# Patient Record
Sex: Female | Born: 1935 | Race: White | Hispanic: No | Marital: Single | Smoking: Never smoker
Health system: Southern US, Community
[De-identification: ages and names within clinical notes are randomized; demographics above are authoritative.]

## PROBLEM LIST (undated history)

## (undated) DIAGNOSIS — E079 Disorder of thyroid, unspecified: Secondary | ICD-10-CM

## (undated) DIAGNOSIS — C801 Malignant (primary) neoplasm, unspecified: Secondary | ICD-10-CM

## (undated) DIAGNOSIS — M5136 Other intervertebral disc degeneration, lumbar region: Secondary | ICD-10-CM

## (undated) DIAGNOSIS — I2510 Atherosclerotic heart disease of native coronary artery without angina pectoris: Secondary | ICD-10-CM

## (undated) DIAGNOSIS — N183 Chronic kidney disease, stage 3 unspecified: Secondary | ICD-10-CM

## (undated) DIAGNOSIS — I509 Heart failure, unspecified: Secondary | ICD-10-CM

## (undated) DIAGNOSIS — K219 Gastro-esophageal reflux disease without esophagitis: Secondary | ICD-10-CM

## (undated) DIAGNOSIS — H9190 Unspecified hearing loss, unspecified ear: Secondary | ICD-10-CM

## (undated) DIAGNOSIS — E785 Hyperlipidemia, unspecified: Secondary | ICD-10-CM

## (undated) DIAGNOSIS — N189 Chronic kidney disease, unspecified: Secondary | ICD-10-CM

## (undated) DIAGNOSIS — I10 Essential (primary) hypertension: Secondary | ICD-10-CM

## (undated) DIAGNOSIS — M48061 Spinal stenosis, lumbar region without neurogenic claudication: Secondary | ICD-10-CM

## (undated) DIAGNOSIS — J40 Bronchitis, not specified as acute or chronic: Secondary | ICD-10-CM

## (undated) DIAGNOSIS — I499 Cardiac arrhythmia, unspecified: Secondary | ICD-10-CM

## (undated) DIAGNOSIS — I700 Atherosclerosis of aorta: Secondary | ICD-10-CM

## (undated) DIAGNOSIS — D34 Benign neoplasm of thyroid gland: Secondary | ICD-10-CM

## (undated) DIAGNOSIS — C8590 Non-Hodgkin lymphoma, unspecified, unspecified site: Secondary | ICD-10-CM

## (undated) DIAGNOSIS — R600 Localized edema: Secondary | ICD-10-CM

## (undated) DIAGNOSIS — J449 Chronic obstructive pulmonary disease, unspecified: Secondary | ICD-10-CM

## (undated) DIAGNOSIS — F419 Anxiety disorder, unspecified: Secondary | ICD-10-CM

## (undated) DIAGNOSIS — I4891 Unspecified atrial fibrillation: Secondary | ICD-10-CM

## (undated) DIAGNOSIS — F32A Depression, unspecified: Secondary | ICD-10-CM

## (undated) DIAGNOSIS — R32 Unspecified urinary incontinence: Secondary | ICD-10-CM

## (undated) DIAGNOSIS — R413 Other amnesia: Secondary | ICD-10-CM

## (undated) DIAGNOSIS — E039 Hypothyroidism, unspecified: Secondary | ICD-10-CM

## (undated) DIAGNOSIS — M51369 Other intervertebral disc degeneration, lumbar region without mention of lumbar back pain or lower extremity pain: Secondary | ICD-10-CM

## (undated) DIAGNOSIS — M1990 Unspecified osteoarthritis, unspecified site: Secondary | ICD-10-CM

## (undated) HISTORY — PX: ABDOMINAL HYSTERECTOMY: SHX81

## (undated) HISTORY — PX: TOTAL ABDOMINAL HYSTERECTOMY: SHX209

## (undated) HISTORY — PX: CORONARY STENT PLACEMENT: SHX1402

## (undated) HISTORY — PX: VEIN SURGERY: SHX48

## (undated) HISTORY — PX: CORONARY ANGIOPLASTY: SHX604

---

## 2000-02-26 DIAGNOSIS — C8590 Non-Hodgkin lymphoma, unspecified, unspecified site: Secondary | ICD-10-CM

## 2000-02-26 HISTORY — DX: Non-Hodgkin lymphoma, unspecified, unspecified site: C85.90

## 2003-12-12 ENCOUNTER — Ambulatory Visit: Payer: Self-pay | Admitting: Oncology

## 2003-12-27 ENCOUNTER — Ambulatory Visit: Payer: Self-pay | Admitting: Oncology

## 2004-03-02 ENCOUNTER — Ambulatory Visit: Payer: Self-pay | Admitting: Oncology

## 2004-03-14 ENCOUNTER — Ambulatory Visit: Payer: Self-pay | Admitting: Oncology

## 2004-06-13 ENCOUNTER — Ambulatory Visit: Payer: Self-pay | Admitting: Oncology

## 2004-06-25 ENCOUNTER — Ambulatory Visit: Payer: Self-pay | Admitting: Oncology

## 2004-09-12 ENCOUNTER — Ambulatory Visit: Payer: Self-pay | Admitting: Oncology

## 2004-09-25 ENCOUNTER — Ambulatory Visit: Payer: Self-pay | Admitting: Oncology

## 2005-02-22 ENCOUNTER — Ambulatory Visit: Payer: Self-pay | Admitting: Oncology

## 2005-03-05 ENCOUNTER — Ambulatory Visit: Payer: Self-pay | Admitting: Oncology

## 2005-03-28 ENCOUNTER — Ambulatory Visit: Payer: Self-pay | Admitting: Oncology

## 2005-09-02 ENCOUNTER — Ambulatory Visit: Payer: Self-pay | Admitting: Oncology

## 2005-09-25 ENCOUNTER — Ambulatory Visit: Payer: Self-pay | Admitting: Oncology

## 2006-02-21 ENCOUNTER — Ambulatory Visit: Payer: Self-pay | Admitting: Oncology

## 2006-03-04 ENCOUNTER — Ambulatory Visit: Payer: Self-pay | Admitting: Oncology

## 2006-03-28 ENCOUNTER — Ambulatory Visit: Payer: Self-pay | Admitting: Oncology

## 2006-08-26 ENCOUNTER — Ambulatory Visit: Payer: Self-pay | Admitting: Oncology

## 2006-09-09 ENCOUNTER — Ambulatory Visit: Payer: Self-pay | Admitting: Oncology

## 2006-09-26 ENCOUNTER — Ambulatory Visit: Payer: Self-pay | Admitting: Oncology

## 2006-10-21 ENCOUNTER — Ambulatory Visit: Payer: Self-pay | Admitting: Gastroenterology

## 2007-02-26 ENCOUNTER — Ambulatory Visit: Payer: Self-pay | Admitting: Oncology

## 2007-03-13 ENCOUNTER — Ambulatory Visit: Payer: Self-pay | Admitting: Internal Medicine

## 2007-03-18 ENCOUNTER — Ambulatory Visit: Payer: Self-pay | Admitting: Oncology

## 2007-03-29 ENCOUNTER — Ambulatory Visit: Payer: Self-pay | Admitting: Oncology

## 2007-05-12 ENCOUNTER — Ambulatory Visit: Payer: Self-pay | Admitting: Oncology

## 2007-05-13 ENCOUNTER — Ambulatory Visit: Payer: Self-pay | Admitting: Internal Medicine

## 2007-05-27 ENCOUNTER — Ambulatory Visit: Payer: Self-pay | Admitting: Internal Medicine

## 2007-06-26 ENCOUNTER — Ambulatory Visit: Payer: Self-pay | Admitting: Oncology

## 2007-08-17 ENCOUNTER — Ambulatory Visit: Payer: Self-pay | Admitting: Family Medicine

## 2007-09-26 ENCOUNTER — Ambulatory Visit: Payer: Self-pay | Admitting: Oncology

## 2007-10-02 ENCOUNTER — Ambulatory Visit: Payer: Self-pay | Admitting: Oncology

## 2007-10-27 ENCOUNTER — Ambulatory Visit: Payer: Self-pay | Admitting: Oncology

## 2008-03-28 ENCOUNTER — Ambulatory Visit: Payer: Self-pay | Admitting: Oncology

## 2008-04-04 ENCOUNTER — Ambulatory Visit: Payer: Self-pay | Admitting: Oncology

## 2008-04-13 ENCOUNTER — Ambulatory Visit: Payer: Self-pay | Admitting: Family Medicine

## 2008-04-25 ENCOUNTER — Ambulatory Visit: Payer: Self-pay | Admitting: Oncology

## 2008-05-17 ENCOUNTER — Inpatient Hospital Stay: Payer: Self-pay | Admitting: Internal Medicine

## 2008-06-03 ENCOUNTER — Inpatient Hospital Stay: Payer: Self-pay | Admitting: Internal Medicine

## 2008-06-03 DIAGNOSIS — I2510 Atherosclerotic heart disease of native coronary artery without angina pectoris: Secondary | ICD-10-CM

## 2008-06-03 HISTORY — DX: Atherosclerotic heart disease of native coronary artery without angina pectoris: I25.10

## 2008-06-03 HISTORY — PX: CORONARY ANGIOPLASTY WITH STENT PLACEMENT: SHX49

## 2008-09-25 ENCOUNTER — Ambulatory Visit: Payer: Self-pay | Admitting: Oncology

## 2008-10-03 ENCOUNTER — Ambulatory Visit: Payer: Self-pay | Admitting: Oncology

## 2008-10-26 ENCOUNTER — Ambulatory Visit: Payer: Self-pay | Admitting: Oncology

## 2009-10-02 ENCOUNTER — Ambulatory Visit: Payer: Self-pay | Admitting: Family Medicine

## 2010-02-08 ENCOUNTER — Inpatient Hospital Stay: Payer: Self-pay | Admitting: Internal Medicine

## 2010-02-12 HISTORY — PX: LEFT HEART CATH AND CORONARY ANGIOGRAPHY: CATH118249

## 2010-02-16 ENCOUNTER — Ambulatory Visit: Payer: Self-pay | Admitting: Oncology

## 2010-02-25 ENCOUNTER — Ambulatory Visit: Payer: Self-pay | Admitting: Oncology

## 2010-03-28 ENCOUNTER — Ambulatory Visit: Payer: Self-pay | Admitting: Oncology

## 2010-06-19 ENCOUNTER — Ambulatory Visit: Payer: Self-pay | Admitting: Internal Medicine

## 2012-08-02 LAB — URINALYSIS, COMPLETE
Bilirubin,UR: NEGATIVE
Glucose,UR: NEGATIVE mg/dL (ref 0–75)
Ketone: NEGATIVE
Nitrite: POSITIVE
Ph: 6 (ref 4.5–8.0)
RBC,UR: 99 /HPF (ref 0–5)
Squamous Epithelial: NONE SEEN

## 2012-08-02 LAB — CBC WITH DIFFERENTIAL/PLATELET
Basophil %: 0.5 %
Eosinophil #: 0.1 10*3/uL (ref 0.0–0.7)
Eosinophil %: 0.8 %
Lymphocyte %: 15.5 %
MCHC: 33.9 g/dL (ref 32.0–36.0)
MCV: 92 fL (ref 80–100)
Monocyte #: 1.3 x10 3/mm — ABNORMAL HIGH (ref 0.2–0.9)
Monocyte %: 11.7 %
Neutrophil #: 7.9 10*3/uL — ABNORMAL HIGH (ref 1.4–6.5)
Neutrophil %: 71.5 %
RBC: 4 10*6/uL (ref 3.80–5.20)
RDW: 14.3 % (ref 11.5–14.5)
WBC: 11.1 10*3/uL — ABNORMAL HIGH (ref 3.6–11.0)

## 2012-08-02 LAB — COMPREHENSIVE METABOLIC PANEL
Alkaline Phosphatase: 78 U/L (ref 50–136)
Bilirubin,Total: 0.5 mg/dL (ref 0.2–1.0)
Calcium, Total: 8.7 mg/dL (ref 8.5–10.1)
Chloride: 105 mmol/L (ref 98–107)
Glucose: 83 mg/dL (ref 65–99)
Osmolality: 277 (ref 275–301)
Potassium: 4.3 mmol/L (ref 3.5–5.1)
Sodium: 138 mmol/L (ref 136–145)

## 2012-08-02 LAB — TROPONIN I: Troponin-I: <0.02 ng/mL

## 2012-08-03 ENCOUNTER — Inpatient Hospital Stay: Payer: Self-pay | Admitting: Internal Medicine

## 2012-08-03 LAB — TROPONIN I: Troponin-I: <0.02 ng/mL

## 2012-08-04 LAB — BASIC METABOLIC PANEL
BUN: 17 mg/dL (ref 7–18)
Calcium, Total: 8.5 mg/dL (ref 8.5–10.1)
Chloride: 108 mmol/L — ABNORMAL HIGH (ref 98–107)
Co2: 24 mmol/L (ref 21–32)
Creatinine: 1.35 mg/dL — ABNORMAL HIGH (ref 0.60–1.30)
EGFR (African American): 44 — ABNORMAL LOW
EGFR (Non-African Amer.): 38 — ABNORMAL LOW
Osmolality: 280 (ref 275–301)
Potassium: 3.9 mmol/L (ref 3.5–5.1)

## 2012-08-04 LAB — CBC WITH DIFFERENTIAL/PLATELET
Eosinophil #: 0.3 10*3/uL (ref 0.0–0.7)
Eosinophil %: 3.5 %
HCT: 35 % (ref 35.0–47.0)
Lymphocyte #: 1.3 10*3/uL (ref 1.0–3.6)
MCH: 30.8 pg (ref 26.0–34.0)
MCV: 92 fL (ref 80–100)
Monocyte %: 12.4 %
Neutrophil #: 5.1 10*3/uL (ref 1.4–6.5)
Neutrophil %: 66.4 %
Platelet: 248 10*3/uL (ref 150–440)
RBC: 3.79 10*6/uL — ABNORMAL LOW (ref 3.80–5.20)
RDW: 13.9 % (ref 11.5–14.5)
WBC: 7.7 10*3/uL (ref 3.6–11.0)

## 2012-08-04 LAB — URINE CULTURE

## 2012-08-05 LAB — BASIC METABOLIC PANEL
Anion Gap: 6 — ABNORMAL LOW (ref 7–16)
BUN: 19 mg/dL — ABNORMAL HIGH (ref 7–18)
Calcium, Total: 8.9 mg/dL (ref 8.5–10.1)
Creatinine: 1.43 mg/dL — ABNORMAL HIGH (ref 0.60–1.30)
EGFR (Non-African Amer.): 35 — ABNORMAL LOW
Glucose: 81 mg/dL (ref 65–99)
Potassium: 3.6 mmol/L (ref 3.5–5.1)
Sodium: 139 mmol/L (ref 136–145)

## 2012-08-08 LAB — CULTURE, BLOOD (SINGLE)

## 2012-10-07 ENCOUNTER — Ambulatory Visit: Payer: Self-pay | Admitting: Internal Medicine

## 2012-10-12 ENCOUNTER — Emergency Department: Payer: Self-pay | Admitting: Emergency Medicine

## 2012-10-12 LAB — COMPREHENSIVE METABOLIC PANEL
Albumin: 3.8 g/dL (ref 3.4–5.0)
Anion Gap: 3 — ABNORMAL LOW (ref 7–16)
Chloride: 103 mmol/L (ref 98–107)
Co2: 30 mmol/L (ref 21–32)
EGFR (African American): 43 — ABNORMAL LOW
EGFR (Non-African Amer.): 37 — ABNORMAL LOW
Glucose: 105 mg/dL — ABNORMAL HIGH (ref 65–99)
Osmolality: 275 (ref 275–301)
Potassium: 3.7 mmol/L (ref 3.5–5.1)
Total Protein: 7.5 g/dL (ref 6.4–8.2)

## 2012-10-12 LAB — CBC
HCT: 41 % (ref 35.0–47.0)
HGB: 14.4 g/dL (ref 12.0–16.0)
MCH: 31.8 pg (ref 26.0–34.0)
MCHC: 35.2 g/dL (ref 32.0–36.0)
MCV: 90 fL (ref 80–100)
Platelet: 238 10*3/uL (ref 150–440)
RDW: 14.6 % — ABNORMAL HIGH (ref 11.5–14.5)
WBC: 8 10*3/uL (ref 3.6–11.0)

## 2013-10-06 ENCOUNTER — Ambulatory Visit: Payer: Self-pay | Admitting: Internal Medicine

## 2013-10-08 ENCOUNTER — Ambulatory Visit: Payer: Self-pay | Admitting: Internal Medicine

## 2013-11-03 ENCOUNTER — Ambulatory Visit: Payer: Self-pay | Admitting: Internal Medicine

## 2013-12-06 ENCOUNTER — Emergency Department: Payer: Self-pay | Admitting: Emergency Medicine

## 2013-12-06 LAB — CBC
HCT: 41.2 % (ref 35.0–47.0)
HGB: 13.3 g/dL (ref 12.0–16.0)
MCH: 31.5 pg (ref 26.0–34.0)
MCHC: 32.4 g/dL (ref 32.0–36.0)
MCV: 97 fL (ref 80–100)
Platelet: 250 10*3/uL (ref 150–440)
RBC: 4.23 10*6/uL (ref 3.80–5.20)
RDW: 14.2 % (ref 11.5–14.5)
WBC: 9.6 10*3/uL (ref 3.6–11.0)

## 2013-12-06 LAB — COMPREHENSIVE METABOLIC PANEL
ALBUMIN: 3.9 g/dL (ref 3.4–5.0)
Alkaline Phosphatase: 71 U/L
Anion Gap: 8 (ref 7–16)
BUN: 15 mg/dL (ref 7–18)
Bilirubin,Total: 0.6 mg/dL (ref 0.2–1.0)
Calcium, Total: 8.9 mg/dL (ref 8.5–10.1)
Chloride: 102 mmol/L (ref 98–107)
Co2: 27 mmol/L (ref 21–32)
Creatinine: 1.48 mg/dL — ABNORMAL HIGH (ref 0.60–1.30)
EGFR (African American): 44 — ABNORMAL LOW
GFR CALC NON AF AMER: 36 — AB
GLUCOSE: 107 mg/dL — AB (ref 65–99)
Osmolality: 275 (ref 275–301)
Potassium: 4 mmol/L (ref 3.5–5.1)
SGOT(AST): 19 U/L (ref 15–37)
SGPT (ALT): 30 U/L
Sodium: 137 mmol/L (ref 136–145)
Total Protein: 7.4 g/dL (ref 6.4–8.2)

## 2013-12-06 LAB — URINALYSIS, COMPLETE
BACTERIA: NONE SEEN
BILIRUBIN, UR: NEGATIVE
Blood: NEGATIVE
Glucose,UR: NEGATIVE mg/dL (ref 0–75)
Ketone: NEGATIVE
Leukocyte Esterase: NEGATIVE
Nitrite: NEGATIVE
Ph: 5 (ref 4.5–8.0)
Protein: NEGATIVE
RBC,UR: 1 /HPF (ref 0–5)
Specific Gravity: 1.002 (ref 1.003–1.030)
Squamous Epithelial: NONE SEEN
WBC UR: 1 /HPF (ref 0–5)

## 2013-12-13 DIAGNOSIS — M5116 Intervertebral disc disorders with radiculopathy, lumbar region: Secondary | ICD-10-CM | POA: Insufficient documentation

## 2013-12-13 DIAGNOSIS — M5416 Radiculopathy, lumbar region: Secondary | ICD-10-CM | POA: Insufficient documentation

## 2013-12-13 DIAGNOSIS — M48061 Spinal stenosis, lumbar region without neurogenic claudication: Secondary | ICD-10-CM | POA: Insufficient documentation

## 2013-12-24 ENCOUNTER — Ambulatory Visit: Payer: Self-pay | Admitting: Gastroenterology

## 2014-01-18 ENCOUNTER — Ambulatory Visit: Payer: Self-pay | Admitting: Oncology

## 2014-01-24 DIAGNOSIS — J209 Acute bronchitis, unspecified: Secondary | ICD-10-CM | POA: Insufficient documentation

## 2014-01-24 DIAGNOSIS — J208 Acute bronchitis due to other specified organisms: Secondary | ICD-10-CM | POA: Insufficient documentation

## 2014-05-31 DIAGNOSIS — I509 Heart failure, unspecified: Secondary | ICD-10-CM

## 2014-05-31 HISTORY — DX: Heart failure, unspecified: I50.9

## 2014-06-17 NOTE — Discharge Summary (Signed)
PATIENT NAME:  [name], [name] MR#:  [id number] DATE OF BIRTH:  23-Oct-1932  DATE OF ADMISSION:  08/03/2012 DATE OF DISCHARGE:  08/05/2012  DIAGNOSES AT TIME OF DISCHARGE:  1. Altered mental status secondary to a urinary tract infection.  2. Chest pain.  3. Depression.  4. Lymphoma.  5. Acute renal insufficiency secondary to dehydration.   CHIEF COMPLAINT: Chest pain, frequency of urination, back pain, confusion, difficulty sleeping.   HISTORY OF PRESENT ILLNESS: [name] is a 81 year old female who reported to the ER after she complained of inability to sleep associated with back pain, frequency of urination and chest pain. She was also noted to be febrile and confused and did not appear to be herself and reportedly was dropping things in the lobby and was disoriented. The patient states that she had been urinating a lot and had recently stopped her diuretics. The urinalysis was consistent with a urinary tract infection, and she was admitted for IV hydration and IV antibiotics.   PAST MEDICAL HISTORY: Significant for non-Hodgkin's lymphoma, hypothyroidism.   SOCIAL HISTORY: She is a nonsmoker. No history of alcohol use.   PHYSICAL EXAMINATION:  VITAL SIGNS: Blood pressure 116/54, respirations 18, pulse was 82, temperature was 101.2. GENERAL: She appeared restless, confused, otherwise not in distress.  HEENT: [place]/AT. Mucous membranes were dry. Pupils equally reactive to light.  NECK: Supple. Trachea was midline.  HEART: S1, S2.  LUNGS: Clear to auscultation.  ABDOMEN: Soft, nontender.  EXTREMITIES: No edema.  NEUROLOGIC: Nonfocal. The patient appeared to be restless, confused and somewhat disoriented.   LABORATORY STUDIES: EKG showed normal sinus rhythm. Glucose was 83, BUN 19, creatinine 1.5, sodium 138, potassium 4.5. LFTs were normal. CPK was 55, troponin less than 0.02. Hemoglobin 12, hematocrit 36, platelets 264. UA was cloudy with 659 WBCs, 3+ bacteria, 3+ leukocyte esterase and  nitrites were positive. Urine culture grew more than 100,000 CFU/mL E. coli that was sensitive to Rocephin, Cipro, gentamicin, imipenem, cefoxitin and Levaquin. The patient was started on IV Rocephin. During her stay in the hospital, her mental status gradually improved. She did have some headache that has also resolved, fever has gradually trended down, and renal function also improved to some extent. She was seen by physical therapy and did well with ambulation, and she was discharged in stable condition on the following medications.   DISCHARGE MEDICATIONS:  1. Cipro 250 mg p.o. b.i.d. for 7 more days.  2. Escitalopram 10 mg once a day.  3. Levothyroxine 25 mcg a day.  4. Furosemide 40 mg once a day.   DISCHARGE INSTRUCTIONS: The patient has been advised to follow with me, Dr. Ginette Pitman, in 1 to 2 weeks, and advised to drink plenty of fluids and to have a CBC and metabolic panel done at that time.   TOTAL TIME SPENT: 35 minutes.   ____________________________ Tracie Harrier, MD vh:OSi D: 08/06/2012 12:57:37 ET T: 08/06/2012 13:23:11 ET JOB#: [id number]  cc: Tracie Harrier, MD, <Dictator> Tracie Harrier MD ELECTRONICALLY SIGNED 08/19/2012 13:13

## 2014-06-17 NOTE — H&P (Signed)
PATIENT NAME:  [name], [name] MR#:  [id number] DATE OF BIRTH:  07-22-1932  DATE OF ADMISSION:  08/03/2012  PRIMARY CARE PHYSICIAN: Dr. Geralyn Corwin.   REFERRING PHYSICIAN: Pollie Friar.   CHIEF COMPLAINT: She has multiple complaints; unable to sleep for 2 days, back pain, chest pain and frequency of urination.   HISTORY OF PRESENT ILLNESS: [name] is a 81 year old Caucasian female who was in her usual state of health but presented today to the Emergency Department complaining that she is unable to sleep for the last 2 nights, she has some back pain, frequency of urination and chest pain. The patient was found to be febrile as well. The patient noticed to be also confused. She is not herself. She did not register when she was waiting at the Emergency Department and she was noticed sitting there dropping things in the lobby and could not say why she was here. She also stated that she stopped her fluid pill because she is urinating too much. Evaluation here in the Emergency Department is consistent with the presence of urinary tract infection. The patient was admitted to the hospital for further evaluation and treatment.   REVIEW OF SYSTEMS:   CONSTITUTIONAL: She is unsure if she had fever. No chills. She has mild fatigue.  EYES: No blurring of vision. No double vision.  ENT: No hearing impairment. No sore throat. No dysphagia.  CARDIOVASCULAR: She does report chest pain today. No cough or hemoptysis. No syncope.  RESPIRATORY: Reports chest pain vaguely, described as just aching. She is not focused in her answers and does not keep in eye contact.  GASTROINTESTINAL: Denies any abdominal pain. No vomiting. No diarrhea.  GENITOURINARY: Reports frequency of urination in the last few days. No dysuria.  MUSCULOSKELETAL: Reports back pain, mostly towards the right. No joint pain otherwise. No muscular pain or swelling.  INTEGUMENTARY: No skin rash. No ulcers.  NEUROLOGY: No focal weakness. No seizure  activity. No headache, but she appears somewhat altered in her mental status and somewhat confused.  PSYCHIATRY: No anxiety, but she has history of depression.  ENDOCRINE: No polyuria or polydipsia. No heat or cold intolerance.   PAST MEDICAL HISTORY: Last admission to this hospital was in December 2011. At that time, she had chest pain. Had cardiac cath that was negative. History of pneumonia at that admission. History of non-Hodgkin lymphoma. This is followed up by Dr. Oliva Bustard. Depression. Hypothyroidism.   FAMILY HISTORY: Both parents are deceased. She thinks they died from heart attack. She has 3 brothers who also had heart attacks.   SOCIAL HABITS: Nonsmoker, never smoked. No history of alcohol abuse.   SOCIAL HISTORY: She is single. She tells me she never married.   PAST SURGICAL HISTORY: History of cardiac stent implant and history of hysterectomy.   ADMISSION MEDICATIONS: Lexapro 10 mg a day, Levothroid 25 mcg once a day, furosemide 40 mg a day, etodolac 500 mg twice a day.   ALLERGIES: SULFA.   PHYSICAL EXAMINATION:  VITAL SIGNS: Blood pressure 116/54, respiratory rate 18, pulse 82, temperature was 101.2, subsequent temperature was 99.5. Oxygen saturation 95%.  GENERAL APPEARANCE: Elderly female who appears restless and somewhat confused in the bed. Otherwise in no acute distress.  HEAD AND NECK: No pallor. No icterus. No cyanosis. Ear examination revealed normal hearing, no discharge, no ulcers. Examination of the nose showed no discharge, no bleeding. Oropharyngeal examination revealed normal lips and tongue. No oral thrush. She is edentulous. Has upper dentures only. Eye examination revealed  normal eyelids and conjunctivae. Pupils about 4 to 5 mm, round, equal and reactive to light. Neck is supple. Trachea at midline. No thyromegaly. No cervical lymphadenopathy. No masses.  HEART: Normal S1, S2. No S3, S4. No murmur. No gallop. No carotid bruits.  RESPIRATORY: Normal breathing  pattern without use of accessory muscles. No rales. No wheezing.  ABDOMEN: Soft without tenderness. No hepatosplenomegaly. No masses. No hernias.  SKIN: No ulcers. No subcutaneous nodules.  MUSCULOSKELETAL: No joint swelling. No clubbing.  NEUROLOGIC: Cranial nerves II through XII were intact. No focal motor deficit.  PSYCHIATRIC: The patient appears to be slightly restless and confused. Oriented that this is a hospital.   LABORATORY FINDINGS: Chest x-ray showed no acute cardiopulmonary abnormalities. Her EKG showed normal sinus rhythm at rate of 88 per minute. Otherwise unremarkable EKG. Her serum glucose 83, BUN 19, creatinine 1.5, sodium 138, potassium 4.3. Normal liver function tests and liver transaminases. CPK 55. Troponin less than 0.02. CBC showed white count of 11,000, hemoglobin 12, hematocrit 36, platelet count 264. Urine appeared to be cloudy, 659 white blood cells, +3 bacteria, +3 leukocyte esterase and nitrite positive.   ASSESSMENT:  1. Acute pyelonephritis.  2. Altered mental status, likely secondary to the urinary tract infection.  3. Chest pain, unclear etiology. The patient is not very descriptive about the pain.  4. Depression.  5. Past history of lymphoma.   PLAN: Will admit the patient to the medical floor. Blood cultures x2 and urine culture. IV Rocephin started. Gentle IV hydration. Continue home medications as listed above. I will place her on telemetry. Will consider one-to-one sitter to keep an eye on her. P.r.n. sedation if needed. I cannot obtain from her information about Living Will or the code status. Therefore, she will be FULL CODE. For deep vein thrombosis prophylaxis, I will place her on TED stockings and compression. Will avoid Lovenox or anticoagulation since she is at risk for fall given her altered mental status and she appears to be restless.   Time spent in evaluating this patient took more than 55 minutes.   ____________________________ Clovis Pu. Lenore Manner,  MD amd:gb D: 08/03/2012 02:01:49 ET T: 08/03/2012 03:09:22 ET JOB#: [id number]  cc: Clovis Pu. Lenore Manner, MD, <Dictator> Mike Craze Irven Coe MD ELECTRONICALLY SIGNED 08/14/2012 3:14

## 2014-06-18 ENCOUNTER — Observation Stay
Admit: 2014-06-18 | Disposition: A | Discharge status: Home / Self Care | Payer: Self-pay | Attending: Internal Medicine | Admitting: Internal Medicine

## 2014-06-18 LAB — CBC
HCT: 39.8 % (ref 35.0–47.0)
HGB: 13.1 g/dL (ref 12.0–16.0)
MCH: 31.1 pg (ref 26.0–34.0)
MCHC: 32.9 g/dL (ref 32.0–36.0)
MCV: 95 fL (ref 80–100)
PLATELETS: 209 10*3/uL (ref 150–440)
RBC: 4.22 10*6/uL (ref 3.80–5.20)
RDW: 13.6 % (ref 11.5–14.5)
WBC: 11.3 10*3/uL — ABNORMAL HIGH (ref 3.6–11.0)

## 2014-06-18 LAB — HEPATIC FUNCTION PANEL A (ARMC)
Albumin: 3.3 g/dL — ABNORMAL LOW
Alkaline Phosphatase: 59 U/L
Bilirubin, Direct: 0.2 mg/dL
Bilirubin,Total: 0.9 mg/dL
Indirect Bilirubin: 0.7
SGOT(AST): 16 U/L
SGPT (ALT): 14 U/L
Total Protein: 6.5 g/dL

## 2014-06-18 LAB — PRO B NATRIURETIC PEPTIDE: B-Type Natriuretic Peptide: 142 pg/mL — ABNORMAL HIGH

## 2014-06-18 LAB — CK-MB
CK-MB: 3.2 ng/mL
CK-MB: 2.3 ng/mL

## 2014-06-18 LAB — BASIC METABOLIC PANEL
ANION GAP: 13 (ref 7–16)
BUN: 14 mg/dL
Calcium, Total: 9 mg/dL
Chloride: 106 mmol/L
Co2: 21 mmol/L — ABNORMAL LOW
Creatinine: 1.11 mg/dL — ABNORMAL HIGH
EGFR (African American): 55 — ABNORMAL LOW
EGFR (Non-African Amer.): 47 — ABNORMAL LOW
Glucose: 115 mg/dL — ABNORMAL HIGH
Potassium: 4.7 mmol/L
SODIUM: 140 mmol/L

## 2014-06-18 LAB — D-DIMER(ARMC): D-DIMER: 1451 ng/mL

## 2014-06-18 LAB — TROPONIN I
TROPONIN-I: 0.04 ng/mL — AB
Troponin-I: <0.03 ng/mL
Troponin-I: <0.03 ng/mL

## 2014-06-18 LAB — CK TOTAL AND CKMB (NOT AT ARMC)
CK, Total: 39 U/L
CK-MB: 2.1 ng/mL

## 2014-06-19 LAB — COMPREHENSIVE METABOLIC PANEL
ALK PHOS: 62 U/L
ANION GAP: 7 (ref 7–16)
AST: 17 U/L
Albumin: 3.6 g/dL
BILIRUBIN TOTAL: 0.9 mg/dL
BUN: 17 mg/dL
CALCIUM: 9 mg/dL
CO2: 29 mmol/L
Chloride: 104 mmol/L
Creatinine: 1.31 mg/dL — ABNORMAL HIGH
EGFR (African American): 45 — ABNORMAL LOW
EGFR (Non-African Amer.): 39 — ABNORMAL LOW
Glucose: 106 mg/dL — ABNORMAL HIGH
POTASSIUM: 3.5 mmol/L
SGPT (ALT): 13 U/L — ABNORMAL LOW
Sodium: 140 mmol/L
Total Protein: 7.1 g/dL

## 2014-06-19 LAB — FOLATE: FOLIC ACID: 20.5 ng/mL

## 2014-06-19 LAB — TSH: Thyroid Stimulating Horm: 1.428 u[IU]/mL

## 2014-06-20 LAB — SURGICAL PATHOLOGY

## 2014-06-20 LAB — BASIC METABOLIC PANEL
Anion Gap: 7 (ref 7–16)
BUN: 19 mg/dL
Calcium, Total: 8.9 mg/dL
Chloride: 105 mmol/L
Co2: 27 mmol/L
Creatinine: 1.14 mg/dL — ABNORMAL HIGH
GFR CALC AF AMER: 53 — AB
GFR CALC NON AF AMER: 46 — AB
Glucose: 96 mg/dL
Potassium: 3.7 mmol/L
Sodium: 139 mmol/L

## 2014-06-21 LAB — BASIC METABOLIC PANEL
Anion Gap: 4 — ABNORMAL LOW (ref 7–16)
BUN: 21 mg/dL — AB
CHLORIDE: 107 mmol/L
CREATININE: 1.22 mg/dL — AB
Calcium, Total: 8.6 mg/dL — ABNORMAL LOW
Co2: 27 mmol/L
EGFR (African American): 49 — ABNORMAL LOW
EGFR (Non-African Amer.): 42 — ABNORMAL LOW
Glucose: 100 mg/dL — ABNORMAL HIGH
Potassium: 3.9 mmol/L
Sodium: 138 mmol/L

## 2014-06-21 LAB — CBC WITH DIFFERENTIAL/PLATELET
BASOS ABS: 0 10*3/uL (ref 0.0–0.1)
Basophil %: 0.5 %
Eosinophil #: 0.3 10*3/uL (ref 0.0–0.7)
Eosinophil %: 4.5 %
HCT: 37.2 % (ref 35.0–47.0)
HGB: 12.4 g/dL (ref 12.0–16.0)
LYMPHS PCT: 40.2 %
Lymphocyte #: 2.7 10*3/uL (ref 1.0–3.6)
MCH: 31.1 pg (ref 26.0–34.0)
MCHC: 33.4 g/dL (ref 32.0–36.0)
MCV: 93 fL (ref 80–100)
Monocyte #: 0.8 x10 3/mm (ref 0.2–0.9)
Monocyte %: 12.6 %
Neutrophil #: 2.8 10*3/uL (ref 1.4–6.5)
Neutrophil %: 42.2 %
PLATELETS: 244 10*3/uL (ref 150–440)
RBC: 3.99 10*6/uL (ref 3.80–5.20)
RDW: 13.9 % (ref 11.5–14.5)
WBC: 6.6 10*3/uL (ref 3.6–11.0)

## 2014-06-26 NOTE — H&P (Addendum)
PATIENT NAME:  [name], [name] MR#:  [id number] DATE OF BIRTH:  1932-09-04  DATE OF ADMISSION:  06/18/2014  PRIMARY CARE PHYSICIAN: Tracie Harrier, MD  HISTORY OF PRESENT ILLNESS: The patient is a 81 year old, Caucasian female with history of non-Hodgkin's lymphoma, history of depression, hypothyroidism, coronary artery disease status post stent 5 to 10 years ago, who presented to the hospital complaining of shortness of breath as well as chest pressure. According to the patient, she has been doing well. She was doing well up until approximately 1 month ago when she started having multiple medical problems including shortness of breath for the past 1 month. She tells me that this shortness of breath comes and goes, but mostly whenever she tries to lie down. She cannot lie down flat. She now sleeps in the chair. She also has been noticing some substernal, going to the left under her breast, chest pressure which is also lasting approximately a month. It is usually separate from the shortness of breath. It would come for a short period of time and it would go away. She cannot walk around the building, not stopping at least 5 or 6 times, although in the past she would walk and had no significant discomfort. She did not have any recent cardiac evaluation, according to her knowledge and she presented to the emergency room for further evaluation. In the emergency room, she was noted to have mild elevation of troponin, although her EKG is completely within normal limits. Hospitalist services were contacted for admission.   PAST MEDICAL HISTORY: Significant for history of non-Hodgkin's lymphoma, depression, hypothyroidism, coronary artery disease status post stent in unknown artery, history of gastroesophageal reflux disease without esophagitis, history of CHF and hyperlipidemia.   MEDICATIONS: Per medical records, the patient is on furosemide 40 mg p.o. daily, lovastatin 40 mg p.o. daily, pantoprazole 40 mg p.o.  daily, potassium chloride 10 mEq twice daily, promethazine 25 mg every 6 hours as needed   FAMILY HISTORY: Both of the patient's parents are deceased. She thinks they died of heart attack. The patient's 3 brothers had heart attacks. The patient's 2 brothers had diabetes mellitus.   ALLERGIES: SULFA DRUGS.   SOCIAL HISTORY: Nonsmoker. Smoked in the past for probably 1 year only and she thinks that she never inhaled. Quit 50 years ago. No history of alcohol abuse. Otherwise, she is widowed, lives at home alone. Her daughter lives close approximately 15 minutes away from her.   PAST SURGICAL HISTORY: Cardiac stent implant as well as history of hysterectomy.   REVIEW OF SYSTEMS:  CONSTITUTIONAL: Positive for fatigue and weakness for the past 1 month. Pain in the chest. Weight loss, but she is not sure how much. No appetite. Sinus congestion, dyspnea, especially orthopnea, chest pressure and intermittent cough as well as some intermittent wheezes, intermittent nausea and vomiting as well as diarrhea for the past 1 month on and off. Sore muscles all over her body. Otherwise, denies any high fevers, weight gain. ENT: Denies any tinnitus, allergies, epistaxis, sinus pain, dentures, difficulty swallowing.  RESPIRATORY: Denies any hemoptysis. Has shortness of breath, orthopnea.  CARDIOVASCULAR: Denies orthopnea, edema, arrhythmias, palpitations or syncope. GASTROINTESTINAL: Denies hematemesis, hematochezia or change in bowel habits.  GENITOURINARY: Denies dysuria, hematuria, frequency or incontinence. ENDOCRINOLOGY: Denies any polydipsia, nocturia, thyroid problems, heat or cold intolerance or thirst.  HEMATOLOGIC: Denies anemia, easy bruising or bleeding, swollen glands.  SKIN: Denies acne, rash, lesions or change in moles.  MUSCULOSKELETAL: Denies arthritis, cramps, swelling. NEUROLOGIC: Denies numbness, epilepsy  or tremor.  PSYCHIATRIC: Denies anxiety, insomnia or depression.  PHYSICAL  EXAMINATION: VITAL SIGNS: On arrival to the hospital: The patient's temperature was 98.7, pulse was 73, respiratory rate was 18, blood pressure 119/59, saturation was 98% on room air.  GENERAL: This is a well-developed, well-nourished, Caucasian female in no significant distress sitting on the stretcher.  HEENT: Her pupils are equal, reactive to light. Extraocular muscles intact, no icterus or conjunctivitis. Has normal hearing. No pharyngeal erythema. Mucosa is moist.  NECK: No masses. Supple, nontender. Thyroid is not enlarged. No adenopathy. No JVD or carotid bruits bilaterally. Full range of motion.  LUNGS: Clear to auscultation in all fields. No significant diminished breath sounds or wheezing. No labored inspirations, increased effort, dullness to percussion and not in overt respiratory distress.  CARDIOVASCULAR: S1, S2 appreciated. Rhythm is regular. PMI not lateralized. Chest is nontender to palpation. Pedal pulses 1+. Trace to 1+ lower extremity edema. No calf tenderness or cyanosis was noted.   ABDOMEN: Soft. Minimally tender in suprapubic area as well as right lower quadrant as well as mildly left lower quadrant but no rebound or guarding was noted. No hepatosplenomegaly or masses were noted.  RECTAL: Deferred.  MUSCLE STRENGTH: Able to move all extremities. No cyanosis, degenerative joint disease. Mild kyphosis. Gait was not tested.  SKIN: Did not reveal any rashes, lesions, erythema, nodularity or induration. It was warm and dry to palpation. LYMPHATIC: No adenopathy in the cervical region.  NEUROLOGIC: Cranial grossly intact. Sensory is intact. No dysarthria or aphasia. The patient is alert, oriented to time, person and place, cooperative. Memory is impaired but no significant confusion, agitation or depression noted.   LABORATORY DATA: Glucose elevated at 115, creatinine 1.11. The patient's B-type natriuretic peptide was 142. Otherwise, BMP was unremarkable. CO2 level is low at 21.  Troponin is elevated at 0.04. White blood cell count is elevated at 11.3, hemoglobin 13.1, platelet count was 209,000. EKG normal sinus rhythm at 74 beats per minute. Normal axis. No acute ST-T changes.   RADIOLOGIC STUDIES: Chest x-ray PA and lateral showed bibasilar atelectasis.   ASSESSMENT AND PLAN: 1. Chest pressure. Admit patient to the medical floor and start her on metoprolol, aspirin, nitroglycerin as well as heparin subcutaneously. Check cardiac enzymes x 3, get cardiologist involved as well as echocardiogram will be done.  2. Dyspnea, orthopnea, for congestive heart failure. We will get echocardiogram done to rule out cardiomyopathy.  3. Renal insufficiency, seems to be chronic, seems to be stable.  4. Elevated troponin. Check CK-MB fraction as well as CK total and follow cardiac enzymes x 3. Get cardiologist involved to rule out impending heart attack versus underlying severe coronary artery disease.  5. Elevated white blood cell count of unclear etiology at this time. Get sputum cultures and start antibiotic therapy if needed. For now, no antibiotics since the patient has no other symptoms of pneumonia.  6. Lower extremity swelling, could be a sign of congestive heart failure; however, rule out deep vein thrombosis with Doppler ultrasound.   TIME SPENT: 50 minutes on the patient.     ____________________________ Theodoro Grist, MD rv:TT D: 06/18/2014 13:38:58 ET T: 06/18/2014 14:08:56 ET JOB#: [id number]  cc: Theodoro Grist, MD, <Dictator> Tracie Harrier, MD Elara Cocke MD ELECTRONICALLY SIGNED 06/25/2014 20:19

## 2014-06-26 NOTE — Consult Note (Signed)
   Present Illness 82 year old, Caucasian female with history of non-Hodgkin???s lymphoma, history of depression, hypothyroidism, coronary artery disease status post stent 5 to 10 years ago, who presented to the hospital complaining of shortness of breath as well as chest pressure. Pt is a somewhat diffuse historian but states she has had difficulty sleeping over the past several weeks due to increased sob and chest tightness. She states she has been compliatn with her meds. Her ekg revealed nsr with no significant ischemia. She has ruled out for an mi with only trivial troponin abnormalities. She states she is better since admission. Echo revealed normal lv function with no wasll motion abnormalities. CXR revealed bibasilr atelectasis with no edema or airspace disease.   Physical Exam:  GEN well developed, well nourished   HEENT PERRL   NECK No masses   RESP normal resp effort  rhonchi   CARD Regular rate and rhythm  No murmur   ABD denies tenderness   LYMPH negative neck, negative axillae   EXTR negative cyanosis/clubbing, negative edema   SKIN normal to palpation   NEURO cranial nerves intact, motor/sensory function intact   PSYCH A+O to time, place, person   Review of Systems:  Subjective/Chief Complaint sob and insomnia   General: Fatigue   Skin: No Complaints   ENT: No Complaints   Eyes: No Complaints   Neck: No Complaints   Respiratory: Short of breath   Cardiovascular: Dyspnea   Gastrointestinal: No Complaints   Genitourinary: No Complaints   Vascular: No Complaints   Musculoskeletal: No Complaints   Neurologic: No Complaints   Hematologic: No Complaints   Endocrine: No Complaints   Psychiatric: No Complaints   Review of Systems: All other systems were reviewed and found to be negative   Medications/Allergies Reviewed Medications/Allergies reviewed   Family & Social History:  Family and Social History:  Social History positive tobacco (Greater  than 1 year)   + Tobacco Prior (greater than 1 year)   Place of Living Home   EKG:  EKG NSR   Abnormal NSSTTW changes    Sulfa drugs: Unknown   Impression 81 yo female with history of cad s/p pci in the distant past who was admitted after presenting with several days to weeks of sob and insomnia. Had mild chest tightness under her left breast. Has ruled out for an mi with trivial troponin elevation. EKG unremarkable. CXR unremarkable. Improved since admission but renal funciton worsened somewhat so will hold lasix today and follow. Will need evaluation for possible ischemia. WIll order funcitonal study for in am to evaluate for evidence of ischemia. Echo showed normal lv funciotnl. Will contineu with metoprolol and add asa   Plan 1. Hold lasix and follow renal funciton 2. Myoview in am 3. Add asa and continue metoprolol 4. Further recs after funcitonal study   Electronic Signatures: Teodoro Spray (MD)  (Signed 24-Apr-16 10:26)  Authored: General Aspect/Present Illness, History and Physical Exam, Review of System, Family & Social History, EKG , Allergies, Impression/Plan   Last Updated: 24-Apr-16 10:26 by Teodoro Spray (MD)

## 2014-06-27 NOTE — Discharge Summary (Signed)
PATIENT NAME:  [name], [name] MR#:  [id number] DATE OF BIRTH:  Aug 14, 1932  DATE OF ADMISSION:  06/18/2014 DATE OF DISCHARGE:  06/21/2014  DIAGNOSES AT TIME OF DISCHARGE:   1.  Chest pressure - most likely non cardiac, probably musculoskeletal.  2.  History of depression.  3.  History of congestive heart failure.  4.  History of hyperlipidemia.  5.  History of coronary artery disease,   CHIEF COMPLAINT: Shortness of breath, chest pressure.   HISTORY OF PRESENT ILLNESS: The patient is a 81 year old female with a history of non-Hodgkin's lymphoma, history of depression, hypothyroidism, coronary disease status post stent, presented to the hospital complaining of shortness of breath, as well as chest pressure. The patient states the shortness of breath comes and goes and is worse when she tries to lie down. The patient was noted to have a mild elevation of troponin on admission. Please see H and P for full details.   PHYSICAL EXAMINATION: VITAL SIGNS: Temperature was 98.7, pulse 73, respirations 18, blood pressure 119/59, oxygen saturation 98% on room air.  GENERAL: He was not in significant distress.  HEENT: [place].  HEART: S1, S2.  LUNGS: Clear.  ABDOMEN: Soft, nontender.  EXTREMITIES: No edema.   LABORATORY STUDIES: Glucose 115, creatinine 1.11, BNP was 142. Troponin was slightly elevated at 0.04. WBC count 11.3, hemoglobin 13.1, platelet count 209,000. EKG was normal. No evidence of any acute ST-T changes.   HOSPITAL COURSE: The patient was admitted to the hospital and underwent a lung VQ scan which showed normal perfusion without any evidence of pulmonary embolism. Her creatinine also improved to some extent with IV fluids. She was seen in consultation with cardiology, Dr. Ubaldo Glassing, and initially was due to have a nuclear medicine scan but this was subsequently canceled because of her recent VQ scan. She was ambulated and appeared to be stable. A venous Doppler did not show evidence of deep vein  thrombosis. Chest x-ray showed atelectasis without any acute cardiopulmonary disease. The patient remained chest pain free while in the hospital and was stable at the time of discharge.   DISCHARGE MEDICATIONS: She was discharged home on the following medications: Lovastatin 40 mg once a day, pantoprazole 40 mg once a day, promethazine 25 mg q. 6 p.r.n. for nausea and vomiting, gabapentin 300 mg p.o. b.i.d., aspirin 81 mg a day and acetaminophen 325 mg q. 4 to 6 hours p.r.n.   She has been advised to hold her furosemide and potassium for now and advised to call back with any questions or concerns and follow up in the clinic with me, Dr. Ginette Pitman, and also follow up with Dr. Ubaldo Glassing, cardiologist, as an outpatient. The patient was advised to call back if she has any recurrence of symptoms.   TOTAL TIME SPENT DISCHARGING THE PATIENT: 35 minutes.    ____________________________ Tracie Harrier, MD vh:at D: 06/24/2014 12:53:34 ET T: 06/24/2014 17:30:41 ET JOB#: [id number]  cc: Tracie Harrier, MD, <Dictator> Tracie Harrier MD ELECTRONICALLY SIGNED 06/24/2014 23:22

## 2014-07-08 DIAGNOSIS — R600 Localized edema: Secondary | ICD-10-CM | POA: Insufficient documentation

## 2014-07-23 ENCOUNTER — Emergency Department: Payer: Medicare Other

## 2014-07-23 ENCOUNTER — Encounter: Payer: Self-pay | Admitting: Emergency Medicine

## 2014-07-23 ENCOUNTER — Inpatient Hospital Stay
Admission: EM | Admit: 2014-07-23 | Discharge: 2014-07-28 | DRG: 392 | Disposition: A | Discharge status: Home / Self Care | Payer: Medicare Other | Attending: Surgery | Admitting: Surgery

## 2014-07-23 DIAGNOSIS — F419 Anxiety disorder, unspecified: Secondary | ICD-10-CM | POA: Diagnosis present

## 2014-07-23 DIAGNOSIS — E079 Disorder of thyroid, unspecified: Secondary | ICD-10-CM | POA: Diagnosis present

## 2014-07-23 DIAGNOSIS — Z8572 Personal history of non-Hodgkin lymphomas: Secondary | ICD-10-CM | POA: Diagnosis not present

## 2014-07-23 DIAGNOSIS — N321 Vesicointestinal fistula: Secondary | ICD-10-CM | POA: Diagnosis present

## 2014-07-23 DIAGNOSIS — R110 Nausea: Secondary | ICD-10-CM

## 2014-07-23 DIAGNOSIS — Z882 Allergy status to sulfonamides status: Secondary | ICD-10-CM

## 2014-07-23 DIAGNOSIS — F329 Major depressive disorder, single episode, unspecified: Secondary | ICD-10-CM | POA: Diagnosis present

## 2014-07-23 DIAGNOSIS — I480 Paroxysmal atrial fibrillation: Secondary | ICD-10-CM | POA: Diagnosis present

## 2014-07-23 DIAGNOSIS — N390 Urinary tract infection, site not specified: Secondary | ICD-10-CM | POA: Diagnosis present

## 2014-07-23 DIAGNOSIS — K5792 Diverticulitis of intestine, part unspecified, without perforation or abscess without bleeding: Secondary | ICD-10-CM | POA: Diagnosis not present

## 2014-07-23 DIAGNOSIS — Z90710 Acquired absence of both cervix and uterus: Secondary | ICD-10-CM

## 2014-07-23 DIAGNOSIS — E785 Hyperlipidemia, unspecified: Secondary | ICD-10-CM | POA: Diagnosis present

## 2014-07-23 DIAGNOSIS — R0600 Dyspnea, unspecified: Secondary | ICD-10-CM

## 2014-07-23 DIAGNOSIS — I2510 Atherosclerotic heart disease of native coronary artery without angina pectoris: Secondary | ICD-10-CM | POA: Diagnosis present

## 2014-07-23 DIAGNOSIS — K5732 Diverticulitis of large intestine without perforation or abscess without bleeding: Principal | ICD-10-CM | POA: Diagnosis present

## 2014-07-23 DIAGNOSIS — I10 Essential (primary) hypertension: Secondary | ICD-10-CM | POA: Diagnosis present

## 2014-07-23 HISTORY — DX: Unspecified atrial fibrillation: I48.91

## 2014-07-23 HISTORY — DX: Malignant (primary) neoplasm, unspecified: C80.1

## 2014-07-23 HISTORY — DX: Anxiety disorder, unspecified: F41.9

## 2014-07-23 HISTORY — DX: Hyperlipidemia, unspecified: E78.5

## 2014-07-23 HISTORY — DX: Atherosclerotic heart disease of native coronary artery without angina pectoris: I25.10

## 2014-07-23 HISTORY — DX: Disorder of thyroid, unspecified: E07.9

## 2014-07-23 LAB — CBC WITH DIFFERENTIAL/PLATELET
Basophils Absolute: 0.1 10*3/uL (ref 0–0.1)
Basophils Relative: 0 %
EOS ABS: 0.1 10*3/uL (ref 0–0.7)
EOS PCT: 0 %
HEMATOCRIT: 39.1 % (ref 35.0–47.0)
Hemoglobin: 13 g/dL (ref 12.0–16.0)
LYMPHS ABS: 1.7 10*3/uL (ref 1.0–3.6)
Lymphocytes Relative: 13 %
MCH: 30.7 pg (ref 26.0–34.0)
MCHC: 33.3 g/dL (ref 32.0–36.0)
MCV: 92.1 fL (ref 80.0–100.0)
Monocytes Absolute: 1.6 10*3/uL — ABNORMAL HIGH (ref 0.2–0.9)
Monocytes Relative: 12 %
Neutro Abs: 9.9 10*3/uL — ABNORMAL HIGH (ref 1.4–6.5)
Neutrophils Relative %: 75 %
PLATELETS: 259 10*3/uL (ref 150–440)
RBC: 4.24 MIL/uL (ref 3.80–5.20)
RDW: 13.5 % (ref 11.5–14.5)
WBC: 13.2 10*3/uL — ABNORMAL HIGH (ref 3.6–11.0)

## 2014-07-23 LAB — COMPREHENSIVE METABOLIC PANEL
ALT: 19 U/L (ref 14–54)
AST: 26 U/L (ref 15–41)
Albumin: 4 g/dL (ref 3.5–5.0)
Alkaline Phosphatase: 63 U/L (ref 38–126)
Anion gap: 13 (ref 5–15)
BUN: 13 mg/dL (ref 6–20)
CO2: 26 mmol/L (ref 22–32)
CREATININE: 1.3 mg/dL — AB (ref 0.44–1.00)
Calcium: 8.9 mg/dL (ref 8.9–10.3)
Chloride: 96 mmol/L — ABNORMAL LOW (ref 101–111)
GFR calc Af Amer: 44 mL/min — ABNORMAL LOW (ref >60)
GFR calc non Af Amer: 38 mL/min — ABNORMAL LOW (ref >60)
GLUCOSE: 130 mg/dL — AB (ref 65–99)
Potassium: 3.1 mmol/L — ABNORMAL LOW (ref 3.5–5.1)
SODIUM: 135 mmol/L (ref 135–145)
Total Bilirubin: 1.5 mg/dL — ABNORMAL HIGH (ref 0.3–1.2)
Total Protein: 7.4 g/dL (ref 6.5–8.1)

## 2014-07-23 LAB — URINALYSIS COMPLETE WITH MICROSCOPIC (ARMC ONLY)
Bilirubin Urine: NEGATIVE
Glucose, UA: NEGATIVE mg/dL
KETONES UR: NEGATIVE mg/dL
NITRITE: NEGATIVE
Protein, ur: NEGATIVE mg/dL
Specific Gravity, Urine: 1.009 (ref 1.005–1.030)
Trans Epithel, UA: 1
pH: 6 (ref 5.0–8.0)

## 2014-07-23 LAB — LACTIC ACID, PLASMA: Lactic Acid, Venous: 1 mmol/L (ref 0.5–2.0)

## 2014-07-23 LAB — TROPONIN I: Troponin I: <0.03 ng/mL (ref <0.031)

## 2014-07-23 LAB — LIPASE, BLOOD: Lipase: 29 U/L (ref 22–51)

## 2014-07-23 MED ORDER — ONDANSETRON HCL 4 MG PO TABS: 4.0000 mg | ORAL_TABLET | Freq: Four times a day (QID) | ORAL | Status: DC | PRN

## 2014-07-23 MED ORDER — ACETAMINOPHEN 650 MG RE SUPP: 650.0000 mg | Freq: Four times a day (QID) | RECTAL | Status: DC | PRN

## 2014-07-23 MED ORDER — ENOXAPARIN SODIUM 40 MG/0.4ML ~~LOC~~ SOLN
40.0000 mg | SUBCUTANEOUS | Status: DC
  Administered 2014-07-23 – 2014-07-27 (×5): 40 mg via SUBCUTANEOUS
  Filled 2014-07-23 (×5): qty 0.4

## 2014-07-23 MED ORDER — ALUM & MAG HYDROXIDE-SIMETH 200-200-20 MG/5ML PO SUSP: 30.0000 mL | Freq: Four times a day (QID) | ORAL | Status: DC | PRN

## 2014-07-23 MED ORDER — CEFTRIAXONE SODIUM IN DEXTROSE 40 MG/ML IV SOLN
2.0000 g | Freq: Two times a day (BID) | INTRAVENOUS | Status: DC
  Filled 2014-07-23 (×2): qty 50

## 2014-07-23 MED ORDER — SODIUM CHLORIDE 0.9 % IV SOLN
INTRAVENOUS | Status: DC
  Administered 2014-07-23: 16:00:00 via INTRAVENOUS

## 2014-07-23 MED ORDER — IOHEXOL 300 MG/ML  SOLN
75.0000 mL | Freq: Once | INTRAMUSCULAR | Status: AC | PRN
  Administered 2014-07-23: 75 mL via INTRAVENOUS

## 2014-07-23 MED ORDER — ACETAMINOPHEN 325 MG PO TABS
650.0000 mg | ORAL_TABLET | Freq: Four times a day (QID) | ORAL | Status: DC | PRN
Start: 2014-07-23 — End: 2014-07-28
  Filled 2014-07-23: qty 2

## 2014-07-23 MED ORDER — HYDROCODONE-ACETAMINOPHEN 5-325 MG PO TABS
1.0000 | ORAL_TABLET | ORAL | Status: DC | PRN
  Administered 2014-07-23: 2 via ORAL
  Administered 2014-07-26: 1 via ORAL
  Filled 2014-07-23: qty 1
  Filled 2014-07-23: qty 2

## 2014-07-23 MED ORDER — DEXTROSE IN LACTATED RINGERS 5 % IV SOLN
INTRAVENOUS | Status: DC
  Administered 2014-07-23 – 2014-07-26 (×8): via INTRAVENOUS

## 2014-07-23 MED ORDER — DEXTROSE 5 % IV SOLN
2.0000 g | Freq: Two times a day (BID) | INTRAVENOUS | Status: DC
  Administered 2014-07-23 – 2014-07-25 (×5): 2 g via INTRAVENOUS
  Filled 2014-07-23 (×10): qty 2

## 2014-07-23 MED ORDER — MORPHINE SULFATE 2 MG/ML IJ SOLN
2.0000 mg | INTRAMUSCULAR | Status: DC | PRN
  Administered 2014-07-23 – 2014-07-27 (×2): 2 mg via INTRAVENOUS
  Filled 2014-07-23 (×2): qty 1

## 2014-07-23 MED ORDER — METRONIDAZOLE IN NACL 5-0.79 MG/ML-% IV SOLN
500.0000 mg | Freq: Once | INTRAVENOUS | Status: DC
  Administered 2014-07-23: 500 mg via INTRAVENOUS

## 2014-07-23 MED ORDER — ONDANSETRON HCL 4 MG/2ML IJ SOLN: 4.0000 mg | Freq: Four times a day (QID) | INTRAMUSCULAR | Status: DC | PRN

## 2014-07-23 MED ORDER — CEFTRIAXONE SODIUM IN DEXTROSE 20 MG/ML IV SOLN
INTRAVENOUS | Status: AC
  Filled 2014-07-23: qty 50

## 2014-07-23 MED ORDER — METRONIDAZOLE IN NACL 5-0.79 MG/ML-% IV SOLN
500.0000 mg | Freq: Three times a day (TID) | INTRAVENOUS | Status: DC
  Administered 2014-07-24 – 2014-07-26 (×8): 500 mg via INTRAVENOUS
  Filled 2014-07-23 (×11): qty 100

## 2014-07-23 MED ORDER — ONDANSETRON HCL 4 MG/2ML IJ SOLN
INTRAMUSCULAR | Status: AC
  Filled 2014-07-23: qty 2

## 2014-07-23 MED ORDER — SODIUM CHLORIDE 0.9 % IV BOLUS (SEPSIS)
500.0000 mL | INTRAVENOUS | Status: AC
  Administered 2014-07-23: 500 mL via INTRAVENOUS

## 2014-07-23 MED ORDER — IOHEXOL 240 MG/ML SOLN
25.0000 mL | Freq: Once | INTRAMUSCULAR | Status: AC | PRN
  Administered 2014-07-23: 25 mL via ORAL

## 2014-07-23 MED ORDER — CEFTRIAXONE SODIUM IN DEXTROSE 20 MG/ML IV SOLN
1.0000 g | INTRAVENOUS | Status: AC
  Administered 2014-07-23: 1 g via INTRAVENOUS

## 2014-07-23 MED ORDER — METRONIDAZOLE IN NACL 5-0.79 MG/ML-% IV SOLN
INTRAVENOUS | Status: AC
  Administered 2014-07-23: 500 mg via INTRAVENOUS
  Filled 2014-07-23: qty 100

## 2014-07-23 MED ORDER — MORPHINE SULFATE 4 MG/ML IJ SOLN
4.0000 mg | Freq: Once | INTRAMUSCULAR | Status: AC
  Administered 2014-07-23: 4 mg via INTRAVENOUS

## 2014-07-23 MED ORDER — MORPHINE SULFATE 4 MG/ML IJ SOLN
INTRAMUSCULAR | Status: AC
  Filled 2014-07-23: qty 1

## 2014-07-23 MED ORDER — ONDANSETRON HCL 4 MG/2ML IJ SOLN
4.0000 mg | INTRAMUSCULAR | Status: AC
  Administered 2014-07-23: 4 mg via INTRAVENOUS

## 2014-07-23 NOTE — ED Notes (Signed)
Patient ambulated to room commode with a steady gait. 
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
## 2014-07-23 NOTE — ED Provider Notes (Signed)
[facility] Emergency Department Provider Note  ____________________________________________  Time seen: Approximately 12:29 PM  I have reviewed the triage vital signs and the nursing notes.   HISTORY  Chief Complaint Abdominal Pain    HPI [name] is a 81 y.o. female with a history of A. Fib (possibly paroxysmal), non-Hodgkin's lymphoma (not currently treated), and recent admissions for similar issues presents with about 2 days of constant but waxing and waning lower abdominal pain accompanied by nausea.  Nothing makes it better and nothing makes it worse.  She does not know why it started 2 days ago but it has been persistent and preventing her from sleeping well.  She describes the pain as dull and cramping ranging in severity from mild to severe.  It is currently moderate.  She has been in the hospital recently and worked up for possible cardiac causes but that workup was reassuring.  She also had a recent colonoscopy which was unremarkable.  Her primary care doctor is Dr. Ginette Pitman.She continues to have normal bowel movements, and in spite of her nausea has not had any episodes of vomiting recently.   Past Medical History  Diagnosis Date  . Cancer   . Coronary artery disease   . Afib   . Hyperlipemia   . Thyroid disease   . Anxiety     Patient Active Problem List   Diagnosis Date Noted  . Diverticulitis 07/23/2014    Past Surgical History  Procedure Laterality Date  . Abdominal hysterectomy      No current outpatient prescriptions on file.  Allergies Sulfa antibiotics  No family history on file.  Social History History  Substance Use Topics  . Smoking status: Never Smoker   . Smokeless tobacco: Not on file  . Alcohol Use: Not on file    Review of Systems Constitutional: No fever/chills Eyes: No visual changes. ENT: No sore throat. Cardiovascular: Denies chest pain. Respiratory: Denies shortness of breath. Gastrointestinal:  Abdominal pain as described above  nausea, no vomiting.  No diarrhea.  No constipation. Genitourinary: Negative for dysuria. Musculoskeletal: Negative for back pain. Skin: Negative for rash. Neurological: Negative for headaches, focal weakness or numbness.  10-point ROS otherwise negative.  ____________________________________________   PHYSICAL EXAM:  VITAL SIGNS: ED Triage Vitals  Enc Vitals Group     BP 07/23/14 1045 116/50 mmHg     Pulse Rate 07/23/14 1100 79     Resp --      Temp 07/23/14 1045 98.3 F (36.8 C)     Temp Source 07/23/14 1045 Oral     SpO2 07/23/14 1045 96 %     Weight 07/23/14 1046 157 lb (71.215 kg)     Height 07/23/14 1046 5\' 1"  (1.549 m)     Head Cir --      Peak Flow --      Pain Score 07/23/14 1046 7     Pain Loc --      Pain Edu? --      Excl. in [place]? --     Constitutional: Alert and oriented. Well appearing and in no acute distress. Eyes: Conjunctivae are normal. PERRL. EOMI. Head: Atraumatic. Nose: No congestion/rhinnorhea. Mouth/Throat: Mucous membranes are moist.  Oropharynx non-erythematous. Neck: No stridor.   Cardiovascular: Normal rate, regular rhythm. Grossly normal heart sounds.  Good peripheral circulation. Respiratory: Normal respiratory effort.  No retractions. Lungs CTAB. Gastrointestinal: Soft with lower abdominal tenderness and guarding but no focal points. No distention. No abdominal bruits. No CVA  tenderness. Musculoskeletal: No lower extremity tenderness nor edema.  No joint effusions. Neurologic:  Normal speech and language. No gross focal neurologic deficits are appreciated. Speech is normal. No gait instability. Skin:  Skin is warm, dry and intact. No rash noted. Psychiatric: Mood and affect are normal. Speech and behavior are normal.  ____________________________________________   LABS (all labs ordered are listed, but only abnormal results are displayed)  Labs Reviewed  CBC WITH DIFFERENTIAL/PLATELET - Abnormal;  Notable for the following:    WBC 13.2 (*)    Neutro Abs 9.9 (*)    Monocytes Absolute 1.6 (*)    All other components within normal limits  COMPREHENSIVE METABOLIC PANEL - Abnormal; Notable for the following:    Potassium 3.1 (*)    Chloride 96 (*)    Glucose, Bld 130 (*)    Creatinine, Ser 1.30 (*)    Total Bilirubin 1.5 (*)    GFR calc non Af Amer 38 (*)    GFR calc Af Amer 44 (*)    All other components within normal limits  URINALYSIS COMPLETEWITH MICROSCOPIC ([facility] ONLY) - Abnormal; Notable for the following:    Color, Urine YELLOW (*)    APPearance CLEAR (*)    Hgb urine dipstick 2+ (*)    Leukocytes, UA 2+ (*)    Bacteria, UA RARE (*)    Squamous Epithelial / LPF 0-5 (*)    All other components within normal limits  URINE CULTURE  LIPASE, BLOOD  TROPONIN I  LACTIC ACID, PLASMA   ____________________________________________  EKG  ED ECG REPORT I, Claudeen Leason, the attending physician, personally viewed and interpreted this ECG.  Date: 07/23/2014 EKG Time: 12:49 Rate: 80 Rhythm: normal sinus rhythm QRS Axis: normal Intervals: normal ST/T Wave abnormalities: normal Conduction Disutrbances: none Narrative Interpretation: unremarkable  ____________________________________________  RADIOLOGY  Ct Abdomen Pelvis W Contrast  07/23/2014   CLINICAL DATA:  Intermittent lower abdominal pain with nausea vomiting and diarrhea 2 months. Elevated white blood cell count. History of non-Hodgkin's lymphoma 2009 with chemotherapy.  EXAM: CT ABDOMEN AND PELVIS WITH CONTRAST  TECHNIQUE: Multidetector CT imaging of the abdomen and pelvis was performed using the standard protocol following bolus administration of intravenous contrast.  CONTRAST:  73mL OMNIPAQUE IOHEXOL 300 MG/ML  SOLN  COMPARISON:  04/13/2008  FINDINGS: Lung bases demonstrate subtle patchy ground-glass opacification bilaterally. There is minimal pleural calcification along the right after medic surface slightly more  pronounced. No evidence of pleural effusion. Heart size is normal. There is calcification over the mitral valve annulus. There is calcified plaque over the left anterior descending coronary artery. Small amount of contrast present over the distal esophagus.  Abdominal images demonstrate the liver, spleen, gallbladder, pancreas and adrenal glands to be within normal. Kidneys are normal in size without hydronephrosis or nephrolithiasis. There is a 2.6 cm cyst over the mid pole of the right kidney unchanged. Ureters are normal. There are multiple surgical clips over the right abdomen just below the liver. There is mild calcified plaque over the abdominal aorta. There findings suggesting a couple small ventral hernias containing only mesenteric fat.  There is diverticulosis throughout the colon which is worse over the sigmoid colon. There is associated wall thickening and adjacent inflammatory change involving a segment of sigmoid colon in the midline lower abdomen/ pelvis compatible with acute diverticulitis. There is mild focal fluid immediately inferior to the inflamed sigmoid colon which extends to abut the dome of the bladder as cannot exclude early diverticular abscess formation in  this region. No evidence of perforation. Small amount of adjacent free fluid.  The bladder and rectum are within normal. 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 Mild degenerate change of the spine and hips. Subtle grade 1 anterolisthesis of L2 on L3.  IMPRESSION: Acute diverticulitis of the sigmoid colon. No evidence of perforation. Focal fluid immediately inferior to the inflamed sigmoid colon may be phlegmon versus early diverticular abscess formation which abuts the bladder dome.  2.6 cm simple left renal cyst unchanged.  Subtle patchy bibasilar ground-glass attenuation which may be due to edema and less likely atypical infection/ inflammation.  Mild atherosclerotic coronary artery disease. Calcification of the mitral valve annulus.   Couple tiny ventral hernias containing only mesenteric fat.   Electronically Signed   By: Marin Olp M.D.   On: 07/23/2014 14:20    ____________________________________________  PROCEDURES  Procedure(s) performed: None  Critical Care performed: No ____________________________________________   INITIAL IMPRESSION / ASSESSMENT AND PLAN / ED COURSE  Pertinent labs & imaging results that were available during my care of the patient were reviewed by me and considered in my medical decision making (see chart for details).  Though she has been admitted recently for similar issues the focus was on cardiac causes and she has not had a recent CT abdomen.  Her symptoms could possibly be a result of a partial SBO although SBO/ileus is unlikely given the lack of vomiting and the fact she is having bowel movements.  I will evaluate with a CT abdomen and pelvis.  Her labs are only remarkable for a slight leukocytosis but she does not have any respiratory symptoms.  I will obtain an EKG to check and see if she still is in A. fib; mesenteric ischemia is very unlikely given her generally well appearance but must be considered given her history.  ----------------------------------------- 2:56 PM on 07/23/2014 -----------------------------------------  The patient's urine is grossly infected and I provided 1 g of ceftriaxone while awaiting the CT results.  The CT is concerning for acute diverticulitis with a fluid collection abutting the bladder dome.  This raises concerns for a diverticular abscess and possible colovesicular fistula given the grossly infected urine.  I am giving Flagyl 500 mg IV, making the patient nothing by mouth.  I spoke with Dr. Marina Gravel by phone and consult at him for an ED evaluation and possible admission.  He is in the operating room but will come to the emergency department when he is available.  I have given the patient 500 mils of normal saline and we will start a rate of 100  mL/hr  ----------------------------------------- 3:10 PM on 07/23/2014 -----------------------------------------  Dr. Marina Gravel has put in admission orders.  I updated the patient and her daughter about the plan. ____________________________________________  FINAL CLINICAL IMPRESSION(S) / ED DIAGNOSES  Final diagnoses:  Nausea  Acute diverticulitis  Colovesical fistula      NEW MEDICATIONS STARTED DURING THIS VISIT:  New Prescriptions   No medications on file     Hinda Kehr, MD 07/23/14 1511

## 2014-07-23 NOTE — ED Notes (Signed)
Frequent lower abd pain / nausea , on and off x 1 month admitted for same x1 month ago, pt came from [facility] this am , they sent her here for further eval

## 2014-07-23 NOTE — ED Notes (Signed)
Patient is back from CT scan prefers not to have B/P cuff on at this time.

## 2014-07-24 LAB — CBC
HCT: 35.5 % (ref 35.0–47.0)
Hemoglobin: 11.6 g/dL — ABNORMAL LOW (ref 12.0–16.0)
MCH: 30.7 pg (ref 26.0–34.0)
MCHC: 32.8 g/dL (ref 32.0–36.0)
MCV: 93.7 fL (ref 80.0–100.0)
Platelets: 204 10*3/uL (ref 150–440)
RBC: 3.79 MIL/uL — ABNORMAL LOW (ref 3.80–5.20)
RDW: 13.8 % (ref 11.5–14.5)
WBC: 12.7 10*3/uL — ABNORMAL HIGH (ref 3.6–11.0)

## 2014-07-24 NOTE — Progress Notes (Signed)
[facility]   PATIENT NAME: [name]    MR#:  [id number]  DATE OF BIRTH:  Nov 14, 1932  SUBJECTIVE:  She is feeling overall much better. She is passing gas. No nausea no vomiting she is thirsty.  REVIEW OF SYSTEMS:   Review of Systems  Constitutional: Negative for fever and chills.  HENT: Negative.   Respiratory: Negative.   Cardiovascular: Negative.   Gastrointestinal: Positive for abdominal pain. Negative for nausea, vomiting, diarrhea, constipation and blood in stool.  All other systems reviewed and are negative.   DRUG ALLERGIES:   Allergies  Allergen Reactions  . Sulfa Antibiotics Hives and Rash    VITALS:  Blood pressure 101/52, pulse 66, temperature 99.2 F (37.3 C), temperature source Oral, resp. rate 17, height 5\' 1"  (1.549 m), weight 72.757 kg (160 lb 6.4 oz), SpO2 96 %.  PHYSICAL EXAMINATION:  GENERAL:  81 y.o.-year-old patient lying in the bed with no acute distress.  EYES: Pupils equal, round, reactive to light and accommodation. No scleral icterus. Extraocular muscles intact.  HEENT: Head atraumatic, normocephalic. Oropharynx and nasopharynx clear.  NECK:  Supple, no jugular venous distention. No thyroid enlargement, no tenderness.  LUNGS: Normal breath sounds bilaterally, no wheezing, rales,rhonchi or crepitation. No use of accessory muscles of respiration.  CARDIOVASCULAR: S1, S2 normal. No murmurs, rubs, or gallops.  ABDOMEN: Soft, nontender, nondistended. Bowel sounds present. No organomegaly or mass. There is minimal tenderness in the suprapubic midline and left upper quadrant. Much less so than yesterday. EXTREMITIES: No pedal edema, cyanosis, or clubbing.  NEUROLOGIC: Cranial nerves II through XII are intact. Muscle strength 5/5 in all extremities. Sensation intact. Gait not checked.  PSYCHIATRIC: The patient is alert and oriented x 3.  SKIN: No obvious rash, lesion, or ulcer.    CBC Latest Ref Rng 07/24/2014 07/23/2014 06/21/2014  WBC  3.6 - 11.0 K/uL 12.7(H) 13.2(H) 6.6  Hemoglobin 12.0 - 16.0 g/dL 11.6(L) 13.0 12.4  Hematocrit 35.0 - 47.0 % 35.5 39.1 37.2  Platelets 150 - 440 K/uL 204 259 244    BMP Latest Ref Rng 07/23/2014 06/21/2014 06/20/2014  Glucose 65 - 99 mg/dL 130(H) 100(H) 96  BUN 6 - 20 mg/dL 13 21(H) 19  Creatinine 0.44 - 1.00 mg/dL 1.30(H) 1.22(H) 1.14(H)  Sodium 135 - 145 mmol/L 135 138 139  Potassium 3.5 - 5.1 mmol/L 3.1(L) 3.9 3.7  Chloride 101 - 111 mmol/L 96(L) 107 105  CO2 22 - 32 mmol/L 26 27 27   Calcium 8.9 - 10.3 mg/dL 8.9 8.6(L) 8.9     ASSESSMENT AND PLAN:    81 year old white female with sigmoid diverticulitis which appears to be improving on bowel rest and intravenous anti-biotics. I will give her clear liquid diet. We will mobilize the patient. Overall she is making some progress.

## 2014-07-25 LAB — CBC
HCT: 35.1 % (ref 35.0–47.0)
Hemoglobin: 11.9 g/dL — ABNORMAL LOW (ref 12.0–16.0)
MCH: 31.5 pg (ref 26.0–34.0)
MCHC: 33.8 g/dL (ref 32.0–36.0)
MCV: 93.1 fL (ref 80.0–100.0)
Platelets: 191 10*3/uL (ref 150–440)
RBC: 3.77 MIL/uL — AB (ref 3.80–5.20)
RDW: 13.5 % (ref 11.5–14.5)
WBC: 9.3 10*3/uL (ref 3.6–11.0)

## 2014-07-25 LAB — BASIC METABOLIC PANEL
ANION GAP: 6 (ref 5–15)
BUN: 9 mg/dL (ref 6–20)
CHLORIDE: 106 mmol/L (ref 101–111)
CO2: 29 mmol/L (ref 22–32)
CREATININE: 1.15 mg/dL — AB (ref 0.44–1.00)
Calcium: 8.2 mg/dL — ABNORMAL LOW (ref 8.9–10.3)
GFR calc Af Amer: 51 mL/min — ABNORMAL LOW (ref >60)
GFR calc non Af Amer: 44 mL/min — ABNORMAL LOW (ref >60)
Glucose, Bld: 119 mg/dL — ABNORMAL HIGH (ref 65–99)
Potassium: 3 mmol/L — ABNORMAL LOW (ref 3.5–5.1)
Sodium: 141 mmol/L (ref 135–145)

## 2014-07-25 LAB — URINE CULTURE: SPECIAL REQUESTS: NORMAL

## 2014-07-25 NOTE — Progress Notes (Signed)
Subjective:   She has moderate lower abdominal pain but overall feeling better. She has no nausea or vomiting. She is voiding easily and feels like her urine is clearing.  Vital signs in last 24 hours: Temp:  [98.2 F (36.8 C)-101.8 F (38.8 C)] 99.3 F (37.4 C) (05/30 0743) Pulse Rate:  [62-73] 62 (05/30 0743) Resp:  [16-17] 17 (05/30 0743) BP: (92-111)/(44-54) 109/52 mmHg (05/30 0942) SpO2:  [93 %-94 %] 93 % (05/30 0743) Weight:  [76.431 kg (168 lb 8 oz)] 76.431 kg (168 lb 8 oz) (05/30 0219) Last BM Date: 07/23/14  Intake/Output from previous day: 05/29 0701 - 05/30 0700 In: 1610.4 [I.V.:1610.4] Out: 1225 [Urine:1225]  Exam:  Her abdomen is mildly tender. She does not have any rebound but some mild suprapubic guarding. She does have active bowel sounds.  Lab Results:  CBC  Recent Labs  07/24/14 0632 07/25/14 0558  WBC 12.7* 9.3  HGB 11.6* 11.9*  HCT 35.5 35.1  PLT 204 191   CMP     Component Value Date/Time   NA 141 07/25/2014 0558   NA 138 06/21/2014 0503   K 3.0* 07/25/2014 0558   K 3.9 06/21/2014 0503   CL 106 07/25/2014 0558   CL 107 06/21/2014 0503   CO2 29 07/25/2014 0558   CO2 27 06/21/2014 0503   GLUCOSE 119* 07/25/2014 0558   GLUCOSE 100* 06/21/2014 0503   BUN 9 07/25/2014 0558   BUN 21* 06/21/2014 0503   CREATININE 1.15* 07/25/2014 0558   CREATININE 1.22* 06/21/2014 0503   CALCIUM 8.2* 07/25/2014 0558   CALCIUM 8.6* 06/21/2014 0503   PROT 7.4 07/23/2014 1106   PROT 7.1 06/19/2014 0456   ALBUMIN 4.0 07/23/2014 1106   ALBUMIN 3.6 06/19/2014 0456   AST 26 07/23/2014 1106   AST 17 06/19/2014 0456   ALT 19 07/23/2014 1106   ALT 13* 06/19/2014 0456   ALKPHOS 63 07/23/2014 1106   ALKPHOS 62 06/19/2014 0456   BILITOT 1.5* 07/23/2014 1106   GFRNONAA 44* 07/25/2014 0558   GFRNONAA 42* 06/21/2014 0503   GFRAA 51* 07/25/2014 0558   GFRAA 49* 06/21/2014 0503   PT/INR No results for input(s): LABPROT, INR in the last 72  hours.  Studies/Results: Ct Abdomen Pelvis W Contrast  07/23/2014   CLINICAL DATA:  Intermittent lower abdominal pain with nausea vomiting and diarrhea 2 months. Elevated white blood cell count. History of non-Hodgkin's lymphoma 2009 with chemotherapy.  EXAM: CT ABDOMEN AND PELVIS WITH CONTRAST  TECHNIQUE: Multidetector CT imaging of the abdomen and pelvis was performed using the standard protocol following bolus administration of intravenous contrast.  CONTRAST:  35mL OMNIPAQUE IOHEXOL 300 MG/ML  SOLN  COMPARISON:  04/13/2008  FINDINGS: Lung bases demonstrate subtle patchy ground-glass opacification bilaterally. There is minimal pleural calcification along the right after medic surface slightly more pronounced. No evidence of pleural effusion. Heart size is normal. There is calcification over the mitral valve annulus. There is calcified plaque over the left anterior descending coronary artery. Small amount of contrast present over the distal esophagus.  Abdominal images demonstrate the liver, spleen, gallbladder, pancreas and adrenal glands to be within normal. Kidneys are normal in size without hydronephrosis or nephrolithiasis. There is a 2.6 cm cyst over the mid pole of the right kidney unchanged. Ureters are normal. There are multiple surgical clips over the right abdomen just below the liver. There is mild calcified plaque over the abdominal aorta. There findings suggesting a couple small ventral hernias containing only mesenteric fat.  There is diverticulosis throughout the colon which is worse over the sigmoid colon. There is associated wall thickening and adjacent inflammatory change involving a segment of sigmoid colon in the midline lower abdomen/ pelvis compatible with acute diverticulitis. There is mild focal fluid immediately inferior to the inflamed sigmoid colon which extends to abut the dome of the bladder as cannot exclude early diverticular abscess formation in this region. No evidence of  perforation. Small amount of adjacent free fluid.  The bladder and rectum are within normal. 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 Mild degenerate change of the spine and hips. Subtle grade 1 anterolisthesis of L2 on L3.  IMPRESSION: Acute diverticulitis of the sigmoid colon. No evidence of perforation. Focal fluid immediately inferior to the inflamed sigmoid colon may be phlegmon versus early diverticular abscess formation which abuts the bladder dome.  2.6 cm simple left renal cyst unchanged.  Subtle patchy bibasilar ground-glass attenuation which may be due to edema and less likely atypical infection/ inflammation.  Mild atherosclerotic coronary artery disease. Calcification of the mitral valve annulus.  Couple tiny ventral hernias containing only mesenteric fat.   Electronically Signed   By: Marin Olp M.D.   On: 07/23/2014 14:20    Assessment/Plan: Overall she seems to be improving. We'll continue her antibiotic therapy present time. We discussed possibility of surgery with colostomy. We will hopefully clear this problem up with antibiotic therapy and consider possible colonoscopy as an outpatient. I agree with Dr. 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

## 2014-07-25 NOTE — Progress Notes (Signed)
Initial Nutrition Assessment  DOCUMENTATION CODES:     INTERVENTION: Medical Food Supplement Therapy: will recommend Boost Breeze on follow-up if diet remains CL    NUTRITION DIAGNOSIS:  Inadequate oral intake related to acute illness as evidenced by  (NPO/CL status).  GOAL:   (Goal for diet advancement as medically able within 5-7days)  MONITOR:   (Energy Intake, Electrolyte and renal Profile, Digestive System, Anthropometrics)  REASON FOR ASSESSMENT:  Malnutrition Screening Tool    ASSESSMENT:  Pt admitted with abdominal pain, per MD note CT consistent with diverticulitis; possible surgical intervention versus antibiotic therapy. PMHx: Past Medical History  Diagnosis Date  . Cancer   . Coronary artery disease   . Afib   . Hyperlipemia   . Thyroid disease   . Anxiety     PO intake: pt reports reduced appetite PTA however pt reports sporadic eating patterns secondary to living alone as well. Pt reports tolerating popsicle, broth from floor.  Medications: D5 L-R at 173mL/hr (providing 510kcals in 24 hours), Flagyl Labs: Electrolyte and Renal Profile:    Recent Labs Lab 07/23/14 1106 07/25/14 0558  BUN 13 9  CREATININE 1.30* 1.15*  NA 135 141  K 3.1* 3.0*   Glucose Profile: No results for input(s): GLUCAP in the last 72 hours. Protein Profile:  Recent Labs Lab 07/23/14 1106  ALBUMIN 4.0   Pt reports weight loss from 174lbs to 157lbs over the past month (10% weight loss) however current weight 168lbs (3% weight loss in one month)  Height:  Ht Readings from Last 1 Encounters:  07/23/14 5\' 1"  (1.549 m)    Weight:  Wt Readings from Last 1 Encounters:  07/25/14 168 lb 8 oz (76.431 kg)    Ideal Body Weight:     Wt Readings from Last 10 Encounters:  07/25/14 168 lb 8 oz (76.431 kg)    BMI:  Body mass index is 31.85 kg/(m^2).   Nutrition-Focused physical exam completed. Findings are WDL for fat depletion, muscle depletion, and edema.    Estimated Nutritional Needs:  Kcal:  1174-1388kcals, BEE: 889kcals, TEE: (IF 1.1-1.3)(AF 1.2) using IBW of 47.7kg  Protein:  48-57g protein (1.0-1.2g/kg) using IBw of 47.7kg  Fluid:  1193-1413mL of fluid (25-39mL/kg) using IBW of 47.7kg  Skin:  Reviewed, no issues  Diet Order:  Diet clear liquid Room service appropriate?: Yes; Fluid consistency:: Thin  EDUCATION NEEDS:  Education needs no appropriate at this time   Intake/Output Summary (Last 24 hours) at 07/25/14 1445 Last data filed at 07/25/14 1003  Gross per 24 hour  Intake 1477.08 ml  Output   1600 ml  Net -122.92 ml    Last BM:  5/28  [place], RD, LDN Pager [phone]

## 2014-07-26 MED ORDER — CLINDAMYCIN HCL 150 MG PO CAPS
300.0000 mg | ORAL_CAPSULE | Freq: Three times a day (TID) | ORAL | Status: DC
  Administered 2014-07-26 – 2014-07-27 (×3): 300 mg via ORAL
  Filled 2014-07-26 (×3): qty 2

## 2014-07-26 MED ORDER — CEPHALEXIN 500 MG PO CAPS
500.0000 mg | ORAL_CAPSULE | Freq: Three times a day (TID) | ORAL | Status: DC
  Administered 2014-07-26 – 2014-07-27 (×3): 500 mg via ORAL
  Filled 2014-07-26 (×3): qty 1

## 2014-07-26 NOTE — Progress Notes (Signed)
Subjective: She is subjectively improved this morning. She is having less pain minimal nausea and no fever. She was ambulating last evening. She is voiding. Overall she feels much improved.  Vital signs in last 24 hours: Temp:  [97.6 F (36.4 C)-98.9 F (37.2 C)] 97.6 F (36.4 C) (05/31 0739) Pulse Rate:  [67-91] 67 (05/31 0739) Resp:  [16-20] 20 (05/31 0739) BP: (109-128)/(61-81) 128/61 mmHg (05/31 0739) SpO2:  [96 %-99 %] 99 % (05/31 0739) Weight:  [79.924 kg (176 lb 3.2 oz)] 79.924 kg (176 lb 3.2 oz) (05/31 0445) Last BM Date: 07/25/14  Intake/Output from previous day: 05/30 0701 - 05/31 0700 In: 2811.3 [I.V.:2811.3] Out: 2175 [Urine:2175]  Exam:  Her abdomen is soft with minimal suprapubic tenderness. She has active bowel sounds.  Lab Results:  CBC  Recent Labs  07/24/14 0632 07/25/14 0558  WBC 12.7* 9.3  HGB 11.6* 11.9*  HCT 35.5 35.1  PLT 204 191   CMP     Component Value Date/Time   NA 141 07/25/2014 0558   NA 138 06/21/2014 0503   K 3.0* 07/25/2014 0558   K 3.9 06/21/2014 0503   CL 106 07/25/2014 0558   CL 107 06/21/2014 0503   CO2 29 07/25/2014 0558   CO2 27 06/21/2014 0503   GLUCOSE 119* 07/25/2014 0558   GLUCOSE 100* 06/21/2014 0503   BUN 9 07/25/2014 0558   BUN 21* 06/21/2014 0503   CREATININE 1.15* 07/25/2014 0558   CREATININE 1.22* 06/21/2014 0503   CALCIUM 8.2* 07/25/2014 0558   CALCIUM 8.6* 06/21/2014 0503   PROT 7.4 07/23/2014 1106   PROT 7.1 06/19/2014 0456   ALBUMIN 4.0 07/23/2014 1106   ALBUMIN 3.6 06/19/2014 0456   AST 26 07/23/2014 1106   AST 17 06/19/2014 0456   ALT 19 07/23/2014 1106   ALT 13* 06/19/2014 0456   ALKPHOS 63 07/23/2014 1106   ALKPHOS 62 06/19/2014 0456   BILITOT 1.5* 07/23/2014 1106   GFRNONAA 44* 07/25/2014 0558   GFRNONAA 42* 06/21/2014 0503   GFRAA 51* 07/25/2014 0558   GFRAA 49* 06/21/2014 0503   PT/INR No results for input(s): LABPROT, INR in the last 72 hours.  Studies/Results: No results  found.  Assessment/Plan: Overall she seems to have improved on antibiotic therapy. We will switch her to by mouth antibiotics and by mouth pain medication advance her diet and see how she does with regard to radiculitis. Her urine analysis identified some evidence of infection and a culture is still pending but initial reports grew multiple organisms. She may have a fistulous tract. We will attempt to get her over this episode and then discuss further intervention with her in detail. She is in agreement.

## 2014-07-27 ENCOUNTER — Inpatient Hospital Stay: Payer: Medicare Other

## 2014-07-27 ENCOUNTER — Other Ambulatory Visit: Payer: Self-pay

## 2014-07-27 DIAGNOSIS — K5792 Diverticulitis of intestine, part unspecified, without perforation or abscess without bleeding: Secondary | ICD-10-CM

## 2014-07-27 LAB — COMPREHENSIVE METABOLIC PANEL
ALT: 11 U/L — AB (ref 14–54)
AST: 18 U/L (ref 15–41)
Albumin: 3.1 g/dL — ABNORMAL LOW (ref 3.5–5.0)
Alkaline Phosphatase: 45 U/L (ref 38–126)
Anion gap: 6 (ref 5–15)
BILIRUBIN TOTAL: 0.9 mg/dL (ref 0.3–1.2)
BUN: 5 mg/dL — ABNORMAL LOW (ref 6–20)
CALCIUM: 8.5 mg/dL — AB (ref 8.9–10.3)
CO2: 27 mmol/L (ref 22–32)
Chloride: 108 mmol/L (ref 101–111)
Creatinine, Ser: 0.99 mg/dL (ref 0.44–1.00)
GFR calc Af Amer: >60 mL/min (ref >60)
GFR, EST NON AFRICAN AMERICAN: 53 mL/min — AB (ref >60)
Glucose, Bld: 98 mg/dL (ref 65–99)
POTASSIUM: 3 mmol/L — AB (ref 3.5–5.1)
Sodium: 141 mmol/L (ref 135–145)
Total Protein: 6.4 g/dL — ABNORMAL LOW (ref 6.5–8.1)

## 2014-07-27 LAB — CBC
HCT: 35.7 % (ref 35.0–47.0)
Hemoglobin: 11.8 g/dL — ABNORMAL LOW (ref 12.0–16.0)
MCH: 31 pg (ref 26.0–34.0)
MCHC: 33.1 g/dL (ref 32.0–36.0)
MCV: 93.5 fL (ref 80.0–100.0)
Platelets: 233 10*3/uL (ref 150–440)
RBC: 3.82 MIL/uL (ref 3.80–5.20)
RDW: 13.7 % (ref 11.5–14.5)
WBC: 6.7 10*3/uL (ref 3.6–11.0)

## 2014-07-27 LAB — TROPONIN I: Troponin I: <0.03 ng/mL (ref <0.031)

## 2014-07-27 MED ORDER — CEFTRIAXONE SODIUM IN DEXTROSE 20 MG/ML IV SOLN
1.0000 g | Freq: Two times a day (BID) | INTRAVENOUS | Status: DC
  Administered 2014-07-27 (×2): 1 g via INTRAVENOUS
  Filled 2014-07-27 (×5): qty 50

## 2014-07-27 MED ORDER — IPRATROPIUM-ALBUTEROL 0.5-2.5 (3) MG/3ML IN SOLN
RESPIRATORY_TRACT | Status: AC
  Administered 2014-07-27: 04:00:00
  Filled 2014-07-27: qty 3

## 2014-07-27 MED ORDER — METRONIDAZOLE IN NACL 5-0.79 MG/ML-% IV SOLN
500.0000 mg | Freq: Three times a day (TID) | INTRAVENOUS | Status: DC
  Administered 2014-07-27 – 2014-07-28 (×3): 500 mg via INTRAVENOUS
  Filled 2014-07-27 (×7): qty 100

## 2014-07-27 MED ORDER — IPRATROPIUM-ALBUTEROL 0.5-2.5 (3) MG/3ML IN SOLN
3.0000 mL | RESPIRATORY_TRACT | Status: DC | PRN
  Administered 2014-07-27: 3 mL via RESPIRATORY_TRACT
  Filled 2014-07-27: qty 3

## 2014-07-27 MED ORDER — ALBUTEROL SULFATE (2.5 MG/3ML) 0.083% IN NEBU: 3.0000 mL | INHALATION_SOLUTION | Freq: Two times a day (BID) | RESPIRATORY_TRACT | Status: DC

## 2014-07-27 MED ORDER — ALBUTEROL SULFATE HFA 108 (90 BASE) MCG/ACT IN AERS: 1.0000 | INHALATION_SPRAY | RESPIRATORY_TRACT | Status: DC

## 2014-07-27 MED ORDER — IPRATROPIUM-ALBUTEROL 0.5-2.5 (3) MG/3ML IN SOLN: 3.0000 mL | RESPIRATORY_TRACT | Status: DC

## 2014-07-27 MED ORDER — FUROSEMIDE 10 MG/ML IJ SOLN
20.0000 mg | Freq: Once | INTRAMUSCULAR | Status: AC
  Administered 2014-07-27: 20 mg via INTRAVENOUS
  Filled 2014-07-27: qty 2

## 2014-07-27 NOTE — Progress Notes (Signed)
[facility]   PATIENT NAME: [name]    MR#:  [id number]  DATE OF BIRTH:  [birth year]  SUBJECTIVE:   She is still having some lower abdominal discomfort. She denies any pneumaturia. No burning with urination.   REVIEW OF SYSTEMS:   Review of Systems  Constitutional: Negative for fever and chills.  HENT: Negative.   Gastrointestinal: Positive for abdominal pain. Negative for heartburn and nausea.  Psychiatric/Behavioral: Negative.   All other systems reviewed and are negative.   DRUG ALLERGIES:   Allergies  Allergen Reactions  . Sulfa Antibiotics Hives and Rash    VITALS:  Blood pressure 114/54, pulse 80, temperature 99.2 F (37.3 C), temperature source Oral, resp. rate 17, height 5\' 1"  (1.549 m), weight 77.111 kg (170 lb), SpO2 100 %.  PHYSICAL EXAMINATION:  GENERAL:  81 y.o.-year-old patient lying in the bed with no acute distress.  EYES: Pupils equal, round, reactive to light and accommodation. No scleral icterus. Extraocular muscles intact.  HEENT: Head atraumatic, normocephalic. Oropharynx and nasopharynx clear.  NECK:  Supple, no jugular venous distention. No thyroid enlargement, no tenderness.  LUNGS: Normal breath sounds bilaterally, no wheezing, rales,rhonchi or crepitation. No use of accessory muscles of respiration.  CARDIOVASCULAR: S1, S2 normal. No murmurs, rubs, or gallops.  ABDOMEN: Her abdomen is somewhat tender in the lower abdomen. There are no peritoneal signs present.  EXTREMITIES: No pedal edema, cyanosis, or clubbing.  NEUROLOGIC: Cranial nerves II through XII are intact. Muscle strength 5/5 in all extremities. Sensation intact. Gait not checked.  PSYCHIATRIC: The patient is alert and oriented x 3.  SKIN: No obvious rash, lesion, or ulcer.   CBC Latest Ref Rng 07/27/2014 07/25/2014 07/24/2014  WBC 3.6 - 11.0 K/uL 6.7 9.3 12.7(H)  Hemoglobin 12.0 - 16.0 g/dL 11.8(L) 11.9(L) 11.6(L)  Hematocrit 35.0 - 47.0 % 35.7 35.1 35.5  Platelets 150  - 440 K/uL 233 191 204    BMP Latest Ref Rng 07/27/2014 07/25/2014 07/23/2014  Glucose 65 - 99 mg/dL 98 119(H) 130(H)  BUN 6 - 20 mg/dL 5(L) 9 13  Creatinine 0.44 - 1.00 mg/dL 0.99 1.15(H) 1.30(H)  Sodium 135 - 145 mmol/L 141 141 135  Potassium 3.5 - 5.1 mmol/L 3.0(L) 3.0(L) 3.1(L)  Chloride 101 - 111 mmol/L 108 106 96(L)  CO2 22 - 32 mmol/L 27 29 26   Calcium 8.9 - 10.3 mg/dL 8.5(L) 8.2(L) 8.9      ASSESSMENT AND PLAN:   She is making slow improvement. 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 I believe that she will require surgical intervention. I discussed this with her and she seems to be understanding and in agreement with plan.  We will continue antibiotics for now.

## 2014-07-28 DIAGNOSIS — K5792 Diverticulitis of intestine, part unspecified, without perforation or abscess without bleeding: Secondary | ICD-10-CM

## 2014-07-28 LAB — CREATININE, SERUM
Creatinine, Ser: 1.05 mg/dL — ABNORMAL HIGH (ref 0.44–1.00)
GFR calc Af Amer: 57 mL/min — ABNORMAL LOW (ref >60)
GFR calc non Af Amer: 49 mL/min — ABNORMAL LOW (ref >60)

## 2014-07-28 MED ORDER — HYDROCODONE-ACETAMINOPHEN 5-325 MG PO TABS: 1.0000 | ORAL_TABLET | ORAL | Status: DC | PRN

## 2014-07-28 MED ORDER — METRONIDAZOLE 500 MG PO TABS: 500.0000 mg | ORAL_TABLET | Freq: Three times a day (TID) | ORAL | Status: DC

## 2014-07-28 MED ORDER — LEVOTHYROXINE SODIUM 25 MCG PO TABS: 25.0000 ug | ORAL_TABLET | Freq: Every day | ORAL | Status: DC

## 2014-07-28 MED ORDER — CEPHALEXIN 500 MG PO CAPS: 500.0000 mg | ORAL_CAPSULE | Freq: Three times a day (TID) | ORAL | Status: DC

## 2014-07-28 MED ORDER — FUROSEMIDE 40 MG PO TABS: 40.0000 mg | ORAL_TABLET | Freq: Every day | ORAL | Status: DC

## 2014-07-28 NOTE — Discharge Summary (Signed)
Patient ID: [name] MRN: [id number] DOB/AGE: 1932-09-19 81 y.o.  Admit date: 07/23/2014 Discharge date: 07/28/2014  Discharge Diagnoses:  Diverticulitis with possible microperforation  Procedures Performed: None  Discharged Condition: fair  Hospital Course: She was admitted following a clinical evaluation and imaging studies which suggested diverticulitis. She has history of diverticulitis in the past. She's had some urinary tract symptoms in addition. Urine culture demonstrated multiple organisms. She responded pain wise to her intravenous antibiotic therapy and her exam has improved. I'm very concerned that she has a developing colovesical fistula but she is set on being discharged present time. We will send her home on by mouth antibiotics and pain medication and resume her normal medications. We'll follow her up in our office in 1 week's time. If she continues to have symptoms or progresses we may want to consider surgical intervention on a more urgent basis. She is in agreement with this plan.  Discharge Orders: Discharge Instructions    Call MD for:  persistant nausea and vomiting    Complete by:  As directed      Call MD for:  severe uncontrolled pain    Complete by:  As directed      Call MD for:  temperature >100.4    Complete by:  As directed      Diet - low sodium heart healthy    Complete by:  As directed      Driving Restrictions    Complete by:  As directed   Do not drive while taking pain medicines     Increase activity slowly    Complete by:  As directed      Lifting restrictions    Complete by:  As directed   Resume normal activity with regard to lifting as soon as you have completed her antibiotics           Disposition:   Discharge Medications:  Current facility-administered medications:  .  acetaminophen (TYLENOL) tablet 650 mg, 650 mg, Oral, Q6H PRN **OR** acetaminophen (TYLENOL) suppository 650 mg, 650 mg, Rectal, Q6H PRN, Sherri Rad, MD .  alum &  mag hydroxide-simeth (MAALOX/MYLANTA) 200-200-20 MG/5ML suspension 30 mL, 30 mL, Oral, Q6H PRN, Sherri Rad, MD .  cefTRIAXone (ROCEPHIN) 1 g in dextrose 5 % 50 mL IVPB - Premix, 1 g, Intravenous, Q12H, Sherri Rad, MD, 1 g at 07/27/14 2225 .  enoxaparin (LOVENOX) injection 40 mg, 40 mg, Subcutaneous, Q24H, Sherri Rad, MD, 40 mg at 07/27/14 1659 .  HYDROcodone-acetaminophen (NORCO/VICODIN) 5-325 MG per tablet 1-2 tablet, 1-2 tablet, Oral, Q4H PRN, Sherri Rad, MD, 1 tablet at 07/26/14 0344 .  ipratropium-albuterol (DUONEB) 0.5-2.5 (3) MG/3ML nebulizer solution 3 mL, 3 mL, Nebulization, Q4H PRN, Marlyce Huge, MD, 3 mL at 07/27/14 2240 .  metroNIDAZOLE (FLAGYL) IVPB 500 mg, 500 mg, Intravenous, Q8H, Sherri Rad, MD, 500 mg at 07/28/14 0327 .  morphine 2 MG/ML injection 2 mg, 2 mg, Intravenous, Q2H PRN, Sherri Rad, MD, 2 mg at 07/27/14 0601 .  ondansetron (ZOFRAN) tablet 4 mg, 4 mg, Oral, Q6H PRN **OR** ondansetron (ZOFRAN) injection 4 mg, 4 mg, Intravenous, Q6H PRN, Sherri Rad, MD  Follwup: Follow-up Information    Follow up with [facility]. Schedule an appointment as soon as possible for a visit in 1 week.   Contact information:   [place] [phone]      Signed: Dia Crawford III 07/28/2014, 4:57 PM

## 2014-07-29 ENCOUNTER — Encounter: Payer: Self-pay | Admitting: *Deleted

## 2014-07-29 DIAGNOSIS — F419 Anxiety disorder, unspecified: Secondary | ICD-10-CM | POA: Insufficient documentation

## 2014-07-29 DIAGNOSIS — I2510 Atherosclerotic heart disease of native coronary artery without angina pectoris: Secondary | ICD-10-CM | POA: Insufficient documentation

## 2014-07-29 DIAGNOSIS — E079 Disorder of thyroid, unspecified: Secondary | ICD-10-CM | POA: Insufficient documentation

## 2014-07-29 DIAGNOSIS — E785 Hyperlipidemia, unspecified: Secondary | ICD-10-CM | POA: Insufficient documentation

## 2014-07-29 DIAGNOSIS — F329 Major depressive disorder, single episode, unspecified: Secondary | ICD-10-CM | POA: Insufficient documentation

## 2014-07-29 DIAGNOSIS — I4891 Unspecified atrial fibrillation: Secondary | ICD-10-CM | POA: Insufficient documentation

## 2014-07-29 DIAGNOSIS — F32A Depression, unspecified: Secondary | ICD-10-CM | POA: Insufficient documentation

## 2014-08-02 ENCOUNTER — Encounter: Payer: Self-pay | Admitting: Surgery

## 2014-08-02 ENCOUNTER — Ambulatory Visit (INDEPENDENT_AMBULATORY_CARE_PROVIDER_SITE_OTHER): Payer: Medicare Other | Admitting: Surgery

## 2014-08-02 ENCOUNTER — Ambulatory Visit
Admission: RE | Admit: 2014-08-02 | Discharge: 2014-08-02 | Disposition: A | Discharge status: Home / Self Care | Payer: Medicare Other | Source: Ambulatory Visit | Attending: Surgery | Admitting: Surgery

## 2014-08-02 ENCOUNTER — Other Ambulatory Visit: Payer: Self-pay | Admitting: Surgery

## 2014-08-02 VITALS — BP 118/65 | HR 68 | Temp 98.4°F | Ht 61.0 in | Wt 163.0 lb

## 2014-08-02 DIAGNOSIS — M7989 Other specified soft tissue disorders: Secondary | ICD-10-CM

## 2014-08-02 DIAGNOSIS — K5720 Diverticulitis of large intestine with perforation and abscess without bleeding: Secondary | ICD-10-CM | POA: Diagnosis not present

## 2014-08-02 DIAGNOSIS — M79671 Pain in right foot: Secondary | ICD-10-CM

## 2014-08-02 NOTE — Addendum Note (Signed)
Addended by: Marlyce Huge A on: 08/02/2014 10:06 AM   Modules accepted: Orders

## 2014-08-02 NOTE — Progress Notes (Signed)
Surgery Progress Note  S: Ms. Vannorman doing well from abdominal pain standpoint since discharge 5 days ago.  Tolerating diet.  Some loose stools.  Min pain.  2 episodes of nausea.  NO fevers/chills, night sweats, shortness of breath, chest pain, dysuria/hematuria.    Active Ambulatory Problems    Diagnosis Date Noted  . Diverticulitis 07/23/2014  . Disease of thyroid gland 07/29/2014  . Lumbar canal stenosis 12/13/2013  . Neuritis or radiculitis due to rupture of lumbar intervertebral disc 12/13/2013  . HLD (hyperlipidemia) 07/29/2014  . Clinical depression 07/29/2014  . Arteriosclerosis of coronary artery 07/29/2014  . Edema leg 07/08/2014  . A-fib 07/29/2014  . Anxiety 07/29/2014  . Acute bronchitis due to infection 01/24/2014   Resolved Ambulatory Problems    Diagnosis Date Noted  . No Resolved Ambulatory Problems   Past Medical History  Diagnosis Date  . Cancer   . Coronary artery disease   . Afib   . Hyperlipemia   . Thyroid disease    Allergies  Allergen Reactions  . Sulfa Antibiotics Hives and Rash   Family History  Problem Relation Age of Onset  . Heart Problems Mother   . Heart Problems Father   . Diabetes Brother    History   Social History  . Marital Status: Single    Spouse Name: N/A  . Number of Children: N/A  . Years of Education: N/A   Occupational History  . Not on file.   Social History Main Topics  . Smoking status: Never Smoker   . Smokeless tobacco: Never Used  . Alcohol Use: No  . Drug Use: No  . Sexual Activity: Not on file   Other Topics Concern  . Not on file   Social History Narrative   Review of systems.  Full review of systems obtained.  Pertinent positives and negatives per HPI.  Blood pressure 118/65, pulse 68, temperature 98.4 F (36.9 C), temperature source Oral, height 5\' 1"  (1.549 m), weight 73.936 kg (163 lb). GEN: NAD/A&Ox3 FACE: no obvious facial trauma, normal external nose, normal external ears EYES: no scleral  icterus, no conjunctivitis HEAD: normocephalic atraumatic CV: RRR, no MRG RESP: moving air well, lungs clear ABD: soft, nontender, nondistended EXT: moving all ext well, strength 5/5, right leg mildly swollen, right foot without erythema, swollen, tender to palpation, + pitting edema NEURO: cnII-XII grossly intact, sensation intact all 4 ext  Labs: no new labs Imaging: no new imaging  A/P 81 yo recent admit and discharge for diverticulitis.  Abd pain resolved, tolerating diet.  Having good BM.  Significant RLE swelling and pain.  On lasix began as inpatient.  Will obtain xray of foot and RLE ultrasound.  Will have follow up in 2 weeks with Dr. Pat Patrick and have follow up with Dr. Ginette Pitman.

## 2014-08-02 NOTE — Patient Instructions (Signed)
Do not drive on pain medications Call or return to ER if you develop fever greater than 101.5, nausea/vomiting, increased pain, redness/drainage

## 2014-08-03 ENCOUNTER — Telehealth: Payer: Self-pay

## 2014-08-03 ENCOUNTER — Encounter: Payer: Self-pay | Admitting: Emergency Medicine

## 2014-08-03 ENCOUNTER — Telehealth: Payer: Self-pay | Admitting: *Deleted

## 2014-08-03 ENCOUNTER — Emergency Department
Admission: EM | Admit: 2014-08-03 | Discharge: 2014-08-03 | Disposition: A | Discharge status: Home / Self Care | Payer: Medicare Other | Attending: Emergency Medicine | Admitting: Emergency Medicine

## 2014-08-03 DIAGNOSIS — Z7982 Long term (current) use of aspirin: Secondary | ICD-10-CM | POA: Insufficient documentation

## 2014-08-03 DIAGNOSIS — Z792 Long term (current) use of antibiotics: Secondary | ICD-10-CM | POA: Diagnosis not present

## 2014-08-03 DIAGNOSIS — Z79899 Other long term (current) drug therapy: Secondary | ICD-10-CM | POA: Diagnosis not present

## 2014-08-03 DIAGNOSIS — M109 Gout, unspecified: Secondary | ICD-10-CM | POA: Diagnosis not present

## 2014-08-03 DIAGNOSIS — R2242 Localized swelling, mass and lump, left lower limb: Secondary | ICD-10-CM | POA: Diagnosis present

## 2014-08-03 MED ORDER — IBUPROFEN 600 MG PO TABS: 600.0000 mg | ORAL_TABLET | Freq: Four times a day (QID) | ORAL | Status: DC | PRN

## 2014-08-03 NOTE — ED Provider Notes (Signed)
[facility] Emergency Department Provider Note    ____________________________________________  Time seen: 1010  I have reviewed the triage vital signs and the nursing notes.   HISTORY  Chief Complaint Leg Swelling   History limited by: Not Limited   HPI [name] is a 81 y.o. female who presents to the emergency department today with continued bilateral flank pain. Patient states the pain is been going on at least for the past 6 or 7 days. She states it is now gotten worse. It is a sharp pain. She has had some lower extremity swelling. States she has had similar symptoms in the past. Was recently restarted on a diuretic. States it does not help the pain. Is being followed by surgery for possible colectomy secondary to diverticular disease. Currently on antibiotics for recent stay for diverticulitis. Denies any recent fevers.     Past Medical History  Diagnosis Date  . Cancer   . Coronary artery disease   . Afib   . Hyperlipemia   . Thyroid disease   . Anxiety     Patient Active Problem List   Diagnosis Date Noted  . Disease of thyroid gland 07/29/2014  . HLD (hyperlipidemia) 07/29/2014  . Clinical depression 07/29/2014  . Arteriosclerosis of coronary artery 07/29/2014  . A-fib 07/29/2014  . Anxiety 07/29/2014  . Diverticulitis 07/23/2014  . Edema leg 07/08/2014  . Acute bronchitis due to infection 01/24/2014  . Lumbar canal stenosis 12/13/2013  . Neuritis or radiculitis due to rupture of lumbar intervertebral disc 12/13/2013    Past Surgical History  Procedure Laterality Date  . Abdominal hysterectomy    . Coronary stent placement      Current Outpatient Rx  Name  Route  Sig  Dispense  Refill  . aspirin EC 81 MG tablet   Oral   Take 81 mg by mouth daily.         . cephALEXin (KEFLEX) 500 MG capsule   Oral   Take 1 capsule (500 mg total) by mouth 3 (three) times daily.   21 capsule   0   . fluticasone (FLONASE) 50  MCG/ACT nasal spray            3   . furosemide (LASIX) 40 MG tablet   Oral   Take 1 tablet (40 mg total) by mouth daily.   30 tablet   1   . HYDROcodone-acetaminophen (NORCO/VICODIN) 5-325 MG per tablet   Oral   Take 1-2 tablets by mouth every 4 (four) hours as needed for moderate pain.   30 tablet   0   . ibuprofen (ADVIL,MOTRIN) 600 MG tablet   Oral   Take 1 tablet (600 mg total) by mouth every 6 (six) hours as needed for moderate pain.   20 tablet   0   . levothyroxine (SYNTHROID, LEVOTHROID) 25 MCG tablet   Oral   Take 1 tablet (25 mcg total) by mouth daily before breakfast. Take 30-60 minutes before breakfast,   30 tablet   5   . lovastatin (MEVACOR) 40 MG tablet   Oral   Take 40 mg by mouth at bedtime.         . metroNIDAZOLE (FLAGYL) 500 MG tablet   Oral   Take 1 tablet (500 mg total) by mouth 3 (three) times daily.   21 tablet   0   . nitroGLYCERIN (NITROSTAT) 0.4 MG SL tablet   Sublingual   Place 0.4 mg under the tongue every 5 (  five) minutes x 3 doses as needed. For chest pain         . Omega-3 Fatty Acids (FISH OIL) 1000 MG CAPS   Oral   Take 1,000 mg by mouth daily.         . potassium chloride (K-DUR,KLOR-CON) 10 MEQ tablet   Oral   Take 10 mEq by mouth 2 (two) times daily.         . promethazine (PHENERGAN) 25 MG tablet   Oral   Take 25 mg by mouth every 6 (six) hours as needed for nausea or vomiting.         Marland Kitchen rOPINIRole (REQUIP) 0.5 MG tablet   Oral   Take 0.5 mg by mouth at bedtime.         . traMADol (ULTRAM) 50 MG tablet   Oral   Take 50 mg by mouth every 8 (eight) hours as needed. For back pain.           Allergies Sulfa antibiotics  Family History  Problem Relation Age of Onset  . Heart Problems Mother   . Heart Problems Father   . Diabetes Brother     Social History History  Substance Use Topics  . Smoking status: Never Smoker   . Smokeless tobacco: Never Used  . Alcohol Use: No    Review of  Systems  Constitutional: Negative for fever. Cardiovascular: Negative for chest pain. Respiratory: Negative for shortness of breath. Gastrointestinal: Negative for abdominal pain, vomiting and diarrhea. Genitourinary: Negative for dysuria. Musculoskeletal: Negative for back pain. Bilateral foot pain Skin: Negative for rash. Neurological: Negative for headaches, focal weakness or numbness.   10-point ROS otherwise negative.  ____________________________________________   PHYSICAL EXAM:  VITAL SIGNS: ED Triage Vitals  Enc Vitals Group     BP 08/03/14 0914 125/63 mmHg     Pulse Rate 08/03/14 0915 87     Resp 08/03/14 0925 18     Temp 08/03/14 0925 98.2 F (36.8 C)     Temp Source 08/03/14 0925 Oral     SpO2 08/03/14 0915 95 %     Weight 08/03/14 0925 161 lb (73.029 kg)     Height 08/03/14 0925 5\' 1"  (1.549 m)     Head Cir --      Peak Flow --      Pain Score 08/03/14 0926 7   Constitutional: Alert and oriented. Well appearing and in no distress. Eyes: Conjunctivae are normal. PERRL. Normal extraocular movements. ENT   Head: Normocephalic and atraumatic.   Nose: No congestion/rhinnorhea.   Mouth/Throat: Mucous membranes are moist.   Neck: No stridor. Hematological/Lymphatic/Immunilogical: No cervical lymphadenopathy. Cardiovascular: Normal rate, regular rhythm.   Respiratory: Normal respiratory effort without tachypnea nor retractions.  Gastrointestinal: Soft and nontender. No distention.  Genitourinary: Deferred Musculoskeletal: Normal range of motion in all extremities. Patient with inflamed, red, extremely tender left lower first digit. Initially some redness and tenderness to right lower first digit. Neurologic:  Normal speech and language. No gross focal neurologic deficits are appreciated. Speech is normal.  Skin:  Skin is warm, dry and intact.  Psychiatric: Mood and affect are normal. Speech and behavior are normal. Patient exhibits appropriate  insight and judgment.  ____________________________________________    LABS (pertinent positives/negatives)  None  ____________________________________________   EKG  None  ____________________________________________    RADIOLOGY  None  ____________________________________________   PROCEDURES  Procedure(s) performed: None  Critical Care performed: No  ____________________________________________   INITIAL IMPRESSION / ASSESSMENT AND PLAN /  ED COURSE  Pertinent labs & imaging results that were available during my care of the patient were reviewed by me and considered in my medical decision making (see chart for details).  Patient here with bilateral foot pain. On exam patient does appear to have findings consistent with gout. Left toe worse than right foot. She did have lower extremity Dopplers done yesterday which out any sign of a clot. Discussed return part cautions with patient. Will give NSAIDs and discharge home.  ____________________________________________   FINAL CLINICAL IMPRESSION(S) / ED DIAGNOSES  Final diagnoses:  Acute gout of left foot, unspecified cause     Nance Pear, MD 08/03/14 1032

## 2014-08-03 NOTE — Telephone Encounter (Signed)
Patient's sister [name] called with concerns about her sister. [name] reported that her sister has been getting worse since yesterday and wanted to know the next course of action. I informed [name] that she should take her sister immediately to the ED for treatment. [name] confirmed understanding of direction to go to the ED.

## 2014-08-03 NOTE — Discharge Instructions (Signed)
Please seek medical attention for any high fevers, chest pain, shortness of breath, change in behavior, persistent vomiting, bloody stool or any other new or concerning symptoms.  Gout Gout is an inflammatory arthritis caused by a buildup of uric acid crystals in the joints. Uric acid is a chemical that is normally present in the blood. When the level of uric acid in the blood is too high it can form crystals that deposit in your joints and tissues. This causes joint redness, soreness, and swelling (inflammation). Repeat attacks are common. Over time, uric acid crystals can form into masses (tophi) near a joint, destroying bone and causing disfigurement. Gout is treatable and often preventable. CAUSES  The disease begins with elevated levels of uric acid in the blood. Uric acid is produced by your body when it breaks down a naturally found substance called purines. Certain foods you eat, such as meats and fish, contain high amounts of purines. Causes of an elevated uric acid level include:  Being passed down from parent to child (heredity).  Diseases that cause increased uric acid production (such as obesity, psoriasis, and certain cancers).  Excessive alcohol use.  Diet, especially diets rich in meat and seafood.  Medicines, including certain cancer-fighting medicines (chemotherapy), water pills (diuretics), and aspirin.  Chronic kidney disease. The kidneys are no longer able to remove uric acid well.  Problems with metabolism. Conditions strongly associated with gout include:  Obesity.  High blood pressure.  High cholesterol.  Diabetes. Not everyone with elevated uric acid levels gets gout. It is not understood why some people get gout and others do not. Surgery, joint injury, and eating too much of certain foods are some of the factors that can lead to gout attacks. SYMPTOMS   An attack of gout comes on quickly. It causes intense pain with redness, swelling, and warmth in a  joint.  Fever can occur.  Often, only one joint is involved. Certain joints are more commonly involved:  Base of the big toe.  Knee.  Ankle.  Wrist.  Finger. Without treatment, an attack usually goes away in a few days to weeks. Between attacks, you usually will not have symptoms, which is different from many other forms of arthritis. DIAGNOSIS  Your caregiver will suspect gout based on your symptoms and exam. In some cases, tests may be recommended. The tests may include:  Blood tests.  Urine tests.  X-rays.  Joint fluid exam. This exam requires a needle to remove fluid from the joint (arthrocentesis). Using a microscope, gout is confirmed when uric acid crystals are seen in the joint fluid. TREATMENT  There are two phases to gout treatment: treating the sudden onset (acute) attack and preventing attacks (prophylaxis).  Treatment of an Acute Attack.  Medicines are used. These include anti-inflammatory medicines or steroid medicines.  An injection of steroid medicine into the affected joint is sometimes necessary.  The painful joint is rested. Movement can worsen the arthritis.  You may use warm or cold treatments on painful joints, depending which works best for you.  Treatment to Prevent Attacks.  If you suffer from frequent gout attacks, your caregiver may advise preventive medicine. These medicines are started after the acute attack subsides. These medicines either help your kidneys eliminate uric acid from your body or decrease your uric acid production. You may need to stay on these medicines for a very long time.  The early phase of treatment with preventive medicine can be associated with an increase in acute gout attacks.  For this reason, during the first few months of treatment, your caregiver may also advise you to take medicines usually used for acute gout treatment. Be sure you understand your caregiver's directions. Your caregiver may make several adjustments  to your medicine dose before these medicines are effective.  Discuss dietary treatment with your caregiver or dietitian. Alcohol and drinks high in sugar and fructose and foods such as meat, poultry, and seafood can increase uric acid levels. Your caregiver or dietitian can advise you on drinks and foods that should be limited. HOME CARE INSTRUCTIONS   Do not take aspirin to relieve pain. This raises uric acid levels.  Only take over-the-counter or prescription medicines for pain, discomfort, or fever as directed by your caregiver.  Rest the joint as much as possible. When in bed, keep sheets and blankets off painful areas.  Keep the affected joint raised (elevated).  Apply warm or cold treatments to painful joints. Use of warm or cold treatments depends on which works best for you.  Use crutches if the painful joint is in your leg.  Drink enough fluids to keep your urine clear or pale yellow. This helps your body get rid of uric acid. Limit alcohol, sugary drinks, and fructose drinks.  Follow your dietary instructions. Pay careful attention to the amount of protein you eat. Your daily diet should emphasize fruits, vegetables, whole grains, and fat-free or low-fat milk products. Discuss the use of coffee, vitamin C, and cherries with your caregiver or dietitian. These may be helpful in lowering uric acid levels.  Maintain a healthy body weight. SEEK MEDICAL CARE IF:   You develop diarrhea, vomiting, or any side effects from medicines.  You do not feel better in 24 hours, or you are getting worse. SEEK IMMEDIATE MEDICAL CARE IF:   Your joint becomes suddenly more tender, and you have chills or a fever. MAKE SURE YOU:   Understand these instructions.  Will watch your condition.  Will get help right away if you are not doing well or get worse. Document Released: 02/09/2000 Document Revised: 06/28/2013 Document Reviewed: 09/25/2011 [facility] Patient Information 2015 [place], [place].  This information is not intended to replace advice given to you by your health care provider. Make sure you discuss any questions you have with your health care provider.

## 2014-08-03 NOTE — Telephone Encounter (Signed)
Spoke with Radiology Tech and was given preliminary negative results on BLE dopplers. I explained that she could let patient go at that time.

## 2014-08-03 NOTE — ED Notes (Signed)
Patient to ER for c/o BLE redness and swelling. Patient has had same issue x3 months and has been followed by Dr. Pat Patrick. Patient states she was to have some kind of surgery, but was unable to because of abdomen infection. Patient is unsure of what abdomen infection was or what surgery she was to have. Patient describes pain as throbbing. Per patient, patient saw Dr. Pat Patrick yesterday and was to be refferred to another MD d/t pain. Unsure of what kind of MD.

## 2014-08-24 ENCOUNTER — Ambulatory Visit: Payer: 59 | Admitting: Surgery

## 2014-08-25 ENCOUNTER — Other Ambulatory Visit
Admission: RE | Admit: 2014-08-25 | Discharge: 2014-08-25 | Disposition: A | Discharge status: Home / Self Care | Payer: Medicare Other | Source: Ambulatory Visit | Attending: Surgery | Admitting: Surgery

## 2014-08-25 ENCOUNTER — Ambulatory Visit (INDEPENDENT_AMBULATORY_CARE_PROVIDER_SITE_OTHER): Payer: Medicare Other | Admitting: Surgery

## 2014-08-25 ENCOUNTER — Encounter: Payer: Self-pay | Admitting: Surgery

## 2014-08-25 VITALS — BP 118/69 | HR 76 | Temp 98.3°F | Ht 61.0 in | Wt 148.0 lb

## 2014-08-25 DIAGNOSIS — R109 Unspecified abdominal pain: Secondary | ICD-10-CM | POA: Diagnosis present

## 2014-08-25 DIAGNOSIS — Z8719 Personal history of other diseases of the digestive system: Secondary | ICD-10-CM | POA: Insufficient documentation

## 2014-08-25 DIAGNOSIS — K5732 Diverticulitis of large intestine without perforation or abscess without bleeding: Secondary | ICD-10-CM | POA: Diagnosis not present

## 2014-08-25 LAB — COMPREHENSIVE METABOLIC PANEL
ALK PHOS: 86 U/L (ref 38–126)
ALT: 21 U/L (ref 14–54)
ANION GAP: 10 (ref 5–15)
AST: 33 U/L (ref 15–41)
Albumin: 3.9 g/dL (ref 3.5–5.0)
BUN: 15 mg/dL (ref 6–20)
CO2: 28 mmol/L (ref 22–32)
CREATININE: 1.35 mg/dL — AB (ref 0.44–1.00)
Calcium: 9.2 mg/dL (ref 8.9–10.3)
Chloride: 98 mmol/L — ABNORMAL LOW (ref 101–111)
GFR calc Af Amer: 42 mL/min — ABNORMAL LOW (ref >60)
GFR calc non Af Amer: 36 mL/min — ABNORMAL LOW (ref >60)
Glucose, Bld: 96 mg/dL (ref 65–99)
Potassium: 3.3 mmol/L — ABNORMAL LOW (ref 3.5–5.1)
Sodium: 136 mmol/L (ref 135–145)
TOTAL PROTEIN: 8.2 g/dL — AB (ref 6.5–8.1)
Total Bilirubin: 0.5 mg/dL (ref 0.3–1.2)

## 2014-08-25 LAB — CBC WITH DIFFERENTIAL/PLATELET
BASOS ABS: 0 10*3/uL (ref 0–0.1)
Basophils Relative: 1 %
Eosinophils Absolute: 0.2 10*3/uL (ref 0–0.7)
Eosinophils Relative: 2 %
HEMATOCRIT: 40.2 % (ref 35.0–47.0)
Hemoglobin: 13.1 g/dL (ref 12.0–16.0)
LYMPHS ABS: 2.1 10*3/uL (ref 1.0–3.6)
Lymphocytes Relative: 24 %
MCH: 29.6 pg (ref 26.0–34.0)
MCHC: 32.6 g/dL (ref 32.0–36.0)
MCV: 90.8 fL (ref 80.0–100.0)
MONO ABS: 1 10*3/uL — AB (ref 0.2–0.9)
MONOS PCT: 12 %
Neutro Abs: 5.4 10*3/uL (ref 1.4–6.5)
Neutrophils Relative %: 61 %
Platelets: 270 10*3/uL (ref 150–440)
RBC: 4.43 MIL/uL (ref 3.80–5.20)
RDW: 14.4 % (ref 11.5–14.5)
WBC: 8.6 10*3/uL (ref 3.6–11.0)

## 2014-08-25 NOTE — Patient Instructions (Signed)
Please go and have your blood work and Abdominal and Pelvis CT Scan done. After we have results, we will call you to follow up with Dr. Pat Patrick to discuss further.

## 2014-08-25 NOTE — Progress Notes (Signed)
Surgery Clinic Note  S: Doing well.  Feels weak and tired, however.  Tolerating diet.  Having good BM.  No fevers/chills, night sweats, shortness of breath, cough, abdominal pain, nausea/vomiting, diarrhea/constipation, dysuria/hematuria.  Blood pressure 118/69, pulse 76, temperature 98.3 F (36.8 C), temperature source Oral, height 5\' 1"  (1.549 m), weight 148 lb (67.132 kg). 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  Colonoscopy in Oct 2015 shows diverticulitis.  Will obtain CT a/p to eval for any retained abscess and for gas in bladder which would indicate that there is indeed a colovesicular fistula.  F/u with Dr. Pat Patrick to discuss possible colectomy

## 2014-08-26 ENCOUNTER — Ambulatory Visit
Admission: RE | Admit: 2014-08-26 | Discharge: 2014-08-26 | Disposition: A | Discharge status: Home / Self Care | Payer: Medicare Other | Source: Ambulatory Visit | Attending: Surgery | Admitting: Surgery

## 2014-08-26 DIAGNOSIS — K5780 Diverticulitis of intestine, part unspecified, with perforation and abscess without bleeding: Secondary | ICD-10-CM | POA: Diagnosis not present

## 2014-08-26 DIAGNOSIS — K5732 Diverticulitis of large intestine without perforation or abscess without bleeding: Secondary | ICD-10-CM | POA: Diagnosis present

## 2014-08-26 MED ORDER — IOHEXOL 300 MG/ML  SOLN
75.0000 mL | Freq: Once | INTRAMUSCULAR | Status: AC | PRN
  Administered 2014-08-26: 100 mL via INTRAVENOUS

## 2014-09-01 ENCOUNTER — Telehealth: Payer: Self-pay

## 2014-09-01 MED ORDER — METRONIDAZOLE 500 MG PO TABS: 500.0000 mg | ORAL_TABLET | Freq: Three times a day (TID) | ORAL | Status: AC

## 2014-09-01 MED ORDER — CIPROFLOXACIN HCL 500 MG PO TABS: 500.0000 mg | ORAL_TABLET | Freq: Two times a day (BID) | ORAL | Status: AC

## 2014-09-01 NOTE — Telephone Encounter (Signed)
Spoke with Dr. Rexene Edison regarding results to CT scan completed on 08/26/14. He states that he would like to give her another course of:  Cipro 500mg  PO BID and Flagyl 500mg  PO TID x 10 days. Both were sent to [facility] on [facility] [address] [facility].  Then have patient follow-up with Dr. Pat Patrick, next available date.  Called patient to review results and explain treatment and follow-up needed. Explained to patient if she develops severe pain like before, she needs to call our office immediately or go to the ED if it is after hours.  She verbalizes understanding.

## 2014-09-02 ENCOUNTER — Telehealth: Payer: Self-pay

## 2014-09-02 NOTE — Telephone Encounter (Signed)
Call from Daughter asking what conversation entailed yesterday. Her mother could not hear everything that was explained to her over the phone. Explained everything once again to the patient's daughter.  She states that she will pick up antibiotics today and make sure that her mother takes them.  Also she changed appointment as she is patient's transportation and is unable to bring her during appointment scheduled by patient yesterday.

## 2014-09-19 ENCOUNTER — Ambulatory Visit (INDEPENDENT_AMBULATORY_CARE_PROVIDER_SITE_OTHER): Payer: Medicare Other | Admitting: Surgery

## 2014-09-19 ENCOUNTER — Encounter: Payer: Self-pay | Admitting: Surgery

## 2014-09-19 VITALS — BP 128/71 | HR 76 | Temp 98.2°F | Ht 61.0 in | Wt 149.0 lb

## 2014-09-19 DIAGNOSIS — K5720 Diverticulitis of large intestine with perforation and abscess without bleeding: Secondary | ICD-10-CM | POA: Diagnosis not present

## 2014-09-19 NOTE — Patient Instructions (Signed)
Give [place] a call if you have any questions or concerns.

## 2014-09-19 NOTE — Progress Notes (Signed)
Surgical Consultation  09/19/2014  [name] is an 81 y.o. female. Admitted to the hospital with perforated diverticulitis. She returns for follow-up evaluation.  Chief Complaint  Patient presents with  . Follow-up    Diverticulitis     HPI: She was hospitalized several weeks ago for perforated diverticulitis which resolved with antibiotic therapy. She had a CT scan early in July which demonstrated a 2 x 2 centimeter abscess in the pelvis just above her bladder adjacent to her sigmoid colon consistent with a small abscess. Currently, she is asymptomatic with regard to her abdominal system. She does not have any nausea vomiting diarrhea abdominal pain or voiding problems.  Her biggest complaints at the present time or back the feet pain. She has been evaluated by the orthopedic department and has undergone several injections in her back. Carries a diagnosis of gout that the current symptoms do not suggest gas.  She denies any fever or chills. She has not had any further abdominal symptoms. Her daughter is with her and confirms that history.  Past Medical History  Diagnosis Date  . Coronary artery disease   . Afib   . Hyperlipemia   . Thyroid disease   . Anxiety   . Cancer     Lymphoma     Past Surgical History  Procedure Laterality Date  . Abdominal hysterectomy    . Coronary stent placement      Family History  Problem Relation Age of Onset  . Heart Problems Mother   . Heart Problems Father   . Diabetes Brother     Social History:  reports that she has never smoked. She has never used smokeless tobacco. She reports that she does not drink alcohol or use illicit drugs.  Allergies:  Allergies  Allergen Reactions  . Sulfa Antibiotics Hives and Rash    Medications reviewed.     ROS other than the symptoms of back pain joint pain noted above her review of symptoms is unremarkable.     BP 128/71 mmHg  Pulse 76  Temp(Src) 98.2 F (36.8 C) (Oral)  Ht 5'  1" (1.549 m)  Wt 149 lb (67.586 kg)  BMI 28.17 kg/m2  Physical Exam  Constitutional: She is oriented to person, place, and time and well-developed, well-nourished, and in no distress. No distress.  HENT:  Head: Normocephalic and atraumatic.  Eyes: Conjunctivae are normal. Pupils are equal, round, and reactive to light.  Neck: Normal range of motion. Neck supple.  Cardiovascular: Regular rhythm and normal heart sounds.   Pulmonary/Chest: Effort normal and breath sounds normal.  Abdominal: Soft. Bowel sounds are normal. There is no tenderness. There is no guarding.  Musculoskeletal: She exhibits no edema or tenderness.  She does not appear to have any knee or foot joint swelling.  Neurological: She is alert and oriented to person, place, and time.  Skin: Skin is warm and dry.  Psychiatric: Mood and affect normal.      No results found for this or any previous visit (from the past 48 hour(s)). No results found.  Assessment/Plan: 1. Diverticulitis of large intestine with perforation without bleeding I talk with her at length. She is nearly 81 years old and does not want to consider any elective surgery at this point. I cautioned her about the possibility of an advancing infection or systemic sepsis from her abscess but she and her daughter feel that the patient is improving. Her biggest complaints. B joint and back problems. I encouraged her to follow  up with her primary care doctor or her orthopedic surgeon. If she has any further GI symptoms I would recommend surgical excision possible Hartman's procedure. We will follow her up in 2 months time to make certain that she's not develop any further problems. Patient and daughter were in agreement.   Dia Crawford III dermatitis

## 2014-09-22 ENCOUNTER — Ambulatory Visit: Payer: Self-pay | Admitting: Surgery

## 2014-12-22 ENCOUNTER — Other Ambulatory Visit: Payer: Self-pay | Admitting: Surgery

## 2015-01-30 ENCOUNTER — Ambulatory Visit (INDEPENDENT_AMBULATORY_CARE_PROVIDER_SITE_OTHER): Payer: Medicare Other | Admitting: Obstetrics and Gynecology

## 2015-01-30 ENCOUNTER — Encounter: Payer: Self-pay | Admitting: Obstetrics and Gynecology

## 2015-01-30 VITALS — BP 155/73 | HR 69 | Resp 16 | Ht 61.0 in | Wt 156.3 lb

## 2015-01-30 DIAGNOSIS — R319 Hematuria, unspecified: Secondary | ICD-10-CM

## 2015-01-30 DIAGNOSIS — R1030 Lower abdominal pain, unspecified: Secondary | ICD-10-CM | POA: Diagnosis not present

## 2015-01-30 DIAGNOSIS — R310 Gross hematuria: Secondary | ICD-10-CM

## 2015-01-30 LAB — URINALYSIS, COMPLETE
Bilirubin, UA: NEGATIVE
GLUCOSE, UA: NEGATIVE
Ketones, UA: NEGATIVE
NITRITE UA: NEGATIVE
Protein, UA: NEGATIVE
SPEC GRAV UA: 1.02 (ref 1.005–1.030)
Urobilinogen, Ur: 0.2 mg/dL (ref 0.2–1.0)
pH, UA: 6.5 (ref 5.0–7.5)

## 2015-01-30 LAB — MICROSCOPIC EXAMINATION: RBC, UA: NONE SEEN /hpf (ref 0–?)

## 2015-01-30 LAB — BLADDER SCAN AMB NON-IMAGING: Scan Result: 19

## 2015-01-30 NOTE — Progress Notes (Signed)
01/30/2015 9:57 AM   [name] 11/24/1932 [id number]  Referring provider: Tracie Harrier, MD [address] [facility] [place], [place] [address]  Chief Complaint  Patient presents with  . Urinary Tract Infection  . Hematuria  . Establish Care    HPI: Patient is a 81 year old female with a history of gout, non-Hodgkin's lymphoma, depression, hypothyroidism, CAD, and HLD presenting today with complaints of suprapubic and lower back discomfort as well as one episode of gross hematuria.  1. Suprapubic pain- Patient describes suprapubic pain and has sensation of pressure and swelling over the last several months. History of diverticular abscess measuring 2.2 x 2.2 x 2.4 cm superior to the urinary bladder seen on CT scan in July of this year. She states that she has been taking pain medications for "kidney pain". She cannot recall why the pain medications were originally prescribed. She reports her bilateral back pain has been Occurring for several months as well. Pain is described as intermittent and sharp radiating down bilateral legs. Ambulation worsens pain.  Urinary symptoms include urgency and urge incontinence.  2. Gross Hematuria- Episode of gross hematuria occurred approximately 2 months ago. Patient states that she noticed a large amount of blood in the toilet after urination. She has not experienced any further episodes. No fevers, chills or unexplained weight loss. Never smoker.  No history of history of stones. S/p abdominal hysterectomy.  History of gout. Severe attack recently per patient.  Urine Cultures 12/21/14  >100,00 Kleb pneumoniae > UA 0-3 RBCs 01/18/15 no growth  > UA 0-3 RBCs 08/16/14 UA 4-10 RBCs > no culture   08/26/14 CT A/P with contrast IMPRESSION: Improved inflammatory changes of sigmoid diverticulitis. Small extraluminal gas and fluid collection contiguous with the sigmoid colon and the superior aspect of urinary bladder  compatible with a diverticular abscess 2.2 x 2.2 x 2.4 cm in size.  PMH: Past Medical History  Diagnosis Date  . Coronary artery disease   . Afib ([place])   . Hyperlipemia   . Thyroid disease   . Anxiety   . Cancer [facility])     Lymphoma     Surgical History: Past Surgical History  Procedure Laterality Date  . Abdominal hysterectomy    . Coronary stent placement      Home Medications:    Medication List       This list is accurate as of: 01/30/15  9:57 AM.  Always use your most recent med list.               allopurinol 100 MG tablet  Commonly known as:  ZYLOPRIM  Take 1 tablet by mouth.     aspirin EC 81 MG tablet  Take 81 mg by mouth daily.     cephALEXin 500 MG capsule  Commonly known as:  KEFLEX  Take 1 capsule (500 mg total) by mouth 3 (three) times daily.     colchicine 0.6 MG tablet  TAKE 1 TABLET (0.6 MG TOTAL) BY MOUTH 2 (TWO) TIMES DAILY.     Fish Oil 1000 MG Caps  Take 1,000 mg by mouth daily.     fluticasone 50 MCG/ACT nasal spray  Commonly known as:  FLONASE     furosemide 40 MG tablet  Commonly known as:  LASIX  Take 1 tablet (40 mg total) by mouth daily.     gabapentin 300 MG capsule  Commonly known as:  NEURONTIN  Take 300 mg by mouth 2 (two) times daily.  HYDROcodone-acetaminophen 5-325 MG tablet  Commonly known as:  NORCO/VICODIN  Take 1-2 tablets by mouth every 4 (four) hours as needed for moderate pain.     ibuprofen 600 MG tablet  Commonly known as:  ADVIL,MOTRIN  Take 1 tablet (600 mg total) by mouth every 6 (six) hours as needed for moderate pain.     levothyroxine 25 MCG tablet  Commonly known as:  SYNTHROID, LEVOTHROID  Take 1 tablet (25 mcg total) by mouth daily before breakfast. Take 30-60 minutes before breakfast,     lovastatin 40 MG tablet  Commonly known as:  MEVACOR  Take 40 mg by mouth at bedtime.     nitroGLYCERIN 0.4 MG SL tablet  Commonly known as:  NITROSTAT  Place 0.4 mg under the tongue every 5 (five)  minutes x 3 doses as needed. For chest pain     pantoprazole 40 MG tablet  Commonly known as:  PROTONIX  Take 40 mg by mouth daily.     potassium chloride 10 MEQ tablet  Commonly known as:  K-DUR,KLOR-CON  Take 10 mEq by mouth 2 (two) times daily.     promethazine 25 MG tablet  Commonly known as:  PHENERGAN  Take 25 mg by mouth every 6 (six) hours as needed for nausea or vomiting.     rOPINIRole 0.5 MG tablet  Commonly known as:  REQUIP  Take 0.5 mg by mouth at bedtime.     traMADol 50 MG tablet  Commonly known as:  ULTRAM  Take 50 mg by mouth every 8 (eight) hours as needed. For back pain.        Allergies:  Allergies  Allergen Reactions  . Sulfa Antibiotics Hives and Rash    Family History: Family History  Problem Relation Age of Onset  . Heart Problems Mother   . Heart Problems Father   . Diabetes Brother     Social History:  reports that she has never smoked. She has never used smokeless tobacco. She reports that she does not drink alcohol or use illicit drugs.  ROS: UROLOGY Frequent Urination?: Yes Hard to postpone urination?: No Burning/pain with urination?: Yes Get up at night to urinate?: Yes Leakage of urine?: Yes Urine stream starts and stops?: No Trouble starting stream?: No Do you have to strain to urinate?: No Blood in urine?: Yes Urinary tract infection?: No Sexually transmitted disease?: No Injury to kidneys or bladder?: No Painful intercourse?: No Weak stream?: No Currently pregnant?: No Vaginal bleeding?: No Last menstrual period?: n  Gastrointestinal Nausea?: No Vomiting?: No Indigestion/heartburn?: No Diarrhea?: No Constipation?: No  Constitutional Fever: No Night sweats?: No Weight loss?: Yes Fatigue?: Yes  Skin Skin rash/lesions?: No Itching?: No  Eyes Blurred vision?: No Double vision?: No  Ears/Nose/Throat Sore throat?: No Sinus problems?: Yes  Hematologic/Lymphatic Swollen glands?: No Easy bruising?:  No  Cardiovascular Leg swelling?: No Chest pain?: No  Respiratory Cough?: No Shortness of breath?: No  Endocrine Excessive thirst?: No  Musculoskeletal Back pain?: Yes Joint pain?: Yes  Neurological Headaches?: No Dizziness?: No  Psychologic Depression?: No Anxiety?: No  Physical Exam: BP 155/73 mmHg  Pulse 69  Resp 16  Ht 5\' 1"  (1.549 m)  Wt 156 lb 4.8 oz (70.897 kg)  BMI 29.55 kg/m2  Constitutional:  Alert and oriented, No acute distress. HEENT: [place] AT, moist mucus membranes.  Trachea midline, no masses. Cardiovascular: No clubbing, cyanosis, or edema. Respiratory: Normal respiratory effort, no increased work of breathing. GI: Abdomen is soft, nontender, nondistended, no  abdominal masses GU: No CVA tenderness.  Pelvic exam: mucosa pale, severe atrophy with narrow introitus, urethra pale and slightly TTP but otherwise normal No obvious prolapse but I was unable to perform speculum exam due severe atrophy, no masses palpated on bimanual exam Skin: No rashes, bruises or suspicious lesions. Lymph: No cervical or inguinal adenopathy. Neurologic: Grossly intact, no focal deficits, moving all 4 extremities. Psychiatric: Normal mood and affect.  Laboratory Data:   Urinalysis Results for orders placed or performed in visit on 01/30/15  Microscopic Examination  Result Value Ref Range   WBC, UA >30 (H) 0 -  5 /hpf   RBC, UA None seen 0 -  2 /hpf   Epithelial Cells (non renal) 0-10 0 - 10 /hpf   Renal Epithel, UA 0-10 (A) None seen /hpf   Bacteria, UA Many (A) None seen/Few  Urinalysis, Complete  Result Value Ref Range   Specific Gravity, UA 1.020 1.005 - 1.030   pH, UA 6.5 5.0 - 7.5   Color, UA Yellow Yellow   Appearance Ur Cloudy (A) Clear   Leukocytes, UA 3+ (A) Negative   Protein, UA Negative Negative/Trace   Glucose, UA Negative Negative   Ketones, UA Negative Negative   RBC, UA 1+ (A) Negative   Bilirubin, UA Negative Negative   Urobilinogen, Ur 0.2 0.2  - 1.0 mg/dL   Nitrite, UA Negative Negative   Microscopic Examination See below:   BLADDER SCAN AMB NON-IMAGING  Result Value Ref Range   Scan Result 19 mL      Pertinent Imaging: CLINICAL DATA: LEFT lower quadrant pain for 3 months, nausea, vomiting, diarrhea, history lymphoma, coronary artery disease post PTCA/ stenting, prior diverticulitis  EXAM: CT ABDOMEN AND PELVIS WITH CONTRAST  TECHNIQUE: Multidetector CT imaging of the abdomen and pelvis was performed using the standard protocol following bolus administration of intravenous contrast. Sagittal and coronal MPR images reconstructed from axial data set.  CONTRAST: 160mL OMNIPAQUE IOHEXOL 300 MG/ML SOLN IV. Dilute oral contrast.  COMPARISON: 07/23/2014  FINDINGS: Lung bases clear.  Small BILATERAL renal cysts. Serosal calcifications at liver. Liver, spleen, pancreas, kidneys, and adrenal glands normal. Questionable wall thickening of the cecum versus artifact from underdistention, favor artifact as this site was normal in appearance on the most recent CT. Appendix not visualized. Diverticulosis of sigmoid and descending colon with persistent mid sigmoid wall thickening. Mild pericolic infiltrative changes, improved since 07/23/2014.  Small gas and fluid collection adjacent to the sigmoid colon and the cranial aspect of the urinary bladder 2.2 x 2.2 x 2.4 cm in size with adjacent superior bladder wall thickening.  Findings are consistent with a small diverticular abscess secondary to previously identified sigmoid diverticulitis.  No free intraperitoneal air or fluid.  Bladder and ureters otherwise unremarkable.  Uterus surgically absent with nonvisualization of ovaries. No additional abnormal gas or fluid collections. Stomach and small bowel loops unremarkable. Bones demineralized.  IMPRESSION: Improved inflammatory changes of sigmoid diverticulitis.  Small extraluminal gas and fluid  collection contiguous with the sigmoid colon and the superior aspect of urinary bladder compatible with a diverticular abscess 2.2 x 2.2 x 2.4 cm in size.  Electronically Signed  By: Lavonia Dana M.D.  On: 08/26/2014 11:41  Assessment & Plan:    1.  Gross Hematuria- We discussed the differential diagnosis for hematuria including nephrolithiasis, renal or upper tract tumors, bladder stones, UTIs, or bladder tumors as well as undetermined etiologies. Per AUA guidelines, I did recommend complete hematuria evaluation including CTU, possible urine  cytology, and office cystoscopy. -CT Urogram -cystoscopy - Urinalysis, Complete -Urine Culture -BMP - BLADDER SCAN AMB NON-IMAGING  2. Suprapubic pain-  PVR 12mL.  History of diverticular abscess measuring 2.2 x 2.2 x 2.4 cm superior to the urinary bladder seen on CT scan in July of this year. There has been concern for colovesicular fistula in the past per Dr. Rexene Edison note.   Return for CT Urogram results and cystoscopy.  These notes generated with voice recognition software. I apologize for typographical errors.  Herbert Moors, [facility] [address], [place], [place] [address] [phone]

## 2015-01-31 LAB — BASIC METABOLIC PANEL
BUN/Creatinine Ratio: 14 (ref 11–26)
BUN: 17 mg/dL (ref 8–27)
CALCIUM: 9.4 mg/dL (ref 8.7–10.3)
CO2: 25 mmol/L (ref 18–29)
Chloride: 99 mmol/L (ref 97–106)
Creatinine, Ser: 1.24 mg/dL — ABNORMAL HIGH (ref 0.57–1.00)
GFR calc Af Amer: 47 mL/min/{1.73_m2} — ABNORMAL LOW (ref >59)
GFR calc non Af Amer: 41 mL/min/{1.73_m2} — ABNORMAL LOW (ref >59)
Glucose: 86 mg/dL (ref 65–99)
Potassium: 4.6 mmol/L (ref 3.5–5.2)
Sodium: 139 mmol/L (ref 136–144)

## 2015-02-01 LAB — CULTURE, URINE COMPREHENSIVE

## 2015-02-02 ENCOUNTER — Telehealth: Payer: Self-pay

## 2015-02-02 DIAGNOSIS — N390 Urinary tract infection, site not specified: Secondary | ICD-10-CM

## 2015-02-02 MED ORDER — NITROFURANTOIN MONOHYD MACRO 100 MG PO CAPS: 100.0000 mg | ORAL_CAPSULE | Freq: Two times a day (BID) | ORAL | Status: AC

## 2015-02-02 NOTE — Telephone Encounter (Signed)
Spoke with pt in reference to UTI s/s. Pt stated that she is still having dysuria, frequency, urgency, and some lower back pain. Made aware of abx. Pt voiced understanding.

## 2015-02-02 NOTE — Telephone Encounter (Signed)
-----   Message from Roda Shutters, [place] sent at 02/01/2015  4:24 PM EST ----- Please notify patient that her urine culture did grow out a small amount of bacteria but is not generally considered clinically significant for infection. She had is having significant urinary symptoms we can send in a prescription for Macrobid 100 mg twice a day 7 days. Thanks

## 2015-02-10 ENCOUNTER — Inpatient Hospital Stay
Admission: EM | Admit: 2015-02-10 | Discharge: 2015-02-12 | DRG: 194 | Disposition: A | Discharge status: Home / Self Care | Payer: Medicare Other | Attending: Internal Medicine | Admitting: Internal Medicine

## 2015-02-10 ENCOUNTER — Inpatient Hospital Stay
Admit: 2015-02-10 | Discharge: 2015-02-10 | Disposition: A | Discharge status: Home / Self Care | Payer: Medicare Other | Attending: Internal Medicine | Admitting: Internal Medicine

## 2015-02-10 ENCOUNTER — Ambulatory Visit: Admission: RE | Admit: 2015-02-10 | Payer: Medicare Other | Source: Ambulatory Visit

## 2015-02-10 ENCOUNTER — Encounter: Payer: Self-pay | Admitting: Medical Oncology

## 2015-02-10 ENCOUNTER — Emergency Department: Payer: Medicare Other

## 2015-02-10 DIAGNOSIS — I5030 Unspecified diastolic (congestive) heart failure: Secondary | ICD-10-CM | POA: Diagnosis present

## 2015-02-10 DIAGNOSIS — Z79899 Other long term (current) drug therapy: Secondary | ICD-10-CM

## 2015-02-10 DIAGNOSIS — Z8572 Personal history of non-Hodgkin lymphomas: Secondary | ICD-10-CM | POA: Diagnosis not present

## 2015-02-10 DIAGNOSIS — R0902 Hypoxemia: Secondary | ICD-10-CM | POA: Diagnosis present

## 2015-02-10 DIAGNOSIS — F329 Major depressive disorder, single episode, unspecified: Secondary | ICD-10-CM | POA: Diagnosis present

## 2015-02-10 DIAGNOSIS — I480 Paroxysmal atrial fibrillation: Secondary | ICD-10-CM | POA: Diagnosis present

## 2015-02-10 DIAGNOSIS — R0602 Shortness of breath: Secondary | ICD-10-CM | POA: Diagnosis not present

## 2015-02-10 DIAGNOSIS — I2510 Atherosclerotic heart disease of native coronary artery without angina pectoris: Secondary | ICD-10-CM | POA: Diagnosis present

## 2015-02-10 DIAGNOSIS — Z955 Presence of coronary angioplasty implant and graft: Secondary | ICD-10-CM

## 2015-02-10 DIAGNOSIS — E039 Hypothyroidism, unspecified: Secondary | ICD-10-CM | POA: Diagnosis present

## 2015-02-10 DIAGNOSIS — J189 Pneumonia, unspecified organism: Principal | ICD-10-CM | POA: Diagnosis present

## 2015-02-10 DIAGNOSIS — I110 Hypertensive heart disease with heart failure: Secondary | ICD-10-CM | POA: Diagnosis present

## 2015-02-10 DIAGNOSIS — F419 Anxiety disorder, unspecified: Secondary | ICD-10-CM | POA: Diagnosis present

## 2015-02-10 DIAGNOSIS — Z7982 Long term (current) use of aspirin: Secondary | ICD-10-CM | POA: Diagnosis not present

## 2015-02-10 DIAGNOSIS — Z882 Allergy status to sulfonamides status: Secondary | ICD-10-CM

## 2015-02-10 DIAGNOSIS — E785 Hyperlipidemia, unspecified: Secondary | ICD-10-CM | POA: Diagnosis present

## 2015-02-10 HISTORY — DX: Gastro-esophageal reflux disease without esophagitis: K21.9

## 2015-02-10 HISTORY — DX: Heart failure, unspecified: I50.9

## 2015-02-10 LAB — URINALYSIS COMPLETE WITH MICROSCOPIC (ARMC ONLY)
BACTERIA UA: NONE SEEN
Bilirubin Urine: NEGATIVE
Glucose, UA: NEGATIVE mg/dL
KETONES UR: NEGATIVE mg/dL
Nitrite: NEGATIVE
PH: 5 (ref 5.0–8.0)
PROTEIN: NEGATIVE mg/dL
SPECIFIC GRAVITY, URINE: 1.015 (ref 1.005–1.030)

## 2015-02-10 LAB — CBC
HCT: 38 % (ref 35.0–47.0)
HEMOGLOBIN: 12.3 g/dL (ref 12.0–16.0)
MCH: 30.3 pg (ref 26.0–34.0)
MCHC: 32.4 g/dL (ref 32.0–36.0)
MCV: 93.5 fL (ref 80.0–100.0)
Platelets: 335 10*3/uL (ref 150–440)
RBC: 4.06 MIL/uL (ref 3.80–5.20)
RDW: 14.2 % (ref 11.5–14.5)
WBC: 13.7 10*3/uL — AB (ref 3.6–11.0)

## 2015-02-10 LAB — LACTIC ACID, PLASMA
LACTIC ACID, VENOUS: 1 mmol/L (ref 0.5–2.0)
Lactic Acid, Venous: 1.8 mmol/L (ref 0.5–2.0)

## 2015-02-10 LAB — BASIC METABOLIC PANEL
ANION GAP: 7 (ref 5–15)
BUN: 19 mg/dL (ref 6–20)
CHLORIDE: 107 mmol/L (ref 101–111)
CO2: 24 mmol/L (ref 22–32)
Calcium: 8.8 mg/dL — ABNORMAL LOW (ref 8.9–10.3)
Creatinine, Ser: 1.1 mg/dL — ABNORMAL HIGH (ref 0.44–1.00)
GFR calc Af Amer: 53 mL/min — ABNORMAL LOW (ref >60)
GFR calc non Af Amer: 46 mL/min — ABNORMAL LOW (ref >60)
Glucose, Bld: 92 mg/dL (ref 65–99)
POTASSIUM: 3.7 mmol/L (ref 3.5–5.1)
SODIUM: 138 mmol/L (ref 135–145)

## 2015-02-10 LAB — TROPONIN I: Troponin I: <0.03 ng/mL (ref <0.031)

## 2015-02-10 MED ORDER — LEVOTHYROXINE SODIUM 25 MCG PO TABS
25.0000 ug | ORAL_TABLET | Freq: Every day | ORAL | Status: DC
  Administered 2015-02-11 – 2015-02-12 (×2): 25 ug via ORAL
  Filled 2015-02-10 (×3): qty 1

## 2015-02-10 MED ORDER — ENOXAPARIN SODIUM 40 MG/0.4ML ~~LOC~~ SOLN
40.0000 mg | SUBCUTANEOUS | Status: DC
  Administered 2015-02-10 – 2015-02-11 (×2): 40 mg via SUBCUTANEOUS
  Filled 2015-02-10 (×2): qty 0.4

## 2015-02-10 MED ORDER — HYDROCODONE-ACETAMINOPHEN 5-325 MG PO TABS: 1.0000 | ORAL_TABLET | ORAL | Status: DC | PRN

## 2015-02-10 MED ORDER — ACETAMINOPHEN 325 MG PO TABS: 650.0000 mg | ORAL_TABLET | Freq: Four times a day (QID) | ORAL | Status: DC | PRN

## 2015-02-10 MED ORDER — ONDANSETRON HCL 4 MG/2ML IJ SOLN: 4.0000 mg | Freq: Four times a day (QID) | INTRAMUSCULAR | Status: DC | PRN

## 2015-02-10 MED ORDER — ROPINIROLE HCL 0.25 MG PO TABS
0.5000 mg | ORAL_TABLET | Freq: Every day | ORAL | Status: DC
  Administered 2015-02-10 – 2015-02-11 (×2): 0.5 mg via ORAL
  Filled 2015-02-10 (×2): qty 2

## 2015-02-10 MED ORDER — SODIUM CHLORIDE 0.9 % IV BOLUS (SEPSIS)
1000.0000 mL | Freq: Once | INTRAVENOUS | Status: AC
  Administered 2015-02-10: 1000 mL via INTRAVENOUS

## 2015-02-10 MED ORDER — LEVOFLOXACIN IN D5W 250 MG/50ML IV SOLN
250.0000 mg | INTRAVENOUS | Status: DC
  Filled 2015-02-10: qty 50

## 2015-02-10 MED ORDER — ASPIRIN EC 81 MG PO TBEC
81.0000 mg | DELAYED_RELEASE_TABLET | Freq: Every day | ORAL | Status: DC
  Administered 2015-02-10 – 2015-02-12 (×3): 81 mg via ORAL
  Filled 2015-02-10 (×3): qty 1

## 2015-02-10 MED ORDER — ONDANSETRON HCL 4 MG PO TABS
4.0000 mg | ORAL_TABLET | Freq: Four times a day (QID) | ORAL | Status: DC | PRN
Start: 2015-02-10 — End: 2015-02-12

## 2015-02-10 MED ORDER — GABAPENTIN 300 MG PO CAPS
300.0000 mg | ORAL_CAPSULE | Freq: Two times a day (BID) | ORAL | Status: DC
  Administered 2015-02-10 – 2015-02-12 (×5): 300 mg via ORAL
  Filled 2015-02-10 (×6): qty 1

## 2015-02-10 MED ORDER — SODIUM CHLORIDE 0.9 % IJ SOLN
3.0000 mL | Freq: Two times a day (BID) | INTRAMUSCULAR | Status: DC
  Administered 2015-02-10 – 2015-02-11 (×4): 3 mL via INTRAVENOUS

## 2015-02-10 MED ORDER — FLUTICASONE PROPIONATE 50 MCG/ACT NA SUSP
2.0000 | Freq: Every day | NASAL | Status: DC
  Administered 2015-02-10 – 2015-02-12 (×3): 2 via NASAL
  Filled 2015-02-10: qty 16

## 2015-02-10 MED ORDER — ALLOPURINOL 100 MG PO TABS
100.0000 mg | ORAL_TABLET | Freq: Every day | ORAL | Status: DC
  Administered 2015-02-10 – 2015-02-12 (×3): 100 mg via ORAL
  Filled 2015-02-10 (×3): qty 1

## 2015-02-10 MED ORDER — OMEGA-3-ACID ETHYL ESTERS 1 G PO CAPS
1.0000 g | ORAL_CAPSULE | Freq: Every day | ORAL | Status: DC
  Administered 2015-02-10 – 2015-02-12 (×3): 1 g via ORAL
  Filled 2015-02-10 (×3): qty 1

## 2015-02-10 MED ORDER — ACETAMINOPHEN 650 MG RE SUPP: 650.0000 mg | Freq: Four times a day (QID) | RECTAL | Status: DC | PRN

## 2015-02-10 MED ORDER — LEVOFLOXACIN IN D5W 500 MG/100ML IV SOLN
500.0000 mg | Freq: Once | INTRAVENOUS | Status: AC
  Administered 2015-02-11: 08:00:00 500 mg via INTRAVENOUS
  Filled 2015-02-10: qty 100

## 2015-02-10 MED ORDER — PANTOPRAZOLE SODIUM 40 MG PO TBEC
40.0000 mg | DELAYED_RELEASE_TABLET | Freq: Every day | ORAL | Status: DC
  Administered 2015-02-10 – 2015-02-12 (×3): 40 mg via ORAL
  Filled 2015-02-10 (×3): qty 1

## 2015-02-10 MED ORDER — DEXTROSE 5 % IV SOLN
500.0000 mg | Freq: Once | INTRAVENOUS | Status: AC
  Administered 2015-02-10: 500 mg via INTRAVENOUS
  Filled 2015-02-10: qty 500

## 2015-02-10 MED ORDER — DEXTROSE 5 % IV SOLN
1.0000 g | Freq: Once | INTRAVENOUS | Status: AC
  Administered 2015-02-10: 1 g via INTRAVENOUS
  Filled 2015-02-10: qty 10

## 2015-02-10 NOTE — Progress Notes (Signed)
*  PRELIMINARY RESULTS* Echocardiogram 2D Echocardiogram has been performed.  [name] 02/10/2015, 3:30 PM

## 2015-02-10 NOTE — ED Provider Notes (Signed)
[facility] Emergency Department Provider Note  ____________________________________________  Time seen: Approximately 9:40 AM  I have reviewed the triage vital signs and the nursing notes.   HISTORY  Chief Complaint Shortness of Breath    HPI [name] is a 81 y.o. female with a history of CAD status post stent, A. Fib presenting with several days of increasingly worsening shortness of breath associated with a nonproductive cough, rhinorrhea and fever/chills. Patient states that she has pain "all over" in her chest especially with deep breaths. She has had no nausea or vomiting but has had decreased by mouth intake due to loss of appetite. She denies any sore throat, lightheadedness or syncope, palpitations, or lower extremity swelling.   Past Medical History  Diagnosis Date  . Coronary artery disease   . Afib ([place])   . Hyperlipemia   . Thyroid disease   . Anxiety   . Cancer [facility])     Lymphoma     Patient Active Problem List   Diagnosis Date Noted  . Disease of thyroid gland 07/29/2014  . HLD (hyperlipidemia) 07/29/2014  . Clinical depression 07/29/2014  . Arteriosclerosis of coronary artery 07/29/2014  . A-fib ([place]) 07/29/2014  . Anxiety 07/29/2014  . Diverticulitis 07/23/2014  . Edema leg 07/08/2014  . Acute bronchitis due to infection 01/24/2014  . Lumbar canal stenosis 12/13/2013  . Neuritis or radiculitis due to rupture of lumbar intervertebral disc 12/13/2013    Past Surgical History  Procedure Laterality Date  . Abdominal hysterectomy    . Coronary stent placement      Current Outpatient Rx  Name  Route  Sig  Dispense  Refill  . allopurinol (ZYLOPRIM) 100 MG tablet   Oral   Take 1 tablet by mouth.         Marland Kitchen aspirin EC 81 MG tablet   Oral   Take 81 mg by mouth daily.         . fluticasone (FLONASE) 50 MCG/ACT nasal spray   Each Nare   Place 2 sprays into both nostrils daily.       3   . furosemide (LASIX) 40 MG  tablet   Oral   Take 1 tablet (40 mg total) by mouth daily.   30 tablet   1   . gabapentin (NEURONTIN) 300 MG capsule   Oral   Take 300 mg by mouth 2 (two) times daily.      2   . HYDROcodone-acetaminophen (NORCO/VICODIN) 5-325 MG per tablet   Oral   Take 1-2 tablets by mouth every 4 (four) hours as needed for moderate pain.   30 tablet   0   . levothyroxine (SYNTHROID, LEVOTHROID) 25 MCG tablet   Oral   Take 1 tablet (25 mcg total) by mouth daily before breakfast. Take 30-60 minutes before breakfast,   30 tablet   5   . nitroGLYCERIN (NITROSTAT) 0.4 MG SL tablet   Sublingual   Place 0.4 mg under the tongue every 5 (five) minutes x 3 doses as needed. For chest pain         . Omega-3 Fatty Acids (FISH OIL) 1000 MG CAPS   Oral   Take 1,000 mg by mouth daily.         . pantoprazole (PROTONIX) 40 MG tablet   Oral   Take 40 mg by mouth daily.         . potassium chloride (K-DUR,KLOR-CON) 10 MEQ tablet   Oral   Take 10  mEq by mouth 2 (two) times daily.         Marland Kitchen rOPINIRole (REQUIP) 0.5 MG tablet   Oral   Take 0.5 mg by mouth at bedtime.         . cephALEXin (KEFLEX) 500 MG capsule   Oral   Take 1 capsule (500 mg total) by mouth 3 (three) times daily. Patient not taking: Reported on 02/10/2015   21 capsule   0   . ibuprofen (ADVIL,MOTRIN) 600 MG tablet   Oral   Take 1 tablet (600 mg total) by mouth every 6 (six) hours as needed for moderate pain. Patient not taking: Reported on 02/10/2015   20 tablet   0     Allergies Sulfa antibiotics  Family History  Problem Relation Age of Onset  . Heart Problems Mother   . Heart Problems Father   . Diabetes Brother     Social History Social History  Substance Use Topics  . Smoking status: Never Smoker   . Smokeless tobacco: Never Used  . Alcohol Use: No    Review of Systems Constitutional: Positive fever/chills negative lightheadedness or syncope. Eyes: No visual changes. ENT: No sore throat.  Positive rhinorrhea. Negative ear pain. Cardiovascular: Positive diffuse chest pain, no palpitations. Respiratory: Positive shortness of breath.  Positive nonproductive cough. Gastrointestinal: No abdominal pain.  No nausea, no vomiting.  No diarrhea.  No constipation. Genitourinary: Negative for dysuria. Musculoskeletal: Negative for back pain. Skin: Negative for rash. Neurological: Negative for headaches, focal weakness or numbness.  10-point ROS otherwise negative.  ____________________________________________   PHYSICAL EXAM:  VITAL SIGNS: ED Triage Vitals  Enc Vitals Group     BP 02/10/15 0935 116/63 mmHg     Pulse Rate 02/10/15 0935 86     Resp 02/10/15 0935 20     Temp 02/10/15 0935 99.2 F (37.3 C)     Temp Source 02/10/15 0935 Oral     SpO2 02/10/15 0935 93 %     Weight 02/10/15 0935 145 lb (65.772 kg)     Height 02/10/15 0935 5\' 1"  (1.549 m)     Head Cir --      Peak Flow --      Pain Score --      Pain Loc --      Pain Edu? --      Excl. in [place]? --     Constitutional: Patient is alert and oriented and able to answer questions appropriately. She appears uncomfortable but is nontoxic in the stretcher.  Eyes: Conjunctivae are normal.  EOMI. scleral icterus. No discharge. Head: Atraumatic. Nose: No congestion/rhinnorhea. Mouth/Throat: Mucous membranes are dry. No posterior pharyngeal erythema, palatal asymmetry, tonsillar swelling or exudate. Neck: No stridor.  Supple.  No JVD. No meningismus. Cardiovascular: Fast rate in the 110's, regular rhythm. No murmurs, rubs or gallops.  Respiratory: Patient is tachypnea with accessory muscle use but no retractions. She is able to speak in full sentences. She does not have any wheezing or rhonchi but does have rales in the right lower lung base. Good air exchange with O2 sat at 92/93% on room air. Gastrointestinal: Soft and nontender. No distention. No peritoneal signs. Musculoskeletal: No LE edema. No palpable cords or  tenderness to palpation in the calves. Neurologic:  Normal speech and language. No gross focal neurologic deficits are appreciated.  Skin:  Skin is warm, dry and intact. No rash noted. Psychiatric: Mood and affect are normal. Speech and behavior are normal.  Normal judgement.  ____________________________________________  LABS (all labs ordered are listed, but only abnormal results are displayed)  Labs Reviewed  CBC - Abnormal; Notable for the following:    WBC 13.7 (*)    All other components within normal limits  BASIC METABOLIC PANEL - Abnormal; Notable for the following:    Creatinine, Ser 1.10 (*)    Calcium 8.8 (*)    GFR calc non Af Amer 46 (*)    GFR calc Af Amer 53 (*)    All other components within normal limits  BLOOD GAS, VENOUS - Abnormal; Notable for the following:    pCO2, Ven 40 (*)    All other components within normal limits  CULTURE, BLOOD (ROUTINE X 2)  CULTURE, BLOOD (ROUTINE X 2)  URINE CULTURE  TROPONIN I  LACTIC ACID, PLASMA  LACTIC ACID, PLASMA  URINALYSIS COMPLETEWITH MICROSCOPIC ([facility] ONLY)   ____________________________________________  EKG  ED ECG REPORT I, Eula Listen, the attending physician, personally viewed and interpreted this ECG.   Date: 02/10/2015  EKG Time: 933  Rate: 90  Rhythm: normal sinus rhythm  Axis: Normal  Intervals:none  ST&T Change: Nonspecific T-wave inversion in V1. No ST elevation. No ischemic changes.  ____________________________________________  RADIOLOGY  Dg Chest 2 View  02/10/2015  CLINICAL DATA:  Progressive shortness of breath and cough for 3 days EXAM: CHEST  2 VIEW COMPARISON:  July 27, 2014 ; June 18, 2014 FINDINGS: Generalized interstitial prominence remains without change. There is no airspace consolidation. Heart size is upper normal with pulmonary vascularity within normal limits. No adenopathy. There is degenerative change in thoracic spine. IMPRESSION: Diffuse interstitial prominence  remains. This appearance is consistent with a degree of chronic congestive heart failure. There may also be a degree of underlying inflammatory/ fibrotic type change. Both entities may exist concurrently. There is no new opacity compared to prior studies. No airspace consolidation. Heart upper normal in size. Electronically Signed   By: Lowella Grip III M.D.   On: 02/10/2015 10:16    ____________________________________________   PROCEDURES  Procedure(s) performed: None  Critical Care performed: No ____________________________________________   INITIAL IMPRESSION / ASSESSMENT AND PLAN / ED COURSE  Pertinent labs & imaging results that were available during my care of the patient were reviewed by me and considered in my medical decision making (see chart for details).  81 y.o. female presenting with several days of progressively worsening shortness of breath in the setting of a cough and fever. Clinically she does have rales in the lower lung base on the right and I'm concerned she may have a pneumonia. She is tachypnea, tachycardia, and mildly hypoxic. I will get a chest x-ray, basic labs, and anticipated admission to the hospital for pneumonia with hypoxia. I will also evaluate for other possible sources of her symptoms including a primary cardiac etiology, although her EKG is reassuring and does not have ischemic changes.  ----------------------------------------- 10:44 AM on 02/10/2015 -----------------------------------------  The patient does have an elevated white blood cell count, and although her x-ray does not show any new opacity she does have some tonic lung changes that may result in a radiographic lag. Clinically she has a picture that is concerning for pneumonia with associated hypoxia. I have given her impaired antibiotic's for community-acquired pneumonia, and will admit her to the hospital for further treatment.  ____________________________________________  FINAL  CLINICAL IMPRESSION(S) / ED DIAGNOSES  Final diagnoses:  Community acquired pneumonia  Hypoxia  Shortness of breath      NEW MEDICATIONS STARTED DURING THIS  VISIT:  New Prescriptions   No medications on file     Eula Listen, MD 02/10/15 1044

## 2015-02-10 NOTE — Progress Notes (Signed)
ANTIBIOTIC CONSULT NOTE - INITIAL  Pharmacy Consult for levofloxacin Indication: pneumonia  Allergies  Allergen Reactions  . Sulfa Antibiotics Hives and Rash    Patient Measurements: Height: 5\' 1"  (154.9 cm) Weight: 145 lb (65.772 kg) IBW/kg (Calculated) : 47.8   Vital Signs: Temp: 98.5 F (36.9 C) (12/16 1342) Temp Source: Oral (12/16 1342) BP: 96/55 mmHg (12/16 1342) Pulse Rate: 81 (12/16 1342) Intake/Output from previous day:   Intake/Output from this shift:    Labs:  Recent Labs  02/10/15 0944  WBC 13.7*  HGB 12.3  PLT 335  CREATININE 1.10*   Estimated Creatinine Clearance: 35.4 mL/min (by C-G formula based on Cr of 1.1). No results for input(s): VANCOTROUGH, VANCOPEAK, VANCORANDOM, GENTTROUGH, GENTPEAK, GENTRANDOM, TOBRATROUGH, TOBRAPEAK, TOBRARND, AMIKACINPEAK, AMIKACINTROU, AMIKACIN in the last 72 hours.   Microbiology: Recent Results (from the past 720 hour(s))  Microscopic Examination     Status: Abnormal   Collection Time: 01/30/15  9:11 AM  Result Value Ref Range Status   WBC, UA >30 (H) 0 -  5 /hpf Final   RBC, UA None seen 0 -  2 /hpf Final   Epithelial Cells (non renal) 0-10 0 - 10 /hpf Final   Renal Epithel, UA 0-10 (A) None seen /hpf Final   Bacteria, UA Many (A) None seen/Few Final  CULTURE, URINE COMPREHENSIVE     Status: Abnormal   Collection Time: 01/30/15  9:33 AM  Result Value Ref Range Status   Urine Culture, Comprehensive Final report (A)  Final   Result 1 Comment (A)  Final    Comment: Enterobacter cloacae complex 25,000-50,000 colony forming units per mL    ANTIMICROBIAL SUSCEPTIBILITY Comment  Final    Comment:       ** S = Susceptible; I = Intermediate; R = Resistant **                    P = Positive; N = Negative             MICS are expressed in micrograms per mL    Antibiotic                 RSLT#1    RSLT#2    RSLT#3    RSLT#4 Amoxicillin/Clavulanic Acid    R Cefazolin                      R Cefepime                        S Ceftriaxone                    S Cefuroxime                     R Cephalothin                    R Ciprofloxacin                  S Ertapenem                      S Gentamicin                     S Imipenem                       S Levofloxacin  S Nitrofurantoin                 S Piperacillin                   I Tetracycline                   S Tobramycin                     S Trimethoprim/Sulfa             S   Blood culture (routine x 2)     Status: None (Preliminary result)   Collection Time: 02/10/15  9:43 AM  Result Value Ref Range Status   Specimen Description BLOOD RIGHT ARM  Final   Special Requests BOTTLES DRAWN AEROBIC AND ANAEROBIC [place]  Final   Culture NO GROWTH < 12 HOURS  Final   Report Status PENDING  Incomplete  Blood culture (routine x 2)     Status: None (Preliminary result)   Collection Time: 02/10/15  9:45 AM  Result Value Ref Range Status   Specimen Description BLOOD LEFT ARM  Final   Special Requests BOTTLES DRAWN AEROBIC AND ANAEROBIC [place]  Final   Culture NO GROWTH < 12 HOURS  Final   Report Status PENDING  Incomplete    Medical History: Past Medical History  Diagnosis Date  . Coronary artery disease     Status post stent placement in LAD  . Afib ([place])   . Hyperlipemia   . Thyroid disease   . Anxiety   . Cancer ([place])     Lymphoma   . Anxiety   . Congestive heart failure (CHF) ([place])   . GERD (gastroesophageal reflux disease)     Medications:  Scheduled:  . allopurinol  100 mg Oral Daily  . aspirin EC  81 mg Oral Daily  . enoxaparin (LOVENOX) injection  40 mg Subcutaneous Q24H  . fluticasone  2 spray Each Nare Daily  . gabapentin  300 mg Oral BID  . [START ON 02/12/2015] levofloxacin (LEVAQUIN) IV  250 mg Intravenous Q24H  . [START ON 02/11/2015] levofloxacin (LEVAQUIN) IV  500 mg Intravenous Once  . [START ON 02/11/2015] levothyroxine  25 mcg Oral QAC breakfast  . omega-3 acid ethyl esters  1 g Oral Daily  .  pantoprazole  40 mg Oral Daily  . rOPINIRole  0.5 mg Oral QHS  . sodium chloride  3 mL Intravenous Q12H   Assessment: Patient is an 81 yo female admitted for treatmetn of CAP.  Patient has received Ceftriaxone 1 gm IV once and Azithromycin 500 mg IV once in ED.   MD would like to continue empiric treatment with IV levofloxacin.  Pharmacy consulted to dose.   Est CrCl~35.4 mL/min  Goal of Therapy:  Resolution of infection  Plan:  As patient has received both IV Azithromycin and Ceftriaxone and due to increased risk of Qtc prolongation if Azithromycin and Levofloxacin administered within 18 hours of each other, will transition patient to Levofloxacin 500 mg IV once to start on 12/17 at 0800.  Will then continue Levofloxacin 250 mg IV q24h based on current renal function.  Follow up culture results   Pharmacy will continue to follow and adjust as warranted.   Nathyn Luiz G 02/10/2015,2:12 PM
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
## 2015-02-10 NOTE — ED Notes (Signed)
Pt from home via ems with reports of difficulty breathing and cough for several days, pt reports that it worsened this am and she has been having chills. Denies pain at this time but reports that she has been having chest pain off and on with deep breathing.

## 2015-02-11 ENCOUNTER — Inpatient Hospital Stay: Payer: Medicare Other

## 2015-02-11 ENCOUNTER — Other Ambulatory Visit: Payer: Self-pay | Admitting: Surgery

## 2015-02-11 LAB — CBC
HCT: 34 % — ABNORMAL LOW (ref 35.0–47.0)
Hemoglobin: 11.2 g/dL — ABNORMAL LOW (ref 12.0–16.0)
MCH: 30.6 pg (ref 26.0–34.0)
MCHC: 32.8 g/dL (ref 32.0–36.0)
MCV: 93.4 fL (ref 80.0–100.0)
PLATELETS: 294 10*3/uL (ref 150–440)
RBC: 3.64 MIL/uL — AB (ref 3.80–5.20)
RDW: 14.2 % (ref 11.5–14.5)
WBC: 5.8 10*3/uL (ref 3.6–11.0)

## 2015-02-11 LAB — BASIC METABOLIC PANEL
Anion gap: 5 (ref 5–15)
BUN: 18 mg/dL (ref 6–20)
CALCIUM: 8.7 mg/dL — AB (ref 8.9–10.3)
CO2: 25 mmol/L (ref 22–32)
CREATININE: 1.23 mg/dL — AB (ref 0.44–1.00)
Chloride: 111 mmol/L (ref 101–111)
GFR calc non Af Amer: 40 mL/min — ABNORMAL LOW (ref >60)
GFR, EST AFRICAN AMERICAN: 47 mL/min — AB (ref >60)
Glucose, Bld: 91 mg/dL (ref 65–99)
Potassium: 3.5 mmol/L (ref 3.5–5.1)
SODIUM: 141 mmol/L (ref 135–145)

## 2015-02-11 MED ORDER — DIPHENHYDRAMINE HCL 25 MG PO CAPS: 25.0000 mg | ORAL_CAPSULE | Freq: Every evening | ORAL | Status: DC | PRN

## 2015-02-11 MED ORDER — ZOLPIDEM TARTRATE 5 MG PO TABS
5.0000 mg | ORAL_TABLET | Freq: Every evening | ORAL | Status: DC | PRN
  Administered 2015-02-11: 5 mg via ORAL
  Filled 2015-02-11: qty 1

## 2015-02-11 NOTE — Plan of Care (Signed)
Problem: Education: Goal: Knowledge of [facility] Education information/materials will improve Outcome: Completed/Met Date Met:  02/11/15 Education completed with pt and dgt  Problem: Safety: Goal: Ability to remain free from injury will improve Outcome: Progressing Pt free from injury/fall this shift. Education done on "call,don't fall". Pt educated to have standby assistance when up due to lower BP parameters; pt denies symptoms.Up in room independently and BR and tolerated well.  Problem: Respiratory: Goal: Respiratory status will improve Outcome: Progressing 02 discontinued with pulse ox 98% on RA. Afebrile. Reports slight right chest discomfort;decline prn pain meds. Rare nonproductive cough.Levaquin IV given. Encouraged effective cough/deep breathing.   Problem: Activity: Goal: Ability to tolerate increased activity will improve Outcome: Completed/Met Date Met:  02/11/15 Up ad lib on room air and tolerated well.

## 2015-02-11 NOTE — Progress Notes (Signed)
Initial Nutrition Assessment       INTERVENTION:  Meals and snacks: Cater to pt preferences Medical Nutrition Supplement Therapy: Will add mighyshake BID for added nutrition   NUTRITION DIAGNOSIS:   Inadequate oral intake related to acute illness as evidenced by per patient/family report.    GOAL:   Patient will meet greater than or equal to 90% of their needs    MONITOR:    (Energy intake, )  REASON FOR ASSESSMENT:   Malnutrition Screening Tool    ASSESSMENT:      Pt admitted with pneumonia, shortness of breath  Past Medical History  Diagnosis Date  . Coronary artery disease     Status post stent placement in LAD  . Afib ([place])   . Hyperlipemia   . Thyroid disease   . Anxiety   . Cancer ([place])     Lymphoma   . Anxiety   . Congestive heart failure (CHF) ([place])   . GERD (gastroesophageal reflux disease)     Current Nutrition: ate 100% of supper last night, just started eating breakfast this am during visit  Food/Nutrition-Related History: pt reports decrease intake for the past week prior to admission   Scheduled Medications:  . allopurinol  100 mg Oral Daily  . aspirin EC  81 mg Oral Daily  . enoxaparin (LOVENOX) injection  40 mg Subcutaneous Q24H  . fluticasone  2 spray Each Nare Daily  . gabapentin  300 mg Oral BID  . [START ON 02/12/2015] levofloxacin (LEVAQUIN) IV  250 mg Intravenous Q24H  . levofloxacin (LEVAQUIN) IV  500 mg Intravenous Once  . levothyroxine  25 mcg Oral QAC breakfast  . omega-3 acid ethyl esters  1 g Oral Daily  . pantoprazole  40 mg Oral Daily  . rOPINIRole  0.5 mg Oral QHS  . sodium chloride  3 mL Intravenous Q12H     Electrolyte/Renal Profile and Glucose Profile:   Recent Labs Lab 02/10/15 0944 02/11/15 0459  NA 138 141  K 3.7 3.5  CL 107 111  CO2 24 25  BUN 19 18  CREATININE 1.10* 1.23*  CALCIUM 8.8* 8.7*  GLUCOSE 92 91   Gastrointestinal Profile: Last BM:12/16   Nutrition-Focused Physical Exam  Findings:  Unable to complete Nutrition-Focused physical exam at this time.  Pt wanting to eat breakfast this am   Weight Change: per wt encounters, wt loss of 14% in the last 6 months    Diet Order:  Diet 2 gram sodium Room service appropriate?: Yes; Fluid consistency:: Thin  Skin:   reviewed      Height:   Ht Readings from Last 1 Encounters:  02/10/15 5\' 1"  (1.549 m)    Weight:   Wt Readings from Last 1 Encounters:  02/10/15 145 lb (65.772 kg)    Ideal Body Weight:     BMI:  Body mass index is 27.41 kg/(m^2).  Estimated Nutritional Needs:   Kcal:  BEE 887 kcals (IF 1.0-1.3, AF 1.3) OF:[id number] kcals/d Using IBW of 48kg  Protein:  (1.0-1.2 g/kg) 48-58 g/d  Fluid:  (25-75ml/kg) 1200-1462ml/d  EDUCATION NEEDS:   No education needs identified at this time  MODERATE Care Level  Beuna Bolding B. Zenia Resides, [place], [place] (pager)

## 2015-02-11 NOTE — Progress Notes (Signed)
[facility] [place] NAME: [name]    MR#:  [id number]  DATE OF BIRTH:  1933-01-18  SUBJECTIVE:  CHIEF COMPLAINT:   Chief Complaint  Patient presents with  . Shortness of Breath   -Patient admitted yesterday with difficulty breathing and also chest pain on deep breathing. She was requiring 2 L of O2 last night, not on any home oxygen. -Sats seem very well. Continues to have some dyspnea on minimal exertion. Cough is improved.  REVIEW OF SYSTEMS:  Review of Systems  Constitutional: Negative for fever and chills.  HENT: Negative for ear discharge, ear pain and nosebleeds.   Eyes: Negative for blurred vision.  Respiratory: Positive for shortness of breath. Negative for cough and wheezing.   Cardiovascular: Positive for chest pain. Negative for palpitations.       Chest pain on deep breathing  Gastrointestinal: Negative for nausea, vomiting, abdominal pain, diarrhea and constipation.  Genitourinary: Negative for dysuria, urgency and frequency.  Neurological: Negative for dizziness, sensory change, speech change, focal weakness, seizures, weakness and headaches.  Psychiatric/Behavioral: Negative for depression.    DRUG ALLERGIES:   Allergies  Allergen Reactions  . Sulfa Antibiotics Hives and Rash    VITALS:  Blood pressure 113/56, pulse 79, temperature 98.3 F (36.8 C), temperature source Oral, resp. rate 19, height 5\' 1"  (1.549 m), weight 65.772 kg (145 lb), SpO2 98 %.  PHYSICAL EXAMINATION:  Physical Exam  GENERAL: 81 y.o.-year-old patient lying in the bed with no acute distress.  EYES: Pupils equal, round, reactive to light and accommodation. No scleral icterus. Extraocular muscles intact.  HEENT: Head atraumatic, normocephalic. Oropharynx and nasopharynx clear.  NECK: Supple, no jugular venous distention. No thyroid enlargement, no tenderness.  LUNGS: Normal breath sounds bilaterally, no wheezing,rhonchi.  Bibasilar crackles heard. No use of accessory muscles of respiration.  CARDIOVASCULAR: S1, S2 normal. No murmurs, rubs, or gallops.  ABDOMEN: Soft, nontender, nondistended. Bowel sounds present. No organomegaly or mass.  EXTREMITIES: No pedal edema, cyanosis, or clubbing.  NEUROLOGIC: Cranial nerves II through XII are intact. Muscle strength 5/5 in all extremities. Sensation intact. Gait not checked.  PSYCHIATRIC: The patient is alert and oriented x 3.  SKIN: No obvious rash, lesion, or ulcer.    LABORATORY PANEL:   CBC  Recent Labs Lab 02/11/15 0459  WBC 5.8  HGB 11.2*  HCT 34.0*  PLT 294   ------------------------------------------------------------------------------------------------------------------  Chemistries   Recent Labs Lab 02/11/15 0459  NA 141  K 3.5  CL 111  CO2 25  GLUCOSE 91  BUN 18  CREATININE 1.23*  CALCIUM 8.7*   ------------------------------------------------------------------------------------------------------------------  Cardiac Enzymes  Recent Labs Lab 02/10/15 0944  TROPONINI <0.03   ------------------------------------------------------------------------------------------------------------------  RADIOLOGY:  Dg Chest 2 View  02/10/2015  CLINICAL DATA:  Progressive shortness of breath and cough for 3 days EXAM: CHEST  2 VIEW COMPARISON:  July 27, 2014 ; June 18, 2014 FINDINGS: Generalized interstitial prominence remains without change. There is no airspace consolidation. Heart size is upper normal with pulmonary vascularity within normal limits. No adenopathy. There is degenerative change in thoracic spine. IMPRESSION: Diffuse interstitial prominence remains. This appearance is consistent with a degree of chronic congestive heart failure. There may also be a degree of underlying inflammatory/ fibrotic type change. Both entities may exist concurrently. There is no new opacity compared to prior studies. No airspace consolidation. Heart  upper normal in size. Electronically Signed   By: Lowella Grip III M.D.  On: 02/10/2015 10:16    EKG:   Orders placed or performed during the hospital encounter of 02/10/15  . ED EKG  . ED EKG    ASSESSMENT AND PLAN:   [name] is a 81 y.o. female with a known history of reactive disease status post stent placement, paroxysmal atrial fibrillation, hypertension and hypothyroidism presents to the hospital secondary to worsening cough and also dyspnea.  #1 Pneumonia-community-acquired pneumonia. Blood cultures pending. -Continue on IV Levaquin. Pneumonitis pattern on chest x-ray. -Follow up echocardiogram to rule out underlying congestive heart failure. Hold off on giving IV fluids unless blood pressure is low. -Chest x-ray tomorrow a.m. -On 2 L oxygen, continue to wean as tolerated. Also check ambulatory sats  #2 coronary artery disease status post stent placement-stable at this time.  -Continue aspirin, lovaza -Follow-up echocardiogram.  #3 congestive heart failure-known diastolic dysfunction. Hold Lasix at this time as she clinically appears dry. -Echocardiogram pending. Last echo from April 2016 showing EF greater than 55%.  #4 paroxysmal atrial fibrillation-rate is controlled. Not on any rate limiting medications. -Follows up with [facility] cardiology. -Last note from Dr. Ubaldo Glassing reveals that patient is not a candidate for anticoagulation.  #5 hypothyroidism-continue Synthroid  #6 DVT prophylaxis-on Lovenox  Encourage ambulation today.   All the records are reviewed and case discussed with Care Management/Social Workerr. Management plans discussed with the patient, family and they are in agreement.  CODE STATUS: Full code  TOTAL TIME TAKING CARE OF THIS PATIENT: 35 minutes.   POSSIBLE D/C IN 1-2 DAYS, DEPENDING ON CLINICAL CONDITION.   Gladstone Lighter M.D on 02/11/2015 at 9:14 AM  Between 7am to 6pm - Pager - [phone]  After 6pm go to [email or web address] -  password [email or web address] [place] [facility]  Office  [phone]  CC: Primary care physician; Tracie Harrier, MD

## 2015-02-12 ENCOUNTER — Inpatient Hospital Stay: Payer: Medicare Other

## 2015-02-12 MED ORDER — LEVOFLOXACIN 250 MG PO TABS
250.0000 mg | ORAL_TABLET | Freq: Every day | ORAL | Status: DC
  Administered 2015-02-12: 250 mg via ORAL
  Filled 2015-02-12: qty 1

## 2015-02-12 MED ORDER — ZOLPIDEM TARTRATE 5 MG PO TABS: 5.0000 mg | ORAL_TABLET | Freq: Every evening | ORAL | Status: DC | PRN

## 2015-02-12 MED ORDER — FUROSEMIDE 20 MG PO TABS
20.0000 mg | ORAL_TABLET | ORAL | Status: AC
  Administered 2015-02-12: 20 mg via ORAL
  Filled 2015-02-12 (×2): qty 1

## 2015-02-12 MED ORDER — LEVOFLOXACIN 250 MG PO TABS: 250.0000 mg | ORAL_TABLET | Freq: Every day | ORAL | Status: DC

## 2015-02-12 NOTE — Progress Notes (Signed)
Oral and written AVS instructions with prescriptions for levaquin and ambien given to pt with med education done with stated understanding. Up ad lib. Mild SOB on ambulation with lasix po given. Xray results reviewed with MD aware. Pt eager to go home and states she is ready.

## 2015-02-12 NOTE — Discharge Summary (Signed)
[name] at [facility] NAME: [name]    MR#:  [id number]  DATE OF BIRTH:  Feb 08, 1933  DATE OF ADMISSION:  02/10/2015 ADMITTING PHYSICIAN: Gladstone Lighter, MD  DATE OF DISCHARGE: 02/12/15  PRIMARY CARE PHYSICIAN: Tracie Harrier, MD    ADMISSION DIAGNOSIS:  Shortness of breath [R06.02] Community acquired pneumonia [J18.9] Hypoxia [R09.02]  DISCHARGE DIAGNOSIS:  Active Problems:   Pneumonia   SECONDARY DIAGNOSIS:   Past Medical History  Diagnosis Date  . Coronary artery disease     Status post stent placement in LAD  . Afib ([place])   . Hyperlipemia   . Thyroid disease   . Anxiety   . Cancer ([place])     Lymphoma   . Anxiety   . Congestive heart failure (CHF) ([place])   . GERD (gastroesophageal reflux disease)     HOSPITAL COURSE:   [name] is a 81 y.o. female with a known history of reactive disease status post stent placement, paroxysmal atrial fibrillation, hypertension and hypothyroidism presents to the hospital secondary to worsening cough and also dyspnea.  #1 Pneumonia-community-acquired pneumonia. Blood cultures negative so far. -Improving with levaquin. Pneumonitis pattern on chest x-ray. - Lasix restarted -ECHO with normal EF -weaned off o2, saturating well, ambulating well  #2 coronary artery disease status post stent placement-stable at this time.  -Continue aspirin, lovaza -ECHO normal  #3 congestive heart failure-known diastolic dysfunction.  - lasix restarted at discharge  #4 paroxysmal atrial fibrillation-rate is controlled. Not on any rate limiting medications. -Follows up with [facility] cardiology. -Last note from Dr. Ubaldo Glassing reveals that patient is not a candidate for anticoagulation.  #5 hypothyroidism-continue Synthroid  Clinically improved, discharge today  DISCHARGE CONDITIONS:   Stable  CONSULTS OBTAINED:   None  DRUG ALLERGIES:   Allergies  Allergen Reactions  . Sulfa  Antibiotics Hives and Rash    DISCHARGE MEDICATIONS:   Current Discharge Medication List    START taking these medications   Details  levofloxacin (LEVAQUIN) 250 MG tablet Take 1 tablet (250 mg total) by mouth daily. Qty: 5 tablet, Refills: 0    zolpidem (AMBIEN) 5 MG tablet Take 1 tablet (5 mg total) by mouth at bedtime as needed for sleep. Qty: 10 tablet, Refills: 0      CONTINUE these medications which have NOT CHANGED   Details  allopurinol (ZYLOPRIM) 100 MG tablet Take 1 tablet by mouth.    aspirin EC 81 MG tablet Take 81 mg by mouth daily.    fluticasone (FLONASE) 50 MCG/ACT nasal spray Place 2 sprays into both nostrils daily.  Refills: 3    furosemide (LASIX) 40 MG tablet Take 1 tablet (40 mg total) by mouth daily. Qty: 30 tablet, Refills: 1    gabapentin (NEURONTIN) 300 MG capsule Take 300 mg by mouth 2 (two) times daily. Refills: 2    HYDROcodone-acetaminophen (NORCO/VICODIN) 5-325 MG per tablet Take 1-2 tablets by mouth every 4 (four) hours as needed for moderate pain. Qty: 30 tablet, Refills: 0    levothyroxine (SYNTHROID, LEVOTHROID) 25 MCG tablet Take 1 tablet (25 mcg total) by mouth daily before breakfast. Take 30-60 minutes before breakfast, Qty: 30 tablet, Refills: 5    nitroGLYCERIN (NITROSTAT) 0.4 MG SL tablet Place 0.4 mg under the tongue every 5 (five) minutes x 3 doses as needed. For chest pain    Omega-3 Fatty Acids (FISH OIL) 1000 MG CAPS Take 1,000 mg by mouth daily.    pantoprazole (PROTONIX) 40 MG tablet  Take 40 mg by mouth daily.    potassium chloride (K-DUR,KLOR-CON) 10 MEQ tablet Take 10 mEq by mouth 2 (two) times daily.    rOPINIRole (REQUIP) 0.5 MG tablet Take 0.5 mg by mouth at bedtime.      STOP taking these medications     cephALEXin (KEFLEX) 500 MG capsule      ibuprofen (ADVIL,MOTRIN) 600 MG tablet          DISCHARGE INSTRUCTIONS:   1. PCP f/u in 1 week  If you experience worsening of your admission symptoms, develop  shortness of breath, life threatening emergency, suicidal or homicidal thoughts you must seek medical attention immediately by calling 911 or calling your MD immediately  if symptoms less severe.  You Must read complete instructions/literature along with all the possible adverse reactions/side effects for all the Medicines you take and that have been prescribed to you. Take any new Medicines after you have completely understood and accept all the possible adverse reactions/side effects.   Please note  You were cared for by a hospitalist during your hospital stay. If you have any questions about your discharge medications or the care you received while you were in the hospital after you are discharged, you can call the unit and asked to speak with the hospitalist on call if the hospitalist that took care of you is not available. Once you are discharged, your primary care physician will handle any further medical issues. Please note that NO REFILLS for any discharge medications will be authorized once you are discharged, as it is imperative that you return to your primary care physician (or establish a relationship with a primary care physician if you do not have one) for your aftercare needs so that they can reassess your need for medications and monitor your lab values.    Today   CHIEF COMPLAINT:   Chief Complaint  Patient presents with  . Shortness of Breath    VITAL SIGNS:  Blood pressure 120/57, pulse 68, temperature 98 F (36.7 C), temperature source Oral, resp. rate 17, height 5\' 1"  (1.549 m), weight 65.772 kg (145 lb), SpO2 100 %.  I/O:   Intake/Output Summary (Last 24 hours) at 02/12/15 1019 Last data filed at 02/12/15 0900  Gross per 24 hour  Intake    720 ml  Output      0 ml  Net    720 ml    PHYSICAL EXAMINATION:   Physical Exam  GENERAL: 81 y.o.-year-old patient lying in the bed with no acute distress.  EYES: Pupils equal, round, reactive to light and  accommodation. No scleral icterus. Extraocular muscles intact.  HEENT: Head atraumatic, normocephalic. Oropharynx and nasopharynx clear.  NECK: Supple, no jugular venous distention. No thyroid enlargement, no tenderness.  LUNGS: Normal breath sounds bilaterally, no wheezing,rhonchi.  No use of accessory muscles of respiration.  CARDIOVASCULAR: S1, S2 normal. No murmurs, rubs, or gallops.  ABDOMEN: Soft, nontender, nondistended. Bowel sounds present. No organomegaly or mass.  EXTREMITIES: No pedal edema, cyanosis, or clubbing.  NEUROLOGIC: Cranial nerves II through XII are intact. Muscle strength 5/5 in all extremities. Sensation intact. Gait not checked.  PSYCHIATRIC: The patient is alert and oriented x 3.  SKIN: No obvious rash, lesion, or ulcer.   DATA REVIEW:   CBC  Recent Labs Lab 02/11/15 0459  WBC 5.8  HGB 11.2*  HCT 34.0*  PLT 294    Chemistries   Recent Labs Lab 02/11/15 0459  NA 141  K 3.5  CL  111  CO2 25  GLUCOSE 91  BUN 18  CREATININE 1.23*  CALCIUM 8.7*    Cardiac Enzymes  Recent Labs Lab 02/10/15 0944  TROPONINI <0.03    Microbiology Results  Results for orders placed or performed during the hospital encounter of 02/10/15  Blood culture (routine x 2)     Status: None (Preliminary result)   Collection Time: 02/10/15  9:43 AM  Result Value Ref Range Status   Specimen Description BLOOD RIGHT ARM  Final   Special Requests BOTTLES DRAWN AEROBIC AND ANAEROBIC [place]  Final   Culture NO GROWTH 2 DAYS  Final   Report Status PENDING  Incomplete  Blood culture (routine x 2)     Status: None (Preliminary result)   Collection Time: 02/10/15  9:45 AM  Result Value Ref Range Status   Specimen Description BLOOD LEFT ARM  Final   Special Requests BOTTLES DRAWN AEROBIC AND ANAEROBIC [place]  Final   Culture NO GROWTH 2 DAYS  Final   Report Status PENDING  Incomplete  Urine culture     Status: None (Preliminary result)   Collection Time: 02/10/15  1:04 PM   Result Value Ref Range Status   Specimen Description URINE, RANDOM  Final   Special Requests NONE  Final   Culture 40,000 COLONIES/ml YEAST  Final   Report Status PENDING  Incomplete    RADIOLOGY:  Dg Chest 2 View  02/11/2015  CLINICAL DATA:  Fever, chest pain, presented on Friday, history of coronary artery disease post coronary stenting, CHF, atrial fibrillation EXAM: CHEST  2 VIEW COMPARISON:  02/10/2015 FINDINGS: Normal heart size and pulmonary vascularity. Atherosclerotic calcification elongation of thoracic aorta. Persistent interstitial infiltrates, slightly increased at LEFT base, question pulmonary edema, less likely atypical infection. Difficult to exclude component of underlying chronic interstitial lung disease. No segmental consolidation, pleural effusion or pneumothorax. Bones demineralized. IMPRESSION: Persistent interstitial infiltrates, slightly increased at LEFT base, question pulmonary edema. Electronically Signed   By: Lavonia Dana M.D.   On: 02/11/2015 11:21    EKG:   Orders placed or performed during the hospital encounter of 02/10/15  . ED EKG  . ED EKG      Management plans discussed with the patient, family and they are in agreement.  CODE STATUS:     Code Status Orders        Start     Ordered   02/10/15 1354  Full code   Continuous     02/10/15 1353      TOTAL TIME TAKING CARE OF THIS PATIENT: 37 minutes.    Gladstone Lighter M.D on 02/12/2015 at 10:19 AM  Between 7am to 6pm - Pager - [phone]  After 6pm go to [email or web address] - password [email or web address] [facility]  Office  [phone]  CC: Primary care physician; Tracie Harrier, MD

## 2015-02-12 NOTE — Discharge Instructions (Signed)

## 2015-02-12 NOTE — Progress Notes (Signed)
Discharged home in care of dgt with pt transported to private vehicle via [place].

## 2015-02-12 NOTE — Plan of Care (Signed)
Problem: Safety: Goal: Ability to remain free from injury will improve Outcome: Progressing Pt is alert and oriented, no c/o pain at this time. Pt is independent in room, walks in hall way. Requested sleep medication, Ambien given with effect.     Problem: Respiratory: Goal: Respiratory status will improve Outcome: Progressing Pt has normal oxygen saturations on RA. Encouraged to call for assistance when needed. Remains afebrile at this time.

## 2015-02-13 ENCOUNTER — Telehealth: Payer: Self-pay | Admitting: Obstetrics and Gynecology

## 2015-02-13 LAB — URINE CULTURE: Culture: 40000

## 2015-02-13 NOTE — Telephone Encounter (Signed)
Patient called and said she didn't have her CT scan done because she wasn't feeling well and right now she doesn't feel like having any of this stuff done right now. She cancelled her cysto and said she will call later to reschd her CT scan and her cysto when she feels like having it done. She is just not feeling up tp having anything done right now.  [name]

## 2015-02-14 ENCOUNTER — Other Ambulatory Visit: Payer: Medicare Other | Admitting: Urology

## 2015-02-15 LAB — CULTURE, BLOOD (ROUTINE X 2)
CULTURE: NO GROWTH
Culture: NO GROWTH

## 2015-02-16 ENCOUNTER — Other Ambulatory Visit: Payer: Medicare Other

## 2015-02-17 ENCOUNTER — Other Ambulatory Visit: Payer: Self-pay | Admitting: Internal Medicine

## 2015-02-17 DIAGNOSIS — J189 Pneumonia, unspecified organism: Secondary | ICD-10-CM

## 2015-02-18 LAB — BLOOD GAS, VENOUS
ACID-BASE EXCESS: 1.3 mmol/L (ref 0.0–3.0)
BICARBONATE: 25.9 meq/L (ref 21.0–28.0)
PATIENT TEMPERATURE: 37
PCO2 VEN: 40 mmHg — AB (ref 44.0–60.0)
pH, Ven: 7.42 (ref 7.320–7.430)

## 2015-02-23 ENCOUNTER — Ambulatory Visit
Admission: RE | Admit: 2015-02-23 | Discharge: 2015-02-23 | Disposition: A | Discharge status: Home / Self Care | Payer: Medicare Other | Source: Ambulatory Visit | Attending: Internal Medicine | Admitting: Internal Medicine

## 2015-02-23 DIAGNOSIS — J189 Pneumonia, unspecified organism: Secondary | ICD-10-CM

## 2015-02-23 DIAGNOSIS — Z09 Encounter for follow-up examination after completed treatment for conditions other than malignant neoplasm: Secondary | ICD-10-CM | POA: Diagnosis present

## 2015-02-23 DIAGNOSIS — R918 Other nonspecific abnormal finding of lung field: Secondary | ICD-10-CM | POA: Insufficient documentation

## 2015-02-23 DIAGNOSIS — R634 Abnormal weight loss: Secondary | ICD-10-CM | POA: Insufficient documentation

## 2015-02-23 DIAGNOSIS — R0602 Shortness of breath: Secondary | ICD-10-CM | POA: Diagnosis present

## 2015-02-23 MED ORDER — IOHEXOL 300 MG/ML  SOLN
60.0000 mL | Freq: Once | INTRAMUSCULAR | Status: AC | PRN
  Administered 2015-02-23: 60 mL via INTRAVENOUS

## 2015-03-13 ENCOUNTER — Telehealth: Payer: Self-pay | Admitting: Obstetrics and Gynecology

## 2015-03-13 DIAGNOSIS — R319 Hematuria, unspecified: Secondary | ICD-10-CM

## 2015-03-13 NOTE — Telephone Encounter (Signed)
Michelle sent me a message last month that this patient canceled her CT Urogram and her cystoscopy.  It does not seem that she has rescheduled .  Will you please call her when you get a chance and see if she would like to reschedule now?  Please make sure that she understands that a potential urinary tract cancer could go undiagnosed, possible leading to death, if these studies are not performed.  Thanks

## 2015-03-13 NOTE — Telephone Encounter (Signed)
Lm for pt to cb to let her know that i have got this approved and she needs a follow up appt for results  michelle

## 2015-03-13 NOTE — Telephone Encounter (Signed)
Spoke with pt in reference to CT and cysto. Pt stated that she does want to have these done at this time. I put in the orders for CT urogram.  Sharyn Lull can you schedule the cysto?

## 2015-03-20 NOTE — Telephone Encounter (Signed)
Patient called back today and i made her cysto and ct results appt and gave her the number to scheduling since she said she was in pain and wanted to go ahead and make her appt for her CT. She is calling to schedule that now.  michelle

## 2015-03-23 DIAGNOSIS — N183 Chronic kidney disease, stage 3 unspecified: Secondary | ICD-10-CM | POA: Insufficient documentation

## 2015-03-24 ENCOUNTER — Ambulatory Visit
Admission: RE | Admit: 2015-03-24 | Discharge: 2015-03-24 | Disposition: A | Discharge status: Home / Self Care | Payer: Medicare Other | Source: Ambulatory Visit | Attending: Obstetrics and Gynecology | Admitting: Obstetrics and Gynecology

## 2015-03-24 DIAGNOSIS — M47816 Spondylosis without myelopathy or radiculopathy, lumbar region: Secondary | ICD-10-CM | POA: Diagnosis not present

## 2015-03-24 DIAGNOSIS — N200 Calculus of kidney: Secondary | ICD-10-CM | POA: Diagnosis not present

## 2015-03-24 DIAGNOSIS — M5136 Other intervertebral disc degeneration, lumbar region: Secondary | ICD-10-CM | POA: Insufficient documentation

## 2015-03-24 DIAGNOSIS — K5730 Diverticulosis of large intestine without perforation or abscess without bleeding: Secondary | ICD-10-CM | POA: Diagnosis not present

## 2015-03-24 DIAGNOSIS — J986 Disorders of diaphragm: Secondary | ICD-10-CM | POA: Insufficient documentation

## 2015-03-24 DIAGNOSIS — R319 Hematuria, unspecified: Secondary | ICD-10-CM | POA: Diagnosis present

## 2015-03-24 DIAGNOSIS — N281 Cyst of kidney, acquired: Secondary | ICD-10-CM | POA: Diagnosis not present

## 2015-03-24 LAB — POCT I-STAT CREATININE: CREATININE: 1.1 mg/dL — AB (ref 0.44–1.00)

## 2015-03-24 MED ORDER — IOHEXOL 300 MG/ML  SOLN
100.0000 mL | Freq: Once | INTRAMUSCULAR | Status: AC | PRN
  Administered 2015-03-24: 100 mL via INTRAVENOUS

## 2015-03-28 ENCOUNTER — Encounter: Payer: Self-pay | Admitting: Urology

## 2015-03-28 ENCOUNTER — Ambulatory Visit (INDEPENDENT_AMBULATORY_CARE_PROVIDER_SITE_OTHER): Payer: Medicare Other | Admitting: Urology

## 2015-03-28 VITALS — BP 137/79 | HR 74 | Ht 60.0 in | Wt 146.3 lb

## 2015-03-28 DIAGNOSIS — R310 Gross hematuria: Secondary | ICD-10-CM | POA: Diagnosis not present

## 2015-03-28 DIAGNOSIS — N200 Calculus of kidney: Secondary | ICD-10-CM

## 2015-03-28 DIAGNOSIS — R319 Hematuria, unspecified: Secondary | ICD-10-CM | POA: Diagnosis not present

## 2015-03-28 DIAGNOSIS — I4891 Unspecified atrial fibrillation: Secondary | ICD-10-CM | POA: Insufficient documentation

## 2015-03-28 LAB — URINALYSIS, COMPLETE
BILIRUBIN UA: NEGATIVE
Glucose, UA: NEGATIVE
Ketones, UA: NEGATIVE
Nitrite, UA: POSITIVE — AB
PH UA: 5.5 (ref 5.0–7.5)
RBC UA: NEGATIVE
Specific Gravity, UA: 1.02 (ref 1.005–1.030)
Urobilinogen, Ur: 0.2 mg/dL (ref 0.2–1.0)

## 2015-03-28 LAB — MICROSCOPIC EXAMINATION

## 2015-03-28 MED ORDER — LIDOCAINE HCL 2 % EX GEL
1.0000 "application " | Freq: Once | CUTANEOUS | Status: AC
  Administered 2015-03-28: 1 via URETHRAL

## 2015-03-28 MED ORDER — CIPROFLOXACIN HCL 500 MG PO TABS
500.0000 mg | ORAL_TABLET | Freq: Once | ORAL | Status: AC
  Administered 2015-03-28: 500 mg via ORAL
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
## 2015-12-19 ENCOUNTER — Ambulatory Visit (INDEPENDENT_AMBULATORY_CARE_PROVIDER_SITE_OTHER): Payer: Medicare Other | Admitting: Vascular Surgery

## 2016-01-09 ENCOUNTER — Ambulatory Visit (INDEPENDENT_AMBULATORY_CARE_PROVIDER_SITE_OTHER): Payer: Medicare Other | Admitting: Vascular Surgery

## 2016-01-23 ENCOUNTER — Encounter (INDEPENDENT_AMBULATORY_CARE_PROVIDER_SITE_OTHER): Payer: Self-pay | Admitting: Vascular Surgery

## 2016-01-23 ENCOUNTER — Ambulatory Visit (INDEPENDENT_AMBULATORY_CARE_PROVIDER_SITE_OTHER): Payer: Medicare Other | Admitting: Vascular Surgery

## 2016-01-23 VITALS — BP 138/80 | HR 78 | Resp 16 | Ht 61.0 in | Wt 150.0 lb

## 2016-01-23 DIAGNOSIS — I83812 Varicose veins of left lower extremities with pain: Secondary | ICD-10-CM | POA: Diagnosis not present
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
## 2016-02-20 ENCOUNTER — Ambulatory Visit (INDEPENDENT_AMBULATORY_CARE_PROVIDER_SITE_OTHER): Payer: Medicare Other | Admitting: Vascular Surgery

## 2016-02-27 ENCOUNTER — Ambulatory Visit (INDEPENDENT_AMBULATORY_CARE_PROVIDER_SITE_OTHER): Payer: Medicare Other | Admitting: Vascular Surgery

## 2016-03-28 ENCOUNTER — Ambulatory Visit: Payer: Medicare Other

## 2016-03-29 DIAGNOSIS — G5602 Carpal tunnel syndrome, left upper limb: Secondary | ICD-10-CM | POA: Insufficient documentation

## 2016-04-02 ENCOUNTER — Encounter (INDEPENDENT_AMBULATORY_CARE_PROVIDER_SITE_OTHER): Payer: Self-pay | Admitting: Vascular Surgery

## 2016-04-02 ENCOUNTER — Ambulatory Visit (INDEPENDENT_AMBULATORY_CARE_PROVIDER_SITE_OTHER): Payer: Medicare Other | Admitting: Vascular Surgery

## 2016-04-02 VITALS — BP 131/78 | HR 80 | Resp 16 | Ht 61.0 in | Wt 153.0 lb

## 2016-04-02 DIAGNOSIS — E785 Hyperlipidemia, unspecified: Secondary | ICD-10-CM

## 2016-04-02 DIAGNOSIS — I83812 Varicose veins of left lower extremities with pain: Secondary | ICD-10-CM | POA: Diagnosis not present

## 2016-04-02 NOTE — Assessment & Plan Note (Signed)
lipid control important in reducing the progression of atherosclerotic disease. Continue statin therapy  
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
## 2016-04-02 NOTE — Assessment & Plan Note (Signed)
The area of induration and inflammation directly overlies the previously treated branches of the saphenous vein that had foam sclerotherapy performed. She is taking a little longer to resolve the inflammation the most, but this is not out of an expected range. She does still have residual painful varicosities that she would like treated which sounds reasonable to me. This will be done at her convenience in the near future.

## 2016-04-02 NOTE — Patient Instructions (Signed)
Varicose Vein Surgery Varicose vein surgery is a procedure to remove or repair varicose veins. These are swollen, twisted veins that are visible under the skin, especially in your legs. These veins may appear blue and bulging. All veins have a valve that keeps blood flowing in only one direction. If these valves get weak or damaged, blood can pool and cause varicose veins. You may have varicose vein surgery if your varicose veins are causing symptoms or complications, or if lifestyle changes have not helped. The surgery can reduce pain, aching, and the risk of bleeding and blood clots. Surgery can also improve the way the affected area looks (cosmetic appearance). The different types of varicose vein surgery include:  Injecting a chemical to close off a vein (sclerotherapy).  Treating a vein with light energy (laser treatment).  Using lasers or radio waves to close off a vein with heat (radiofrequency vein ablation).  Surgically removing the vein through a small incision (phlebectomy).  Surgically removing the vein through incisions after the vein has been tied off (vein ligation and stripping). Your health care provider will discuss the method that is best for you based on your condition. Tell a health care provider about:  Any allergies you have.  All medicines you are taking, including vitamins, herbs, eye drops, creams, and over-the-counter medicines.  Any problems you or family members have had with anesthetic medicines.  Any blood disorders you have.  Any surgeries you have had.  Any medical conditions you have. What are the risks? Generally, this is a safe procedure. However, problems can occur and include:  Damage to nearby nerves, tissues, or veins.  Sores.  Dark spots.  Skin irritation.  Numbness.  Clotting.  Infection.  Scarring.  Leg swelling.  Need for additional treatments. What happens before the procedure?  Ask your health care provider  about:  Changing or stopping your regular medicines. This is especially important if you are taking diabetes medicines or blood thinners.  Taking medicines such as aspirin and ibuprofen. These medicines can thin your blood. Do not take these medicines before your procedure if your health care provider instructs you not to.  You may have tests before varicose vein surgery. These can include a test to:  Check for clots and check blood flow using sound waves (Doppler ultrasound).  Observe how blood flows through your veins by injecting a dye that outlines your veins on X-rays (angiogram). This test is used in rare cases. What happens during the procedure? The procedure will vary depending on which type of varicose vein surgery you have: Sclerotherapy  This procedure is often used for small to medium veins.  A chemical (sclerosant) that irritates the lining of the vein will be injected into the vein. This will cause the varicose vein to be closed off.  Sclerosants in different amounts and strengths can be used, depending on the size and location of the vein.  You may need more than one treatment. Laser Treatment  This procedure does not involve incisions or chemicals.  Light energy from a laser will be directed onto the vein.  Laser treatment may be combined with sclerotherapy.  You may need more than one treatment. Radiofrequency Vein Ablation  You will be given a medicine that numbs the area (local anesthetic).  A small surgical cut (incision) will be made near the varicose vein.  A narrow tube (catheter) will be threaded into your vein.  The tip of the catheter will use either a laser or radio waves  to close the vein with heat. Phlebectomy  This surgical procedure is used to remove the veins closest to the skin.  You will be given a medicine that numbs the area (local anesthetic).  The surgeon will make a small puncture close to the varicose vein and then use a tiny hook  to pull out the vein. Vein Ligation and Stripping  This surgical procedure is used to treat severe cases.  For this procedure, you will be given a medicine that makes you go to sleep (general anesthetic).  The surgeon will make a small incision near the vein in your groin (saphenous vein).  First the surgeon will tie off (ligate) the vein.  Then the surgeon will make several more incisions along the vein.  The vein will be removed (stripped).  It may take 1-4 weeks to recover completely. What happens after the procedure?  Depending on the type of procedure that is performed, you may be able to return to your usual activities the day after the procedure.  You may have to wear compression stockings. These stockings help to prevent blood clots and reduce swelling in your legs. This information is not intended to replace advice given to you by your health care provider. Make sure you discuss any questions you have with your health care provider. Document Released: 03/03/2007 Document Revised: 07/20/2015 Document Reviewed: 07/21/2013 Elsevier Interactive Patient Education  2017 [employer].

## 2016-05-03 ENCOUNTER — Ambulatory Visit (INDEPENDENT_AMBULATORY_CARE_PROVIDER_SITE_OTHER): Payer: Medicare Other | Admitting: Vascular Surgery

## 2016-05-03 ENCOUNTER — Encounter (INDEPENDENT_AMBULATORY_CARE_PROVIDER_SITE_OTHER): Payer: Self-pay

## 2016-05-03 ENCOUNTER — Encounter (INDEPENDENT_AMBULATORY_CARE_PROVIDER_SITE_OTHER): Payer: Self-pay | Admitting: Vascular Surgery

## 2016-05-03 VITALS — BP 117/67 | HR 80 | Resp 16 | Wt 154.0 lb

## 2016-05-03 DIAGNOSIS — I83812 Varicose veins of left lower extremities with pain: Secondary | ICD-10-CM | POA: Diagnosis not present

## 2016-05-03 NOTE — Patient Instructions (Signed)
Varicose Vein Surgery, Care After Refer to this sheet in the next few weeks. These instructions provide you with information about caring for yourself after your procedure. Your health care provider may also give you more specific instructions. Your treatment has been planned according to current medical practices, but problems sometimes occur. Call your health care provider if you have any problems or questions after your procedure. What can I expect after the procedure? After your procedure, it is typical to have the following:  Swelling.  Bruising.  Soreness.  Mild skin discoloration.  Slight bleeding at incision sites. Follow these instructions at home:  Take medicines only as directed by your health care provider.  Wear compression stockings as directed by your health care provider. These stockings help to prevent blood clots and reduce swelling in your legs.  There are many different ways to close and cover an incision, including stitches (sutures), skin glue, and adhesive strips. Follow your health care provider's instructions on:  Incision care.  Bandage (dressing) changes and removal.  Incision closure removal.  Wear loose-fitting clothing.  Get regular daily exercise. Walk or ride a stationary bike daily or as directed by your health care provider.  Ask your health care provider when you can return to work. This may depend on the type of work you do.  Be patient with your recovery. It can take up to 4 weeks to get back to your usual activities. Contact a health care provider if:  You have a fever.  You have drainage, redness, swelling, or pain at an incision site.  You develop a cough. Get help right away if:  You pass out.  You have very bad pain in your leg.  You have leg pain that gets worse when you walk.  You have redness or swelling in your leg that is getting worse.  You have trouble breathing.  You cough up blood. This information is not  intended to replace advice given to you by your health care provider. Make sure you discuss any questions you have with your health care provider. Document Released: 10/15/2013 Document Revised: 07/20/2015 Document Reviewed: 07/21/2013 Elsevier Interactive Patient Education  2017 [employer].
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
## 2016-05-31 ENCOUNTER — Other Ambulatory Visit: Payer: Self-pay | Admitting: Internal Medicine

## 2016-05-31 DIAGNOSIS — R221 Localized swelling, mass and lump, neck: Secondary | ICD-10-CM

## 2016-06-07 ENCOUNTER — Ambulatory Visit (INDEPENDENT_AMBULATORY_CARE_PROVIDER_SITE_OTHER): Payer: Medicare Other | Admitting: Vascular Surgery

## 2016-06-20 ENCOUNTER — Other Ambulatory Visit: Payer: Self-pay | Admitting: Internal Medicine

## 2016-06-20 ENCOUNTER — Ambulatory Visit
Admission: RE | Admit: 2016-06-20 | Discharge: 2016-06-20 | Disposition: A | Discharge status: Home / Self Care | Payer: Medicare Other | Source: Ambulatory Visit | Attending: Internal Medicine | Admitting: Internal Medicine

## 2016-06-20 DIAGNOSIS — R221 Localized swelling, mass and lump, neck: Secondary | ICD-10-CM

## 2016-06-20 LAB — POCT I-STAT CREATININE: Creatinine, Ser: 1.1 mg/dL — ABNORMAL HIGH (ref 0.44–1.00)

## 2016-06-20 MED ORDER — IOPAMIDOL (ISOVUE-300) INJECTION 61%
60.0000 mL | Freq: Once | INTRAVENOUS | Status: AC | PRN
  Administered 2016-06-20: 60 mL via INTRAVENOUS

## 2016-06-21 ENCOUNTER — Ambulatory Visit (INDEPENDENT_AMBULATORY_CARE_PROVIDER_SITE_OTHER): Payer: Medicare Other | Admitting: Vascular Surgery

## 2016-06-21 ENCOUNTER — Encounter (INDEPENDENT_AMBULATORY_CARE_PROVIDER_SITE_OTHER): Payer: Self-pay | Admitting: Vascular Surgery

## 2016-06-21 VITALS — BP 141/68 | HR 71 | Resp 16 | Ht 61.0 in | Wt 149.0 lb

## 2016-06-21 DIAGNOSIS — I83812 Varicose veins of left lower extremities with pain: Secondary | ICD-10-CM
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
## 2016-08-17 ENCOUNTER — Emergency Department: Payer: No Typology Code available for payment source

## 2016-08-17 ENCOUNTER — Encounter: Payer: Self-pay | Admitting: Emergency Medicine

## 2016-08-17 ENCOUNTER — Emergency Department
Admission: EM | Admit: 2016-08-17 | Discharge: 2016-08-17 | Disposition: A | Discharge status: Home / Self Care | Payer: No Typology Code available for payment source | Attending: Emergency Medicine | Admitting: Emergency Medicine

## 2016-08-17 DIAGNOSIS — S022XXA Fracture of nasal bones, initial encounter for closed fracture: Secondary | ICD-10-CM | POA: Diagnosis not present

## 2016-08-17 DIAGNOSIS — Y92410 Unspecified street and highway as the place of occurrence of the external cause: Secondary | ICD-10-CM | POA: Insufficient documentation

## 2016-08-17 DIAGNOSIS — R0789 Other chest pain: Secondary | ICD-10-CM | POA: Diagnosis not present

## 2016-08-17 DIAGNOSIS — Y9389 Activity, other specified: Secondary | ICD-10-CM | POA: Diagnosis not present

## 2016-08-17 DIAGNOSIS — S0083XA Contusion of other part of head, initial encounter: Secondary | ICD-10-CM | POA: Diagnosis not present

## 2016-08-17 DIAGNOSIS — S0993XA Unspecified injury of face, initial encounter: Secondary | ICD-10-CM | POA: Diagnosis present

## 2016-08-17 DIAGNOSIS — Y998 Other external cause status: Secondary | ICD-10-CM | POA: Diagnosis not present

## 2016-08-17 LAB — CBC
HCT: 39.7 % (ref 35.0–47.0)
Hemoglobin: 13.5 g/dL (ref 12.0–16.0)
MCH: 32.2 pg (ref 26.0–34.0)
MCHC: 33.9 g/dL (ref 32.0–36.0)
MCV: 94.9 fL (ref 80.0–100.0)
Platelets: 235 10*3/uL (ref 150–440)
RBC: 4.19 MIL/uL (ref 3.80–5.20)
RDW: 14.7 % — ABNORMAL HIGH (ref 11.5–14.5)
WBC: 8.5 10*3/uL (ref 3.6–11.0)

## 2016-08-17 LAB — COMPREHENSIVE METABOLIC PANEL
ALBUMIN: 3.6 g/dL (ref 3.5–5.0)
ALT: 23 U/L (ref 14–54)
AST: 39 U/L (ref 15–41)
Alkaline Phosphatase: 77 U/L (ref 38–126)
Anion gap: 7 (ref 5–15)
BUN: 20 mg/dL (ref 6–20)
CHLORIDE: 107 mmol/L (ref 101–111)
CO2: 25 mmol/L (ref 22–32)
Calcium: 8.9 mg/dL (ref 8.9–10.3)
Creatinine, Ser: 1.06 mg/dL — ABNORMAL HIGH (ref 0.44–1.00)
GFR calc non Af Amer: 48 mL/min — ABNORMAL LOW (ref >60)
GFR, EST AFRICAN AMERICAN: 56 mL/min — AB (ref >60)
GLUCOSE: 100 mg/dL — AB (ref 65–99)
Potassium: 3.8 mmol/L (ref 3.5–5.1)
SODIUM: 139 mmol/L (ref 135–145)
Total Bilirubin: 0.6 mg/dL (ref 0.3–1.2)
Total Protein: 7 g/dL (ref 6.5–8.1)

## 2016-08-17 LAB — TROPONIN I

## 2016-08-17 MED ORDER — OXYCODONE-ACETAMINOPHEN 5-325 MG PO TABS: 2.0000 | ORAL_TABLET | Freq: Four times a day (QID) | ORAL | 0 refills | Status: DC | PRN

## 2016-08-17 MED ORDER — IOPAMIDOL (ISOVUE-300) INJECTION 61%
75.0000 mL | Freq: Once | INTRAVENOUS | Status: AC | PRN
  Administered 2016-08-17: 75 mL via INTRAVENOUS
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
## 2016-08-17 NOTE — ED Notes (Signed)
Pt. Going home with family. 
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
## 2017-07-24 ENCOUNTER — Other Ambulatory Visit: Payer: Self-pay | Admitting: Internal Medicine

## 2017-07-24 DIAGNOSIS — R112 Nausea with vomiting, unspecified: Secondary | ICD-10-CM

## 2017-07-24 DIAGNOSIS — K219 Gastro-esophageal reflux disease without esophagitis: Secondary | ICD-10-CM | POA: Insufficient documentation

## 2017-07-25 ENCOUNTER — Other Ambulatory Visit: Payer: Self-pay | Admitting: Internal Medicine

## 2017-07-25 DIAGNOSIS — K219 Gastro-esophageal reflux disease without esophagitis: Secondary | ICD-10-CM

## 2017-07-25 DIAGNOSIS — R112 Nausea with vomiting, unspecified: Secondary | ICD-10-CM

## 2017-07-31 ENCOUNTER — Ambulatory Visit
Admission: RE | Admit: 2017-07-31 | Discharge: 2017-07-31 | Disposition: A | Discharge status: Home / Self Care | Payer: Medicare Other | Source: Ambulatory Visit | Attending: Internal Medicine | Admitting: Internal Medicine

## 2017-07-31 DIAGNOSIS — K5710 Diverticulosis of small intestine without perforation or abscess without bleeding: Secondary | ICD-10-CM | POA: Insufficient documentation

## 2017-07-31 DIAGNOSIS — K219 Gastro-esophageal reflux disease without esophagitis: Secondary | ICD-10-CM | POA: Insufficient documentation

## 2017-07-31 DIAGNOSIS — R112 Nausea with vomiting, unspecified: Secondary | ICD-10-CM | POA: Diagnosis present

## 2017-09-05 ENCOUNTER — Ambulatory Visit
Admission: RE | Admit: 2017-09-05 | Discharge: 2017-09-05 | Disposition: A | Discharge status: Home / Self Care | Payer: Medicare Other | Source: Ambulatory Visit | Attending: Internal Medicine | Admitting: Internal Medicine

## 2017-09-05 ENCOUNTER — Other Ambulatory Visit: Payer: Self-pay | Admitting: Internal Medicine

## 2017-09-05 DIAGNOSIS — R600 Localized edema: Secondary | ICD-10-CM | POA: Diagnosis not present

## 2017-11-11 DIAGNOSIS — C4491 Basal cell carcinoma of skin, unspecified: Secondary | ICD-10-CM

## 2017-11-11 HISTORY — DX: Basal cell carcinoma of skin, unspecified: C44.91

## 2017-12-01 ENCOUNTER — Other Ambulatory Visit: Payer: Self-pay

## 2017-12-01 ENCOUNTER — Encounter: Payer: Self-pay | Admitting: Oncology

## 2017-12-01 ENCOUNTER — Inpatient Hospital Stay: Payer: Medicare Other

## 2017-12-01 ENCOUNTER — Encounter (INDEPENDENT_AMBULATORY_CARE_PROVIDER_SITE_OTHER): Payer: Self-pay

## 2017-12-01 ENCOUNTER — Inpatient Hospital Stay: Payer: Medicare Other | Attending: Oncology | Admitting: Oncology

## 2017-12-01 VITALS — BP 115/57 | HR 97 | Temp 97.3°F | Resp 18 | Ht 60.5 in | Wt 154.0 lb

## 2017-12-01 DIAGNOSIS — Z79899 Other long term (current) drug therapy: Secondary | ICD-10-CM

## 2017-12-01 DIAGNOSIS — E876 Hypokalemia: Secondary | ICD-10-CM | POA: Diagnosis not present

## 2017-12-01 DIAGNOSIS — K219 Gastro-esophageal reflux disease without esophagitis: Secondary | ICD-10-CM | POA: Insufficient documentation

## 2017-12-01 DIAGNOSIS — E785 Hyperlipidemia, unspecified: Secondary | ICD-10-CM | POA: Insufficient documentation

## 2017-12-01 DIAGNOSIS — Z7982 Long term (current) use of aspirin: Secondary | ICD-10-CM | POA: Diagnosis not present

## 2017-12-01 DIAGNOSIS — R221 Localized swelling, mass and lump, neck: Secondary | ICD-10-CM | POA: Diagnosis not present

## 2017-12-01 DIAGNOSIS — F419 Anxiety disorder, unspecified: Secondary | ICD-10-CM | POA: Diagnosis not present

## 2017-12-01 DIAGNOSIS — Z8579 Personal history of other malignant neoplasms of lymphoid, hematopoietic and related tissues: Secondary | ICD-10-CM

## 2017-12-01 DIAGNOSIS — Z8572 Personal history of non-Hodgkin lymphomas: Secondary | ICD-10-CM | POA: Diagnosis not present

## 2017-12-01 DIAGNOSIS — I509 Heart failure, unspecified: Secondary | ICD-10-CM | POA: Insufficient documentation

## 2017-12-01 DIAGNOSIS — Z955 Presence of coronary angioplasty implant and graft: Secondary | ICD-10-CM | POA: Diagnosis not present

## 2017-12-01 DIAGNOSIS — E041 Nontoxic single thyroid nodule: Secondary | ICD-10-CM | POA: Diagnosis not present

## 2017-12-01 DIAGNOSIS — I2510 Atherosclerotic heart disease of native coronary artery without angina pectoris: Secondary | ICD-10-CM | POA: Insufficient documentation

## 2017-12-01 DIAGNOSIS — I4891 Unspecified atrial fibrillation: Secondary | ICD-10-CM | POA: Diagnosis not present

## 2017-12-01 LAB — URIC ACID: Uric Acid, Serum: 6.4 mg/dL (ref 2.5–7.1)

## 2017-12-01 LAB — CBC WITH DIFFERENTIAL/PLATELET
Basophils Absolute: 0 10*3/uL (ref 0–0.1)
Basophils Relative: 1 %
EOS ABS: 0.3 10*3/uL (ref 0–0.7)
EOS PCT: 5 %
HCT: 38.9 % (ref 35.0–47.0)
Hemoglobin: 13.3 g/dL (ref 12.0–16.0)
LYMPHS PCT: 32 %
Lymphs Abs: 2.2 10*3/uL (ref 1.0–3.6)
MCH: 32.5 pg (ref 26.0–34.0)
MCHC: 34.2 g/dL (ref 32.0–36.0)
MCV: 95.1 fL (ref 80.0–100.0)
MONO ABS: 0.7 10*3/uL (ref 0.2–0.9)
Monocytes Relative: 10 %
Neutro Abs: 3.5 10*3/uL (ref 1.4–6.5)
Neutrophils Relative %: 52 %
Platelets: 258 10*3/uL (ref 150–440)
RBC: 4.09 MIL/uL (ref 3.80–5.20)
RDW: 14.9 % — AB (ref 11.5–14.5)
WBC: 6.8 10*3/uL (ref 3.6–11.0)

## 2017-12-01 LAB — COMPREHENSIVE METABOLIC PANEL
ALK PHOS: 77 U/L (ref 38–126)
ALT: 17 U/L (ref 0–44)
AST: 24 U/L (ref 15–41)
Albumin: 4 g/dL (ref 3.5–5.0)
Anion gap: 7 (ref 5–15)
BUN: 21 mg/dL (ref 8–23)
CO2: 30 mmol/L (ref 22–32)
CREATININE: 1.14 mg/dL — AB (ref 0.44–1.00)
Calcium: 9.1 mg/dL (ref 8.9–10.3)
Chloride: 100 mmol/L (ref 98–111)
GFR calc Af Amer: 50 mL/min — ABNORMAL LOW (ref >60)
GFR, EST NON AFRICAN AMERICAN: 44 mL/min — AB (ref >60)
GLUCOSE: 88 mg/dL (ref 70–99)
POTASSIUM: 3.1 mmol/L — AB (ref 3.5–5.1)
Sodium: 137 mmol/L (ref 135–145)
TOTAL PROTEIN: 7.4 g/dL (ref 6.5–8.1)
Total Bilirubin: 0.8 mg/dL (ref 0.3–1.2)

## 2017-12-01 LAB — LACTATE DEHYDROGENASE: LDH: 176 U/L (ref 98–192)

## 2017-12-01 LAB — TECHNOLOGIST SMEAR REVIEW: TECH REVIEW: NORMAL

## 2017-12-01 NOTE — Progress Notes (Signed)
Hematology/Oncology Consult note [facility] [phone] [phone]   Patient Care Team: Tracie Harrier, MD as PCP - General (Internal Medicine)  REFERRING PROVIDER: Dr. Ginette Pitman CHIEF COMPLAINTS/REASON FOR VISIT:  Evaluation of neck swelling, history of lymphoma.  HISTORY OF PRESENTING ILLNESS:  [name] is a  84 y.o.  female with PMH listed below who was referred to me for evaluation of neck swelling, history of lymphoma. Patient reports remote history of lymphoma many years ago.  She recalls that she received chemotherapy at that time.  Followed up with Dr. Jeb Levering who retired from our [place] center.  She was recently seen by PCP and has complained about her concerns of bilateral neck swelling for a while. She could not specify when she noticed this change. She reports feeling anxious as her initial symptoms which led to lymphoma diagnosis was neck LN swelling.   Otherwise she reports feeling well.  Denies weight loss, fever, chills, fatigue, night sweats. Appetite is good.   Review of Systems  Constitutional: Negative for chills, fever, malaise/fatigue and weight loss.  HENT: Negative for nosebleeds and sore throat.   Eyes: Negative for double vision, photophobia and redness.  Respiratory: Negative for cough, shortness of breath and wheezing.   Cardiovascular: Negative for chest pain, palpitations and orthopnea.  Gastrointestinal: Negative for abdominal pain, blood in stool, nausea and vomiting.  Genitourinary: Negative for dysuria.  Musculoskeletal: Negative for back pain, myalgias and neck pain.  Skin: Negative for itching and rash.  Neurological: Negative for dizziness, tingling and tremors.  Endo/Heme/Allergies: Negative for environmental allergies. Does not bruise/bleed easily.  Psychiatric/Behavioral: Negative for depression.    MEDICAL HISTORY:  Past Medical History:  Diagnosis Date  . Afib ([place])   . Anxiety   .  Anxiety   . Cancer ([place])    Lymphoma   . Congestive heart failure (CHF) ([place])   . Coronary artery disease    Status post stent placement in LAD  . GERD (gastroesophageal reflux disease)   . Hyperlipemia   . Thyroid disease     SURGICAL HISTORY: Past Surgical History:  Procedure Laterality Date  . ABDOMINAL HYSTERECTOMY     complete  . CORONARY STENT PLACEMENT    . VEIN SURGERY     Endovenous ablation of saphenous vein    SOCIAL HISTORY: Social History   Socioeconomic History  . Marital status: Single    Spouse name: Not on file  . Number of children: Not on file  . Years of education: Not on file  . Highest education level: Not on file  Occupational History  . Occupation: retired  Scientific laboratory technician  . Financial resource strain: Not on file  . Food insecurity:    Worry: Not on file    Inability: Not on file  . Transportation needs:    Medical: Not on file    Non-medical: Not on file  Tobacco Use  . Smoking status: Never Smoker  . Smokeless tobacco: Never Used  Substance and Sexual Activity  . Alcohol use: No  . Drug use: No  . Sexual activity: Not on file  Lifestyle  . Physical activity:    Days per week: Not on file    Minutes per session: Not on file  . Stress: Not on file  Relationships  . Social connections:    Talks on phone: Not on file    Gets together: Not on file    Attends religious service: Not on file    Active  member of club or organization: Not on file    Attends meetings of clubs or organizations: Not on file    Relationship status: Not on file  . Intimate partner violence:    Fear of current or ex partner: Not on file    Emotionally abused: Not on file    Physically abused: Not on file    Forced sexual activity: Not on file  Other Topics Concern  . Not on file  Social History Narrative   Lives at home independently. Has a cane to ambulate occasionally. Continues to drive.    FAMILY HISTORY: Family History  Problem Relation Age of  Onset  . Heart Problems Mother   . Heart Problems Father   . Heart attack Father   . Diabetes Brother   . Diabetes Brother     ALLERGIES:  is allergic to sulfa antibiotics.  MEDICATIONS:  Current Outpatient Medications  Medication Sig Dispense Refill  . allopurinol (ZYLOPRIM) 100 MG tablet TAKE 2 TABLETS (200 MG TOTAL) BY MOUTH ONCE DAILY.  1  . amoxicillin (AMOXIL) 875 MG tablet Take 875 mg by mouth 2 (two) times daily.  0  . aspirin EC 81 MG tablet Take 81 mg by mouth daily.    . cyclobenzaprine (FLEXERIL) 5 MG tablet TAKE 1 TABLET (5 MG TOTAL) BY MOUTH 2 (TWO) TIMES DAILY AS NEEDED FOR MUSCLE SPASMS    . famotidine (PEPCID) 20 MG tablet Take 20 mg by mouth 2 (two) times daily.  2  . fluticasone (FLONASE) 50 MCG/ACT nasal spray Place into the nose.    . furosemide (LASIX) 20 MG tablet Take 20 mg by mouth 2 (two) times daily.    Marland Kitchen gabapentin (NEURONTIN) 300 MG capsule Take 300 mg by mouth 2 (two) times daily.  2  . levothyroxine (SYNTHROID, LEVOTHROID) 25 MCG tablet TAKE 1 TABLET ONCE DAILY ON AN EMPTY STOMACH WITH A GLASS OF WATER 30-60 MIN BEFORE BREAKFAST  1  . lovastatin (MEVACOR) 40 MG tablet Take by mouth.    . nitroGLYCERIN (NITROSTAT) 0.4 MG SL tablet Place 0.4 mg under the tongue every 5 (five) minutes x 3 doses as needed. For chest pain    . oxyCODONE-acetaminophen (PERCOCET) 5-325 MG tablet Take 2 tablets by mouth every 6 (six) hours as needed. 12 tablet 0  . potassium chloride (K-DUR,KLOR-CON) 10 MEQ tablet Take 10 mEq by mouth 2 (two) times daily.    Marland Kitchen rOPINIRole (REQUIP) 0.5 MG tablet TAKE 1 TABLET (0.5 MG TOTAL) BY MOUTH NIGHTLY.  5  . rOPINIRole (REQUIP) 1 MG tablet Reported on 03/28/2015  5  . traMADol (ULTRAM) 50 MG tablet TAKE 1 TABLET BY MOUTH 2 TIMES A DAY AS NEEDED FOR PAIN  3  . allopurinol (ZYLOPRIM) 100 MG tablet Take 1 tablet by mouth.    . furosemide (LASIX) 40 MG tablet Take 1 tablet (40 mg total) by mouth daily. 30 tablet 1  . levothyroxine (SYNTHROID,  LEVOTHROID) 25 MCG tablet Take 1 tablet (25 mcg total) by mouth daily before breakfast. Take 30-60 minutes before breakfast, 30 tablet 5  . Omega-3 Fatty Acids (FISH OIL) 1000 MG CAPS Take 1,000 mg by mouth daily.    . pantoprazole (PROTONIX) 40 MG tablet Take 40 mg by mouth daily.    Marland Kitchen zolpidem (AMBIEN) 5 MG tablet Take 1 tablet (5 mg total) by mouth at bedtime as needed for sleep. (Patient not taking: Reported on 12/01/2017) 10 tablet 0   No current facility-administered medications for this visit.  PHYSICAL EXAMINATION: ECOG PERFORMANCE STATUS: 1 - Symptomatic but completely ambulatory Vitals:   12/01/17 1029  BP: (!) 115/57  Pulse: 97  Resp: 18  Temp: (!) 97.3 F (36.3 C)   Filed Weights   12/01/17 1029  Weight: 154 lb (69.9 kg)    Physical Exam  Constitutional: She is oriented to person, place, and time. No distress.  HENT:  Head: Normocephalic and atraumatic.  Mouth/Throat: Oropharynx is clear and moist.  Eyes: Pupils are equal, round, and reactive to light. EOM are normal. No scleral icterus.  Neck: Normal range of motion. Neck supple.  Bilateral neck soft tissue swelling. No discrete palpable lymph node.    Cardiovascular: Normal rate, regular rhythm and normal heart sounds.  Pulmonary/Chest: Effort normal. No respiratory distress. She has no wheezes.  Abdominal: Soft. Bowel sounds are normal. She exhibits no distension and no mass. There is no tenderness.  Musculoskeletal: Normal range of motion. She exhibits no edema or deformity.  Neurological: She is alert and oriented to person, place, and time. No cranial nerve deficit. Coordination normal.  Skin: Skin is warm and dry. No rash noted. No erythema.  Psychiatric: She has a normal mood and affect. Her behavior is normal. Thought content normal.     LABORATORY DATA:  I have reviewed the data as listed Lab Results  Component Value Date   WBC 6.8 12/01/2017   HGB 13.3 12/01/2017   HCT 38.9 12/01/2017   MCV  95.1 12/01/2017   PLT 258 12/01/2017   Recent Labs    12/01/17 1133  NA 137  K 3.1*  CL 100  CO2 30  GLUCOSE 88  BUN 21  CREATININE 1.14*  CALCIUM 9.1  GFRNONAA 44*  GFRAA 50*  PROT 7.4  ALBUMIN 4.0  AST 24  ALT 17  ALKPHOS 77  BILITOT 0.8     ASSESSMENT & PLAN:  1. History of lymphoma   2. Neck swelling   3. Hypokalemia    # will Obtain previous oncology history.  Recommend check US neck soft tissue.  Check cbc, LDH, uric acid, CMP, peripheral flowcytometry.  # Hypokalemia, K is 3.1 likely due to lasix use. Continue potassium supplements.  Patient is anxious and We spent sufficient time to discuss many aspect of care, questions were answered to patient's satisfaction.  Orders Placed This Encounter  Procedures  . US Soft Tissue Head/Neck    Standing Status:   Future    Standing Expiration Date:   12/01/2018    Order Specific Question:   Reason for Exam (SYMPTOM  OR DIAGNOSIS REQUIRED)    Answer:   neck swelling histroy of lymphoma    Order Specific Question:   Preferred imaging location?    Answer:   [facility]  . CBC with Differential/Platelet    Standing Status:   Future    Number of Occurrences:   1    Standing Expiration Date:   12/02/2018  . Comprehensive metabolic panel    Standing Status:   Future    Number of Occurrences:   1    Standing Expiration Date:   12/02/2018  . Lactate dehydrogenase    Standing Status:   Future    Number of Occurrences:   1    Standing Expiration Date:   12/02/2018  . Technologist smear review    Standing Status:   Future    Number of Occurrences:   1    Standing Expiration Date:   12/02/2018  . Uric acid  Standing Status:   Future    Number of Occurrences:   1    Standing Expiration Date:   12/02/2018  . Flow cytometry panel-leukemia/lymphoma work-up    Standing Status:   Future    Number of Occurrences:   1    Standing Expiration Date:   12/02/2018    All questions were answered. The patient knows to call the  clinic with any problems questions or concerns.  Return of visit: 10 days.  Thank you for this kind referral and the opportunity to participate in the care of this patient. A copy of today's note is routed to referring provider  Total face to face encounter time for this patient visit was 45 min. >50% of the time was  spent in counseling and coordination of care.    Earlie Server, MD, PhD Hematology Oncology [facility] Pager- [phone] 12/01/2017

## 2017-12-01 NOTE — Progress Notes (Signed)
Patient her for follow up. She is concerned because she has neck swelling and when she presses on it, it hurts.

## 2017-12-04 LAB — COMP PANEL: LEUKEMIA/LYMPHOMA: CLINICAL INFO: 7

## 2017-12-08 ENCOUNTER — Other Ambulatory Visit: Payer: Self-pay | Admitting: Oncology

## 2017-12-08 ENCOUNTER — Ambulatory Visit
Admission: RE | Admit: 2017-12-08 | Discharge: 2017-12-08 | Disposition: A | Discharge status: Home / Self Care | Payer: Medicare Other | Source: Ambulatory Visit | Attending: Oncology | Admitting: Oncology

## 2017-12-08 DIAGNOSIS — Z8579 Personal history of other malignant neoplasms of lymphoid, hematopoietic and related tissues: Secondary | ICD-10-CM

## 2017-12-08 DIAGNOSIS — R221 Localized swelling, mass and lump, neck: Secondary | ICD-10-CM | POA: Diagnosis present

## 2017-12-08 DIAGNOSIS — E041 Nontoxic single thyroid nodule: Secondary | ICD-10-CM | POA: Insufficient documentation

## 2017-12-11 ENCOUNTER — Other Ambulatory Visit: Payer: Self-pay

## 2017-12-11 ENCOUNTER — Inpatient Hospital Stay (HOSPITAL_BASED_OUTPATIENT_CLINIC_OR_DEPARTMENT_OTHER): Payer: Medicare Other | Admitting: Oncology

## 2017-12-11 ENCOUNTER — Encounter: Payer: Self-pay | Admitting: Oncology

## 2017-12-11 VITALS — BP 115/70 | HR 101 | Temp 98.1°F | Resp 18 | Wt 153.7 lb

## 2017-12-11 DIAGNOSIS — Z8572 Personal history of non-Hodgkin lymphomas: Secondary | ICD-10-CM

## 2017-12-11 DIAGNOSIS — E041 Nontoxic single thyroid nodule: Secondary | ICD-10-CM | POA: Diagnosis not present

## 2017-12-11 DIAGNOSIS — Z8579 Personal history of other malignant neoplasms of lymphoid, hematopoietic and related tissues: Secondary | ICD-10-CM

## 2017-12-11 DIAGNOSIS — Z79899 Other long term (current) drug therapy: Secondary | ICD-10-CM

## 2017-12-11 DIAGNOSIS — R221 Localized swelling, mass and lump, neck: Secondary | ICD-10-CM | POA: Diagnosis not present

## 2017-12-11 DIAGNOSIS — E876 Hypokalemia: Secondary | ICD-10-CM | POA: Diagnosis not present

## 2017-12-11 NOTE — Progress Notes (Signed)
Patient here for follow up

## 2017-12-11 NOTE — Progress Notes (Signed)
Hematology/Oncology follow up note [facility] [phone] Fax:([phone]   Patient Care Team: Tracie Harrier, MD as PCP - General (Internal Medicine)  REFERRING PROVIDER: Dr. Ginette Pitman CHIEF COMPLAINTS/REASON FOR VISIT:  Evaluation of neck swelling, history of lymphoma.  HISTORY OF PRESENTING ILLNESS:  [name] is a  84 y.o.  female with PMH listed below who was referred to me for evaluation of neck swelling, history of lymphoma. Patient reports remote history of lymphoma many years ago.  She recalls that she received chemotherapy at that time.  Followed up with Dr. Jeb Levering who retired from our [facility].  She was recently seen by PCP and has complained about her concerns of bilateral neck swelling for a while. She could not specify when she noticed this change. She reports feeling anxious as her initial symptoms which led to lymphoma diagnosis was neck LN swelling.   Otherwise she reports feeling well.  Denies weight loss, fever, chills, fatigue, night sweats. Appetite is good.   INTERVAL HISTORY [name] is a 84 y.o. female who has above history reviewed by me today presents for follow up visit for management of neck swelling, history of lymphoma.  I obtained patient's previous records in EMR by Dr.Choski. Patient has history of non hodgkin's lymphoma and s/p chemotherapy treatment. Her pathology was not available to me.  I obtained US neck soft tissue.  Patient got [place] thryoid done which showed left mid thyroid nodule, 2.2 cm x 2.1cm x 2.1cm.  Patient presents to discuss work up results and further management plan.    Review of Systems  Constitutional: Negative for chills, fever, malaise/fatigue and weight loss.  HENT: Negative for nosebleeds and sore throat.        Neck swelling.   Eyes: Negative for double vision, photophobia and redness.  Respiratory: Negative for cough, shortness of breath and wheezing.   Cardiovascular:  Negative for chest pain, palpitations and orthopnea.  Gastrointestinal: Negative for abdominal pain, blood in stool, nausea and vomiting.  Genitourinary: Negative for dysuria.  Musculoskeletal: Negative for back pain, myalgias and neck pain.  Skin: Negative for itching and rash.  Neurological: Negative for dizziness, tingling and tremors.  Endo/Heme/Allergies: Negative for environmental allergies. Does not bruise/bleed easily.  Psychiatric/Behavioral: Negative for depression.    MEDICAL HISTORY:  Past Medical History:  Diagnosis Date  . Afib ([place])   . Anxiety   . Anxiety   . Cancer ([place])    Lymphoma   . Congestive heart failure (CHF) ([place])   . Coronary artery disease    Status post stent placement in LAD  . GERD (gastroesophageal reflux disease)   . Hyperlipemia   . Thyroid disease     SURGICAL HISTORY: Past Surgical History:  Procedure Laterality Date  . ABDOMINAL HYSTERECTOMY     complete  . CORONARY STENT PLACEMENT    . VEIN SURGERY     Endovenous ablation of saphenous vein    SOCIAL HISTORY: Social History   Socioeconomic History  . Marital status: Single    Spouse name: Not on file  . Number of children: Not on file  . Years of education: Not on file  . Highest education level: Not on file  Occupational History  . Occupation: retired  Scientific laboratory technician  . Financial resource strain: Not on file  . Food insecurity:    Worry: Not on file    Inability: Not on file  . Transportation needs:    Medical: Not on file  Non-medical: Not on file  Tobacco Use  . Smoking status: Never Smoker  . Smokeless tobacco: Never Used  Substance and Sexual Activity  . Alcohol use: No  . Drug use: No  . Sexual activity: Not on file  Lifestyle  . Physical activity:    Days per week: Not on file    Minutes per session: Not on file  . Stress: Not on file  Relationships  . Social connections:    Talks on phone: Not on file    Gets together: Not on file    Attends religious  service: Not on file    Active member of club or organization: Not on file    Attends meetings of clubs or organizations: Not on file    Relationship status: Not on file  . Intimate partner violence:    Fear of current or ex partner: Not on file    Emotionally abused: Not on file    Physically abused: Not on file    Forced sexual activity: Not on file  Other Topics Concern  . Not on file  Social History Narrative   Lives at home independently. Has a cane to ambulate occasionally. Continues to drive.    FAMILY HISTORY: Family History  Problem Relation Age of Onset  . Heart Problems Mother   . Heart Problems Father   . Heart attack Father   . Diabetes Brother   . Diabetes Brother     ALLERGIES:  is allergic to sulfa antibiotics.  MEDICATIONS:  Current Outpatient Medications  Medication Sig Dispense Refill  . allopurinol (ZYLOPRIM) 100 MG tablet TAKE 2 TABLETS (200 MG TOTAL) BY MOUTH ONCE DAILY.  1  . aspirin EC 81 MG tablet Take 81 mg by mouth daily.    . cyclobenzaprine (FLEXERIL) 5 MG tablet TAKE 1 TABLET (5 MG TOTAL) BY MOUTH 2 (TWO) TIMES DAILY AS NEEDED FOR MUSCLE SPASMS    . famotidine (PEPCID) 20 MG tablet Take 20 mg by mouth 2 (two) times daily.  2  . fluticasone (FLONASE) 50 MCG/ACT nasal spray Place into the nose.    . furosemide (LASIX) 20 MG tablet Take 20 mg by mouth 2 (two) times daily.    Marland Kitchen gabapentin (NEURONTIN) 300 MG capsule Take 300 mg by mouth 2 (two) times daily.  2  . levothyroxine (SYNTHROID, LEVOTHROID) 25 MCG tablet TAKE 1 TABLET ONCE DAILY ON AN EMPTY STOMACH WITH A GLASS OF WATER 30-60 MIN BEFORE BREAKFAST  1  . nitroGLYCERIN (NITROSTAT) 0.4 MG SL tablet Place 0.4 mg under the tongue every 5 (five) minutes x 3 doses as needed. For chest pain    . oxyCODONE-acetaminophen (PERCOCET) 5-325 MG tablet Take 2 tablets by mouth every 6 (six) hours as needed. 12 tablet 0  . pantoprazole (PROTONIX) 40 MG tablet Take 40 mg by mouth daily.    . potassium chloride  (K-DUR,KLOR-CON) 10 MEQ tablet Take 10 mEq by mouth 2 (two) times daily.    Marland Kitchen rOPINIRole (REQUIP) 0.5 MG tablet TAKE 1 TABLET (0.5 MG TOTAL) BY MOUTH NIGHTLY.  5  . rOPINIRole (REQUIP) 1 MG tablet Reported on 03/28/2015  5  . traMADol (ULTRAM) 50 MG tablet TAKE 1 TABLET BY MOUTH 2 TIMES A DAY AS NEEDED FOR PAIN  3  . zolpidem (AMBIEN) 5 MG tablet Take 1 tablet (5 mg total) by mouth at bedtime as needed for sleep. 10 tablet 0  . allopurinol (ZYLOPRIM) 100 MG tablet Take 1 tablet by mouth.    Marland Kitchen  amoxicillin (AMOXIL) 875 MG tablet Take 875 mg by mouth 2 (two) times daily.  0  . furosemide (LASIX) 40 MG tablet Take 1 tablet (40 mg total) by mouth daily. 30 tablet 1  . levothyroxine (SYNTHROID, LEVOTHROID) 25 MCG tablet Take 1 tablet (25 mcg total) by mouth daily before breakfast. Take 30-60 minutes before breakfast, 30 tablet 5  . lovastatin (MEVACOR) 40 MG tablet Take by mouth.    . Omega-3 Fatty Acids (FISH OIL) 1000 MG CAPS Take 1,000 mg by mouth daily.     No current facility-administered medications for this visit.      PHYSICAL EXAMINATION: ECOG PERFORMANCE STATUS: 1 - Symptomatic but completely ambulatory Vitals:   12/11/17 1400  BP: 115/70  Pulse: (!) 101  Resp: 18  Temp: 98.1 F (36.7 C)   Filed Weights   12/11/17 1400  Weight: 153 lb 11.2 oz (69.7 kg)    Physical Exam  Constitutional: She is oriented to person, place, and time. No distress.  HENT:  Head: Normocephalic and atraumatic.  Mouth/Throat: Oropharynx is clear and moist.  Eyes: Pupils are equal, round, and reactive to light. EOM are normal. No scleral icterus.  Neck: Normal range of motion. Neck supple.  No discrete palpable lymph node.    Cardiovascular: Normal rate, regular rhythm and normal heart sounds.  Pulmonary/Chest: Effort normal. No respiratory distress. She has no wheezes.  Abdominal: Soft. Bowel sounds are normal. She exhibits no distension and no mass. There is no tenderness.  Musculoskeletal:  Normal range of motion. She exhibits no edema or deformity.  Neurological: She is alert and oriented to person, place, and time. No cranial nerve deficit. Coordination normal.  Skin: Skin is warm and dry. No rash noted. No erythema.  Psychiatric: She has a normal mood and affect. Her behavior is normal. Thought content normal.     LABORATORY DATA:  I have reviewed the data as listed Lab Results  Component Value Date   WBC 6.8 12/01/2017   HGB 13.3 12/01/2017   HCT 38.9 12/01/2017   MCV 95.1 12/01/2017   PLT 258 12/01/2017   Recent Labs    12/01/17 1133  NA 137  K 3.1*  CL 100  CO2 30  GLUCOSE 88  BUN 21  CREATININE 1.14*  CALCIUM 9.1  GFRNONAA 44*  GFRAA 50*  PROT 7.4  ALBUMIN 4.0  AST 24  ALT 17  ALKPHOS 77  BILITOT 0.8   RADIOGRAPHIC STUDIES: I have personally reviewed the radiological images as listed and agreed with the findings in the report. 12/08/2017 US thyroid Left mid thyroid nodule (labeled 1) meets criteria for biopsy, as designated by the newly established ACR TI-RADS criteria, and referral for biopsy is recommended.   ASSESSMENT & PLAN:  1. Thyroid nodule   2. Neck swelling   3. History of lymphoma    # Discussed with patient that I reviewed limited previous records. Dr.Choski's note mentioned that patient has history of non hodgkin's lymphoma  Labs reviewed, negative peripheral blood flowcytometry, normal LDH, she does not have constitutional symptoms.  Clinically she is remission.  I obtained US neck soft tissue and patient got US thyroid done. I was not notified. So I called Radiology and spoke with Maudie Mercury who explained to me why her  which covers thyroid and bilateral neck. Patient reports neck swelling/throat discomfort,and [place] technician found thyroid nodule which may causes her symptoms. US thyroid was done instead but bilateral neck soft tissue was also checked.  No adenopathy was  discovered. Finding was also discussed with radiologist Dr.Wagner.   Proceed with US guided thyroid nodule biopsy.    Orders Placed This Encounter  Procedures  . US Soft Tissue Head/Neck    Standing Status:   Future    Standing Expiration Date:   12/11/2018    Order Specific Question:   Reason for Exam (SYMPTOM  OR DIAGNOSIS REQUIRED)    Answer:   neck swelling    Order Specific Question:   Preferred imaging location?    Answer:    [facility]    Standing Status:   Future    Standing Expiration Date:   12/12/2018    All questions were answered. The patient knows to call the clinic with any problems questions or concerns.  Return of visit: 10 days after US guided thyroid biopsy.    Earlie Server, MD, PhD Hematology Oncology [facility] at [facility] Pager- [phone] 12/11/2017

## 2017-12-17 NOTE — Addendum Note (Signed)
Addended by: Earlie Server on: 12/17/2017 11:05 PM   Modules accepted: Orders

## 2017-12-19 ENCOUNTER — Ambulatory Visit: Payer: Medicare Other

## 2017-12-30 ENCOUNTER — Ambulatory Visit
Admission: RE | Admit: 2017-12-30 | Discharge: 2017-12-30 | Disposition: A | Discharge status: Home / Self Care | Payer: Medicare Other | Source: Ambulatory Visit | Attending: Oncology | Admitting: Oncology

## 2017-12-30 DIAGNOSIS — E041 Nontoxic single thyroid nodule: Secondary | ICD-10-CM | POA: Diagnosis not present

## 2018-01-06 ENCOUNTER — Telehealth: Payer: Self-pay | Admitting: *Deleted

## 2018-01-06 NOTE — Telephone Encounter (Signed)
Can you please have [name] move her appointment up

## 2018-01-06 NOTE — Telephone Encounter (Addendum)
Patient called wanting results from biopsy. She does not have a follow up appointment until 01/14/18  Component 7d ago  CYTOLOGY - NON GYN Cytology - Non PAP  CASE: [id number]  PATIENT: [name]  Non-Gyn Cytology Report      SPECIMEN SUBMITTED:  A. Thyroid nodule, left mid;   CLINICAL HISTORY:  84 year old female with history of lymphoma; Left ;mid thyroid nodule   PRE-OPERATIVE DIAGNOSIS:  Left mid thyroid nodule measuring 2.2 cm; TR4   POST-OPERATIVE DIAGNOSIS:  Same as above      DIAGNOSIS:  A. THYROID NODULE, LEFT MID; ULTRASOUND-GUIDED FNA:  - FOLLICULAR NEOPLASM, HURTHLE CELL TYPE (BETHESDA 4).   Comment:  Material will be sent for Afirma testing and the results will be  reported in an addendum.   GROSS DESCRIPTION:  A. Site: Left mid thyroid nodule  Procedure: FNA  Cytotechnologist: Ashlee Howze-Soremekun  Specimen collected:  4 diff Quik stained slides  4 Pap stained slides  Material for Afirma if needed, labeled: [id number]  Specimen labeled: FNA left mid thyroid            Description: Pink CytoLyt solution            Submitted for: ThinPrep    Final Diagnosis performed by Quay Burow, MD.  Electronically signed  12/31/2017 4:29:07PM  The electronic signature indicates that the named Attending Pathologist  has evaluated the specimen  Technical component performed at [facility], [address] [place],  [place] [address] Lab: [phone] Dir: Rush Farmer, MD, MMM Professional  component performed at [facility], [facility], [place], [place], [place] [address] Lab: [phone] Dir: Dellia Nims.  Reuel Derby, MD

## 2018-01-06 NOTE — Telephone Encounter (Signed)
Called pt, pt was wanting to know if she could get results over the phone, advised her that we discuss those results during an office visit, offered to give her an appt sooner that 01/14/18, she declined a soon appt and requested to keep appt as is.

## 2018-01-06 NOTE — Telephone Encounter (Signed)
She is scheduled to see Dr Tasia Catchings on 01/14/18, next week.

## 2018-01-06 NOTE — Telephone Encounter (Signed)
If she wants to be seen sooner we can

## 2018-01-14 ENCOUNTER — Encounter: Payer: Self-pay | Admitting: Oncology

## 2018-01-14 ENCOUNTER — Ambulatory Visit: Payer: Medicare Other | Admitting: Oncology

## 2018-01-14 LAB — CYTOLOGY - NON PAP

## 2018-01-16 ENCOUNTER — Other Ambulatory Visit: Payer: Self-pay

## 2018-01-16 ENCOUNTER — Inpatient Hospital Stay: Payer: Medicare Other | Attending: Oncology | Admitting: Oncology

## 2018-01-16 ENCOUNTER — Encounter: Payer: Self-pay | Admitting: Oncology

## 2018-01-16 VITALS — BP 120/77 | HR 94 | Temp 97.6°F | Resp 18 | Wt 153.3 lb

## 2018-01-16 DIAGNOSIS — E041 Nontoxic single thyroid nodule: Secondary | ICD-10-CM | POA: Insufficient documentation

## 2018-01-16 DIAGNOSIS — Z9221 Personal history of antineoplastic chemotherapy: Secondary | ICD-10-CM | POA: Diagnosis not present

## 2018-01-16 DIAGNOSIS — Z8572 Personal history of non-Hodgkin lymphomas: Secondary | ICD-10-CM | POA: Insufficient documentation

## 2018-01-16 DIAGNOSIS — Z8579 Personal history of other malignant neoplasms of lymphoid, hematopoietic and related tissues: Secondary | ICD-10-CM

## 2018-01-16 NOTE — Progress Notes (Signed)
Patient here for follow up. Had a fall 2 week ago and fractured right forearm.

## 2018-01-18 NOTE — Progress Notes (Signed)
Hematology/Oncology follow up note [facility] Telephone:(336) [phone] Fax:(336) [phone]   Patient Care Team: Tracie Harrier, MD as PCP - General (Internal Medicine)  REFERRING PROVIDER: Dr. Ginette Pitman CHIEF COMPLAINTS/REASON FOR VISIT:  Evaluation of neck swelling, history of lymphoma.  HISTORY OF PRESENTING ILLNESS:  [name] is a  84 y.o.  female with PMH listed below who was referred to me for evaluation of neck swelling, history of lymphoma. Patient reports remote history of lymphoma many years ago.  She recalls that she received chemotherapy at that time.  Followed up with Dr. Jeb Levering who retired from our [place] center.  She was recently seen by PCP and has complained about her concerns of bilateral neck swelling for a while. She could not specify when she noticed this change. She reports feeling anxious as her initial symptoms which led to lymphoma diagnosis was neck LN swelling.   obtained patient's previous records in EMR by Dr.Choski. Patient has history of non hodgkin's lymphoma and s/p chemotherapy treatment. Her pathology was not available to me.   [place] thryoid done which showed left mid thyroid nodule, 2.2 cm x 2.1cm x 2.1cm.   INTERVAL HISTORY [name] is a 84 y.o. female who has above history reviewed by me today presents for follow up visit for management of thyroid nodule During the interval, patient has had left thyoid nodule biopsy.  She presents to discuss results.  No new complaints.     Review of Systems  Constitutional: Negative for chills, fever, malaise/fatigue and weight loss.  HENT: Negative for nosebleeds and sore throat.        Neck swelling.   Eyes: Negative for double vision, photophobia and redness.  Respiratory: Negative for cough, shortness of breath and wheezing.   Cardiovascular: Negative for chest pain, palpitations, orthopnea and leg swelling.  Gastrointestinal: Negative for abdominal pain, blood in stool,  nausea and vomiting.  Genitourinary: Negative for dysuria.  Musculoskeletal: Negative for back pain, myalgias and neck pain.  Skin: Negative for itching and rash.  Neurological: Negative for dizziness, tingling and tremors.  Endo/Heme/Allergies: Negative for environmental allergies. Does not bruise/bleed easily.  Psychiatric/Behavioral: Negative for depression and hallucinations.    MEDICAL HISTORY:  Past Medical History:  Diagnosis Date  . Afib ([place])   . Anxiety   . Anxiety   . Cancer ([place])    Lymphoma   . Congestive heart failure (CHF) ([place])   . Coronary artery disease    Status post stent placement in LAD  . GERD (gastroesophageal reflux disease)   . Hyperlipemia   . Thyroid disease     SURGICAL HISTORY: Past Surgical History:  Procedure Laterality Date  . ABDOMINAL HYSTERECTOMY     complete  . CORONARY STENT PLACEMENT    . VEIN SURGERY     Endovenous ablation of saphenous vein    SOCIAL HISTORY: Social History   Socioeconomic History  . Marital status: Single    Spouse name: Not on file  . Number of children: Not on file  . Years of education: Not on file  . Highest education level: Not on file  Occupational History  . Occupation: retired  Scientific laboratory technician  . Financial resource strain: Not on file  . Food insecurity:    Worry: Not on file    Inability: Not on file  . Transportation needs:    Medical: Not on file    Non-medical: Not on file  Tobacco Use  . Smoking status: Never Smoker  .  Smokeless tobacco: Never Used  Substance and Sexual Activity  . Alcohol use: No  . Drug use: No  . Sexual activity: Not on file  Lifestyle  . Physical activity:    Days per week: Not on file    Minutes per session: Not on file  . Stress: Not on file  Relationships  . Social connections:    Talks on phone: Not on file    Gets together: Not on file    Attends religious service: Not on file    Active member of club or organization: Not on file    Attends meetings of  clubs or organizations: Not on file    Relationship status: Not on file  . Intimate partner violence:    Fear of current or ex partner: Not on file    Emotionally abused: Not on file    Physically abused: Not on file    Forced sexual activity: Not on file  Other Topics Concern  . Not on file  Social History Narrative   Lives at home independently. Has a cane to ambulate occasionally. Continues to drive.    FAMILY HISTORY: Family History  Problem Relation Age of Onset  . Heart Problems Mother   . Heart Problems Father   . Heart attack Father   . Diabetes Brother   . Diabetes Brother     ALLERGIES:  is allergic to sulfa antibiotics.  MEDICATIONS:  Current Outpatient Medications  Medication Sig Dispense Refill  . allopurinol (ZYLOPRIM) 100 MG tablet TAKE 2 TABLETS (200 MG TOTAL) BY MOUTH ONCE DAILY.  1  . cyclobenzaprine (FLEXERIL) 5 MG tablet TAKE 1 TABLET (5 MG TOTAL) BY MOUTH 2 (TWO) TIMES DAILY AS NEEDED FOR MUSCLE SPASMS    . famotidine (PEPCID) 20 MG tablet Take 20 mg by mouth 2 (two) times daily.  2  . fluticasone (FLONASE) 50 MCG/ACT nasal spray Place into the nose.    . furosemide (LASIX) 20 MG tablet Take 20 mg by mouth 2 (two) times daily.    Marland Kitchen levothyroxine (SYNTHROID, LEVOTHROID) 25 MCG tablet TAKE 1 TABLET ONCE DAILY ON AN EMPTY STOMACH WITH A GLASS OF WATER 30-60 MIN BEFORE BREAKFAST  1  . lovastatin (MEVACOR) 40 MG tablet Take by mouth.    . nitroGLYCERIN (NITROSTAT) 0.4 MG SL tablet Place 0.4 mg under the tongue every 5 (five) minutes x 3 doses as needed. For chest pain    . Omega-3 Fatty Acids (FISH OIL) 1000 MG CAPS Take 1,000 mg by mouth daily.    . ondansetron (ZOFRAN-ODT) 4 MG disintegrating tablet Take 4 mg by mouth every 8 (eight) hours as needed. for nausea  0  . oxyCODONE-acetaminophen (PERCOCET) 5-325 MG tablet Take 2 tablets by mouth every 6 (six) hours as needed. 12 tablet 0  . pantoprazole (PROTONIX) 40 MG tablet Take 40 mg by mouth daily.    .  potassium chloride (K-DUR,KLOR-CON) 10 MEQ tablet Take 10 mEq by mouth 2 (two) times daily.    Marland Kitchen rOPINIRole (REQUIP) 0.5 MG tablet TAKE 1 TABLET (0.5 MG TOTAL) BY MOUTH NIGHTLY.  5  . rOPINIRole (REQUIP) 1 MG tablet Reported on 03/28/2015  5  . traMADol (ULTRAM) 50 MG tablet TAKE 1 TABLET BY MOUTH 2 TIMES A DAY AS NEEDED FOR PAIN  3  . allopurinol (ZYLOPRIM) 100 MG tablet Take 1 tablet by mouth.    Marland Kitchen amoxicillin (AMOXIL) 875 MG tablet Take 875 mg by mouth 2 (two) times daily.  0  . aspirin  EC 81 MG tablet Take 81 mg by mouth daily.    . furosemide (LASIX) 40 MG tablet Take 1 tablet (40 mg total) by mouth daily. 30 tablet 1  . gabapentin (NEURONTIN) 300 MG capsule Take 300 mg by mouth 2 (two) times daily.  2  . levothyroxine (SYNTHROID, LEVOTHROID) 25 MCG tablet Take 1 tablet (25 mcg total) by mouth daily before breakfast. Take 30-60 minutes before breakfast, 30 tablet 5  . zolpidem (AMBIEN) 5 MG tablet Take 1 tablet (5 mg total) by mouth at bedtime as needed for sleep. (Patient not taking: Reported on 01/16/2018) 10 tablet 0   No current facility-administered medications for this visit.      PHYSICAL EXAMINATION: ECOG PERFORMANCE STATUS: 1 - Symptomatic but completely ambulatory Vitals:   01/16/18 1505  BP: 120/77  Pulse: 94  Resp: 18  Temp: 97.6 F (36.4 C)   Filed Weights   01/16/18 1505  Weight: 153 lb 4.8 oz (69.5 kg)    Physical Exam  Constitutional: She is oriented to person, place, and time. No distress.  HENT:  Head: Normocephalic and atraumatic.  Mouth/Throat: Oropharynx is clear and moist.  Eyes: Pupils are equal, round, and reactive to light. EOM are normal. No scleral icterus.  Neck: Normal range of motion. Neck supple.  No discrete palpable lymph node.    Cardiovascular: Normal rate, regular rhythm and normal heart sounds.  Pulmonary/Chest: Effort normal. No respiratory distress. She has no wheezes.  Abdominal: Soft. Bowel sounds are normal. She exhibits no  distension and no mass. There is no tenderness.  Musculoskeletal: Normal range of motion. She exhibits no edema or deformity.  Neurological: She is alert and oriented to person, place, and time. No cranial nerve deficit. Coordination normal.  Skin: Skin is warm and dry. No rash noted. No erythema.  Psychiatric: She has a normal mood and affect. Her behavior is normal. Thought content normal.     LABORATORY DATA:  I have reviewed the data as listed Lab Results  Component Value Date   WBC 6.8 12/01/2017   HGB 13.3 12/01/2017   HCT 38.9 12/01/2017   MCV 95.1 12/01/2017   PLT 258 12/01/2017   Recent Labs    12/01/17 1133  NA 137  K 3.1*  CL 100  CO2 30  GLUCOSE 88  BUN 21  CREATININE 1.14*  CALCIUM 9.1  GFRNONAA 44*  GFRAA 50*  PROT 7.4  ALBUMIN 4.0  AST 24  ALT 17  ALKPHOS 77  BILITOT 0.8   RADIOGRAPHIC STUDIES: I have personally reviewed the radiological images as listed and agreed with the findings in the report. 12/08/2017 US thyroid Left mid thyroid nodule (labeled 1) meets criteria for biopsy, as designated by the newly established ACR TI-RADS criteria, and referral for biopsy is recommended.   ASSESSMENT & PLAN:  1. Thyroid nodule   2. History of lymphoma    Biopsy pathology was discussed with patient.  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 Afirma GSC benign, ROM 4%.  Most likely benign.  I suggest patient to establish care with endocrinology for a second opinion and further monitoring. Refer to endocrinology.   History of lymphoma. Follow up in 6 months.  Orders Placed This Encounter  Procedures  . CBC with Differential/Platelet    Standing Status:   Future    Standing Expiration Date:   01/17/2019  . Comprehensive metabolic panel    Standing Status:   Future    Standing Expiration Date:   01/17/2019  .  Lactate dehydrogenase    Standing Status:   Future    Standing Expiration Date:   01/17/2019  . Ambulatory referral to  Endocrinology    Referral Priority:   Routine    Referral Type:   Consultation    Referral Reason:   Specialty Services Required    Number of Visits Requested:   1    All questions were answered. The patient knows to call the clinic with any problems questions or concerns. Total face to face encounter time for this patient visit was 15 min. >50% of the time was  spent in counseling and coordination of care.  Return of visit:  Earlie Server, MD, PhD Hematology Oncology [facility] at [facility] Pager- [phone] 01/18/2018

## 2018-01-19 ENCOUNTER — Telehealth: Payer: Self-pay | Admitting: *Deleted

## 2018-01-19 NOTE — Telephone Encounter (Signed)
Patient called reporting that she has chest congestion and is not feeling well. She is asking for medicine for it. Denies fever, but does not have a thermometer and reports  feeling cold then hot, and achy. Please advise

## 2018-01-19 NOTE — Telephone Encounter (Signed)
Per Dr Tasia Catchings, patient needs to contact PCP

## 2018-01-19 NOTE — Telephone Encounter (Signed)
Call returned to patient and advised to contact her PCP

## 2018-03-26 ENCOUNTER — Other Ambulatory Visit: Payer: Self-pay | Admitting: Internal Medicine

## 2018-03-26 DIAGNOSIS — R140 Abdominal distension (gaseous): Secondary | ICD-10-CM

## 2018-04-03 ENCOUNTER — Ambulatory Visit
Admission: RE | Admit: 2018-04-03 | Discharge: 2018-04-03 | Disposition: A | Discharge status: Home / Self Care | Payer: Medicare Other | Source: Ambulatory Visit | Attending: Internal Medicine | Admitting: Internal Medicine

## 2018-04-03 DIAGNOSIS — K5730 Diverticulosis of large intestine without perforation or abscess without bleeding: Secondary | ICD-10-CM | POA: Diagnosis not present

## 2018-04-03 DIAGNOSIS — K439 Ventral hernia without obstruction or gangrene: Secondary | ICD-10-CM | POA: Insufficient documentation

## 2018-04-03 DIAGNOSIS — I700 Atherosclerosis of aorta: Secondary | ICD-10-CM | POA: Insufficient documentation

## 2018-04-03 DIAGNOSIS — D171 Benign lipomatous neoplasm of skin and subcutaneous tissue of trunk: Secondary | ICD-10-CM | POA: Diagnosis not present

## 2018-04-03 DIAGNOSIS — K449 Diaphragmatic hernia without obstruction or gangrene: Secondary | ICD-10-CM | POA: Insufficient documentation

## 2018-04-03 DIAGNOSIS — R140 Abdominal distension (gaseous): Secondary | ICD-10-CM | POA: Diagnosis present

## 2018-04-17 ENCOUNTER — Ambulatory Visit: Payer: Self-pay | Admitting: General Surgery
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
## 2018-04-24 ENCOUNTER — Other Ambulatory Visit: Payer: Medicare Other

## 2018-04-28 ENCOUNTER — Other Ambulatory Visit: Payer: Self-pay

## 2018-04-28 ENCOUNTER — Encounter
Admission: RE | Admit: 2018-04-28 | Discharge: 2018-04-28 | Disposition: A | Discharge status: Home / Self Care | Payer: Medicare Other | Source: Ambulatory Visit | Attending: General Surgery | Admitting: General Surgery

## 2018-04-28 DIAGNOSIS — Z01812 Encounter for preprocedural laboratory examination: Secondary | ICD-10-CM | POA: Insufficient documentation

## 2018-04-28 HISTORY — DX: Hypothyroidism, unspecified: E03.9

## 2018-04-28 HISTORY — DX: Bronchitis, not specified as acute or chronic: J40

## 2018-04-28 HISTORY — DX: Cardiac arrhythmia, unspecified: I49.9

## 2018-04-28 HISTORY — DX: Essential (primary) hypertension: I10

## 2018-04-28 HISTORY — DX: Chronic kidney disease, unspecified: N18.9

## 2018-04-28 LAB — POTASSIUM: POTASSIUM: 3.9 mmol/L (ref 3.5–5.1)

## 2018-04-28 NOTE — Pre-Procedure Instructions (Signed)
CALLED AND FAXED CLEARANCE REQUEST RE RECENT BRONCHITIS TO DR Sarajane Jews. MESSAGED DR Peyton Najjar DIAZ.PATIENT AWARE

## 2018-04-28 NOTE — Patient Instructions (Signed)
Your procedure is scheduled on:05/08/18 Report to Day Surgery. [facility]OND FLOOR To find out your arrival time please call [phone] between 1PM - 3PM on  05/07/18.  Remember: Instructions that are not followed completely may result in serious medical risk,  up to and including death, or upon the discretion of your surgeon and anesthesiologist your  surgery may need to be rescheduled.     _X__ 1. Do not eat food after midnight the night before your procedure.                 No gum chewing or hard candies. You may drink clear liquids up to 2 hours                 before you are scheduled to arrive for your surgery- DO not drink clear                 liquids within 2 hours of the start of your surgery.                 Clear Liquids include:  water, apple juice without pulp, clear carbohydrate                 drink such as Clearfast of Gatorade, Black Coffee or Tea (Do not add                 anything to coffee or tea).  __X__2.  On the morning of surgery brush your teeth with toothpaste and water, you                may rinse your mouth with mouthwash if you wish.  Do not swallow any toothpaste of mouthwash.     _X__ 3.  No Alcohol for 24 hours before or after surgery.   _X__ 4.  Do Not Smoke or use e-cigarettes For 24 Hours Prior to Your Surgery.                 Do not use any chewable tobacco products for at least 6 hours prior to                 surgery.  ____  5.  Bring all medications with you on the day of surgery if instructed.   ____  6.  Notify your doctor if there is any change in your medical condition      (cold, fever, infections).     Do not wear jewelry, make-up, hairpins, clips or nail polish. Do not wear lotions, powders, or perfumes. You may wear deodorant. Do not shave 48 hours prior to surgery. Men may shave face and neck. Do not bring valuables to the hospital.    [facility] is not responsible for any belongings or  valuables.  Contacts, dentures or bridgework may not be worn into surgery. Leave your suitcase in the car. After surgery it may be brought to your room. For patients admitted to the hospital, discharge time is determined by your treatment team.   Patients discharged the day of surgery will not be allowed to drive home.   Please read over the following fact sheets that you were given:   Surgical Site Infection Prevention          __X__ Take these medicines the morning of surgery with A SIP OF WATER:    1. ALLOPURINOL  2. PEPCID  3. LEVOTHYROXINE  4. ROPINIROLE  5.  6.  ____ Fleet Enema (as directed)  __X__ Use CHG Soap as directed  _X___ Use inhalers on the day of surgery  AND BRING  ____ Stop metformin 2 days prior to surgery    ____ Take 1/2 of usual insulin dose the night before surgery. No insulin the morning          of surgery.   _X___ Stop Coumadin/Plavix/aspirin on    ALREADY STOPPED ASPIRIN  ____ Stop Anti-inflammatories on    ____ Stop supplements until after surgery.    ____ Bring C-Pap to the hospital.

## 2018-04-30 NOTE — Pre-Procedure Instructions (Signed)
Optimized from pcp on chart

## 2018-05-07 MED ORDER — CEFAZOLIN SODIUM-DEXTROSE 2-4 GM/100ML-% IV SOLN
2.0000 g | INTRAVENOUS | Status: AC
  Administered 2018-05-08: 2 g via INTRAVENOUS

## 2018-05-08 ENCOUNTER — Other Ambulatory Visit: Payer: Self-pay

## 2018-05-08 ENCOUNTER — Encounter
Admission: RE | Disposition: A | Discharge status: Home / Self Care | Payer: Self-pay | Source: Ambulatory Visit | Attending: General Surgery

## 2018-05-08 ENCOUNTER — Encounter: Payer: Self-pay | Admitting: *Deleted

## 2018-05-08 ENCOUNTER — Ambulatory Visit: Payer: Medicare Other

## 2018-05-08 ENCOUNTER — Ambulatory Visit: Payer: Medicare Other | Admitting: Anesthesiology

## 2018-05-08 ENCOUNTER — Observation Stay
Admission: RE | Admit: 2018-05-08 | Discharge: 2018-05-09 | Disposition: A | Discharge status: Home / Self Care | Payer: Medicare Other | Source: Ambulatory Visit | Attending: General Surgery | Admitting: General Surgery

## 2018-05-08 DIAGNOSIS — I2510 Atherosclerotic heart disease of native coronary artery without angina pectoris: Secondary | ICD-10-CM | POA: Diagnosis not present

## 2018-05-08 DIAGNOSIS — K219 Gastro-esophageal reflux disease without esophagitis: Secondary | ICD-10-CM | POA: Diagnosis not present

## 2018-05-08 DIAGNOSIS — Z90710 Acquired absence of both cervix and uterus: Secondary | ICD-10-CM | POA: Diagnosis not present

## 2018-05-08 DIAGNOSIS — C8590 Non-Hodgkin lymphoma, unspecified, unspecified site: Secondary | ICD-10-CM | POA: Diagnosis not present

## 2018-05-08 DIAGNOSIS — J849 Interstitial pulmonary disease, unspecified: Secondary | ICD-10-CM | POA: Insufficient documentation

## 2018-05-08 DIAGNOSIS — E785 Hyperlipidemia, unspecified: Secondary | ICD-10-CM | POA: Diagnosis not present

## 2018-05-08 DIAGNOSIS — G5602 Carpal tunnel syndrome, left upper limb: Secondary | ICD-10-CM | POA: Diagnosis not present

## 2018-05-08 DIAGNOSIS — Z955 Presence of coronary angioplasty implant and graft: Secondary | ICD-10-CM | POA: Diagnosis not present

## 2018-05-08 DIAGNOSIS — M48061 Spinal stenosis, lumbar region without neurogenic claudication: Secondary | ICD-10-CM | POA: Diagnosis not present

## 2018-05-08 DIAGNOSIS — I130 Hypertensive heart and chronic kidney disease with heart failure and stage 1 through stage 4 chronic kidney disease, or unspecified chronic kidney disease: Secondary | ICD-10-CM | POA: Insufficient documentation

## 2018-05-08 DIAGNOSIS — I482 Chronic atrial fibrillation, unspecified: Secondary | ICD-10-CM | POA: Diagnosis not present

## 2018-05-08 DIAGNOSIS — I080 Rheumatic disorders of both mitral and aortic valves: Secondary | ICD-10-CM | POA: Diagnosis not present

## 2018-05-08 DIAGNOSIS — D171 Benign lipomatous neoplasm of skin and subcutaneous tissue of trunk: Secondary | ICD-10-CM | POA: Diagnosis not present

## 2018-05-08 DIAGNOSIS — J9601 Acute respiratory failure with hypoxia: Secondary | ICD-10-CM | POA: Diagnosis not present

## 2018-05-08 DIAGNOSIS — Z8249 Family history of ischemic heart disease and other diseases of the circulatory system: Secondary | ICD-10-CM | POA: Insufficient documentation

## 2018-05-08 DIAGNOSIS — J209 Acute bronchitis, unspecified: Secondary | ICD-10-CM | POA: Diagnosis not present

## 2018-05-08 DIAGNOSIS — E039 Hypothyroidism, unspecified: Secondary | ICD-10-CM | POA: Insufficient documentation

## 2018-05-08 DIAGNOSIS — F419 Anxiety disorder, unspecified: Secondary | ICD-10-CM | POA: Insufficient documentation

## 2018-05-08 DIAGNOSIS — Z79899 Other long term (current) drug therapy: Secondary | ICD-10-CM | POA: Insufficient documentation

## 2018-05-08 DIAGNOSIS — K4090 Unilateral inguinal hernia, without obstruction or gangrene, not specified as recurrent: Secondary | ICD-10-CM | POA: Diagnosis not present

## 2018-05-08 DIAGNOSIS — K432 Incisional hernia without obstruction or gangrene: Secondary | ICD-10-CM | POA: Diagnosis not present

## 2018-05-08 DIAGNOSIS — I509 Heart failure, unspecified: Secondary | ICD-10-CM | POA: Diagnosis not present

## 2018-05-08 DIAGNOSIS — Z7982 Long term (current) use of aspirin: Secondary | ICD-10-CM | POA: Insufficient documentation

## 2018-05-08 DIAGNOSIS — Z833 Family history of diabetes mellitus: Secondary | ICD-10-CM | POA: Insufficient documentation

## 2018-05-08 DIAGNOSIS — F329 Major depressive disorder, single episode, unspecified: Secondary | ICD-10-CM | POA: Insufficient documentation

## 2018-05-08 DIAGNOSIS — R0600 Dyspnea, unspecified: Secondary | ICD-10-CM

## 2018-05-08 DIAGNOSIS — N183 Chronic kidney disease, stage 3 (moderate): Secondary | ICD-10-CM | POA: Insufficient documentation

## 2018-05-08 DIAGNOSIS — J690 Pneumonitis due to inhalation of food and vomit: Secondary | ICD-10-CM | POA: Insufficient documentation

## 2018-05-08 DIAGNOSIS — Z882 Allergy status to sulfonamides status: Secondary | ICD-10-CM | POA: Insufficient documentation

## 2018-05-08 HISTORY — PX: EXCISION OF ABDOMINAL WALL TUMOR: SHX6687

## 2018-05-08 HISTORY — PX: INCISIONAL HERNIA REPAIR: SHX193

## 2018-05-08 LAB — BASIC METABOLIC PANEL
Anion gap: 12 (ref 5–15)
BUN: 26 mg/dL — ABNORMAL HIGH (ref 8–23)
CO2: 25 mmol/L (ref 22–32)
Calcium: 8.5 mg/dL — ABNORMAL LOW (ref 8.9–10.3)
Chloride: 101 mmol/L (ref 98–111)
Creatinine, Ser: 1.19 mg/dL — ABNORMAL HIGH (ref 0.44–1.00)
GFR calc non Af Amer: 42 mL/min — ABNORMAL LOW (ref >60)
GFR, EST AFRICAN AMERICAN: 49 mL/min — AB (ref >60)
Glucose, Bld: 162 mg/dL — ABNORMAL HIGH (ref 70–99)
Potassium: 3.6 mmol/L (ref 3.5–5.1)
Sodium: 138 mmol/L (ref 135–145)

## 2018-05-08 LAB — CBC
HEMATOCRIT: 39.6 % (ref 36.0–46.0)
Hemoglobin: 13.3 g/dL (ref 12.0–15.0)
MCH: 31.7 pg (ref 26.0–34.0)
MCHC: 33.6 g/dL (ref 30.0–36.0)
MCV: 94.5 fL (ref 80.0–100.0)
Platelets: 204 10*3/uL (ref 150–400)
RBC: 4.19 MIL/uL (ref 3.87–5.11)
RDW: 14.6 % (ref 11.5–15.5)
WBC: 8.5 10*3/uL (ref 4.0–10.5)
nRBC: 0 % (ref 0.0–0.2)

## 2018-05-08 LAB — PROCALCITONIN: Procalcitonin: <0.1 ng/mL

## 2018-05-08 LAB — LACTIC ACID, PLASMA
Lactic Acid, Venous: 2.1 mmol/L (ref 0.5–1.9)
Lactic Acid, Venous: 2.4 mmol/L (ref 0.5–1.9)

## 2018-05-08 SURGERY — REPAIR, HERNIA, INCISIONAL, LAPAROSCOPIC
Anesthesia: General | Laterality: Right

## 2018-05-08 MED ORDER — ROCURONIUM BROMIDE 100 MG/10ML IV SOLN
INTRAVENOUS | Status: DC | PRN
  Administered 2018-05-08: 10 mg via INTRAVENOUS
  Administered 2018-05-08: 45 mg via INTRAVENOUS

## 2018-05-08 MED ORDER — SODIUM CHLORIDE 0.9 % IV SOLN
1.5000 g | Freq: Three times a day (TID) | INTRAVENOUS | Status: DC
  Administered 2018-05-08 – 2018-05-09 (×3): 1.5 g via INTRAVENOUS
  Filled 2018-05-08 (×5): qty 1.5

## 2018-05-08 MED ORDER — ENOXAPARIN SODIUM 40 MG/0.4ML ~~LOC~~ SOLN: 40.0000 mg | SUBCUTANEOUS | Status: DC

## 2018-05-08 MED ORDER — PROPOFOL 10 MG/ML IV BOLUS
INTRAVENOUS | Status: DC | PRN
  Administered 2018-05-08: 130 mg via INTRAVENOUS

## 2018-05-08 MED ORDER — FLUTICASONE PROPIONATE 50 MCG/ACT NA SUSP
1.0000 | Freq: Every day | NASAL | Status: DC | PRN
  Filled 2018-05-08: qty 16

## 2018-05-08 MED ORDER — FENTANYL CITRATE (PF) 100 MCG/2ML IJ SOLN
25.0000 ug | INTRAMUSCULAR | Status: DC | PRN
  Administered 2018-05-08 (×3): 25 ug via INTRAVENOUS

## 2018-05-08 MED ORDER — ACETAMINOPHEN 10 MG/ML IV SOLN
INTRAVENOUS | Status: AC
  Filled 2018-05-08: qty 100

## 2018-05-08 MED ORDER — BUPIVACAINE LIPOSOME 1.3 % IJ SUSP
INTRAMUSCULAR | Status: AC
  Filled 2018-05-08: qty 20

## 2018-05-08 MED ORDER — CYCLOBENZAPRINE HCL 10 MG PO TABS: 5.0000 mg | ORAL_TABLET | Freq: Three times a day (TID) | ORAL | Status: DC | PRN

## 2018-05-08 MED ORDER — PRAVASTATIN SODIUM 20 MG PO TABS
10.0000 mg | ORAL_TABLET | Freq: Every day | ORAL | Status: DC
  Administered 2018-05-08: 10 mg via ORAL
  Filled 2018-05-08: qty 1

## 2018-05-08 MED ORDER — ONDANSETRON HCL 4 MG/2ML IJ SOLN: 4.0000 mg | Freq: Four times a day (QID) | INTRAMUSCULAR | Status: DC | PRN

## 2018-05-08 MED ORDER — FUROSEMIDE 10 MG/ML IJ SOLN
INTRAMUSCULAR | Status: AC
  Filled 2018-05-08: qty 4

## 2018-05-08 MED ORDER — IPRATROPIUM-ALBUTEROL 0.5-2.5 (3) MG/3ML IN SOLN
3.0000 mL | Freq: Two times a day (BID) | RESPIRATORY_TRACT | Status: DC
  Administered 2018-05-08 – 2018-05-09 (×2): 3 mL via RESPIRATORY_TRACT
  Filled 2018-05-08 (×2): qty 3

## 2018-05-08 MED ORDER — NITROGLYCERIN 0.4 MG SL SUBL: 0.4000 mg | SUBLINGUAL_TABLET | SUBLINGUAL | Status: DC | PRN

## 2018-05-08 MED ORDER — SODIUM CHLORIDE 0.9 % IV SOLN: INTRAVENOUS | Status: DC

## 2018-05-08 MED ORDER — PHENYLEPHRINE HCL 10 MG/ML IJ SOLN
INTRAMUSCULAR | Status: DC | PRN
  Administered 2018-05-08: 150 ug via INTRAVENOUS
  Administered 2018-05-08 (×2): 100 ug via INTRAVENOUS

## 2018-05-08 MED ORDER — BUPIVACAINE-EPINEPHRINE 0.5% -1:200000 IJ SOLN
INTRAMUSCULAR | Status: DC | PRN
  Administered 2018-05-08: 30 mL

## 2018-05-08 MED ORDER — FENTANYL CITRATE (PF) 100 MCG/2ML IJ SOLN
INTRAMUSCULAR | Status: DC | PRN
  Administered 2018-05-08: 75 ug via INTRAVENOUS
  Administered 2018-05-08: 50 ug via INTRAVENOUS
  Administered 2018-05-08: 75 ug via INTRAVENOUS

## 2018-05-08 MED ORDER — SUGAMMADEX SODIUM 200 MG/2ML IV SOLN
INTRAVENOUS | Status: DC | PRN
  Administered 2018-05-08: 200 mg via INTRAVENOUS
  Administered 2018-05-08 (×2): 100 mg via INTRAVENOUS

## 2018-05-08 MED ORDER — LEVOTHYROXINE SODIUM 50 MCG PO TABS
50.0000 ug | ORAL_TABLET | Freq: Every day | ORAL | Status: DC
  Administered 2018-05-09: 50 ug via ORAL
  Filled 2018-05-08: qty 1

## 2018-05-08 MED ORDER — FUROSEMIDE 10 MG/ML IJ SOLN
INTRAMUSCULAR | Status: AC
  Filled 2018-05-08: qty 2

## 2018-05-08 MED ORDER — BUPIVACAINE-EPINEPHRINE (PF) 0.5% -1:200000 IJ SOLN
INTRAMUSCULAR | Status: AC
  Filled 2018-05-08: qty 30

## 2018-05-08 MED ORDER — FUROSEMIDE 10 MG/ML IJ SOLN
20.0000 mg | Freq: Once | INTRAMUSCULAR | Status: AC
  Administered 2018-05-08: 20 mg via INTRAVENOUS

## 2018-05-08 MED ORDER — FAMOTIDINE 20 MG PO TABS
20.0000 mg | ORAL_TABLET | Freq: Two times a day (BID) | ORAL | Status: DC
  Administered 2018-05-08 – 2018-05-09 (×2): 20 mg via ORAL
  Filled 2018-05-08 (×2): qty 1

## 2018-05-08 MED ORDER — ALBUTEROL SULFATE HFA 108 (90 BASE) MCG/ACT IN AERS: 2.0000 | INHALATION_SPRAY | Freq: Four times a day (QID) | RESPIRATORY_TRACT | Status: DC | PRN

## 2018-05-08 MED ORDER — LACTATED RINGERS IV SOLN
INTRAVENOUS | Status: DC | PRN
  Administered 2018-05-08: 08:00:00 via INTRAVENOUS

## 2018-05-08 MED ORDER — SODIUM CHLORIDE 0.9 % IV SOLN
INTRAVENOUS | Status: DC | PRN
  Administered 2018-05-08: 25 ug/min via INTRAVENOUS

## 2018-05-08 MED ORDER — IPRATROPIUM-ALBUTEROL 0.5-2.5 (3) MG/3ML IN SOLN
3.0000 mL | Freq: Once | RESPIRATORY_TRACT | Status: AC
  Administered 2018-05-08: 3 mL via RESPIRATORY_TRACT

## 2018-05-08 MED ORDER — IPRATROPIUM-ALBUTEROL 0.5-2.5 (3) MG/3ML IN SOLN
RESPIRATORY_TRACT | Status: AC
  Filled 2018-05-08: qty 3

## 2018-05-08 MED ORDER — SODIUM CHLORIDE 0.9 % IV SOLN
INTRAVENOUS | Status: DC | PRN
  Administered 2018-05-08: 40 mL

## 2018-05-08 MED ORDER — ROPINIROLE HCL 1 MG PO TABS
2.0000 mg | ORAL_TABLET | Freq: Two times a day (BID) | ORAL | Status: DC
  Administered 2018-05-08 – 2018-05-09 (×2): 2 mg via ORAL
  Filled 2018-05-08 (×2): qty 2

## 2018-05-08 MED ORDER — POTASSIUM CHLORIDE CRYS ER 10 MEQ PO TBCR
10.0000 meq | EXTENDED_RELEASE_TABLET | Freq: Every day | ORAL | Status: DC
  Administered 2018-05-09: 10 meq via ORAL
  Filled 2018-05-08: qty 1

## 2018-05-08 MED ORDER — ACETAMINOPHEN 10 MG/ML IV SOLN
INTRAVENOUS | Status: DC | PRN
  Administered 2018-05-08: 1000 mg via INTRAVENOUS

## 2018-05-08 MED ORDER — ALLOPURINOL 100 MG PO TABS
100.0000 mg | ORAL_TABLET | Freq: Two times a day (BID) | ORAL | Status: DC
  Administered 2018-05-08 – 2018-05-09 (×2): 100 mg via ORAL
  Filled 2018-05-08 (×2): qty 1

## 2018-05-08 MED ORDER — HYDROCODONE-ACETAMINOPHEN 5-325 MG PO TABS
1.0000 | ORAL_TABLET | ORAL | Status: DC | PRN
  Administered 2018-05-08: 1 via ORAL
  Administered 2018-05-09: 2 via ORAL
  Filled 2018-05-08: qty 2
  Filled 2018-05-08: qty 1

## 2018-05-08 MED ORDER — ONDANSETRON 4 MG PO TBDP: 4.0000 mg | ORAL_TABLET | Freq: Four times a day (QID) | ORAL | Status: DC | PRN

## 2018-05-08 MED ORDER — ALBUTEROL SULFATE (2.5 MG/3ML) 0.083% IN NEBU: 2.5000 mg | INHALATION_SOLUTION | Freq: Four times a day (QID) | RESPIRATORY_TRACT | Status: DC | PRN

## 2018-05-08 MED ORDER — SODIUM CHLORIDE 0.9 % IV SOLN
INTRAVENOUS | Status: AC
  Administered 2018-05-08: 15:00:00 via INTRAVENOUS

## 2018-05-08 MED ORDER — SODIUM CHLORIDE 0.9 % IV SOLN
INTRAVENOUS | Status: DC
  Administered 2018-05-08: 15:00:00 via INTRAVENOUS

## 2018-05-08 MED ORDER — FENTANYL CITRATE (PF) 250 MCG/5ML IJ SOLN
INTRAMUSCULAR | Status: AC
  Filled 2018-05-08: qty 5

## 2018-05-08 MED ORDER — CEFAZOLIN SODIUM-DEXTROSE 2-4 GM/100ML-% IV SOLN
INTRAVENOUS | Status: AC
  Filled 2018-05-08: qty 100

## 2018-05-08 MED ORDER — ONDANSETRON HCL 4 MG/2ML IJ SOLN
INTRAMUSCULAR | Status: AC
  Filled 2018-05-08: qty 2

## 2018-05-08 MED ORDER — MIDAZOLAM HCL 2 MG/2ML IJ SOLN
INTRAMUSCULAR | Status: DC | PRN
  Administered 2018-05-08: 1 mg via INTRAVENOUS

## 2018-05-08 MED ORDER — HYDROCODONE-ACETAMINOPHEN 5-325 MG PO TABS: 1.0000 | ORAL_TABLET | ORAL | 0 refills | Status: AC | PRN

## 2018-05-08 MED ORDER — DEXAMETHASONE SODIUM PHOSPHATE 10 MG/ML IJ SOLN
INTRAMUSCULAR | Status: DC | PRN
  Administered 2018-05-08: 5 mg via INTRAVENOUS

## 2018-05-08 MED ORDER — SODIUM CHLORIDE 0.9 % IV SOLN
INTRAVENOUS | Status: DC
  Administered 2018-05-08: 07:00:00 via INTRAVENOUS

## 2018-05-08 MED ORDER — ONDANSETRON HCL 4 MG/2ML IJ SOLN: 4.0000 mg | Freq: Once | INTRAMUSCULAR | Status: DC | PRN

## 2018-05-08 MED ORDER — ONDANSETRON HCL 4 MG/2ML IJ SOLN
INTRAMUSCULAR | Status: DC | PRN
  Administered 2018-05-08: 4 mg via INTRAVENOUS

## 2018-05-08 MED ORDER — SEVOFLURANE IN SOLN
RESPIRATORY_TRACT | Status: AC
  Filled 2018-05-08: qty 250

## 2018-05-08 MED ORDER — MIDAZOLAM HCL 2 MG/2ML IJ SOLN
INTRAMUSCULAR | Status: AC
  Filled 2018-05-08: qty 2

## 2018-05-08 MED ORDER — MORPHINE SULFATE (PF) 2 MG/ML IV SOLN: 2.0000 mg | INTRAVENOUS | Status: DC | PRN

## 2018-05-08 MED ORDER — POLYETHYLENE GLYCOL 3350 17 G PO PACK: 17.0000 g | PACK | Freq: Every day | ORAL | Status: DC | PRN

## 2018-05-08 MED ORDER — PROPOFOL 10 MG/ML IV BOLUS
INTRAVENOUS | Status: AC
  Filled 2018-05-08: qty 20

## 2018-05-08 SURGICAL SUPPLY — 61 items
BLADE SURG 15 STRL LF DISP TIS (BLADE) ×2 IMPLANT
BLADE SURG 15 STRL SS (BLADE) ×2
CANISTER SUCT 1200ML W/VALVE (MISCELLANEOUS) ×4 IMPLANT
CHLORAPREP W/TINT 26 (MISCELLANEOUS) ×4 IMPLANT
CNTNR SPEC 2.5X3XGRAD LEK (MISCELLANEOUS) ×2
CONT SPEC 4OZ STER OR WHT (MISCELLANEOUS) ×2
CONTAINER SPEC 2.5X3XGRAD LEK (MISCELLANEOUS) ×2 IMPLANT
COVER WAND RF STERILE (DRAPES) ×4 IMPLANT
DERMABOND ADVANCED (GAUZE/BANDAGES/DRESSINGS) ×2
DERMABOND ADVANCED .7 DNX12 (GAUZE/BANDAGES/DRESSINGS) ×2 IMPLANT
DEVICE SECURE STRAP 25 ABSORB (INSTRUMENTS) ×8 IMPLANT
DRAPE LAPAROTOMY 100X77 ABD (DRAPES) IMPLANT
DRAPE UNDER BUTTOCK W/FLU (DRAPES) IMPLANT
ELECT REM PT RETURN 9FT ADLT (ELECTROSURGICAL) ×4
ELECTRODE REM PT RTRN 9FT ADLT (ELECTROSURGICAL) ×2 IMPLANT
GAUZE SPONGE 4X4 12PLY STRL (GAUZE/BANDAGES/DRESSINGS) IMPLANT
GLOVE BIO SURGEON STRL SZ 6.5 (GLOVE) ×9 IMPLANT
GLOVE BIO SURGEON STRL SZ7.5 (GLOVE) ×4 IMPLANT
GLOVE BIO SURGEONS STRL SZ 6.5 (GLOVE) ×3
GLOVE BIOGEL PI IND STRL 6.5 (GLOVE) IMPLANT
GLOVE BIOGEL PI INDICATOR 6.5 (GLOVE)
GOWN STRL REUS W/ TWL LRG LVL3 (GOWN DISPOSABLE) ×6 IMPLANT
GOWN STRL REUS W/TWL LRG LVL3 (GOWN DISPOSABLE) ×6
IRRIGATION STRYKERFLOW (MISCELLANEOUS) IMPLANT
IRRIGATOR STRYKERFLOW (MISCELLANEOUS)
IV NS 1000ML (IV SOLUTION) ×2
IV NS 1000ML BAXH (IV SOLUTION) ×2 IMPLANT
KIT TURNOVER KIT A (KITS) ×4 IMPLANT
LABEL OR SOLS (LABEL) ×4 IMPLANT
LIGASURE VESSEL 5MM BLUNT TIP (ELECTROSURGICAL) ×4 IMPLANT
MARGIN MAP 10MM (MISCELLANEOUS) IMPLANT
MESH VENTRALIGHT ST 6X8 (Mesh General) ×2 IMPLANT
MESH VENTRLGHT ELIP 8X6XMFL (Mesh General) ×2 IMPLANT
NEEDLE FILTER BLUNT 18X 1/2SAF (NEEDLE)
NEEDLE FILTER BLUNT 18X1 1/2 (NEEDLE) IMPLANT
NEEDLE HYPO 25X1 1.5 SAFETY (NEEDLE) ×4 IMPLANT
NEEDLE VERESS 14GA 120MM (NEEDLE) ×4 IMPLANT
NS IRRIG 500ML POUR BTL (IV SOLUTION) ×4 IMPLANT
PACK BASIN MINOR ARMC (MISCELLANEOUS) IMPLANT
PACK LAP CHOLECYSTECTOMY (MISCELLANEOUS) ×4 IMPLANT
SCISSORS METZENBAUM CVD 33 (INSTRUMENTS) ×4 IMPLANT
SET TUBE SMOKE EVAC HIGH FLOW (TUBING) ×4 IMPLANT
SLEEVE ENDOPATH XCEL 5M (ENDOMECHANICALS) ×4 IMPLANT
SUT ETHILON 3-0 (SUTURE) IMPLANT
SUT ETHILON 5-0 FS-2 18 BLK (SUTURE) IMPLANT
SUT MNCRL 4-0 (SUTURE) ×2
SUT MNCRL 4-0 27XMFL (SUTURE) ×2
SUT PDS AB 0 CT1 27 (SUTURE) ×8 IMPLANT
SUT VIC AB 2-0 CT2 27 (SUTURE) IMPLANT
SUT VIC AB 2-0 SH 27 (SUTURE) ×2
SUT VIC AB 2-0 SH 27XBRD (SUTURE) ×2 IMPLANT
SUT VICRYL 0 AB UR-6 (SUTURE) ×4 IMPLANT
SUTURE MNCRL 4-0 27XMF (SUTURE) ×2 IMPLANT
SYR 10ML LL (SYRINGE) ×4 IMPLANT
SYR 5ML LL (SYRINGE) IMPLANT
TACKER 5MM HERNIA 3.5CML NAB (ENDOMECHANICALS) IMPLANT
TRAY FOLEY MTR SLVR 16FR STAT (SET/KITS/TRAYS/PACK) ×4 IMPLANT
TROCAR XCEL 12X100 BLDLESS (ENDOMECHANICALS) ×4 IMPLANT
TROCAR XCEL NON-BLD 11X100MML (ENDOMECHANICALS) IMPLANT
TROCAR XCEL NON-BLD 5MMX100MML (ENDOMECHANICALS) ×4 IMPLANT
WATER STERILE IRR 1000ML POUR (IV SOLUTION) IMPLANT

## 2018-05-08 NOTE — Interval H&P Note (Signed)
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  The various methods of treatment have been discussed with the patient and family. After consideration of risks, benefits and other options for treatment, the patient has consented to  Procedure(s): LAPAROSCOPIC VS. OPEN INCISIONAL HERNIA REPAIR WITH MESH (N/A) EXCISION OF ABDOMINAL WALL-LEFT (Left) as a surgical intervention.  The patient's history has been reviewed, patient examined, no change in status, stable for surgery.  I have reviewed the patient's chart and labs.  Hernias and lipoma area marked in the pre procedure room. Questions were answered to the patient's satisfaction.     Herbert Pun

## 2018-05-08 NOTE — Anesthesia Post-op Follow-up Note (Signed)
Anesthesia QCDR form completed.        

## 2018-05-08 NOTE — Progress Notes (Signed)
Family Meeting Note  Advance Directive:yes  Today a meeting took place with the Patient, daughter and sister at bedside    The following clinical team members were present during this meeting:MD  The following were discussed:Patient's diagnosis: Acute hypoxic respiratory failure, aspiration pneumonia, interstitial lung disease, chronic diastolic CHF, chronic atrial fibrillation not on any anticoagulation hyperlipidemia, hypothyroidism, coronary artery disease, chronic kidney disease stage III, GERD, he is admitted to surgical service for incisional hernia repair.  Hospitalist team is consulted and patient is started on IV antibiotics for aspiration pneumonia.  Plan of care discussed in detail with the patient and family members at bedside they all verbalized understanding of the plan   patient's progosis: Unable to determine and Goals for treatment: Full Code  Daughter Scarlet is the healthcare POA  Additional follow-up to be provided: Surgery and hospitalist  Time spent during discussion:17 MIN  Nicholes Mango, MD

## 2018-05-08 NOTE — Discharge Instructions (Signed)
  Diet: Resume home heart healthy regular diet.   Activity: No heavy lifting >20 pounds (children, pets, laundry, garbage) or strenuous activity until follow-up, but light activity and walking are encouraged. Do not drive or drink alcohol if taking narcotic pain medications.  Wound care: May shower with soapy water and pat dry (do not rub incisions), but no baths or submerging incision underwater until follow-up. (no swimming)   Medications: Resume all home medications. For mild to moderate pain: acetaminophen (Tylenol) or ibuprofen (if no kidney disease). Combining Tylenol with alcohol can substantially increase your risk of causing liver disease. Narcotic pain medications, if prescribed, can be used for severe pain, though may cause nausea, constipation, and drowsiness. Do not combine Tylenol and Norco within a 6 hour period as Norco contains Tylenol. If you do not need the narcotic pain medication, you do not need to fill the prescription.  Call office ([phone]) at any time if any questions, worsening pain, fevers/chills, bleeding, drainage from incision site, or other concerns.   AMBULATORY SURGERY  DISCHARGE INSTRUCTIONS   1) The drugs that you were given will stay in your system until tomorrow so for the next 24 hours you should not:  A) Drive an automobile B) Make any legal decisions C) Drink any alcoholic beverage   2) You may resume regular meals tomorrow.  Today it is better to start with liquids and gradually work up to solid foods.  You may eat anything you prefer, but it is better to start with liquids, then soup and crackers, and gradually work up to solid foods.   3) Please notify your doctor immediately if you have any unusual bleeding, trouble breathing, redness and pain at the surgery site, drainage, fever, or pain not relieved by medication.    4) Additional Instructions:        Please contact your physician with any problems or Same Day Surgery at  [phone], Monday through Friday 6 am to 4 pm, or [place] [facility] [place] [facility] number at [phone]. 

## 2018-05-08 NOTE — Consult Note (Signed)
Reason for Consult: Hypoxia  Referring Physician: Dr. Darliss Cheney is an 85 y.o. female.  With history of chronic atrial fibrillation, coronary artery disease, chronic atrial fibrillation, hypertension, hyperlipidemia, hypothyroidism, history of previous CHF with ejection fraction 60 to 65% 2016 he is admitted to Dr. Peyton Najjar service and had incisional hernia repair postoperatively patient became hypoxemic requiring 2 L of oxygen.  Without oxygen her sats were down to 86% on room air.  Never had smoking history in the past.  Chest x-ray has revealed chronic interstitial lung disease and new opacities bilaterally.  Daughter and sister at bedside.  Patient denies any chest pain   Past Medical History:  Diagnosis Date  . Afib ([place])   . Anxiety   . Anxiety   . Bronchitis    recent  . Cancer ([place])    Lymphoma   . Chronic kidney disease    ckd stage 3  . Congestive heart failure (CHF) ([place])   . Coronary artery disease    Status post stent placement in LAD  . Dysrhythmia   . GERD (gastroesophageal reflux disease)   . Hyperlipemia   . Hypertension   . Hypothyroidism   . Thyroid disease     Past Surgical History:  Procedure Laterality Date  . ABDOMINAL HYSTERECTOMY     complete  . CORONARY ANGIOPLASTY     stent  . CORONARY STENT PLACEMENT    . VEIN SURGERY     Endovenous ablation of saphenous vein    Family History  Problem Relation Age of Onset  . Heart Problems Mother   . Heart Problems Father   . Heart attack Father   . Diabetes Brother   . Diabetes Brother     Social History:  reports that she has never smoked. She has never used smokeless tobacco. She reports that she does not drink alcohol or use drugs.  Allergies:  Allergies  Allergen Reactions  . Sulfa Antibiotics Hives and Rash    Medications: I have reviewed the patient's current medications.  No results found for this or any previous visit (from the past 48 hour(s)).  Dg Chest 1 View  Result  Date: 05/08/2018 CLINICAL DATA:  Difficulty breathing.  Shortness of breath. EXAM: CHEST  1 VIEW COMPARISON:  02/12/2015 FINDINGS: Background pattern of chronic interstitial lung disease. Heart size is normal. Chronic aortic atherosclerosis. Poor inspiration. Patchy density is worsened bilaterally which could be due to progressive volume loss or patchy bronchopneumonia. No dense consolidation or lobar collapse. No effusion. IMPRESSION: Background pattern of chronic lung disease. Diminished lung volumes with increased patchy density bilaterally. This could be due to progressive interstitial lung disease, but the possibility of patchy bilateral bronchopneumonia does exist. Electronically Signed   By: Nelson Chimes M.D.   On: 05/08/2018 11:30    ROS:  CONSTITUTIONAL: Denies fevers, chills. Denies any fatigue, weakness.  EYES: Denies blurry vision, double vision, eye pain. EARS, NOSE, THROAT: Denies tinnitus, ear pain, hearing loss. RESPIRATORY: Denies cough, wheeze, shortness of breath.  CARDIOVASCULAR: Denies chest pain, palpitations, edema.  GASTROINTESTINAL: Denies nausea, vomiting, diarrhea, abdominal pain. Denies bright red blood per rectum. GENITOURINARY: Denies dysuria, hematuria. ENDOCRINE: Denies nocturia or thyroid problems. HEMATOLOGIC AND LYMPHATIC: Denies easy bruising or bleeding. SKIN: Denies rash or lesion. MUSCULOSKELETAL: Denies pain in neck, back, shoulder, knees, hips or arthritic symptoms.  NEUROLOGIC: Denies paralysis, paresthesias.  PSYCHIATRIC: Denies anxiety or depressive symptoms. Blood pressure (!) 99/57, pulse 71, temperature 98.2 F (36.8 C), temperature source Oral,  resp. rate 17, height 5\' 1"  (1.549 m), weight 68.8 kg, SpO2 94 %.   PHYSICAL EXAMINATION:  GENERAL: Well-nourished, well-developed  currently in no acute distress.  HEAD: Normocephalic, atraumatic.  EYES: Pupils equal, round, and reactive to light. Extraocular muscles intact. No scleral icterus.  MOUTH:  Moist mucosal membranes. Dentition intact. No abscess noted. EARS, NOSE, THROAT: Clear without exudates. No external lesions.  NECK: Supple. No thyromegaly. No nodules. No JVD.  PULMONARY: Clear to auscultation bilaterally without wheezes, rales, or rhonchi. No use of accessory muscles. Good respiratory effort. CHEST: Nontender to palpation.  CARDIOVASCULAR: S1, S2, regular rate and rhythm. No murmurs, rubs, or gallops.  GASTROINTESTINAL: Soft, minimal tenderness is present laparoscopic scars with Steri-Strips MUSCULOSKELETAL: No swelling, clubbing, edema. Range of motion full in all extremities. NEUROLOGIC:  No gross focal neurological deficits. Sensation intact. Reflexes intact. SKIN: No ulcerations, lesions, rash, cyanosis. Skin warm, dry. Turgor intact. PSYCHIATRIC: Mood, affect within normal limits. Patient awake, alert, oriented x 3. Insight and judgment intact.   Assessment/Plan:   # Acute hypoxic respiratory failure from aspiration pneumonia Oxygen via nasal cannula to keep sats greater than 92% Unasyn Sputum consented sensitivity if patient can provide sample Incentive spirometry Check lactic acid and procalcitonin Hydrate with IV fluids-closely monitor for symptoms and signs of fluid overload Echocardiogram  #Incisional hernia repair postop day #0 Pain management as needed Postop care per surgery  #GERD Pepcid  #Chronic interstitial lung disease Patient might be benefited with outpatient pulmonology follow-up  #Hyperlipidemia continue home medication Pravachol  #Chronic kidney disease stage III-monitor renal function closely and BMP ordered Avoid nephrotoxin agents  #History of hypothyroidism continue Synthyroid   #History of coronary artery disease Patient is asymptomatic continue close monitoring Sees Dr. Ubaldo Glassing as an outpatient continue follow-up as recommended   DVT prophylaxis-Lovenox subcu   TOTAL TIME TAKING CARE OF THIS PATIENT: 43 minutes.   Note:  This dictation was prepared with Dragon dictation along with smaller phrase technology. Any transcriptional errors that result from this process are unintentional.   @[facility] @ Pager - [phone] 05/08/2018, 3:10 PM

## 2018-05-08 NOTE — Brief Op Note (Signed)
05/08/2018  1:45 PM  PATIENT:  [name]  85 y.o. female  PRE-OPERATIVE DIAGNOSIS:  INCISIONAL HERNIA WITHOUT OBSTRUCTION OR GANGRENE, LIPOMA OF TORSO  POST-OPERATIVE DIAGNOSIS:  INCISIONAL HERNIA WITHOUT OBSTRUCTION OR GANGRENE, LIPOMA OF TORSO  PROCEDURE:  Procedure(s): LAPAROSCOPIC INCISIONAL HERNIA REPAIR WITH MESH (N/A) EXCISION OF ABDOMINAL WALL MASS-RIGHT (Right)  SURGEON:  Surgeon(s) and Role:    * Herbert Pun, MD - Primary  ANESTHESIA:   General and local  EBL:  20 mL   PLAN OF CARE: Admit for overnight observation. Patient at PACU with difficulty weaning off her oxygen.  She has received respiratory therapy and currently on 3 L of oxygen nasal cannula.  She is also sleepy.  Will admit her for observation and respiratory therapy to wean her off oxygen with the goal of discharging her tomorrow.  PATIENT DISPOSITION:  PACU - hemodynamically stable.

## 2018-05-08 NOTE — Progress Notes (Signed)
   05/08/18 1500  Clinical Encounter Type  Visited With Patient and family together  Visit Type Initial  Ch provided the AD documentation. Pt wanted the education when the daughter was with her. Pt will reach out to the ch when needed.

## 2018-05-08 NOTE — Anesthesia Procedure Notes (Signed)
Procedure Name: Intubation Date/Time: 05/08/2018 7:45 AM Performed by: Justus Memory, CRNA Pre-anesthesia Checklist: Patient identified, Patient being monitored, Timeout performed, Emergency Drugs available and Suction available Patient Re-evaluated:Patient Re-evaluated prior to induction Oxygen Delivery Method: Circle system utilized Preoxygenation: Pre-oxygenation with 100% oxygen Induction Type: IV induction Ventilation: Mask ventilation without difficulty Laryngoscope Size: Mac and 3 Grade View: Grade I Tube type: Oral Tube size: 7.0 mm Number of attempts: 1 Airway Equipment and Method: Stylet Placement Confirmation: ETT inserted through vocal cords under direct vision,  positive ETCO2 and breath sounds checked- equal and bilateral Secured at: 23 cm Tube secured with: Tape Dental Injury: Teeth and Oropharynx as per pre-operative assessment

## 2018-05-08 NOTE — Transfer of Care (Signed)
Immediate Anesthesia Transfer of Care Note  Patient: [name]  Procedure(s) Performed: LAPAROSCOPIC VS. OPEN INCISIONAL HERNIA REPAIR WITH MESH (N/A ) EXCISION OF ABDOMINAL WALL-RIGHT (Right )  Patient Location: PACU  Anesthesia Type:General  Level of Consciousness: sedated  Airway & Oxygen Therapy: Patient Spontanous Breathing and Patient connected to face mask oxygen  Post-op Assessment: Report given to RN and Post -op Vital signs reviewed and stable  Post vital signs: Reviewed and stable  Last Vitals:  Vitals Value Taken Time  BP 120/63 05/08/2018 11:02 AM  Temp    Pulse 85 05/08/2018 11:11 AM  Resp 23 05/08/2018 11:11 AM  SpO2 94 % 05/08/2018 11:11 AM  Vitals shown include unvalidated device data.  Last Pain:  Vitals:   05/08/18 0623  TempSrc: Oral  PainSc: 0-No pain         Complications: No apparent anesthesia complications

## 2018-05-08 NOTE — Op Note (Addendum)
Preoperative diagnosis: Incisional hernia. Lipoma of torso  Postoperative diagnosis: Incisional hernia. Lipoma of torso.   Procedure: Laparoscopic Ventral Hernia Repair with mesh                      Excision of abdominal wall mass  Anesthesia: GETA  Surgeon: Dr. Windell Moment  Wound Classification: Clean  Indications: Patient is a 85 y.o. female developed a ventral hernia in the site of a previous incision. This was symptomatic and repair was indicated. Laparoscopic approach was elected. Also having discomfort from abdominal wall mass on pressure.   Findings: 1. There were 2 small incisional hernia, reducible. The legth of the two most distant points was 10 cm  2. A 20 cm x 15cm Ventralight Echo ST mesh used for repair 3. No hollow viscus organ injury identified during procedure 4. Tension free repair achieved 5. A 4.5 cm fatty mass of the abdominal wall excised.  6. Adequate hemostasis  Description of procedure: The patient was brought into the operating room and placed on the table in the supine position. General anesthesia was administered. A time-out was completed verifying correct patient, procedure, site, positioning, and implant(s) and/ or special equipment prior to beginning this procedure. All bony prominences were padded. Both arms were tucked. A Foley was placed. An orogastric tube was placed. The abdomen was prepped from the nipples to proximal thighs and table to table with Chlorhexidine. The patient was draped in the usual sterile fashion.  On the left upper quadrant, the fascia was elevated and the Veress needle inserted. Proper position was confirmed by aspiration and saline meniscus test. Carbon dioxide was started on low flow. Once it was flowing easily, it was advanced to high flow.  The abdomen was insufflated with carbon dioxide to a pressure of 15 mmHg. The patient tolerated insufflation well. A 5 mm trocar was inserted in the left upper quadrant. The laparoscope was  inserted and the abdomen inspected. Under direct visualization an additional 5-mm trocar was placed in the left lower quadrant. Care was taken to avoid injury to the bladder or inferior epigastric vessels.  Upon examining the abdominal wall, there were two small hernias.  With LigaSure device, the omental adhesions to the abdominal wall and hernias, were taken down. A 12-mm trocar was placed over one of the hernias to be able to introduce the mesh. Extracorporeally, the mesh was oriented and the corners were marked with a marking pen. The top and bottom center of the mesh was marked with 0 PDS suture to serve as two transfascial anchoring sutures. The mesh was rolled into a tight cigar-like configuration and was introduced through the 12-mm port. After insertion of the mesh, using a suture passer PDA 0 sutures were passed in a figure of eight fashion being able to close the defects.    The mesh was placed within the peritoneal cavity without any complications. Then, using the proper orientation and the marks placed initially, the mesh was unraveled using graspers. The proper orientation of the mesh was maintained throughout the entire procedure, with the nonadherent hydrocellulose-coated side of the mesh facing the bowel. After proper positioning of the mesh, a suture passer was used to pull the transfascial sutures out corresponding stab incisions. The suture was adjusted to provide adequate tension on the mesh. This resulted in the placement of the mesh inside the abdomen covering the hernia defects with an adequate 5 cm overlap along the margins.  After the transfascial sutures were tied  down, the mesh was tacked using a AbsorbaTack. The tacks were placed in a circumferential fashion approximately 0.5 cm from the edge and approximately 1 cm from each other. Crowning was done with a second row of tacks circumferentially, especially in the lower part of the mesh so as to prevent mesh migration and hernia  recurrence. The abdominal cavity was inspected and the hernia repair appeared satisfactory. The trocars were removed under direct vision. No bleeding was noted. The laparoscope was withdrawn and the final trocar removed. The abdomen was allowed to collapse.   The skin was closed with 4-0 Monocryl.   The right sided abdominal mass was excised in the following fashion. With a blade #15, an elliptical incision was made using the skin lines. Sharp dissection was carried down and lesion was excised including dermal tissue. The mass measured 4.5 cm on its longest diameter. Deep dermal stitches were done with vicryl 2-0 to approximate skin and skin closed with Monocryl 4-0 in subcuticular fashion. Dermabon was applied to all incisions. Specimen sent to pathology.  The patient tolerated the procedure well and was brought to the PACU in stable condition.  Specimen: Abdominal wall lipoma  Complications: None  Estimated Blood Loss: 16mL

## 2018-05-08 NOTE — Progress Notes (Signed)
Pharmacy Antibiotic Note  [name] is a 85 y.o. female admitted on 05/08/2018 with aspiration pneumonia.  Pharmacy has been consulted for Unasyn dosing.  Plan: Unasyn 1.5gm every 8 hours  Height: 5\' 1"  (154.9 cm) Weight: 151 lb 10.8 oz (68.8 kg) IBW/kg (Calculated) : 47.8  Temp (24hrs), Avg:97.7 F (36.5 C), Min:97.2 F (36.2 C), Max:98.2 F (36.8 C)  Recent Labs  Lab 05/08/18 1517  WBC 8.5    CrCl = 31.8   Allergies  Allergen Reactions  . Sulfa Antibiotics Hives and Rash    Antimicrobials this admission: Cefazolin  03/13 >> on call to OR at 0739 3/13 Unasyn  03/13 >>   Thank you for allowing pharmacy to be a part of this patient's care.  Susen Haskew 05/08/2018 3:29 PM

## 2018-05-08 NOTE — Anesthesia Preprocedure Evaluation (Signed)
Anesthesia Evaluation  Patient identified by MRN, date of birth, ID band Patient awake    Reviewed: Allergy & Precautions, H&P , NPO status , Patient's Chart, lab work & pertinent test results, reviewed documented beta blocker date and time   Airway Mallampati: II  TM Distance: >3 FB Neck ROM: full    Dental  (+) Teeth Intact   Pulmonary neg pulmonary ROS, pneumonia,    Pulmonary exam normal        Cardiovascular Exercise Tolerance: Poor hypertension, On Medications + CAD, + Cardiac Stents, + Peripheral Vascular Disease and +CHF  negative cardio ROS Normal cardiovascular exam+ dysrhythmias  Rhythm:regular Rate:Normal     Neuro/Psych PSYCHIATRIC DISORDERS Anxiety Depression  Neuromuscular disease negative neurological ROS  negative psych ROS   GI/Hepatic negative GI ROS, Neg liver ROS, GERD  Medicated,  Endo/Other  negative endocrine ROSHypothyroidism   Renal/GU CRFRenal diseasenegative Renal ROS  negative genitourinary   Musculoskeletal   Abdominal   Peds  Hematology negative hematology ROS (+)   Anesthesia Other Findings Past Medical History: No date: Afib ([place]) No date: Anxiety No date: Anxiety No date: Bronchitis     Comment:  recent No date: Cancer ([place])     Comment:  Lymphoma  No date: Chronic kidney disease     Comment:  ckd stage 3 No date: Congestive heart failure (CHF) ([facility]) No date: Coronary artery disease     Comment:  Status post stent placement in LAD No date: Dysrhythmia No date: GERD (gastroesophageal reflux disease) No date: Hyperlipemia No date: Hypertension No date: Hypothyroidism No date: Thyroid disease Past Surgical History: No date: ABDOMINAL HYSTERECTOMY     Comment:  complete No date: CORONARY ANGIOPLASTY     Comment:  stent No date: CORONARY STENT PLACEMENT No date: VEIN SURGERY     Comment:  Endovenous ablation of saphenous vein BMI    Body Mass Index:  28.66 kg/m      Reproductive/Obstetrics negative OB ROS                             Anesthesia Physical Anesthesia Plan  ASA: III  Anesthesia Plan: General ETT   Post-op Pain Management:    Induction:   PONV Risk Score and Plan:   Airway Management Planned:   Additional Equipment:   Intra-op Plan:   Post-operative Plan:   Informed Consent: I have reviewed the patients History and Physical, chart, labs and discussed the procedure including the risks, benefits and alternatives for the proposed anesthesia with the patient or authorized representative who has indicated his/her understanding and acceptance.     Dental Advisory Given  Plan Discussed with: CRNA  Anesthesia Plan Comments:         Anesthesia Quick Evaluation

## 2018-05-08 NOTE — Progress Notes (Signed)
CRITICAL VALUE ALERT  Critical Value:  Lactic acid 2.1   Date & Time Notied:  05/08/18 at 1534   Provider Notified: yes  Orders Received/Actions taken: no new orders given

## 2018-05-09 ENCOUNTER — Observation Stay
Admission: RE | Admit: 2018-05-09 | Discharge: 2018-05-09 | Disposition: A | Discharge status: Home / Self Care | Payer: Medicare Other | Source: Ambulatory Visit | Attending: Internal Medicine | Admitting: Internal Medicine

## 2018-05-09 DIAGNOSIS — K432 Incisional hernia without obstruction or gangrene: Secondary | ICD-10-CM | POA: Diagnosis not present

## 2018-05-09 LAB — CBC
HCT: 37.3 % (ref 36.0–46.0)
HEMOGLOBIN: 12 g/dL (ref 12.0–15.0)
MCH: 30.9 pg (ref 26.0–34.0)
MCHC: 32.2 g/dL (ref 30.0–36.0)
MCV: 96.1 fL (ref 80.0–100.0)
NRBC: 0 % (ref 0.0–0.2)
Platelets: 183 10*3/uL (ref 150–400)
RBC: 3.88 MIL/uL (ref 3.87–5.11)
RDW: 14.8 % (ref 11.5–15.5)
WBC: 10.3 10*3/uL (ref 4.0–10.5)

## 2018-05-09 LAB — CREATININE, SERUM
Creatinine, Ser: 1.28 mg/dL — ABNORMAL HIGH (ref 0.44–1.00)
GFR calc Af Amer: 45 mL/min — ABNORMAL LOW (ref >60)
GFR calc non Af Amer: 39 mL/min — ABNORMAL LOW (ref >60)

## 2018-05-09 LAB — PROCALCITONIN: Procalcitonin: <0.1 ng/mL

## 2018-05-09 LAB — LACTIC ACID, PLASMA: LACTIC ACID, VENOUS: 1.1 mmol/L (ref 0.5–1.9)

## 2018-05-09 MED ORDER — FAMOTIDINE 20 MG PO TABS: 20.0000 mg | ORAL_TABLET | Freq: Every day | ORAL | Status: DC

## 2018-05-09 MED ORDER — AMOXICILLIN-POT CLAVULANATE 875-125 MG PO TABS: 1.0000 | ORAL_TABLET | Freq: Two times a day (BID) | ORAL | 0 refills | Status: AC

## 2018-05-09 MED ORDER — ALBUTEROL SULFATE HFA 108 (90 BASE) MCG/ACT IN AERS: 2.0000 | INHALATION_SPRAY | Freq: Four times a day (QID) | RESPIRATORY_TRACT | 0 refills | Status: DC | PRN

## 2018-05-09 MED ORDER — ENOXAPARIN SODIUM 30 MG/0.3ML ~~LOC~~ SOLN
30.0000 mg | SUBCUTANEOUS | Status: DC
  Administered 2018-05-09: 30 mg via SUBCUTANEOUS
  Filled 2018-05-09: qty 0.3

## 2018-05-09 NOTE — Progress Notes (Signed)
[facility] NAME: [name]    MR#:  [id number]  DATE OF BIRTH:  [birth year]  SUBJECTIVE:  CHIEF COMPLAINT:  No chief complaint on file. No shortness of breath now.  Was able to walk around nursing station.  She had to stop once or twice during that due to some shortness of breath but did not have any hypoxia.  On room air and comfortable.  REVIEW OF SYSTEMS:  CONSTITUTIONAL: No fever, fatigue or weakness.  EYES: No blurred or double vision.  EARS, NOSE, AND THROAT: No tinnitus or ear pain.  RESPIRATORY: No cough, shortness of breath, wheezing or hemoptysis.  CARDIOVASCULAR: No chest pain, orthopnea, edema.  GASTROINTESTINAL: No nausea, vomiting, diarrhea or abdominal pain.  GENITOURINARY: No dysuria, hematuria.  ENDOCRINE: No polyuria, nocturia,  HEMATOLOGY: No anemia, easy bruising or bleeding SKIN: No rash or lesion. MUSCULOSKELETAL: No joint pain or arthritis.   NEUROLOGIC: No tingling, numbness, weakness.  PSYCHIATRY: No anxiety or depression.   ROS  DRUG ALLERGIES:   Allergies  Allergen Reactions  . Sulfa Antibiotics Hives and Rash    VITALS:  Blood pressure 111/77, pulse 69, temperature 97.7 F (36.5 C), temperature source Oral, resp. rate 18, height 5\' 1"  (1.549 m), weight 68.8 kg, SpO2 97 %.  PHYSICAL EXAMINATION:  GENERAL:  85 y.o.-year-old patient lying in the bed with no acute distress.  EYES: Pupils equal, round, reactive to light and accommodation. No scleral icterus. Extraocular muscles intact.  HEENT: Head atraumatic, normocephalic. Oropharynx and nasopharynx clear.  NECK:  Supple, no jugular venous distention. No thyroid enlargement, no tenderness.  LUNGS: Normal breath sounds bilaterally, no wheezing, rales,rhonchi or crepitation. No use of accessory muscles of respiration.  CARDIOVASCULAR: S1, S2 normal. No murmurs, rubs, or gallops.  ABDOMEN: Soft, nontender, nondistended. Bowel sounds present. No  organomegaly or mass.  Status post surgical stitches on abdominal wall. EXTREMITIES: No pedal edema, cyanosis, or clubbing.  NEUROLOGIC: Cranial nerves II through XII are intact. Muscle strength 5/5 in all extremities. Sensation intact. Gait not checked.  PSYCHIATRIC: The patient is alert and oriented x 3.  SKIN: No obvious rash, lesion, or ulcer.   Physical Exam LABORATORY PANEL:   CBC Recent Labs  Lab 05/09/18 0048  WBC 10.3  HGB 12.0  HCT 37.3  PLT 183   ------------------------------------------------------------------------------------------------------------------  Chemistries  Recent Labs  Lab 05/08/18 1517 05/09/18 0048  NA 138  --   K 3.6  --   CL 101  --   CO2 25  --   GLUCOSE 162*  --   BUN 26*  --   CREATININE 1.19* 1.28*  CALCIUM 8.5*  --    ------------------------------------------------------------------------------------------------------------------  Cardiac Enzymes No results for input(s): TROPONINI in the last 168 hours. ------------------------------------------------------------------------------------------------------------------  RADIOLOGY:  Dg Chest 1 View  Result Date: 05/08/2018 CLINICAL DATA:  Difficulty breathing.  Shortness of breath. EXAM: CHEST  1 VIEW COMPARISON:  02/12/2015 FINDINGS: Background pattern of chronic interstitial lung disease. Heart size is normal. Chronic aortic atherosclerosis. Poor inspiration. Patchy density is worsened bilaterally which could be due to progressive volume loss or patchy bronchopneumonia. No dense consolidation or lobar collapse. No effusion. IMPRESSION: Background pattern of chronic lung disease. Diminished lung volumes with increased patchy density bilaterally. This could be due to progressive interstitial lung disease, but the possibility of patchy bilateral bronchopneumonia does exist. Electronically Signed   By: Nelson Chimes M.D.   On: 05/08/2018 11:30    ASSESSMENT AND PLAN:  Active Problems:    Incisional hernia  # Acute hypoxic respiratory failure from aspiration pneumonia Oxygen via nasal cannula to keep sats greater than 92%-weaned off on room air now. Unasyn-give Augmentin on discharge. Sputum consented sensitivity if patient can provide sample Incentive spirometry Check lactic acid and procalcitonin-not elevated. Hydrate with IV fluids-closely monitor for symptoms and signs of fluid overload Echocardiogram done but no reports available yet.  Patient follows with Dr. Ubaldo Glassing in the office advised to confirm with him as outpatient as patient was asymptomatic.  Patient had much improvement in symptoms.  Feeling much better.  Chest x-ray reported to have some interstitial lung disease so advised to follow with pulmonary clinic as outpatient and discharged home with oral Augmentin and albuterol inhaler.  #Incisional hernia repair postop day #1 Pain management as needed Postop care per surgery  #GERD Pepcid  #Chronic interstitial lung disease Patient might be benefited with outpatient pulmonology follow-up  #Hyperlipidemia continue home medication Pravachol  #Chronic kidney disease stage III-monitor renal function closely and BMP ordered Avoid nephrotoxin agents  #History of hypothyroidism continue Synthyroid   #History of coronary artery disease Patient is asymptomatic continue close monitoring Sees Dr. Ubaldo Glassing as an outpatient continue follow-up as recommended  Patient feels better and she would be okay to discharge home from medical standpoint.  All the records are reviewed and case discussed with Care Management/Social Workerr. Management plans discussed with the patient, family and they are in agreement.  CODE STATUS: Full.  TOTAL TIME TAKING CARE OF THIS PATIENT: 35 minutes.   Patient's daughter was present in the room during my visit.  Surgical Dr. was present in the room during my visit.  POSSIBLE D/C IN 1 DAYS, DEPENDING ON CLINICAL  CONDITION.   Vaughan Basta M.D on 05/09/2018   Between 7am to 6pm - Pager - [phone]  After 6pm go to [email or web address] - password [email or web address] [facility]  Office  [phone]  CC: Primary care physician; Tracie Harrier, MD  Note: This dictation was prepared with Dragon dictation along with smaller phrase technology. Any transcriptional errors that result from this process are unintentional.

## 2018-05-09 NOTE — Progress Notes (Signed)
[name]  A and O x 4. VSS. Pt tolerating diet well. No complaints of pain or nausea. IV removed intact, prescriptions given. Pt voiced understanding of discharge instructions with no further questions. Pt discharged via wheelchair.   Allergies as of 05/09/2018      Reactions   Sulfa Antibiotics Hives, Rash      Medication List    TAKE these medications   albuterol 108 (90 Base) MCG/ACT inhaler Commonly known as:  PROVENTIL HFA;VENTOLIN HFA Inhale 2 puffs into the lungs every 6 (six) hours as needed for wheezing or shortness of breath.   allopurinol 100 MG tablet Commonly known as:  ZYLOPRIM Take 100 mg by mouth 2 (two) times daily.   amoxicillin-clavulanate 875-125 MG tablet Commonly known as:  Augmentin Take 1 tablet by mouth 2 (two) times daily for 5 days.   cyclobenzaprine 5 MG tablet Commonly known as:  FLEXERIL Take 5 mg by mouth 3 (three) times daily as needed for muscle spasms.   famotidine 20 MG tablet Commonly known as:  PEPCID Take 20 mg by mouth 2 (two) times daily.   fluticasone 50 MCG/ACT nasal spray Commonly known as:  FLONASE Place 1 spray into the nose daily as needed for allergies.   furosemide 40 MG tablet Commonly known as:  LASIX Take 1 tablet (40 mg total) by mouth daily.   HYDROcodone-acetaminophen 5-325 MG tablet Commonly known as:  Norco Take 1 tablet by mouth every 4 (four) hours as needed for up to 3 days for moderate pain.   levothyroxine 50 MCG tablet Commonly known as:  SYNTHROID, LEVOTHROID Take 50 mcg by mouth daily before breakfast.   lovastatin 40 MG tablet Commonly known as:  MEVACOR Take 40 mg by mouth at bedtime.   magic mouthwash w/lidocaine Soln Take 5 mLs by mouth 3 (three) times daily as needed for mouth pain.   nitroGLYCERIN 0.4 MG SL tablet Commonly known as:  NITROSTAT Place 0.4 mg under the tongue every 5 (five) minutes x 3 doses as needed for chest pain.   ondansetron 4 MG disintegrating tablet Commonly known  as:  ZOFRAN-ODT Take 4 mg by mouth every 8 (eight) hours as needed for nausea or vomiting.   potassium chloride 10 MEQ tablet Commonly known as:  K-DUR,KLOR-CON Take 10 mEq by mouth daily.   predniSONE 10 MG (21) Tbpk tablet Commonly known as:  STERAPRED UNI-PAK 21 TAB Take 10-60 mg by mouth See admin instructions. (Typical regimens for 21 tablet dose packs of Methylprednisolone 4mg , Prednisone 5mg , and Prednisone 10mg ) Day 1: 2 tabs before breakfast, 1 tab after lunch, 1 tab after supper, and 2 tabs at bedtime. Day 2: 1 tab before breakfast, 1 tab after lunch, 1 tab after supper, and 2 tabs at bedtime. Day 3: 1 tab before breakfast, 1 tab after lunch, 1 tab after supper, and 1 tab at bedtime. Day 4: 1 tab before breakfast, 1 tab after lunch, and 1 tab at bedtime. Day 5: 1 tab before breakfast and 1 tab at bedtime. Day 6: 1 tab before breakfast.   rOPINIRole 2 MG tablet Commonly known as:  REQUIP Take 2 mg by mouth 2 (two) times daily.   traMADol 50 MG tablet Commonly known as:  ULTRAM Take 50 mg by mouth 2 (two) times daily as needed for moderate pain.       Vitals:   05/09/18 0832 05/09/18 1205  BP:  111/77  Pulse: 74 69  Resp: 20 18  Temp:  97.7  F (36.5 C)  SpO2: 94% 97%    Francesco Sor

## 2018-05-09 NOTE — Discharge Summary (Signed)
Patient ID: [name] MRN: [id number] DOB/AGE: [birth year] 85 y.o.  Admit date: 05/08/2018 Discharge date: 05/09/2018   Discharge Diagnoses:  Active Problems:   Incisional hernia   Acute bronchitis  Procedures: Laparoscopic incisional hernia repair with mesh                        Abdominal wall mass excision  Hospital Course: Patient was admitted for laparoscopic incisional hernia repair with mesh.  After surgery she developed difficulty maintaining her O2 sat without oxygen supplement.  She was admitted for observation and therapy of acute bronchitis.  Hospitalist evaluated the patient and started the patient on antibiotics and recommended to continue antibiotic for at least 5 days.  Today she is ambulating without oxygen.  The pain is controlled with oral pain medication.  Patient is tolerating regular diet.  Will discharge patient home with recommendation of following up with primary care physician on the recommendation to be seen by a pulmonologist.  I will see her in my office in 2 weeks.  Physical Exam  Constitutional: She is well-developed, well-nourished, and in no distress.  HENT:  Head: Normocephalic.  Cardiovascular: Normal rate and regular rhythm.  Pulmonary/Chest: Effort normal. No respiratory distress.  Abdominal: Soft. She exhibits no distension. There is no abdominal tenderness. There is no rebound.  Wounds are dry and clean    Consults: Hospitalist  Disposition: Discharge disposition: 01-Home or Self Care      Discharge Instructions    Diet - low sodium heart healthy   Complete by:  As directed    Diet - low sodium heart healthy   Complete by:  As directed    Increase activity slowly   Complete by:  As directed      Allergies as of 05/09/2018      Reactions   Sulfa Antibiotics Hives, Rash      Medication List    TAKE these medications   albuterol 108 (90 Base) MCG/ACT inhaler Commonly known as:  PROVENTIL HFA;VENTOLIN HFA Inhale 2 puffs into  the lungs every 6 (six) hours as needed for wheezing or shortness of breath.   allopurinol 100 MG tablet Commonly known as:  ZYLOPRIM Take 100 mg by mouth 2 (two) times daily.   amoxicillin-clavulanate 875-125 MG tablet Commonly known as:  Augmentin Take 1 tablet by mouth 2 (two) times daily for 5 days.   cyclobenzaprine 5 MG tablet Commonly known as:  FLEXERIL Take 5 mg by mouth 3 (three) times daily as needed for muscle spasms.   famotidine 20 MG tablet Commonly known as:  PEPCID Take 20 mg by mouth 2 (two) times daily.   fluticasone 50 MCG/ACT nasal spray Commonly known as:  FLONASE Place 1 spray into the nose daily as needed for allergies.   furosemide 40 MG tablet Commonly known as:  LASIX Take 1 tablet (40 mg total) by mouth daily.   HYDROcodone-acetaminophen 5-325 MG tablet Commonly known as:  Norco Take 1 tablet by mouth every 4 (four) hours as needed for up to 3 days for moderate pain.   levothyroxine 50 MCG tablet Commonly known as:  SYNTHROID, LEVOTHROID Take 50 mcg by mouth daily before breakfast.   lovastatin 40 MG tablet Commonly known as:  MEVACOR Take 40 mg by mouth at bedtime.   magic mouthwash w/lidocaine Soln Take 5 mLs by mouth 3 (three) times daily as needed for mouth pain.   nitroGLYCERIN 0.4 MG SL tablet Commonly known as:  NITROSTAT  Place 0.4 mg under the tongue every 5 (five) minutes x 3 doses as needed for chest pain.   ondansetron 4 MG disintegrating tablet Commonly known as:  ZOFRAN-ODT Take 4 mg by mouth every 8 (eight) hours as needed for nausea or vomiting.   potassium chloride 10 MEQ tablet Commonly known as:  K-DUR,KLOR-CON Take 10 mEq by mouth daily.   predniSONE 10 MG (21) Tbpk tablet Commonly known as:  STERAPRED UNI-PAK 21 TAB Take 10-60 mg by mouth See admin instructions. (Typical regimens for 21 tablet dose packs of Methylprednisolone 4mg , Prednisone 5mg , and Prednisone 10mg ) Day 1: 2 tabs before breakfast, 1 tab after  lunch, 1 tab after supper, and 2 tabs at bedtime. Day 2: 1 tab before breakfast, 1 tab after lunch, 1 tab after supper, and 2 tabs at bedtime. Day 3: 1 tab before breakfast, 1 tab after lunch, 1 tab after supper, and 1 tab at bedtime. Day 4: 1 tab before breakfast, 1 tab after lunch, and 1 tab at bedtime. Day 5: 1 tab before breakfast and 1 tab at bedtime. Day 6: 1 tab before breakfast.   rOPINIRole 2 MG tablet Commonly known as:  REQUIP Take 2 mg by mouth 2 (two) times daily.   traMADol 50 MG tablet Commonly known as:  ULTRAM Take 50 mg by mouth 2 (two) times daily as needed for moderate pain.      Follow-up Information    Herbert Pun, MD Follow up on 05/22/2018.   Specialty:  General Surgery Why:  @ 10:00 am for post op visit Contact information: [place] [address] [phone]        Erby Pian, MD Follow up in 1 week(s).   Specialty:  Specialist Why:  interstitial lung disease, new diagnosis Contact information: [place] [address] [phone]

## 2018-05-09 NOTE — Progress Notes (Signed)
Pharmacy Lovenox dose adjustment Patient was ordered lovenox 40 mg subq daily CrCl 29.5 ml/min   Will readjust to lovenox 30 mg subq daily  Tobie Lords, PharmD, BCPS Clinical Pharmacist 05/09/2018

## 2018-05-09 NOTE — Care Management Obs Status (Signed)
[place] NOTIFICATION   Patient Details  Name: [name] MRN: [id number] Date of Birth: 09/20/1932   Medicare Observation Status Notification Given:  Yes    Reyaan Thoma A Arthi Mcdonald, RN 05/09/2018, 11:08 AM

## 2018-05-10 ENCOUNTER — Encounter: Payer: Self-pay | Admitting: General Surgery

## 2018-05-10 LAB — ECHOCARDIOGRAM COMPLETE
Height: 61 in
Weight: 2426.82 oz

## 2018-05-12 NOTE — Anesthesia Postprocedure Evaluation (Signed)
Anesthesia Post Note  Patient: JADAH BOBAK  Procedure(s) Performed: LAPAROSCOPIC VS. OPEN INCISIONAL HERNIA REPAIR WITH MESH (N/A ) EXCISION OF ABDOMINAL WALL-RIGHT (Right )  Patient location during evaluation: PACU Anesthesia Type: General Level of consciousness: awake and alert Pain management: pain level controlled Vital Signs Assessment: post-procedure vital signs reviewed and stable Respiratory status: spontaneous breathing, nonlabored ventilation, respiratory function stable and patient connected to nasal cannula oxygen Cardiovascular status: blood pressure returned to baseline and stable Postop Assessment: no apparent nausea or vomiting Anesthetic complications: no     Last Vitals:  Vitals:   05/09/18 0832 05/09/18 1205  BP:  111/77  Pulse: 74 69  Resp: 20 18  Temp:  36.5 C  SpO2: 94% 97%    Last Pain:  Vitals:   05/09/18 1205  TempSrc: Oral  PainSc:                  Molli Barrows

## 2018-05-19 ENCOUNTER — Other Ambulatory Visit: Payer: Self-pay | Admitting: Specialist

## 2018-05-19 DIAGNOSIS — J849 Interstitial pulmonary disease, unspecified: Secondary | ICD-10-CM

## 2018-05-20 LAB — SURGICAL PATHOLOGY

## 2018-05-22 DIAGNOSIS — J449 Chronic obstructive pulmonary disease, unspecified: Secondary | ICD-10-CM | POA: Insufficient documentation

## 2018-06-02 ENCOUNTER — Ambulatory Visit
Admission: RE | Admit: 2018-06-02 | Discharge: 2018-06-02 | Disposition: A | Discharge status: Home / Self Care | Payer: Medicare Other | Source: Ambulatory Visit | Attending: Specialist | Admitting: Specialist

## 2018-06-02 ENCOUNTER — Other Ambulatory Visit: Payer: Self-pay

## 2018-06-02 DIAGNOSIS — J849 Interstitial pulmonary disease, unspecified: Secondary | ICD-10-CM | POA: Diagnosis not present

## 2018-06-29 ENCOUNTER — Telehealth (INDEPENDENT_AMBULATORY_CARE_PROVIDER_SITE_OTHER): Payer: Self-pay | Admitting: Vascular Surgery

## 2018-06-29 NOTE — Telephone Encounter (Signed)
We can bring her in for bilateral venous reflux studies sometime next week to determine if there is an intervention that can be done to help with swelling.  In the event that there is nothing that we can intervene on, continued conservative therapy will be needed with compression socks and elevation.  I would also suggest that she contact Dr. Ginette Pitman or her cardiologist to determine if the swelling in part due to her congestive heart failure and if she would need adjustments to her diuretics.  She can see me or dew next week.

## 2018-06-29 NOTE — Telephone Encounter (Signed)
I spoke with patient and she stated that she has been elevating and wearing compression stockings for swelling,also the patient has been taking Tramadol for pain as needed

## 2018-06-29 NOTE — Telephone Encounter (Signed)
Spoke with the patient and she is aware with medical advice from [facility] NP and she has been schedule to come in the office for 07/06/2018

## 2018-07-06 ENCOUNTER — Other Ambulatory Visit: Payer: Self-pay

## 2018-07-06 ENCOUNTER — Other Ambulatory Visit (INDEPENDENT_AMBULATORY_CARE_PROVIDER_SITE_OTHER): Payer: Self-pay | Admitting: Nurse Practitioner

## 2018-07-06 ENCOUNTER — Encounter (INDEPENDENT_AMBULATORY_CARE_PROVIDER_SITE_OTHER): Payer: Self-pay | Admitting: Nurse Practitioner

## 2018-07-06 ENCOUNTER — Ambulatory Visit (INDEPENDENT_AMBULATORY_CARE_PROVIDER_SITE_OTHER): Payer: Medicare Other | Admitting: Nurse Practitioner

## 2018-07-06 ENCOUNTER — Ambulatory Visit (INDEPENDENT_AMBULATORY_CARE_PROVIDER_SITE_OTHER): Payer: Medicare Other

## 2018-07-06 VITALS — BP 136/81 | HR 89 | Resp 16 | Wt 153.2 lb

## 2018-07-06 DIAGNOSIS — R600 Localized edema: Secondary | ICD-10-CM

## 2018-07-06 DIAGNOSIS — Z79899 Other long term (current) drug therapy: Secondary | ICD-10-CM

## 2018-07-06 DIAGNOSIS — I83812 Varicose veins of left lower extremities with pain: Secondary | ICD-10-CM

## 2018-07-06 DIAGNOSIS — E785 Hyperlipidemia, unspecified: Secondary | ICD-10-CM | POA: Diagnosis not present

## 2018-07-06 DIAGNOSIS — M79605 Pain in left leg: Secondary | ICD-10-CM

## 2018-07-06 DIAGNOSIS — M7989 Other specified soft tissue disorders: Secondary | ICD-10-CM

## 2018-07-06 NOTE — Progress Notes (Signed)
SUBJECTIVE:  Patient ID: [name], female    DOB: 1932/03/10, 85 y.o.   MRN: [id number] Chief Complaint  Patient presents with  . Follow-up    ultrasound follow up    HPI  [name] is a 85 y.o. female that presents today with complaint about left lower extremity swelling and pain.  She states that this is been going on for the last several days and it is causing issues with her ability to sleep.  Patient states that her swelling is worse in the evening after standing all day and better in the morning.  Patient does endorse wearing medical grade 1 compression stockings she has been wearing these compression stockings since her sclerotherapy which was done in 2018.  She denies any fever, chills, nausea, vomiting or diarrhea.  She also endorses elevating her daily basis as well as exercise.  The patient has a previous history of sclerotherapy down her left lower extremity due to prominent varicose vein.  That varicose veins are still prominent at this time.  Patient underwent a venous reflux study today which revealed reflux in her left great saphenous vein.  No evidence of DVT or superficial venous thrombosis.  Past Medical History:  Diagnosis Date  . Afib ([place])   . Anxiety   . Anxiety   . Bronchitis    recent  . Cancer ([place])    Lymphoma   . Chronic kidney disease    ckd stage 3  . Congestive heart failure (CHF) ([place])   . Coronary artery disease    Status post stent placement in LAD  . Dysrhythmia   . GERD (gastroesophageal reflux disease)   . Hyperlipemia   . Hypertension   . Hypothyroidism   . Thyroid disease     Past Surgical History:  Procedure Laterality Date  . ABDOMINAL HYSTERECTOMY     complete  . CORONARY ANGIOPLASTY     stent  . CORONARY STENT PLACEMENT    . EXCISION OF ABDOMINAL WALL TUMOR Right 05/08/2018   Procedure: EXCISION OF ABDOMINAL WALL-RIGHT;  Surgeon: Herbert Pun, MD;  Location: [facility] ORS;  Service: General;  Laterality: Right;   . INCISIONAL HERNIA REPAIR N/A 05/08/2018   Procedure: LAPAROSCOPIC VS. OPEN INCISIONAL HERNIA REPAIR WITH MESH;  Surgeon: Herbert Pun, MD;  Location: [facility] ORS;  Service: General;  Laterality: N/A;  . VEIN SURGERY     Endovenous ablation of saphenous vein    Social History   Socioeconomic History  . Marital status: Single    Spouse name: Not on file  . Number of children: Not on file  . Years of education: Not on file  . Highest education level: Not on file  Occupational History  . Occupation: retired  Scientific laboratory technician  . Financial resource strain: Not on file  . Food insecurity:    Worry: Not on file    Inability: Not on file  . Transportation needs:    Medical: Not on file    Non-medical: Not on file  Tobacco Use  . Smoking status: Never Smoker  . Smokeless tobacco: Never Used  Substance and Sexual Activity  . Alcohol use: No  . Drug use: No  . Sexual activity: Not on file  Lifestyle  . Physical activity:    Days per week: Not on file    Minutes per session: Not on file  . Stress: Not on file  Relationships  . Social connections:    Talks on phone: Not on file  Gets together: Not on file    Attends religious service: Not on file    Active member of club or organization: Not on file    Attends meetings of clubs or organizations: Not on file    Relationship status: Not on file  . Intimate partner violence:    Fear of current or ex partner: Not on file    Emotionally abused: Not on file    Physically abused: Not on file    Forced sexual activity: Not on file  Other Topics Concern  . Not on file  Social History Narrative   Lives at home independently. Has a cane to ambulate occasionally. Continues to drive.    Family History  Problem Relation Age of Onset  . Heart Problems Mother   . Heart Problems Father   . Heart attack Father   . Diabetes Brother   . Diabetes Brother     Allergies  Allergen Reactions  . Sulfa Antibiotics Hives and Rash      Review of Systems   Review of Systems: Negative Unless Checked Constitutional: [] Weight loss  [] Fever  [] Chills Cardiac: [] Chest pain   []  Atrial Fibrillation  [] Palpitations   [] Shortness of breath when laying flat   [] Shortness of breath with exertion. [] Shortness of breath at rest Vascular:  [] Pain in legs with walking   [] Pain in legs with standing [] Pain in legs when laying flat   [] Claudication    [] Pain in feet when laying flat    [] History of DVT   [] Phlebitis   [x] Swelling in legs   [x] Varicose veins   [] Non-healing ulcers Pulmonary:   [] Uses home oxygen   [] Productive cough   [] Hemoptysis   [] Wheeze  [] COPD   [] Asthma Neurologic:  [] Dizziness   [] Seizures  [] Blackouts [] History of stroke   [] History of TIA  [] Aphasia   [] Temporary Blindness   [] Weakness or numbness in arm   [] Weakness or numbness in leg Musculoskeletal:   [] Joint swelling   [] Joint pain   [] Low back pain  []  History of Knee Replacement [] Arthritis [] back Surgeries  []  Spinal Stenosis    Hematologic:  [] Easy bruising  [] Easy bleeding   [] Hypercoagulable state   [x] Anemic Gastrointestinal:  [] Diarrhea   [] Vomiting  [] Gastroesophageal reflux/heartburn   [] Difficulty swallowing. [] Abdominal pain Genitourinary:  [x] Chronic kidney disease   [] Difficult urination  [] Anuric   [] Blood in urine [] Frequent urination  [] Burning with urination   [] Hematuria Skin:  [] Rashes   [] Ulcers [] Wounds Psychological:  [] History of anxiety   [x]  History of major depression  []  Memory Difficulties      OBJECTIVE:   Physical Exam  BP 136/81 (BP Location: Right Arm)   Pulse 89   Resp 16   Wt 153 lb 3.2 oz (69.5 kg)   BMI 28.95 kg/m   Gen: WD/WN, NAD Head: [place]/AT, No temporalis wasting.  Ear/Nose/Throat: Hearing grossly intact, nares w/o erythema or drainage Eyes: PER, EOMI, sclera nonicteric.  Neck: Supple, no masses.  No JVD.  Pulmonary:  Good air movement, no use of accessory muscles.  Cardiac: RRR Vascular:  Large varicosity on  left medial surface.  Running from mid calf to ankle.  Scattered smaller varicosities. Vessel Right Left  Radial Palpable Palpable   Gastrointestinal: soft, non-distended. No guarding/no peritoneal signs.  Musculoskeletal: M/S 5/5 throughout.  No deformity or atrophy.  Neurologic: Pain and light touch intact in extremities.  Symmetrical.  Speech is fluent. Motor exam as listed above. Psychiatric: Judgment intact, Mood & affect appropriate for  pt's clinical situation. Dermatologic: No Venous rashes. No Ulcers Noted.  No changes consistent with cellulitis. Lymph : No Cervical lymphadenopathy, no lichenification or skin changes of chronic lymphedema.       ASSESSMENT AND PLAN:  1. Varicose veins of leg with pain, left At this time the patient wishes to continue with conservative therapy for her varicose veins.  We discussed the option of a possible endovenous laser ablation which would be helpful for pain as well as swelling.  She states that she will continue with medical grade 1 compression stockings as well as elevation, exercise and NSAIDs for pain control.  She states that if she is unable to control her varicose veins and swelling with conservative measures she will contact our office.  Otherwise, the patient will follow-up on an as-needed basis.  2. Edema leg Patient was concerned that swelling could be due to a deep venous thrombosis.  Based on results of study  3. Hyperlipidemia, unspecified hyperlipidemia type Continue statin as ordered and reviewed, no changes at this time    Current Outpatient Medications on File Prior to Visit  Medication Sig Dispense Refill  . albuterol (PROVENTIL HFA;VENTOLIN HFA) 108 (90 Base) MCG/ACT inhaler Inhale 2 puffs into the lungs every 6 (six) hours as needed for wheezing or shortness of breath. 1 Inhaler 0  . allopurinol (ZYLOPRIM) 100 MG tablet Take 100 mg by mouth 2 (two) times daily.   1  . aspirin EC 81 MG tablet Take 81 mg by mouth daily.     . benzonatate (TESSALON) 200 MG capsule Take 200 mg by mouth 3 (three) times daily as needed for cough.    . cyclobenzaprine (FLEXERIL) 5 MG tablet Take 5 mg by mouth 3 (three) times daily as needed for muscle spasms.     . famotidine (PEPCID) 20 MG tablet Take 20 mg by mouth 2 (two) times daily.  2  . fluticasone (FLONASE) 50 MCG/ACT nasal spray Place 1 spray into the nose daily as needed for allergies.     . furosemide (LASIX) 40 MG tablet Take 1 tablet (40 mg total) by mouth daily. 30 tablet 1  . HYDROcodone-acetaminophen (NORCO/VICODIN) 5-325 MG tablet Take 1 tablet by mouth every 6 (six) hours as needed for moderate pain.    Marland Kitchen levothyroxine (SYNTHROID, LEVOTHROID) 50 MCG tablet Take 50 mcg by mouth daily before breakfast.    . lovastatin (MEVACOR) 40 MG tablet Take 40 mg by mouth at bedtime.     . magic mouthwash w/lidocaine SOLN Take 5 mLs by mouth 3 (three) times daily as needed for mouth pain.    . montelukast (SINGULAIR) 10 MG tablet TAKE 1 TABLET BY MOUTH EVERY DAY AT NIGHT    . nitroGLYCERIN (NITROSTAT) 0.4 MG SL tablet Place 0.4 mg under the tongue every 5 (five) minutes x 3 doses as needed for chest pain.     Marland Kitchen ondansetron (ZOFRAN-ODT) 4 MG disintegrating tablet Take 4 mg by mouth every 8 (eight) hours as needed for nausea or vomiting.   0  . potassium chloride (K-DUR,KLOR-CON) 10 MEQ tablet Take 10 mEq by mouth daily.     Marland Kitchen rOPINIRole (REQUIP) 2 MG tablet Take 2 mg by mouth 2 (two) times daily.    . traMADol (ULTRAM) 50 MG tablet Take 50 mg by mouth 2 (two) times daily as needed for moderate pain.   3  . chlorpheniramine-HYDROcodone (TUSSIONEX) 10-8 MG/5ML SUER TAKE 5 MLS BY MOUTH NIGHTLY AS NEEDED FOR COUGH    .  predniSONE (STERAPRED UNI-PAK 21 TAB) 10 MG (21) TBPK tablet Take 10-60 mg by mouth See admin instructions. (Typical regimens for 21 tablet dose packs of Methylprednisolone 4mg , Prednisone 5mg , and Prednisone 10mg ) Day 1: 2 tabs before breakfast, 1 tab after lunch, 1 tab  after supper, and 2 tabs at bedtime. Day 2: 1 tab before breakfast, 1 tab after lunch, 1 tab after supper, and 2 tabs at bedtime. Day 3: 1 tab before breakfast, 1 tab after lunch, 1 tab after supper, and 1 tab at bedtime. Day 4: 1 tab before breakfast, 1 tab after lunch, and 1 tab at bedtime. Day 5: 1 tab before breakfast and 1 tab at bedtime. Day 6: 1 tab before breakfast.     No current facility-administered medications on file prior to visit.     There are no Patient Instructions on file for this visit. Return if symptoms worsen or fail to improve.   Kris Hartmann, NP  This note was completed with Sales executive.  Any errors are purely unintentional.

## 2018-07-16 ENCOUNTER — Other Ambulatory Visit: Payer: Medicare Other

## 2018-07-16 ENCOUNTER — Ambulatory Visit: Payer: Medicare Other | Admitting: Oncology

## 2018-07-17 ENCOUNTER — Inpatient Hospital Stay: Payer: Medicare Other

## 2018-07-17 ENCOUNTER — Inpatient Hospital Stay: Payer: Medicare Other | Admitting: Oncology

## 2018-08-18 ENCOUNTER — Other Ambulatory Visit: Payer: Self-pay

## 2018-08-18 ENCOUNTER — Encounter: Payer: Self-pay | Admitting: Oncology

## 2018-08-18 ENCOUNTER — Inpatient Hospital Stay: Payer: Medicare Other | Attending: Oncology

## 2018-08-18 ENCOUNTER — Inpatient Hospital Stay (HOSPITAL_BASED_OUTPATIENT_CLINIC_OR_DEPARTMENT_OTHER): Payer: Medicare Other | Admitting: Oncology

## 2018-08-18 VITALS — BP 108/62 | HR 69 | Temp 99.1°F | Resp 18 | Wt 160.0 lb

## 2018-08-18 DIAGNOSIS — Z9221 Personal history of antineoplastic chemotherapy: Secondary | ICD-10-CM | POA: Diagnosis not present

## 2018-08-18 DIAGNOSIS — Z8579 Personal history of other malignant neoplasms of lymphoid, hematopoietic and related tissues: Secondary | ICD-10-CM

## 2018-08-18 DIAGNOSIS — Z79899 Other long term (current) drug therapy: Secondary | ICD-10-CM | POA: Diagnosis not present

## 2018-08-18 DIAGNOSIS — Z8572 Personal history of non-Hodgkin lymphomas: Secondary | ICD-10-CM | POA: Insufficient documentation

## 2018-08-18 DIAGNOSIS — E041 Nontoxic single thyroid nodule: Secondary | ICD-10-CM | POA: Diagnosis not present

## 2018-08-18 LAB — CBC WITH DIFFERENTIAL/PLATELET
Abs Immature Granulocytes: 0.02 10*3/uL (ref 0.00–0.07)
Basophils Absolute: 0 10*3/uL (ref 0.0–0.1)
Basophils Relative: 1 %
Eosinophils Absolute: 0.3 10*3/uL (ref 0.0–0.5)
Eosinophils Relative: 5 %
HCT: 38 % (ref 36.0–46.0)
Hemoglobin: 12.5 g/dL (ref 12.0–15.0)
Immature Granulocytes: 0 %
Lymphocytes Relative: 27 %
Lymphs Abs: 1.7 10*3/uL (ref 0.7–4.0)
MCH: 31.3 pg (ref 26.0–34.0)
MCHC: 32.9 g/dL (ref 30.0–36.0)
MCV: 95 fL (ref 80.0–100.0)
Monocytes Absolute: 0.7 10*3/uL (ref 0.1–1.0)
Monocytes Relative: 11 %
Neutro Abs: 3.4 10*3/uL (ref 1.7–7.7)
Neutrophils Relative %: 56 %
Platelets: 227 10*3/uL (ref 150–400)
RBC: 4 MIL/uL (ref 3.87–5.11)
RDW: 14.6 % (ref 11.5–15.5)
WBC: 6.1 10*3/uL (ref 4.0–10.5)
nRBC: 0 % (ref 0.0–0.2)

## 2018-08-18 LAB — COMPREHENSIVE METABOLIC PANEL
ALT: 16 U/L (ref 0–44)
AST: 20 U/L (ref 15–41)
Albumin: 4 g/dL (ref 3.5–5.0)
Alkaline Phosphatase: 79 U/L (ref 38–126)
Anion gap: 8 (ref 5–15)
BUN: 25 mg/dL — ABNORMAL HIGH (ref 8–23)
CO2: 28 mmol/L (ref 22–32)
Calcium: 9 mg/dL (ref 8.9–10.3)
Chloride: 105 mmol/L (ref 98–111)
Creatinine, Ser: 1.16 mg/dL — ABNORMAL HIGH (ref 0.44–1.00)
GFR calc Af Amer: 50 mL/min — ABNORMAL LOW (ref >60)
GFR calc non Af Amer: 44 mL/min — ABNORMAL LOW (ref >60)
Glucose, Bld: 102 mg/dL — ABNORMAL HIGH (ref 70–99)
Potassium: 4.5 mmol/L (ref 3.5–5.1)
Sodium: 141 mmol/L (ref 135–145)
Total Bilirubin: 0.7 mg/dL (ref 0.3–1.2)
Total Protein: 7 g/dL (ref 6.5–8.1)

## 2018-08-18 LAB — LACTATE DEHYDROGENASE: LDH: 181 U/L (ref 98–192)

## 2018-08-18 NOTE — Progress Notes (Signed)
Patient here for follow up. No concerns voiced.  °

## 2018-08-20 NOTE — Progress Notes (Signed)
Hematology/Oncology follow up note [facility] Telephone:([phone]) [phone] Fax:(336) [phone]   Patient Care Team: Tracie Harrier, MD as PCP - General (Internal Medicine)  REFERRING PROVIDER: Dr. Ginette Pitman CHIEF COMPLAINTS/REASON FOR VISIT:  Evaluation of neck swelling, history of lymphoma.  HISTORY OF PRESENTING ILLNESS:  [name] is a  85 y.o.  female with PMH listed below who was referred to me for evaluation of neck swelling, history of lymphoma. Patient reports remote history of lymphoma many years ago.  She recalls that she received chemotherapy at that time.  Followed up with Dr. Jeb Levering who retired from our [place] [facility].  She was recently seen by PCP and has complained about her concerns of bilateral neck swelling for a while. She could not specify when she noticed this change. She reports feeling anxious as her initial symptoms which led to lymphoma diagnosis was neck LN swelling.   obtained patient's previous records in EMR by Dr.Choski. Patient has history of non hodgkin's lymphoma and s/p chemotherapy treatment. Her pathology was not available to me.   [place] thryoid done which showed left mid thyroid nodule, 2.2 cm x 2.1cm x 2.1cm.  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 Afirma GSC benign, ROM 4%.  Patient was referred to endocrinology for further management.   INTERVAL HISTORY [name] is a 85 y.o. female who has above history reviewed by me today presents for follow up visit for history of lymphoma. Patient reports doing well.  Denies any new complaints.Denies weight loss, fever, chills, fatigue, night sweats.   Review of Systems  Constitutional: Negative for chills, fever, malaise/fatigue and weight loss.  HENT: Negative for nosebleeds and sore throat.   Eyes: Negative for double vision, photophobia and redness.  Respiratory: Negative for cough, shortness of breath and wheezing.   Cardiovascular: Negative for chest pain,  palpitations, orthopnea and leg swelling.  Gastrointestinal: Negative for abdominal pain, blood in stool, nausea and vomiting.  Genitourinary: Negative for dysuria.  Musculoskeletal: Negative for back pain, myalgias and neck pain.  Skin: Negative for itching and rash.  Neurological: Negative for dizziness, tingling and tremors.  Endo/Heme/Allergies: Negative for environmental allergies. Does not bruise/bleed easily.  Psychiatric/Behavioral: Negative for depression and hallucinations.    MEDICAL HISTORY:  Past Medical History:  Diagnosis Date  . Afib ([place])   . Anxiety   . Anxiety   . Bronchitis    recent  . Cancer ([place])    Lymphoma   . Chronic kidney disease    ckd stage 3  . Congestive heart failure (CHF) ([place])   . Coronary artery disease    Status post stent placement in LAD  . Dysrhythmia   . GERD (gastroesophageal reflux disease)   . Hyperlipemia   . Hypertension   . Hypothyroidism   . Thyroid disease     SURGICAL HISTORY: Past Surgical History:  Procedure Laterality Date  . ABDOMINAL HYSTERECTOMY     complete  . CORONARY ANGIOPLASTY     stent  . CORONARY STENT PLACEMENT    . EXCISION OF ABDOMINAL WALL TUMOR Right 05/08/2018   Procedure: EXCISION OF ABDOMINAL WALL-RIGHT;  Surgeon: Herbert Pun, MD;  Location: [facility] ORS;  Service: General;  Laterality: Right;  . INCISIONAL HERNIA REPAIR N/A 05/08/2018   Procedure: LAPAROSCOPIC VS. OPEN INCISIONAL HERNIA REPAIR WITH MESH;  Surgeon: Herbert Pun, MD;  Location: [facility] ORS;  Service: General;  Laterality: N/A;  . VEIN SURGERY     Endovenous ablation of saphenous vein    SOCIAL  HISTORY: Social History   Socioeconomic History  . Marital status: Single    Spouse name: Not on file  . Number of children: Not on file  . Years of education: Not on file  . Highest education level: Not on file  Occupational History  . Occupation: retired  Scientific laboratory technician  . Financial resource strain: Not on file  . Food  insecurity    Worry: Not on file    Inability: Not on file  . Transportation needs    Medical: Not on file    Non-medical: Not on file  Tobacco Use  . Smoking status: Never Smoker  . Smokeless tobacco: Never Used  Substance and Sexual Activity  . Alcohol use: No  . Drug use: No  . Sexual activity: Not on file  Lifestyle  . Physical activity    Days per week: Not on file    Minutes per session: Not on file  . Stress: Not on file  Relationships  . Social Herbalist on phone: Not on file    Gets together: Not on file    Attends religious service: Not on file    Active member of club or organization: Not on file    Attends meetings of clubs or organizations: Not on file    Relationship status: Not on file  . Intimate partner violence    Fear of current or ex partner: Not on file    Emotionally abused: Not on file    Physically abused: Not on file    Forced sexual activity: Not on file  Other Topics Concern  . Not on file  Social History Narrative   Lives at home independently. Has a cane to ambulate occasionally. Continues to drive.    FAMILY HISTORY: Family History  Problem Relation Age of Onset  . Heart Problems Mother   . Heart Problems Father   . Heart attack Father   . Diabetes Brother   . Diabetes Brother     ALLERGIES:  is allergic to sulfa antibiotics.  MEDICATIONS:  Current Outpatient Medications  Medication Sig Dispense Refill  . albuterol (PROVENTIL HFA;VENTOLIN HFA) 108 (90 Base) MCG/ACT inhaler Inhale 2 puffs into the lungs every 6 (six) hours as needed for wheezing or shortness of breath. 1 Inhaler 0  . allopurinol (ZYLOPRIM) 100 MG tablet Take 100 mg by mouth 2 (two) times daily.   1  . fluticasone (FLONASE) 50 MCG/ACT nasal spray Place 1 spray into the nose daily as needed for allergies.     Marland Kitchen HYDROcodone-acetaminophen (NORCO/VICODIN) 5-325 MG tablet Take 1 tablet by mouth every 6 (six) hours as needed for moderate pain.    Marland Kitchen levothyroxine  (SYNTHROID, LEVOTHROID) 50 MCG tablet Take 50 mcg by mouth daily before breakfast.    . lovastatin (MEVACOR) 40 MG tablet Take 40 mg by mouth at bedtime.     . montelukast (SINGULAIR) 10 MG tablet TAKE 1 TABLET BY MOUTH EVERY DAY AT NIGHT    . nitroGLYCERIN (NITROSTAT) 0.4 MG SL tablet Place 0.4 mg under the tongue every 5 (five) minutes x 3 doses as needed for chest pain.     Marland Kitchen ondansetron (ZOFRAN-ODT) 4 MG disintegrating tablet Take 4 mg by mouth every 8 (eight) hours as needed for nausea or vomiting.   0  . potassium chloride (K-DUR,KLOR-CON) 10 MEQ tablet Take 10 mEq by mouth daily.     Marland Kitchen rOPINIRole (REQUIP) 2 MG tablet Take 2 mg by mouth 2 (two) times daily.    Marland Kitchen  traMADol (ULTRAM) 50 MG tablet Take 50 mg by mouth 2 (two) times daily as needed for moderate pain.   3  . aspirin EC 81 MG tablet Take 81 mg by mouth daily.    . benzonatate (TESSALON) 200 MG capsule Take 200 mg by mouth 3 (three) times daily as needed for cough.    . chlorpheniramine-HYDROcodone (TUSSIONEX) 10-8 MG/5ML SUER TAKE 5 MLS BY MOUTH NIGHTLY AS NEEDED FOR COUGH    . cyclobenzaprine (FLEXERIL) 5 MG tablet Take 5 mg by mouth 3 (three) times daily as needed for muscle spasms.     . famotidine (PEPCID) 20 MG tablet Take 20 mg by mouth 2 (two) times daily.  2  . furosemide (LASIX) 40 MG tablet Take 1 tablet (40 mg total) by mouth daily. 30 tablet 1  . furosemide (LASIX) 40 MG tablet     . magic mouthwash w/lidocaine SOLN Take 5 mLs by mouth 3 (three) times daily as needed for mouth pain.    . predniSONE (DELTASONE) 5 MG tablet     . predniSONE (STERAPRED UNI-PAK 21 TAB) 10 MG (21) TBPK tablet Take 10-60 mg by mouth See admin instructions. (Typical regimens for 21 tablet dose packs of Methylprednisolone 4mg , Prednisone 5mg , and Prednisone 10mg ) Day 1: 2 tabs before breakfast, 1 tab after lunch, 1 tab after supper, and 2 tabs at bedtime. Day 2: 1 tab before breakfast, 1 tab after lunch, 1 tab after supper, and 2 tabs at bedtime.  Day 3: 1 tab before breakfast, 1 tab after lunch, 1 tab after supper, and 1 tab at bedtime. Day 4: 1 tab before breakfast, 1 tab after lunch, and 1 tab at bedtime. Day 5: 1 tab before breakfast and 1 tab at bedtime. Day 6: 1 tab before breakfast.    . tiZANidine (ZANAFLEX) 2 MG tablet TAKE 1 TABLET (2 MG TOTAL) BY MOUTH 3 (THREE) TIMES DAILY AS NEEDED     No current facility-administered medications for this visit.      PHYSICAL EXAMINATION: ECOG PERFORMANCE STATUS: 1 - Symptomatic but completely ambulatory Vitals:   08/18/18 1343  BP: 108/62  Pulse: 69  Resp: 18  Temp: 99.1 F (37.3 C)   Filed Weights   08/18/18 1343  Weight: 160 lb (72.6 kg)    Physical Exam Constitutional:      General: She is not in acute distress. HENT:     Head: Normocephalic and atraumatic.  Eyes:     General: No scleral icterus.    Pupils: Pupils are equal, round, and reactive to light.  Neck:     Musculoskeletal: Normal range of motion and neck supple.     Comments: No discrete palpable lymph node.   Cardiovascular:     Rate and Rhythm: Normal rate and regular rhythm.     Heart sounds: Normal heart sounds.  Pulmonary:     Effort: Pulmonary effort is normal. No respiratory distress.     Breath sounds: No wheezing.  Abdominal:     General: Bowel sounds are normal. There is no distension.     Palpations: Abdomen is soft. There is no mass.     Tenderness: There is no abdominal tenderness.  Musculoskeletal: Normal range of motion.        General: No deformity.  Skin:    General: Skin is warm and dry.     Findings: No erythema or rash.  Neurological:     Mental Status: She is alert and oriented to person, place, and time.  Cranial Nerves: No cranial nerve deficit.     Coordination: Coordination normal.  Psychiatric:        Behavior: Behavior normal.        Thought Content: Thought content normal.      LABORATORY DATA:  I have reviewed the data as listed Lab Results  Component  Value Date   WBC 6.1 08/18/2018   HGB 12.5 08/18/2018   HCT 38.0 08/18/2018   MCV 95.0 08/18/2018   PLT 227 08/18/2018   Recent Labs    12/01/17 1133 04/28/18 1402 05/08/18 1517 05/09/18 0048 08/18/18 1317  NA 137  --  138  --  141  K 3.1* 3.9 3.6  --  4.5  CL 100  --  101  --  105  CO2 30  --  25  --  28  GLUCOSE 88  --  162*  --  102*  BUN 21  --  26*  --  25*  CREATININE 1.14*  --  1.19* 1.28* 1.16*  CALCIUM 9.1  --  8.5*  --  9.0  GFRNONAA 44*  --  42* 39* 44*  GFRAA 50*  --  49* 45* 50*  PROT 7.4  --   --   --  7.0  ALBUMIN 4.0  --   --   --  4.0  AST 24  --   --   --  20  ALT 17  --   --   --  16  ALKPHOS 77  --   --   --  79  BILITOT 0.8  --   --   --  0.7   RADIOGRAPHIC STUDIES: I have personally reviewed the radiological images as listed and agreed with the findings in the report. 12/08/2017 US thyroid Left mid thyroid nodule (labeled 1) meets criteria for biopsy, as designated by the newly established ACR TI-RADS criteria, and referral for biopsy is recommended.   ASSESSMENT & PLAN:  1. History of lymphoma    History of lymphoma.  Labs reviewed and discussed with patient. Normal LDH.  No constitutional signs. Follow up in 6 months.   Thyroid nodule, biopsy consistent with benign follicular neoplasm.  Patient has establish care with Dr. Gabriel Carina.    Orders Placed This Encounter  Procedures  . CBC with Differential/Platelet    Standing Status:   Future    Standing Expiration Date:   08/18/2019  . Comprehensive metabolic panel    Standing Status:   Future    Standing Expiration Date:   08/18/2019  . Lactate dehydrogenase    Standing Status:   Future    Standing Expiration Date:   08/18/2019    We spent sufficient time to discuss many aspect of care, questions were answered to patient's satisfaction. Total face to face encounter time for this patient visit was 15 min. >50% of the time was  spent in counseling and coordination of care.  Earlie Server, MD,  PhD Hematology Oncology [facility] at [facility] Pager- [phone] 08/20/2018

## 2018-11-25 DIAGNOSIS — I999 Unspecified disorder of circulatory system: Secondary | ICD-10-CM | POA: Insufficient documentation

## 2019-02-05 ENCOUNTER — Ambulatory Visit (INDEPENDENT_AMBULATORY_CARE_PROVIDER_SITE_OTHER): Payer: Medicare Other | Admitting: Nurse Practitioner

## 2019-02-05 ENCOUNTER — Other Ambulatory Visit: Payer: Self-pay

## 2019-02-05 ENCOUNTER — Encounter (INDEPENDENT_AMBULATORY_CARE_PROVIDER_SITE_OTHER): Payer: Self-pay | Admitting: Nurse Practitioner

## 2019-02-05 VITALS — BP 128/79 | HR 90 | Resp 18 | Ht 61.0 in | Wt 161.0 lb

## 2019-02-05 DIAGNOSIS — M79605 Pain in left leg: Secondary | ICD-10-CM | POA: Diagnosis not present

## 2019-02-05 DIAGNOSIS — R600 Localized edema: Secondary | ICD-10-CM | POA: Diagnosis not present

## 2019-02-05 DIAGNOSIS — I83812 Varicose veins of left lower extremities with pain: Secondary | ICD-10-CM

## 2019-02-10 ENCOUNTER — Encounter (INDEPENDENT_AMBULATORY_CARE_PROVIDER_SITE_OTHER): Payer: Self-pay | Admitting: Nurse Practitioner

## 2019-02-10 NOTE — Progress Notes (Signed)
SUBJECTIVE:  Patient ID: [name], female    DOB: [birth year], 85 y.o.   MRN: [id number] Chief Complaint  Patient presents with  . Follow-up    referral varicose veins    HPI  [name] is a 85 y.o. female with complaint of leg swelling and leg pain.  The patient states his pain has been ongoing for the last 2 months.  She states that the pain has been in her feet it is also been painful to walk..  There is pain in her buttocks that seems to radiate down to her left leg.  The patient states that this is causing issues with her ability to sleep.  We saw the patient in office on 07/06/2018 for a similar issue.  The patient does have a previous history of sclerotherapy on her left lower extremity.  The patient had evidence of reflux in her left great saphenous vein, and at the time the patient decided to continue with conservative therapy.  The patient has recently been started on Neurontin and it makes her very drowsy.  The patient's daughter is present today and the patient has been sleeping on and off throughout the visit.  Past Medical History:  Diagnosis Date  . Afib ([place])   . Anxiety   . Anxiety   . Bronchitis    recent  . Cancer ([place])    Lymphoma   . Chronic kidney disease    ckd stage 3  . Congestive heart failure (CHF) ([place])   . Coronary artery disease    Status post stent placement in LAD  . Dysrhythmia   . GERD (gastroesophageal reflux disease)   . Hyperlipemia   . Hypertension   . Hypothyroidism   . Thyroid disease     Past Surgical History:  Procedure Laterality Date  . ABDOMINAL HYSTERECTOMY     complete  . CORONARY ANGIOPLASTY     stent  . CORONARY STENT PLACEMENT    . EXCISION OF ABDOMINAL WALL TUMOR Right 05/08/2018   Procedure: EXCISION OF ABDOMINAL WALL-RIGHT;  Surgeon: Herbert Pun, MD;  Location: [facility] ORS;  Service: General;  Laterality: Right;  . INCISIONAL HERNIA REPAIR N/A 05/08/2018   Procedure: LAPAROSCOPIC VS. OPEN INCISIONAL  HERNIA REPAIR WITH MESH;  Surgeon: Herbert Pun, MD;  Location: [facility] ORS;  Service: General;  Laterality: N/A;  . VEIN SURGERY     Endovenous ablation of saphenous vein    Social History   Socioeconomic History  . Marital status: Single    Spouse name: Not on file  . Number of children: Not on file  . Years of education: Not on file  . Highest education level: Not on file  Occupational History  . Occupation: retired  Tobacco Use  . Smoking status: Never Smoker  . Smokeless tobacco: Never Used  Substance and Sexual Activity  . Alcohol use: No  . Drug use: No  . Sexual activity: Not on file  Other Topics Concern  . Not on file  Social History Narrative   Lives at home independently. Has a cane to ambulate occasionally. Continues to drive.   Social Determinants of Health   Financial Resource Strain:   . Difficulty of Paying Living Expenses: Not on file  Food Insecurity:   . Worried About Charity fundraiser in the Last Year: Not on file  . Ran Out of Food in the Last Year: Not on file  Transportation Needs:   . Lack of Transportation (Medical): Not on file  .  Lack of Transportation (Non-Medical): Not on file  Physical Activity:   . Days of Exercise per Week: Not on file  . Minutes of Exercise per Session: Not on file  Stress:   . Feeling of Stress : Not on file  Social Connections:   . Frequency of Communication with Friends and Family: Not on file  . Frequency of Social Gatherings with Friends and Family: Not on file  . Attends Religious Services: Not on file  . Active Member of Clubs or Organizations: Not on file  . Attends Archivist Meetings: Not on file  . Marital Status: Not on file  Intimate Partner Violence:   . Fear of Current or Ex-Partner: Not on file  . Emotionally Abused: Not on file  . Physically Abused: Not on file  . Sexually Abused: Not on file    Family History  Problem Relation Age of Onset  . Heart Problems Mother   .  Heart Problems Father   . Heart attack Father   . Diabetes Brother   . Diabetes Brother     Allergies  Allergen Reactions  . Sulfa Antibiotics Hives and Rash     Review of Systems   Review of Systems: Negative Unless Checked Constitutional: [] Weight loss  [] Fever  [] Chills Cardiac: [] Chest pain   []  Atrial Fibrillation  [] Palpitations   [] Shortness of breath when laying flat   [] Shortness of breath with exertion. [] Shortness of breath at rest Vascular:  [x] Pain in legs with walking   [] Pain in legs with standing [] Pain in legs when laying flat   [x] Claudication    [] Pain in feet when laying flat    [] History of DVT   [] Phlebitis   [x] Swelling in legs   [x] Varicose veins   [] Non-healing ulcers Pulmonary:   [] Uses home oxygen   [] Productive cough   [] Hemoptysis   [] Wheeze  [] COPD   [] Asthma Neurologic:  [] Dizziness   [] Seizures  [] Blackouts [] History of stroke   [] History of TIA  [] Aphasia   [] Temporary Blindness   [] Weakness or numbness in arm   [] Weakness or numbness in leg Musculoskeletal:   [] Joint swelling   [] Joint pain   [] Low back pain  []  History of Knee Replacement [] Arthritis [] back Surgeries  []  Spinal Stenosis    Hematologic:  [] Easy bruising  [] Easy bleeding   [] Hypercoagulable state   [x] Anemic Gastrointestinal:  [] Diarrhea   [] Vomiting  [] Gastroesophageal reflux/heartburn   [] Difficulty swallowing. [] Abdominal pain Genitourinary:  [x] Chronic kidney disease   [] Difficult urination  [] Anuric   [] Blood in urine [] Frequent urination  [] Burning with urination   [] Hematuria Skin:  [] Rashes   [] Ulcers [] Wounds Psychological:  [] History of anxiety   [x]  History of major depression  [x]  Memory Difficulties      OBJECTIVE:   Physical Exam  BP 128/79 (BP Location: Left Arm)   Pulse 90   Resp 18   Ht 5\' 1"  (1.549 m)   Wt 161 lb (73 kg)   BMI 30.42 kg/m   Gen: WD/WN, NAD Head: [place]/AT, No temporalis wasting.  Ear/Nose/Throat: Hearing grossly intact, nares w/o erythema or  drainage Eyes: PER, EOMI, sclera nonicteric.  Neck: Supple, no masses.  No JVD.  Pulmonary:  Good air movement, no use of accessory muscles.  Cardiac: RRR Vascular:  Large varicosities on the left medial surface, extending from her mid calf to her ankle.  Scattered smaller varicosities. Vessel Right Left  Radial Palpable Palpable  Dorsalis Pedis Not Palpable Not Palpable  Posterior Tibial Palpable Palpable  Gastrointestinal: soft, non-distended. No guarding/no peritoneal signs.  Musculoskeletal: M/S 5/5 throughout.  No deformity or atrophy.  Neurologic: Pain and light touch intact in extremities.  Symmetrical.  Speech is fluent. Motor exam as listed above. Psychiatric: Judgment intact, Mood & affect appropriate for pt's clinical situation. Dermatologic: No Venous rashes. No Ulcers Noted.  No changes consistent with cellulitis. Lymph : No Cervical lymphadenopathy, no lichenification or skin changes of chronic lymphedema.       ASSESSMENT AND PLAN:  1. Varicose veins of leg with pain, left Patient is advised to continue with conservative therapy outlined below.  2. Edema leg Patient is advised to continue with conservative therapy such as wearing medical grade 1 compression stockings, elevating her lower extremity as much as possible as well as exercising at least 30 minutes daily.  3. Left leg pain  Recommend:  The patient has atypical pain symptoms for pure atherosclerotic disease. However, on physical exam there is evidence of mixed venous and arterial disease, given the diminished pulses and the edema associated with venous changes of the legs.  Noninvasive studies including ABI's and venous ultrasound of the legs will be obtained and the patient will follow up with me to review these studies.  I suspect the patient is c/o pseudoclaudication.  Patient should have an evaluation of his LS spine which I defer to the primary service.  The patient should continue walking and begin  a more formal exercise program. The patient should continue his antiplatelet therapy and aggressive treatment of the lipid abnormalities.  The patient should begin wearing graduated compression socks 15-20 mmHg strength to control edema.    Current Outpatient Medications on File Prior to Visit  Medication Sig Dispense Refill  . albuterol (PROVENTIL HFA;VENTOLIN HFA) 108 (90 Base) MCG/ACT inhaler Inhale 2 puffs into the lungs every 6 (six) hours as needed for wheezing or shortness of breath. 1 Inhaler 0  . allopurinol (ZYLOPRIM) 100 MG tablet Take 100 mg by mouth 2 (two) times daily.   1  . cyclobenzaprine (FLEXERIL) 5 MG tablet Take 5 mg by mouth 3 (three) times daily as needed for muscle spasms.     . famotidine (PEPCID) 20 MG tablet Take 20 mg by mouth 2 (two) times daily.  2  . fluticasone (FLONASE) 50 MCG/ACT nasal spray Place 1 spray into the nose daily as needed for allergies.     . furosemide (LASIX) 40 MG tablet     . HYDROcodone-acetaminophen (NORCO/VICODIN) 5-325 MG tablet Take 1 tablet by mouth every 6 (six) hours as needed for moderate pain.    Marland Kitchen levothyroxine (SYNTHROID, LEVOTHROID) 50 MCG tablet Take 50 mcg by mouth daily before breakfast.    . lovastatin (MEVACOR) 40 MG tablet Take 40 mg by mouth at bedtime.     . montelukast (SINGULAIR) 10 MG tablet TAKE 1 TABLET BY MOUTH EVERY DAY AT NIGHT    . nitroGLYCERIN (NITROSTAT) 0.4 MG SL tablet Place 0.4 mg under the tongue every 5 (five) minutes x 3 doses as needed for chest pain.     . potassium chloride (K-DUR,KLOR-CON) 10 MEQ tablet Take 10 mEq by mouth daily.     Marland Kitchen rOPINIRole (REQUIP) 2 MG tablet Take 2 mg by mouth 2 (two) times daily.    Marland Kitchen tiZANidine (ZANAFLEX) 2 MG tablet TAKE 1 TABLET (2 MG TOTAL) BY MOUTH 3 (THREE) TIMES DAILY AS NEEDED    . traMADol (ULTRAM) 50 MG tablet Take 50 mg by mouth 2 (two) times daily as needed  for moderate pain.   3  . aspirin EC 81 MG tablet Take 81 mg by mouth daily.    . benzonatate (TESSALON)  200 MG capsule Take 200 mg by mouth 3 (three) times daily as needed for cough.    . chlorpheniramine-HYDROcodone (TUSSIONEX) 10-8 MG/5ML SUER TAKE 5 MLS BY MOUTH NIGHTLY AS NEEDED FOR COUGH    . furosemide (LASIX) 40 MG tablet Take 1 tablet (40 mg total) by mouth daily. 30 tablet 1  . magic mouthwash w/lidocaine SOLN Take 5 mLs by mouth 3 (three) times daily as needed for mouth pain.    Marland Kitchen ondansetron (ZOFRAN-ODT) 4 MG disintegrating tablet Take 4 mg by mouth every 8 (eight) hours as needed for nausea or vomiting.   0  . predniSONE (DELTASONE) 5 MG tablet     . predniSONE (STERAPRED UNI-PAK 21 TAB) 10 MG (21) TBPK tablet Take 10-60 mg by mouth See admin instructions. (Typical regimens for 21 tablet dose packs of Methylprednisolone 4mg , Prednisone 5mg , and Prednisone 10mg ) Day 1: 2 tabs before breakfast, 1 tab after lunch, 1 tab after supper, and 2 tabs at bedtime. Day 2: 1 tab before breakfast, 1 tab after lunch, 1 tab after supper, and 2 tabs at bedtime. Day 3: 1 tab before breakfast, 1 tab after lunch, 1 tab after supper, and 1 tab at bedtime. Day 4: 1 tab before breakfast, 1 tab after lunch, and 1 tab at bedtime. Day 5: 1 tab before breakfast and 1 tab at bedtime. Day 6: 1 tab before breakfast.     No current facility-administered medications on file prior to visit.    There are no Patient Instructions on file for this visit. No follow-ups on file.   Kris Hartmann, NP  This note was completed with Sales executive.  Any errors are purely unintentional.

## 2019-02-12 ENCOUNTER — Other Ambulatory Visit: Payer: Self-pay

## 2019-02-12 NOTE — Progress Notes (Signed)
Patient pre screened for office appointment, no questions or concerns today. Patient reminded of upcoming appointment time and date. Patient not available daughter gave information for chart.  Patient has fallen in the past month and sustained an injury to her ribs.

## 2019-02-15 ENCOUNTER — Inpatient Hospital Stay: Payer: Medicare Other | Admitting: Oncology

## 2019-02-15 ENCOUNTER — Other Ambulatory Visit: Payer: Self-pay

## 2019-02-15 ENCOUNTER — Inpatient Hospital Stay: Payer: Medicare Other

## 2019-03-01 ENCOUNTER — Other Ambulatory Visit: Payer: Self-pay

## 2019-03-01 ENCOUNTER — Emergency Department
Admission: EM | Admit: 2019-03-01 | Discharge: 2019-03-01 | Disposition: A | Discharge status: Home / Self Care | Payer: Medicare Other | Attending: Student in an Organized Health Care Education/Training Program | Admitting: Student in an Organized Health Care Education/Training Program

## 2019-03-01 ENCOUNTER — Emergency Department: Payer: Medicare Other

## 2019-03-01 DIAGNOSIS — I130 Hypertensive heart and chronic kidney disease with heart failure and stage 1 through stage 4 chronic kidney disease, or unspecified chronic kidney disease: Secondary | ICD-10-CM | POA: Diagnosis not present

## 2019-03-01 DIAGNOSIS — N183 Chronic kidney disease, stage 3 unspecified: Secondary | ICD-10-CM | POA: Insufficient documentation

## 2019-03-01 DIAGNOSIS — Z79899 Other long term (current) drug therapy: Secondary | ICD-10-CM | POA: Diagnosis not present

## 2019-03-01 DIAGNOSIS — E039 Hypothyroidism, unspecified: Secondary | ICD-10-CM | POA: Insufficient documentation

## 2019-03-01 DIAGNOSIS — J449 Chronic obstructive pulmonary disease, unspecified: Secondary | ICD-10-CM | POA: Insufficient documentation

## 2019-03-01 DIAGNOSIS — R0789 Other chest pain: Secondary | ICD-10-CM

## 2019-03-01 DIAGNOSIS — R5381 Other malaise: Secondary | ICD-10-CM | POA: Diagnosis not present

## 2019-03-01 DIAGNOSIS — I509 Heart failure, unspecified: Secondary | ICD-10-CM | POA: Insufficient documentation

## 2019-03-01 DIAGNOSIS — R079 Chest pain, unspecified: Secondary | ICD-10-CM | POA: Diagnosis present

## 2019-03-01 DIAGNOSIS — Z20822 Contact with and (suspected) exposure to covid-19: Secondary | ICD-10-CM | POA: Diagnosis not present

## 2019-03-01 DIAGNOSIS — Z7982 Long term (current) use of aspirin: Secondary | ICD-10-CM | POA: Diagnosis not present

## 2019-03-01 LAB — CBC
HCT: 39.8 % (ref 36.0–46.0)
Hemoglobin: 13.7 g/dL (ref 12.0–15.0)
MCH: 30.6 pg (ref 26.0–34.0)
MCHC: 34.4 g/dL (ref 30.0–36.0)
MCV: 89 fL (ref 80.0–100.0)
Platelets: 244 10*3/uL (ref 150–400)
RBC: 4.47 MIL/uL (ref 3.87–5.11)
RDW: 14.1 % (ref 11.5–15.5)
WBC: 7.2 10*3/uL (ref 4.0–10.5)
nRBC: 0 % (ref 0.0–0.2)

## 2019-03-01 LAB — BASIC METABOLIC PANEL
Anion gap: 10 (ref 5–15)
BUN: 21 mg/dL (ref 8–23)
CO2: 27 mmol/L (ref 22–32)
Calcium: 8.9 mg/dL (ref 8.9–10.3)
Chloride: 100 mmol/L (ref 98–111)
Creatinine, Ser: 1.2 mg/dL — ABNORMAL HIGH (ref 0.44–1.00)
GFR calc Af Amer: 48 mL/min — ABNORMAL LOW (ref >60)
GFR calc non Af Amer: 41 mL/min — ABNORMAL LOW (ref >60)
Glucose, Bld: 121 mg/dL — ABNORMAL HIGH (ref 70–99)
Potassium: 3.5 mmol/L (ref 3.5–5.1)
Sodium: 137 mmol/L (ref 135–145)

## 2019-03-01 LAB — SARS CORONAVIRUS 2 (TAT 6-24 HRS): SARS Coronavirus 2: NEGATIVE

## 2019-03-01 LAB — TROPONIN I (HIGH SENSITIVITY)
Troponin I (High Sensitivity): 5 ng/L (ref <18)
Troponin I (High Sensitivity): 5 ng/L (ref <18)

## 2019-03-01 MED ORDER — ALBUTEROL SULFATE HFA 108 (90 BASE) MCG/ACT IN AERS: 2.0000 | INHALATION_SPRAY | Freq: Four times a day (QID) | RESPIRATORY_TRACT | 0 refills | Status: DC | PRN

## 2019-03-01 NOTE — ED Provider Notes (Signed)
[facility] Emergency Department Provider Note    First MD Initiated Contact with Patient 03/01/19 1314     (approximate)  I have reviewed the triage vital signs and the nursing notes.   HISTORY  Chief Complaint Chest Pain    HPI [name] is a 86 y.o. female presents the ER for evaluation of intermittent chest discomfort and congestion.  Primary concern was that she had recent Covid exposure over Christmas break and was hoping to get tested.  States that she is having congestion at night worsening during the day.  Has had some generalized malaise as well.  No measured fevers.  Not having a cough.  Pain feels similar to when she fell and does have remote rib fractures when she fell the grocery store.  Does not have any lower extremity swelling.    Past Medical History:  Diagnosis Date  . Afib ([place])   . Anxiety   . Anxiety   . Bronchitis    recent  . Cancer ([place])    Lymphoma   . Chronic kidney disease    ckd stage 3  . Congestive heart failure (CHF) ([place])   . Coronary artery disease    Status post stent placement in LAD  . Dysrhythmia   . GERD (gastroesophageal reflux disease)   . Hyperlipemia   . Hypertension   . Hypothyroidism   . Thyroid disease    Family History  Problem Relation Age of Onset  . Heart Problems Mother   . Heart Problems Father   . Heart attack Father   . Diabetes Brother   . Diabetes Brother    Past Surgical History:  Procedure Laterality Date  . ABDOMINAL HYSTERECTOMY     complete  . CORONARY ANGIOPLASTY     stent  . CORONARY STENT PLACEMENT    . EXCISION OF ABDOMINAL WALL TUMOR Right 05/08/2018   Procedure: EXCISION OF ABDOMINAL WALL-RIGHT;  Surgeon: Herbert Pun, MD;  Location: [facility] ORS;  Service: General;  Laterality: Right;  . INCISIONAL HERNIA REPAIR N/A 05/08/2018   Procedure: LAPAROSCOPIC VS. OPEN INCISIONAL HERNIA REPAIR WITH MESH;  Surgeon: Herbert Pun, MD;  Location: [facility] ORS;   Service: General;  Laterality: N/A;  . VEIN SURGERY     Endovenous ablation of saphenous vein   Patient Active Problem List   Diagnosis Date Noted  . Vascular disease 11/25/2018  . Chronic obstructive pulmonary disease ([place]) 05/22/2018  . Incisional hernia 05/08/2018  . Left thyroid nodule 02/04/2018  . Gastro-esophageal reflux disease without esophagitis 07/24/2017  . Left carpal tunnel syndrome 03/29/2016  . Varicose veins of leg with pain, left 01/23/2016  . Gross hematuria 03/28/2015  . Nephrolithiasis 03/28/2015  . Atrial fibrillation ([place]) 03/28/2015  . Chronic kidney disease (CKD), stage III (moderate) 03/23/2015  . Pneumonia 02/10/2015  . Disease of thyroid gland 07/29/2014  . HLD (hyperlipidemia) 07/29/2014  . Clinical depression 07/29/2014  . Arteriosclerosis of coronary artery 07/29/2014  . A-fib ([place]) 07/29/2014  . Anxiety 07/29/2014  . Diverticulitis 07/23/2014  . Edema leg 07/08/2014  . Acute bronchitis due to infection 01/24/2014  . Lumbar canal stenosis 12/13/2013  . Neuritis or radiculitis due to rupture of lumbar intervertebral disc 12/13/2013      Prior to Admission medications   Medication Sig Start Date End Date Taking? Authorizing Provider  albuterol (VENTOLIN HFA) 108 (90 Base) MCG/ACT inhaler Inhale 2 puffs into the lungs every 6 (six) hours as needed for wheezing or shortness of breath. 03/01/19  Merlyn Lot, MD  allopurinol (ZYLOPRIM) 100 MG tablet Take 100 mg by mouth 2 (two) times daily.  09/09/17   [provider]  aspirin EC 81 MG tablet Take 81 mg by mouth daily.    [provider]  benzonatate (TESSALON) 200 MG capsule Take 200 mg by mouth 3 (three) times daily as needed for cough.    [provider]  chlorpheniramine-HYDROcodone (TUSSIONEX) 10-8 MG/5ML SUER TAKE 5 MLS BY MOUTH NIGHTLY AS NEEDED FOR COUGH 04/17/18   [provider]  cyclobenzaprine (FLEXERIL) 5 MG tablet Take 5 mg by mouth 3 (three) times  daily as needed for muscle spasms.  11/06/17   [provider]  famotidine (PEPCID) 20 MG tablet Take 20 mg by mouth 2 (two) times daily. 09/07/17   [provider]  fluticasone (FLONASE) 50 MCG/ACT nasal spray Place 1 spray into the nose daily as needed for allergies.  02/16/15   [provider]  furosemide (LASIX) 40 MG tablet Take 1 tablet (40 mg total) by mouth daily. 07/28/14 02/12/19  Jeanie Cooks, MD  furosemide (LASIX) 40 MG tablet  06/05/18   [provider]  HYDROcodone-acetaminophen (NORCO/VICODIN) 5-325 MG tablet Take 1 tablet by mouth every 6 (six) hours as needed for moderate pain.    [provider]  levothyroxine (SYNTHROID, LEVOTHROID) 50 MCG tablet Take 50 mcg by mouth daily before breakfast.    [provider]  lovastatin (MEVACOR) 40 MG tablet Take 40 mg by mouth at bedtime.  03/01/15   [provider]  magic mouthwash w/lidocaine SOLN Take 5 mLs by mouth 3 (three) times daily as needed for mouth pain.    [provider]  montelukast (SINGULAIR) 10 MG tablet TAKE 1 TABLET BY MOUTH EVERY DAY AT NIGHT 05/03/18   [provider]  nitroGLYCERIN (NITROSTAT) 0.4 MG SL tablet Place 0.4 mg under the tongue every 5 (five) minutes x 3 doses as needed for chest pain.     [provider]  ondansetron (ZOFRAN-ODT) 4 MG disintegrating tablet Take 4 mg by mouth every 8 (eight) hours as needed for nausea or vomiting.  01/03/18   [provider]  potassium chloride (K-DUR,KLOR-CON) 10 MEQ tablet Take 10 mEq by mouth daily.  05/23/14   [provider]  predniSONE (DELTASONE) 5 MG tablet  06/08/18   [provider]  rOPINIRole (REQUIP) 2 MG tablet Take 2 mg by mouth 2 (two) times daily.    [provider]  tiZANidine (ZANAFLEX) 2 MG tablet TAKE 1 TABLET (2 MG TOTAL) BY MOUTH 3 (THREE) TIMES DAILY AS NEEDED 06/25/18   [provider]  traMADol (ULTRAM) 50 MG tablet Take 50 mg by  mouth 2 (two) times daily as needed for moderate pain.  02/14/15   [provider]    Allergies Sulfa antibiotics    Social History Social History   Tobacco Use  . Smoking status: Never Smoker  . Smokeless tobacco: Never Used  Substance Use Topics  . Alcohol use: No  . Drug use: No    Review of Systems Patient denies headaches, rhinorrhea, blurry vision, numbness, shortness of breath, chest pain, edema, cough, abdominal pain, nausea, vomiting, diarrhea, dysuria, fevers, rashes or hallucinations unless otherwise stated above in HPI. ____________________________________________   PHYSICAL EXAM:  VITAL SIGNS: Vitals:   03/01/19 1126  BP: (!) 127/49  Pulse: 82  Resp: 18  Temp: 98.6 F (37 C)  SpO2: 97%    Constitutional: Alert and oriented.  Eyes: Conjunctivae are normal.  Head: Atraumatic. Nose: No congestion/rhinnorhea. Mouth/Throat: Mucous membranes are moist.   Neck: No stridor. Painless ROM.  Cardiovascular: Normal rate, regular rhythm. Grossly normal heart sounds.  Good peripheral circulation. Respiratory: Normal respiratory effort.  No retractions. Lungs CTAB. Gastrointestinal: Soft and nontender. No distention. No abdominal bruits. No CVA tenderness. Genitourinary:  Musculoskeletal: No lower extremity tenderness nor edema.  No joint effusions. Neurologic:  Normal speech and language. No gross focal neurologic deficits are appreciated. No facial droop Skin:  Skin is warm, dry and intact. No rash noted. Psychiatric: Mood and affect are normal. Speech and behavior are normal.  ____________________________________________   LABS (all labs ordered are listed, but only abnormal results are displayed)  Results for orders placed or performed during the hospital encounter of 03/01/19 (from the past 24 hour(s))  Basic metabolic panel     Status: Abnormal   Collection Time: 03/01/19 11:30 AM  Result Value Ref Range   Sodium 137 135 - 145 mmol/L    Potassium 3.5 3.5 - 5.1 mmol/L   Chloride 100 98 - 111 mmol/L   CO2 27 22 - 32 mmol/L   Glucose, Bld 121 (H) 70 - 99 mg/dL   BUN 21 8 - 23 mg/dL   Creatinine, Ser 1.20 (H) 0.44 - 1.00 mg/dL   Calcium 8.9 8.9 - 10.3 mg/dL   GFR calc non Af Amer 41 (L) >60 mL/min   GFR calc Af Amer 48 (L) >60 mL/min   Anion gap 10 5 - 15  CBC     Status: None   Collection Time: 03/01/19 11:30 AM  Result Value Ref Range   WBC 7.2 4.0 - 10.5 K/uL   RBC 4.47 3.87 - 5.11 MIL/uL   Hemoglobin 13.7 12.0 - 15.0 g/dL   HCT 39.8 36.0 - 46.0 %   MCV 89.0 80.0 - 100.0 fL   MCH 30.6 26.0 - 34.0 pg   MCHC 34.4 30.0 - 36.0 g/dL   RDW 14.1 11.5 - 15.5 %   Platelets 244 150 - 400 K/uL   nRBC 0.0 0.0 - 0.2 %  Troponin I (High Sensitivity)     Status: None   Collection Time: 03/01/19 11:30 AM  Result Value Ref Range   Troponin I (High Sensitivity) 5 <18 ng/L  Troponin I (High Sensitivity)     Status: None   Collection Time: 03/01/19  1:28 PM  Result Value Ref Range   Troponin I (High Sensitivity) 5 <18 ng/L   ____________________________________________  EKG My review and personal interpretation at Time: 11:23   Indication: chest pain  Rate: 80  Rhythm: sinus Axis: normal Other: normal intervals, no stemi ____________________________________________  RADIOLOGY  I personally reviewed all radiographic images ordered to evaluate for the above acute complaints and reviewed radiology reports and findings.  These findings were personally discussed with the patient.  Please see medical record for radiology report.  ____________________________________________   PROCEDURES  Procedure(s) performed:  Procedures    Critical Care performed: no ____________________________________________   INITIAL IMPRESSION / ASSESSMENT AND PLAN / ED COURSE  Pertinent labs & imaging results that were available during my care of the patient were reviewed by me and considered in my medical decision making (see chart for  details).   DDX: covid 19, pna, ptx, chf, acs, dissection, pleurisy, pe  [name] is a 86 y.o. who presents to the ED with symptoms as described above and concern for Covid exposure.  She is exceedingly well-appearing presumably  stated age.  EKG nonischemic initial troponin nonischemic.  No evidence of infiltrates.  No hypoxia.  Abdominal exam is soft and benign.  Doubt dissection, PE, or intra-abdominal source of discomfort.  Will observe for serial enzyme.  Clinical Course as of Feb 29 1452  Mon Mar 01, 2019  1441 Cardiac enzymes are stable.  At this point believe patient stable and appropriate for outpatient follow-up.   [PR]    Clinical Course User Index [PR] Merlyn Lot, MD    The patient was evaluated in Emergency Department today for the symptoms described in the history of present illness. He/she was evaluated in the context of the global COVID-19 pandemic, which necessitated consideration that the patient might be at risk for infection with the SARS-CoV-2 virus that causes COVID-19. Institutional protocols and algorithms that pertain to the evaluation of patients at risk for COVID-19 are in a state of rapid change based on information released by regulatory bodies including the CDC and federal and state organizations. These policies and algorithms were followed during the patient's care in the ED.  As part of my medical decision making, I reviewed the following data within the [place] notes reviewed and incorporated, Labs reviewed, notes from prior ED visits and [place] Controlled Substance Database   ____________________________________________   FINAL CLINICAL IMPRESSION(S) / ED DIAGNOSES  Final diagnoses:  Atypical chest pain      NEW MEDICATIONS STARTED DURING THIS VISIT:  Current Discharge Medication List       Note:  This document was prepared using Dragon voice recognition software and may include unintentional dictation  errors.    Merlyn Lot, MD 03/01/19 [phone]

## 2019-03-01 NOTE — ED Notes (Signed)
Topaz pad not working at this time. Pt understands d/c instructions.

## 2019-03-01 NOTE — ED Triage Notes (Signed)
Pt states that she fell dec 10th and cracked a rib on her right side, pt states that she ha been having congestion in her chest for 2 months, and that it is worse at night, pt is wearing a lidoderm patch on her center of her chest. Pt was also told last night that she had been exposed to covid on christmas day but that she wasn't anywhere near them, pt states that her sister told her she should come in and get checked.

## 2019-03-08 ENCOUNTER — Encounter: Payer: Self-pay | Admitting: Oncology

## 2019-03-08 ENCOUNTER — Inpatient Hospital Stay (HOSPITAL_BASED_OUTPATIENT_CLINIC_OR_DEPARTMENT_OTHER): Payer: Medicare Other | Admitting: Oncology

## 2019-03-08 ENCOUNTER — Other Ambulatory Visit: Payer: Self-pay

## 2019-03-08 ENCOUNTER — Inpatient Hospital Stay: Payer: Medicare Other | Attending: Oncology

## 2019-03-08 VITALS — BP 140/74 | HR 82 | Temp 97.1°F | Resp 18 | Wt 162.8 lb

## 2019-03-08 DIAGNOSIS — R0989 Other specified symptoms and signs involving the circulatory and respiratory systems: Secondary | ICD-10-CM | POA: Insufficient documentation

## 2019-03-08 DIAGNOSIS — E039 Hypothyroidism, unspecified: Secondary | ICD-10-CM

## 2019-03-08 DIAGNOSIS — Z8579 Personal history of other malignant neoplasms of lymphoid, hematopoietic and related tissues: Secondary | ICD-10-CM

## 2019-03-08 DIAGNOSIS — Z8572 Personal history of non-Hodgkin lymphomas: Secondary | ICD-10-CM

## 2019-03-08 DIAGNOSIS — E041 Nontoxic single thyroid nodule: Secondary | ICD-10-CM | POA: Diagnosis not present

## 2019-03-08 LAB — COMPREHENSIVE METABOLIC PANEL
ALT: 19 U/L (ref 0–44)
AST: 23 U/L (ref 15–41)
Albumin: 3.9 g/dL (ref 3.5–5.0)
Alkaline Phosphatase: 80 U/L (ref 38–126)
Anion gap: 8 (ref 5–15)
BUN: 22 mg/dL (ref 8–23)
CO2: 27 mmol/L (ref 22–32)
Calcium: 9 mg/dL (ref 8.9–10.3)
Chloride: 101 mmol/L (ref 98–111)
Creatinine, Ser: 1.1 mg/dL — ABNORMAL HIGH (ref 0.44–1.00)
GFR calc Af Amer: 53 mL/min — ABNORMAL LOW (ref >60)
GFR calc non Af Amer: 46 mL/min — ABNORMAL LOW (ref >60)
Glucose, Bld: 109 mg/dL — ABNORMAL HIGH (ref 70–99)
Potassium: 4.3 mmol/L (ref 3.5–5.1)
Sodium: 136 mmol/L (ref 135–145)
Total Bilirubin: 0.6 mg/dL (ref 0.3–1.2)
Total Protein: 7.2 g/dL (ref 6.5–8.1)

## 2019-03-08 LAB — CBC WITH DIFFERENTIAL/PLATELET
Abs Immature Granulocytes: 0.02 10*3/uL (ref 0.00–0.07)
Basophils Absolute: 0 10*3/uL (ref 0.0–0.1)
Basophils Relative: 1 %
Eosinophils Absolute: 0.2 10*3/uL (ref 0.0–0.5)
Eosinophils Relative: 3 %
HCT: 39.4 % (ref 36.0–46.0)
Hemoglobin: 12.5 g/dL (ref 12.0–15.0)
Immature Granulocytes: 0 %
Lymphocytes Relative: 32 %
Lymphs Abs: 2.1 10*3/uL (ref 0.7–4.0)
MCH: 30.3 pg (ref 26.0–34.0)
MCHC: 31.7 g/dL (ref 30.0–36.0)
MCV: 95.6 fL (ref 80.0–100.0)
Monocytes Absolute: 0.9 10*3/uL (ref 0.1–1.0)
Monocytes Relative: 14 %
Neutro Abs: 3.2 10*3/uL (ref 1.7–7.7)
Neutrophils Relative %: 50 %
Platelets: 227 10*3/uL (ref 150–400)
RBC: 4.12 MIL/uL (ref 3.87–5.11)
RDW: 13.9 % (ref 11.5–15.5)
WBC: 6.4 10*3/uL (ref 4.0–10.5)
nRBC: 0 % (ref 0.0–0.2)

## 2019-03-08 LAB — LACTATE DEHYDROGENASE: LDH: 187 U/L (ref 98–192)

## 2019-03-08 NOTE — Progress Notes (Signed)
Hematology/Oncology follow up note [facility] [phone] [phone]   Patient Care Team: Tracie Harrier, MD as PCP - General (Internal Medicine)  REFERRING PROVIDER: Dr. Ginette Pitman CHIEF COMPLAINTS/REASON FOR VISIT:  Evaluation of neck swelling, history of lymphoma.  HISTORY OF PRESENTING ILLNESS:  [name] is a  86 y.o.  female with PMH listed below who was referred to me for evaluation of neck swelling, history of lymphoma. Patient reports remote history of lymphoma many years ago.  She recalls that she received chemotherapy at that time.  Followed up with Dr. Jeb Levering who retired from our [place] center.  She was recently seen by PCP and has complained about her concerns of bilateral neck swelling for a while. She could not specify when she noticed this change. She reports feeling anxious as her initial symptoms which led to lymphoma diagnosis was neck LN swelling.   obtained patient's previous records in EMR by Dr.Choski. Patient has history of non hodgkin's lymphoma and s/p chemotherapy treatment. Her pathology was not available to me.   [place] thryoid done which showed left mid thyroid nodule, 2.2 cm x 2.1cm x 2.1cm.  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 Afirma GSC benign, ROM 4%.  Patient was referred to endocrinology for further management.   INTERVAL HISTORY [name] is a 86 y.o. female who has above history reviewed by me today presents for follow up visit for history of lymphoma. Patient reports feeling congested, wheezing, chronic cough for the past 2 to 3 months She went to emergency room from 03/01/2019 due to intermittent chest discomfort and congestion.  Patient had Covid testing done which was negative. Chest x-ray showed no infiltrate.  Service cardiac enzyme stable.  Patient was discharged home for outpatient follow-up. Patient denies any fever, chills, unintentional weight loss, night sweats.  Review  of Systems  Constitutional: Negative for chills, fever, malaise/fatigue and weight loss.  HENT: Negative for nosebleeds and sore throat.   Eyes: Negative for double vision, photophobia and redness.  Respiratory: Negative for cough, shortness of breath and wheezing.   Cardiovascular: Negative for chest pain and palpitations.       Congested  Gastrointestinal: Negative for abdominal pain, blood in stool, nausea and vomiting.  Genitourinary: Negative for dysuria.  Musculoskeletal: Negative for back pain, myalgias and neck pain.  Skin: Negative for itching and rash.  Neurological: Negative for dizziness, tingling and tremors.  Endo/Heme/Allergies: Negative for environmental allergies. Does not bruise/bleed easily.  Psychiatric/Behavioral: Negative for depression and hallucinations.    MEDICAL HISTORY:  Past Medical History:  Diagnosis Date  . Afib ([place])   . Anxiety   . Anxiety   . Bronchitis    recent  . Cancer ([place])    Lymphoma   . Chronic kidney disease    ckd stage 3  . Congestive heart failure (CHF) ([place])   . Coronary artery disease    Status post stent placement in LAD  . Dysrhythmia   . GERD (gastroesophageal reflux disease)   . Hyperlipemia   . Hypertension   . Hypothyroidism   . Thyroid disease     SURGICAL HISTORY: Past Surgical History:  Procedure Laterality Date  . ABDOMINAL HYSTERECTOMY     complete  . CORONARY ANGIOPLASTY     stent  . CORONARY STENT PLACEMENT    . EXCISION OF ABDOMINAL WALL TUMOR Right 05/08/2018   Procedure: EXCISION OF ABDOMINAL WALL-RIGHT;  Surgeon: Herbert Pun, MD;  Location: [facility] ORS;  Service: General;  Laterality: Right;  . INCISIONAL HERNIA REPAIR N/A 05/08/2018   Procedure: LAPAROSCOPIC VS. OPEN INCISIONAL HERNIA REPAIR WITH MESH;  Surgeon: Herbert Pun, MD;  Location: [facility] ORS;  Service: General;  Laterality: N/A;  . VEIN SURGERY     Endovenous ablation of saphenous vein    SOCIAL HISTORY: Social History    Socioeconomic History  . Marital status: Single    Spouse name: Not on file  . Number of children: Not on file  . Years of education: Not on file  . Highest education level: Not on file  Occupational History  . Occupation: retired  Tobacco Use  . Smoking status: Never Smoker  . Smokeless tobacco: Never Used  Substance and Sexual Activity  . Alcohol use: No  . Drug use: No  . Sexual activity: Not on file  Other Topics Concern  . Not on file  Social History Narrative   Lives at home independently. Has a cane to ambulate occasionally. Continues to drive.   Social Determinants of Health   Financial Resource Strain:   . Difficulty of Paying Living Expenses: Not on file  Food Insecurity:   . Worried About Charity fundraiser in the Last Year: Not on file  . Ran Out of Food in the Last Year: Not on file  Transportation Needs:   . Lack of Transportation (Medical): Not on file  . Lack of Transportation (Non-Medical): Not on file  Physical Activity:   . Days of Exercise per Week: Not on file  . Minutes of Exercise per Session: Not on file  Stress:   . Feeling of Stress : Not on file  Social Connections:   . Frequency of Communication with Friends and Family: Not on file  . Frequency of Social Gatherings with Friends and Family: Not on file  . Attends Religious Services: Not on file  . Active Member of Clubs or Organizations: Not on file  . Attends Archivist Meetings: Not on file  . Marital Status: Not on file  Intimate Partner Violence:   . Fear of Current or Ex-Partner: Not on file  . Emotionally Abused: Not on file  . Physically Abused: Not on file  . Sexually Abused: Not on file    FAMILY HISTORY: Family History  Problem Relation Age of Onset  . Heart Problems Mother   . Heart Problems Father   . Heart attack Father   . Diabetes Brother   . Diabetes Brother     ALLERGIES:  is allergic to sulfa antibiotics.  MEDICATIONS:  Current Outpatient  Medications  Medication Sig Dispense Refill  . albuterol (VENTOLIN HFA) 108 (90 Base) MCG/ACT inhaler Inhale 2 puffs into the lungs every 6 (six) hours as needed for wheezing or shortness of breath. 8 g 0  . allopurinol (ZYLOPRIM) 100 MG tablet Take 100 mg by mouth 2 (two) times daily.   1  . cyclobenzaprine (FLEXERIL) 5 MG tablet Take 5 mg by mouth 3 (three) times daily as needed for muscle spasms.     . famotidine (PEPCID) 20 MG tablet Take 20 mg by mouth 2 (two) times daily.  2  . fluticasone (FLONASE) 50 MCG/ACT nasal spray Place 1 spray into the nose daily as needed for allergies.     . furosemide (LASIX) 40 MG tablet     . gabapentin (NEURONTIN) 100 MG capsule Take by mouth.    [place] Kitchen HYDROcodone-acetaminophen (NORCO/VICODIN) 5-325 MG tablet Take 1 tablet by mouth every 6 (six) hours as needed for  moderate pain.    [place] Kitchen levothyroxine (SYNTHROID, LEVOTHROID) 50 MCG tablet Take 50 mcg by mouth daily before breakfast.    . lovastatin (MEVACOR) 40 MG tablet Take 40 mg by mouth at bedtime.     . magic mouthwash w/lidocaine SOLN Take 5 mLs by mouth 3 (three) times daily as needed for mouth pain.    . montelukast (SINGULAIR) 10 MG tablet TAKE 1 TABLET BY MOUTH EVERY DAY AT NIGHT    . ondansetron (ZOFRAN-ODT) 4 MG disintegrating tablet Take 4 mg by mouth every 8 (eight) hours as needed for nausea or vomiting.   0  . potassium chloride (K-DUR,KLOR-CON) 10 MEQ tablet Take 10 mEq by mouth daily.     . predniSONE (DELTASONE) 5 MG tablet     . rOPINIRole (REQUIP) 2 MG tablet Take 2 mg by mouth 2 (two) times daily.    [place] Kitchen tiZANidine (ZANAFLEX) 2 MG tablet TAKE 1 TABLET (2 MG TOTAL) BY MOUTH 3 (THREE) TIMES DAILY AS NEEDED    . traMADol (ULTRAM) 50 MG tablet Take 50 mg by mouth 2 (two) times daily as needed for moderate pain.   3  . aspirin EC 81 MG tablet Take 81 mg by mouth daily.    . benzonatate (TESSALON) 200 MG capsule Take 200 mg by mouth 3 (three) times daily as needed for cough.    .  chlorpheniramine-HYDROcodone (TUSSIONEX) 10-8 MG/5ML SUER TAKE 5 MLS BY MOUTH NIGHTLY AS NEEDED FOR COUGH    . furosemide (LASIX) 40 MG tablet Take 1 tablet (40 mg total) by mouth daily. 30 tablet 1  . nitroGLYCERIN (NITROSTAT) 0.4 MG SL tablet Place 0.4 mg under the tongue every 5 (five) minutes x 3 doses as needed for chest pain.      No current facility-administered medications for this visit.     PHYSICAL EXAMINATION: ECOG PERFORMANCE STATUS: 1 - Symptomatic but completely ambulatory Vitals:   03/08/19 0957  BP: 140/74  Pulse: 82  Resp: 18  Temp: (!) 97.1 F (36.2 C)   Filed Weights   03/08/19 0957  Weight: 162 lb 12.8 oz (73.8 kg)    Physical Exam Constitutional:      General: She is not in acute distress. HENT:     Head: Normocephalic and atraumatic.  Eyes:     General: No scleral icterus.    Pupils: Pupils are equal, round, and reactive to light.  Neck:     Comments: No discrete palpable lymph node.   Cardiovascular:     Rate and Rhythm: Normal rate and regular rhythm.     Heart sounds: Normal heart sounds.  Pulmonary:     Effort: Pulmonary effort is normal. No respiratory distress.     Breath sounds: No wheezing.  Abdominal:     General: Bowel sounds are normal. There is no distension.     Palpations: Abdomen is soft. There is no mass.     Tenderness: There is no abdominal tenderness.  Musculoskeletal:        General: No deformity. Normal range of motion.     Cervical back: Normal range of motion and neck supple.  Skin:    General: Skin is warm and dry.     Findings: No erythema or rash.  Neurological:     Mental Status: She is alert and oriented to person, place, and time.     Cranial Nerves: No cranial nerve deficit.     Coordination: Coordination normal.  Psychiatric:  Behavior: Behavior normal.        Thought Content: Thought content normal.      LABORATORY DATA:  I have reviewed the data as listed Lab Results  Component Value Date    WBC 6.4 03/08/2019   HGB 12.5 03/08/2019   HCT 39.4 03/08/2019   MCV 95.6 03/08/2019   PLT 227 03/08/2019   Recent Labs    08/18/18 1317 03/01/19 1130 03/08/19 0928  NA 141 137 136  K 4.5 3.5 4.3  CL 105 100 101  CO2 28 27 27   GLUCOSE 102* 121* 109*  BUN 25* 21 22  CREATININE 1.16* 1.20* 1.10*  CALCIUM 9.0 8.9 9.0  GFRNONAA 44* 41* 46*  GFRAA 50* 48* 53*  PROT 7.0  --  7.2  ALBUMIN 4.0  --  3.9  AST 20  --  23  ALT 16  --  19  ALKPHOS 79  --  80  BILITOT 0.7  --  0.6   RADIOGRAPHIC STUDIES: I have personally reviewed the radiological images as listed and agreed with the findings in the report. 12/08/2017 US thyroid Left mid thyroid nodule (labeled 1) meets criteria for biopsy, as designated by the newly established ACR TI-RADS criteria, and referral for biopsy is recommended.   ASSESSMENT & PLAN:  1. History of lymphoma   2. Chest congestion   3. Thyroid nodule    History of lymphoma.  Labs are reviewed and discussed with patient. Normal LDH normal CBC and CMP. She has no constitutional signs. Recommend follow-up in 6 months.  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  Seen by Dr. Gabriel Carina on 06/12/2018 Chest congestion, Reviewed patient's history, she has history of Was previously seen by Dr. Raul Del for similar problems.  06/02/2018 high resolution CT scan was done in the past which showed moderate patchy air trapping in both lungs, indicated for for small airway disease.  No convincing findings of interstitial lung disease.  Small lung nodule Differential diagnosis was questionable ILD/fibrosis versus reactive airway disease patient was recommended to use albuterol and singulair and Flovent.  I recommend patient to follow-up with Dr. Raul Del for reevaluation and management.  Continue her inhalers.  Continue Singulair.    Orders Placed This Encounter  Procedures  . CBC with Differential    Standing Status:   Future    Standing  Expiration Date:   03/07/2020  . Comprehensive metabolic panel    Standing Status:   Future    Standing Expiration Date:   03/07/2020  . Lactate dehydrogenase    Standing Status:   Future    Standing Expiration Date:   03/07/2020    We spent sufficient time to discuss many aspect of care, questions were answered to patient's satisfaction. Earlie Server, MD, PhD Hematology Oncology [facility] Pager- [phone] 03/08/2019

## 2019-03-08 NOTE — Progress Notes (Signed)
Pt here for follow up. No concerns voiced.  

## 2019-03-09 ENCOUNTER — Other Ambulatory Visit (INDEPENDENT_AMBULATORY_CARE_PROVIDER_SITE_OTHER): Payer: Self-pay | Admitting: Nurse Practitioner

## 2019-03-09 DIAGNOSIS — I83812 Varicose veins of left lower extremities with pain: Secondary | ICD-10-CM

## 2019-03-09 DIAGNOSIS — M7989 Other specified soft tissue disorders: Secondary | ICD-10-CM

## 2019-03-09 DIAGNOSIS — M79605 Pain in left leg: Secondary | ICD-10-CM

## 2019-03-10 ENCOUNTER — Ambulatory Visit (INDEPENDENT_AMBULATORY_CARE_PROVIDER_SITE_OTHER): Payer: Medicare Other

## 2019-03-10 ENCOUNTER — Other Ambulatory Visit: Payer: Self-pay

## 2019-03-10 ENCOUNTER — Encounter (INDEPENDENT_AMBULATORY_CARE_PROVIDER_SITE_OTHER): Payer: Self-pay | Admitting: Nurse Practitioner

## 2019-03-10 ENCOUNTER — Ambulatory Visit (INDEPENDENT_AMBULATORY_CARE_PROVIDER_SITE_OTHER): Payer: Medicare Other | Admitting: Nurse Practitioner

## 2019-03-10 VITALS — BP 130/71 | HR 79 | Resp 14 | Ht 61.0 in | Wt 163.0 lb

## 2019-03-10 DIAGNOSIS — M7989 Other specified soft tissue disorders: Secondary | ICD-10-CM | POA: Diagnosis not present

## 2019-03-10 DIAGNOSIS — I83812 Varicose veins of left lower extremities with pain: Secondary | ICD-10-CM | POA: Diagnosis not present

## 2019-03-10 DIAGNOSIS — R600 Localized edema: Secondary | ICD-10-CM | POA: Diagnosis not present

## 2019-03-10 DIAGNOSIS — M79604 Pain in right leg: Secondary | ICD-10-CM | POA: Diagnosis not present

## 2019-03-10 DIAGNOSIS — M79605 Pain in left leg: Secondary | ICD-10-CM

## 2019-03-10 DIAGNOSIS — M79606 Pain in leg, unspecified: Secondary | ICD-10-CM | POA: Insufficient documentation

## 2019-03-10 NOTE — Progress Notes (Signed)
SUBJECTIVE:  Patient ID: [name], female    DOB: [birth year], 86 y.o.   MRN: [id number] Chief Complaint  Patient presents with  . Follow-up    U/S Follow up    HPI  [name] is a 86 y.o. female presents today for pain in her bilateral lower extremities.  She does endorse the pain in her left lower extremity is worse in addition to the edema.  She endorses the pain being more of a soreness at the anterior surface.  She also endorses having to wake up in the night and get up and walk it feels as if something is crawling on her legs.  She states that this even happens sometimes when she is sitting during the day she has has the sensation to get up and move.  The patient is currently being treated for restless leg syndrome.  The patient has been doing conservative therapy since at least May 2020, this was until recently when the pain became more intense.  She does state that the pain is much worse today than the day after she has been standing for some time.  The pain is now getting to the point where it is beginning to affect her everyday activities such as shopping, cleaning and other associated activities.  She denies any fever, chills, nausea, vomiting or diarrhea.  Patient does have a previous history of sclerotherapy on the left lower extremity however she denies any endovenous procedures.  The patient does endorse having a history of family with varicose veins.  She denies any fever, chills, nausea, vomiting or diarrhea.  Today the patient underwent bilateral ABIs which reveals an ABI 1.25 bilaterally.  The right TBI 0.84 and left was 0.88.  The patient has triphasic waveforms in the bilateral tibial arteries with strong toe waveforms bilaterally.  The patient underwent a lower extremity venous reflux of her left lower extremity.  There is evidence of reflux within the deep venous system as well as reflux beginning at the saphenofemoral junction of the great saphenous vein extending  to the level of the knee at the great saphenous vein.  The patient's vein diameter is range from 0.40 to 0.85 cm.  Past Medical History:  Diagnosis Date  . Afib ([place])   . Anxiety   . Anxiety   . Bronchitis    recent  . Cancer ([place])    Lymphoma   . Chronic kidney disease    ckd stage 3  . Congestive heart failure (CHF) ([place])   . Coronary artery disease    Status post stent placement in LAD  . Dysrhythmia   . GERD (gastroesophageal reflux disease)   . Hyperlipemia   . Hypertension   . Hypothyroidism   . Thyroid disease     Past Surgical History:  Procedure Laterality Date  . ABDOMINAL HYSTERECTOMY     complete  . CORONARY ANGIOPLASTY     stent  . CORONARY STENT PLACEMENT    . EXCISION OF ABDOMINAL WALL TUMOR Right 05/08/2018   Procedure: EXCISION OF ABDOMINAL WALL-RIGHT;  Surgeon: Herbert Pun, MD;  Location: [facility] ORS;  Service: General;  Laterality: Right;  . INCISIONAL HERNIA REPAIR N/A 05/08/2018   Procedure: LAPAROSCOPIC VS. OPEN INCISIONAL HERNIA REPAIR WITH MESH;  Surgeon: Herbert Pun, MD;  Location: [facility] ORS;  Service: General;  Laterality: N/A;  . VEIN SURGERY     Endovenous ablation of saphenous vein    Social History   Socioeconomic History  . Marital status: Single  Spouse name: Not on file  . Number of children: Not on file  . Years of education: Not on file  . Highest education level: Not on file  Occupational History  . Occupation: retired  Tobacco Use  . Smoking status: Never Smoker  . Smokeless tobacco: Never Used  Substance and Sexual Activity  . Alcohol use: No  . Drug use: No  . Sexual activity: Not on file  Other Topics Concern  . Not on file  Social History Narrative   Lives at home independently. Has a cane to ambulate occasionally. Continues to drive.   Social Determinants of Health   Financial Resource Strain:   . Difficulty of Paying Living Expenses: Not on file  Food Insecurity:   . Worried About Ship broker in the Last Year: Not on file  . Ran Out of Food in the Last Year: Not on file  Transportation Needs:   . Lack of Transportation (Medical): Not on file  . Lack of Transportation (Non-Medical): Not on file  Physical Activity:   . Days of Exercise per Week: Not on file  . Minutes of Exercise per Session: Not on file  Stress:   . Feeling of Stress : Not on file  Social Connections:   . Frequency of Communication with Friends and Family: Not on file  . Frequency of Social Gatherings with Friends and Family: Not on file  . Attends Religious Services: Not on file  . Active Member of Clubs or Organizations: Not on file  . Attends Archivist Meetings: Not on file  . Marital Status: Not on file  Intimate Partner Violence:   . Fear of Current or Ex-Partner: Not on file  . Emotionally Abused: Not on file  . Physically Abused: Not on file  . Sexually Abused: Not on file    Family History  Problem Relation Age of Onset  . Heart Problems Mother   . Heart Problems Father   . Heart attack Father   . Diabetes Brother   . Diabetes Brother     Allergies  Allergen Reactions  . Sulfa Antibiotics Hives and Rash     Review of Systems   Review of Systems: Negative Unless Checked Constitutional: [] Weight loss  [] Fever  [] Chills Cardiac: [] Chest pain   []  Atrial Fibrillation  [] Palpitations   [] Shortness of breath when laying flat   [] Shortness of breath with exertion. [] Shortness of breath at rest Vascular:  [x] Pain in legs with walking   [] Pain in legs with standing [] Pain in legs when laying flat   [x] Claudication    [] Pain in feet when laying flat    [] History of DVT   [] Phlebitis   [x] Swelling in legs   [x] Varicose veins   [] Non-healing ulcers Pulmonary:   [] Uses home oxygen   [] Productive cough   [] Hemoptysis   [] Wheeze  [] COPD   [] Asthma Neurologic:  [] Dizziness   [] Seizures  [] Blackouts [] History of stroke   [] History of TIA  [] Aphasia   [] Temporary Blindness    [] Weakness or numbness in arm   [] Weakness or numbness in leg Musculoskeletal:   [] Joint swelling   [] Joint pain   [] Low back pain  []  History of Knee Replacement [] Arthritis [] back Surgeries  []  Spinal Stenosis    Hematologic:  [] Easy bruising  [] Easy bleeding   [] Hypercoagulable state   [x] Anemic Gastrointestinal:  [] Diarrhea   [] Vomiting  [] Gastroesophageal reflux/heartburn   [] Difficulty swallowing. [] Abdominal pain Genitourinary:  [x] Chronic kidney disease   [] Difficult urination  []   Anuric   [] Blood in urine [] Frequent urination  [] Burning with urination   [] Hematuria Skin:  [] Rashes   [] Ulcers [] Wounds Psychological:  [] History of anxiety   [x]  History of major depression  []  Memory Difficulties      OBJECTIVE:   Physical Exam  BP 130/71 (BP Location: Right Arm)   Pulse 79   Resp 14   Ht 5\' 1"  (1.549 m)   Wt 163 lb (73.9 kg)   BMI 30.80 kg/m   Gen: WD/WN, NAD Head: [place]/AT, No temporalis wasting.  Ear/Nose/Throat: Hearing grossly intact, nares w/o erythema or drainage Eyes: PER, EOMI, sclera nonicteric.  Neck: Supple, no masses.  No JVD.  Pulmonary:  Good air movement, no use of accessory muscles.  Cardiac: RRR Vascular: Large varicosities on the left medial surface, extending from her mid calf to her ankle.  Scattered smaller varicosities bilaterally.  Large varicosity approximately 4 to 5 mm. Vessel Right Left  Radial Palpable Palpable  Dorsalis Pedis Not Palpable Not Palpable  Posterior Tibial Palpable Palpable   Gastrointestinal: soft, non-distended. No guarding/no peritoneal signs.  Musculoskeletal: M/S 5/5 throughout.  No deformity or atrophy.  Neurologic: Pain and light touch intact in extremities.  Symmetrical.  Speech is fluent. Motor exam as listed above. Psychiatric: Judgment intact, Mood & affect appropriate for pt's clinical situation. Dermatologic: No Venous rashes. No Ulcers Noted.  No changes consistent with cellulitis. Lymph : No Cervical lymphadenopathy,  no lichenification or skin changes of chronic lymphedema.       ASSESSMENT AND PLAN:  1. Varicose veins of leg with pain, left Recommend  I have reviewed my previous  discussion with the patient regarding  varicose veins and why they cause symptoms. Patient will continue  wearing graduated compression stockings class 1 on a daily basis, beginning first thing in the morning and removing them in the evening.    In addition, behavioral modification including elevation during the day was again discussed and this will continue.  The patient has utilized over the counter pain medications and has been exercising.  However, at this time conservative therapy has not alleviated the patient's symptoms of leg pain and swelling  Recommend: laser ablation of the left great saphenous veins to eliminate the symptoms of pain and swelling of the lower extremities caused by the severe superficial venous reflux disease.   2. Edema leg The patient primarily had edema in her left lower extremity although she does endorse having some of the right.  They will lower extremity lesion should help some with edema however the patient should also continue to wear her medical grade 1 compression stockings and elevate her lower extremities when possible.  She should also continue to exercise daily to help with the edema.  3. Pain in both lower extremities Based upon the patient's current description of pain it sounds like a good portion of it may be related to her varicose veins.  The patient also has a history of restless leg syndrome which will also cause her some discomfort.  However we have ruled out any arterial causes of the patient's pain as the patient does have strong blood flow in her bilateral lower extremities.  Any further pain should be worked up by the primary care physician.   Current Outpatient Medications on File Prior to Visit  Medication Sig Dispense Refill  . albuterol (VENTOLIN HFA) 108 (90 Base)  MCG/ACT inhaler Inhale 2 puffs into the lungs every 6 (six) hours as needed for wheezing or shortness of breath.  8 g 0  . allopurinol (ZYLOPRIM) 100 MG tablet Take 100 mg by mouth 2 (two) times daily.   1  . amoxicillin (AMOXIL) 250 MG capsule Take by mouth.    . cyclobenzaprine (FLEXERIL) 5 MG tablet Take 5 mg by mouth 3 (three) times daily as needed for muscle spasms.     . fluticasone (FLONASE) 50 MCG/ACT nasal spray Place 1 spray into the nose daily as needed for allergies.     . furosemide (LASIX) 40 MG tablet     . gabapentin (NEURONTIN) 100 MG capsule Take by mouth.    . levothyroxine (SYNTHROID, LEVOTHROID) 50 MCG tablet Take 50 mcg by mouth daily before breakfast.    . lovastatin (MEVACOR) 40 MG tablet Take 40 mg by mouth at bedtime.     . methylPREDNISolone (MEDROL DOSEPAK) 4 MG TBPK tablet Follow package directions.    . nitroGLYCERIN (NITROSTAT) 0.4 MG SL tablet Place 0.4 mg under the tongue every 5 (five) minutes x 3 doses as needed for chest pain.     Marland Kitchen ondansetron (ZOFRAN-ODT) 4 MG disintegrating tablet Take 4 mg by mouth every 8 (eight) hours as needed for nausea or vomiting.   0  . potassium chloride (K-DUR,KLOR-CON) 10 MEQ tablet Take 10 mEq by mouth daily.     . predniSONE (DELTASONE) 5 MG tablet     . rOPINIRole (REQUIP) 2 MG tablet Take 2 mg by mouth 2 (two) times daily.    Marland Kitchen tiZANidine (ZANAFLEX) 2 MG tablet TAKE 1 TABLET (2 MG TOTAL) BY MOUTH 3 (THREE) TIMES DAILY AS NEEDED    . traMADol (ULTRAM) 50 MG tablet Take 50 mg by mouth 2 (two) times daily as needed for moderate pain.   3  . aspirin EC 81 MG tablet Take 81 mg by mouth daily.    . benzonatate (TESSALON) 200 MG capsule Take 200 mg by mouth 3 (three) times daily as needed for cough.    . chlorpheniramine-HYDROcodone (TUSSIONEX) 10-8 MG/5ML SUER TAKE 5 MLS BY MOUTH NIGHTLY AS NEEDED FOR COUGH    . famotidine (PEPCID) 20 MG tablet Take 20 mg by mouth 2 (two) times daily.  2  . furosemide (LASIX) 40 MG tablet Take 1  tablet (40 mg total) by mouth daily. 30 tablet 1  . HYDROcodone-acetaminophen (NORCO/VICODIN) 5-325 MG tablet Take 1 tablet by mouth every 6 (six) hours as needed for moderate pain.    . magic mouthwash w/lidocaine SOLN Take 5 mLs by mouth 3 (three) times daily as needed for mouth pain.    . montelukast (SINGULAIR) 10 MG tablet TAKE 1 TABLET BY MOUTH EVERY DAY AT NIGHT     No current facility-administered medications on file prior to visit.    There are no Patient Instructions on file for this visit. No follow-ups on file.   Kris Hartmann, NP  This note was completed with Sales executive.  Any errors are purely unintentional.

## 2019-04-23 ENCOUNTER — Ambulatory Visit: Payer: Medicare Other | Attending: Internal Medicine

## 2019-04-23 DIAGNOSIS — G4761 Periodic limb movement disorder: Secondary | ICD-10-CM | POA: Diagnosis not present

## 2019-04-23 DIAGNOSIS — G4710 Hypersomnia, unspecified: Secondary | ICD-10-CM | POA: Diagnosis not present

## 2019-04-23 DIAGNOSIS — G4733 Obstructive sleep apnea (adult) (pediatric): Secondary | ICD-10-CM | POA: Diagnosis not present

## 2019-04-23 DIAGNOSIS — R0683 Snoring: Secondary | ICD-10-CM | POA: Diagnosis present

## 2019-04-26 ENCOUNTER — Other Ambulatory Visit: Payer: Self-pay

## 2019-04-30 ENCOUNTER — Other Ambulatory Visit: Payer: Self-pay

## 2019-04-30 ENCOUNTER — Ambulatory Visit (INDEPENDENT_AMBULATORY_CARE_PROVIDER_SITE_OTHER): Payer: Medicare Other | Admitting: Vascular Surgery

## 2019-04-30 VITALS — BP 130/71 | HR 86 | Resp 16 | Ht 61.0 in | Wt 157.0 lb

## 2019-04-30 DIAGNOSIS — I83812 Varicose veins of left lower extremities with pain: Secondary | ICD-10-CM | POA: Diagnosis not present

## 2019-04-30 NOTE — Progress Notes (Signed)
[name] is a 86 y.o. female who presents with symptomatic venous reflux  Past Medical History:  Diagnosis Date  . Afib ([place])   . Anxiety   . Anxiety   . Bronchitis    recent  . Cancer ([place])    Lymphoma   . Chronic kidney disease    ckd stage 3  . Congestive heart failure (CHF) ([place])   . Coronary artery disease    Status post stent placement in LAD  . Dysrhythmia   . GERD (gastroesophageal reflux disease)   . Hyperlipemia   . Hypertension   . Hypothyroidism   . Thyroid disease     Past Surgical History:  Procedure Laterality Date  . ABDOMINAL HYSTERECTOMY     complete  . CORONARY ANGIOPLASTY     stent  . CORONARY STENT PLACEMENT    . EXCISION OF ABDOMINAL WALL TUMOR Right 05/08/2018   Procedure: EXCISION OF ABDOMINAL WALL-RIGHT;  Surgeon: Herbert Pun, MD;  Location: [facility] ORS;  Service: General;  Laterality: Right;  . INCISIONAL HERNIA REPAIR N/A 05/08/2018   Procedure: LAPAROSCOPIC VS. OPEN INCISIONAL HERNIA REPAIR WITH MESH;  Surgeon: Herbert Pun, MD;  Location: [facility] ORS;  Service: General;  Laterality: N/A;  . VEIN SURGERY     Endovenous ablation of saphenous vein     Current Outpatient Medications:  .  albuterol (VENTOLIN HFA) 108 (90 Base) MCG/ACT inhaler, Inhale 2 puffs into the lungs every 6 (six) hours as needed for wheezing or shortness of breath., Disp: 8 g, Rfl: 0 .  allopurinol (ZYLOPRIM) 100 MG tablet, Take 100 mg by mouth 2 (two) times daily. , Disp: , Rfl: 1 .  aspirin EC 81 MG tablet, Take 81 mg by mouth daily., Disp: , Rfl:  .  benzonatate (TESSALON) 200 MG capsule, Take 200 mg by mouth 3 (three) times daily as needed for cough., Disp: , Rfl:  .  chlorpheniramine-HYDROcodone (TUSSIONEX) 10-8 MG/5ML SUER, TAKE 5 MLS BY MOUTH NIGHTLY AS NEEDED FOR COUGH, Disp: , Rfl:  .  cyclobenzaprine (FLEXERIL) 5 MG tablet, Take 5 mg by mouth 3 (three) times daily as needed for muscle spasms. , Disp: , Rfl:  .  famotidine (PEPCID) 20 MG tablet,  Take 20 mg by mouth 2 (two) times daily., Disp: , Rfl: 2 .  fluticasone (FLONASE) 50 MCG/ACT nasal spray, Place 1 spray into the nose daily as needed for allergies. , Disp: , Rfl:  .  furosemide (LASIX) 40 MG tablet, , Disp: , Rfl:  .  gabapentin (NEURONTIN) 100 MG capsule, Take by mouth., Disp: , Rfl:  .  HYDROcodone-acetaminophen (NORCO/VICODIN) 5-325 MG tablet, Take 1 tablet by mouth every 6 (six) hours as needed for moderate pain., Disp: , Rfl:  .  levothyroxine (SYNTHROID, LEVOTHROID) 50 MCG tablet, Take 50 mcg by mouth daily before breakfast., Disp: , Rfl:  .  lovastatin (MEVACOR) 40 MG tablet, Take 40 mg by mouth at bedtime. , Disp: , Rfl:  .  magic mouthwash w/lidocaine SOLN, Take 5 mLs by mouth 3 (three) times daily as needed for mouth pain., Disp: , Rfl:  .  montelukast (SINGULAIR) 10 MG tablet, TAKE 1 TABLET BY MOUTH EVERY DAY AT NIGHT, Disp: , Rfl:  .  nitroGLYCERIN (NITROSTAT) 0.4 MG SL tablet, Place 0.4 mg under the tongue every 5 (five) minutes x 3 doses as needed for chest pain. , Disp: , Rfl:  .  ondansetron (ZOFRAN-ODT) 4 MG disintegrating tablet, Take 4 mg by mouth every 8 (eight) hours  as needed for nausea or vomiting. , Disp: , Rfl: 0 .  potassium chloride (K-DUR,KLOR-CON) 10 MEQ tablet, Take 10 mEq by mouth daily. , Disp: , Rfl:  .  predniSONE (DELTASONE) 5 MG tablet, , Disp: , Rfl:  .  rOPINIRole (REQUIP) 2 MG tablet, Take 2 mg by mouth 2 (two) times daily., Disp: , Rfl:  .  tiZANidine (ZANAFLEX) 2 MG tablet, TAKE 1 TABLET (2 MG TOTAL) BY MOUTH 3 (THREE) TIMES DAILY AS NEEDED, Disp: , Rfl:  .  traMADol (ULTRAM) 50 MG tablet, Take 50 mg by mouth 2 (two) times daily as needed for moderate pain. , Disp: , Rfl: 3 .  ALPRAZolam (XANAX) 0.5 MG tablet, TAKE 1ST TABLET 1 HOUR PRIOR TO PROCEDURE. TAKE 2ND TABLET UPON ARRIVAL, Disp: , Rfl:  .  DULoxetine (CYMBALTA) 20 MG capsule, Take 20 mg by mouth daily., Disp: , Rfl:  .  furosemide (LASIX) 40 MG tablet, Take 1 tablet (40 mg total)  by mouth daily., Disp: 30 tablet, Rfl: 1 .  pantoprazole (PROTONIX) 40 MG tablet, Take 40 mg by mouth daily., Disp: , Rfl:   Allergies  Allergen Reactions  . Sulfa Antibiotics Hives and Rash     Varicose veins of leg with pain, left     PLAN: The patient's left lower extremity was sterilely prepped and draped. The ultrasound machine was used to visualize the saphenous vein throughout its course. A segment in the mid to upper calf was selected for access. The saphenous vein was accessed without difficulty using ultrasound guidance with a micropuncture needle. A 0.018 wire was then placed beyond the saphenofemoral junction and the needle was removed. The 65 cm sheath was then placed over the wire and the wire and dilator were removed. The laser fiber was then placed through the sheath and its tip was placed approximately 4-5 centimeters below the saphenofemoral junction. Tumescent anesthesia was then created with a dilute lidocaine solution. Laser energy was then delivered with constant withdrawal of the sheath and laser fiber. Approximately 1427 joules of energy were delivered over a length of 35 centimeters using a 1470 Hz VenaCure machine at 7 W. Sterile dressings were placed. The patient tolerated the procedure well without obvious complications.   Follow-up in 1 week with post-laser duplex.

## 2019-05-03 ENCOUNTER — Ambulatory Visit (INDEPENDENT_AMBULATORY_CARE_PROVIDER_SITE_OTHER): Payer: Medicare Other

## 2019-05-03 ENCOUNTER — Other Ambulatory Visit: Payer: Self-pay

## 2019-05-03 DIAGNOSIS — I83812 Varicose veins of left lower extremities with pain: Secondary | ICD-10-CM

## 2019-05-10 ENCOUNTER — Telehealth (INDEPENDENT_AMBULATORY_CARE_PROVIDER_SITE_OTHER): Payer: Self-pay

## 2019-05-10 NOTE — Telephone Encounter (Signed)
I Called pt and made her aware  of the message above.

## 2019-05-10 NOTE — Telephone Encounter (Signed)
Pt called saying she had a procedure done in office on the eighth and is having pain in her left leg and a knot has appeared.Th pt would like to know can she come in to be seen. Please advise.

## 2019-05-10 NOTE — Telephone Encounter (Signed)
The knot that she feels is actually normal after the procedure.  This is because the vein that Dr. Lucky Cowboy lasered is inflamed and is in the process of healing.  It is also normal to have some discomfort.  The ultrasound that she had on the 8th showed that the procedure was successful and there were no blood clots.  She should take ibuprofen for pain as instructed on her discharge sheet.  If it continues to hurt in a week we can bring her in to look at it. She should call us back to let us know

## 2019-05-27 ENCOUNTER — Other Ambulatory Visit: Payer: Self-pay | Admitting: Physician Assistant

## 2019-05-27 DIAGNOSIS — R911 Solitary pulmonary nodule: Secondary | ICD-10-CM

## 2019-05-27 DIAGNOSIS — R490 Dysphonia: Secondary | ICD-10-CM

## 2019-06-01 ENCOUNTER — Encounter (INDEPENDENT_AMBULATORY_CARE_PROVIDER_SITE_OTHER): Payer: Self-pay | Admitting: Vascular Surgery

## 2019-06-01 ENCOUNTER — Other Ambulatory Visit: Payer: Self-pay

## 2019-06-01 ENCOUNTER — Ambulatory Visit (INDEPENDENT_AMBULATORY_CARE_PROVIDER_SITE_OTHER): Payer: Medicare Other | Admitting: Vascular Surgery

## 2019-06-01 VITALS — BP 137/76 | HR 82 | Ht 61.0 in | Wt 157.0 lb

## 2019-06-01 DIAGNOSIS — I83812 Varicose veins of left lower extremities with pain: Secondary | ICD-10-CM

## 2019-06-01 DIAGNOSIS — Z8579 Personal history of other malignant neoplasms of lymphoid, hematopoietic and related tissues: Secondary | ICD-10-CM | POA: Diagnosis not present

## 2019-06-01 DIAGNOSIS — N183 Chronic kidney disease, stage 3 unspecified: Secondary | ICD-10-CM | POA: Diagnosis not present

## 2019-06-01 DIAGNOSIS — E785 Hyperlipidemia, unspecified: Secondary | ICD-10-CM

## 2019-06-01 DIAGNOSIS — M7989 Other specified soft tissue disorders: Secondary | ICD-10-CM

## 2019-06-01 NOTE — Assessment & Plan Note (Signed)
Can certainly exacerbate lower extremity swelling.

## 2019-06-01 NOTE — Assessment & Plan Note (Signed)
Left leg is doing better after laser ablation.  Continue compression and elevation as well as increasing activity.

## 2019-06-01 NOTE — Assessment & Plan Note (Signed)
Particular with her history of lymphoma, a component of lymphedema is likely present as well in the lower extremities.  Pending the results of her right leg reflux study, we may have to consider getting her a lymphedema pump.

## 2019-06-01 NOTE — Patient Instructions (Signed)

## 2019-06-01 NOTE — Progress Notes (Signed)
MRN : [id number]  [name] is a 86 y.o. (11-05-1932) female who presents with chief complaint of  Chief Complaint  Patient presents with  . Follow-up    4 wks. post laser   .  History of Present Illness: Patient returns today in follow up of venous insufficiency and leg swelling.  She underwent left great saphenous vein laser ablation about a month ago with significant improvement in her left leg pain and swelling.  Her postprocedural duplex showed a successful ablation without DVT.  However, since that time, she has noticed more pain and swelling in her right leg.  This may be that her left leg is not bothering her as much, but her right leg is a little more swollen today.  No fevers or chills.  No chest pain or shortness of breath.  Current Outpatient Medications  Medication Sig Dispense Refill  . albuterol (VENTOLIN HFA) 108 (90 Base) MCG/ACT inhaler Inhale 2 puffs into the lungs every 6 (six) hours as needed for wheezing or shortness of breath. 8 g 0  . allopurinol (ZYLOPRIM) 100 MG tablet Take 100 mg by mouth 2 (two) times daily.   1  . ALPRAZolam (XANAX) 0.5 MG tablet TAKE 1ST TABLET 1 HOUR PRIOR TO PROCEDURE. TAKE 2ND TABLET UPON ARRIVAL    . aspirin EC 81 MG tablet Take 81 mg by mouth daily.    . benzonatate (TESSALON) 200 MG capsule Take 200 mg by mouth 3 (three) times daily as needed for cough.    . chlorpheniramine-HYDROcodone (TUSSIONEX) 10-8 MG/5ML SUER TAKE 5 MLS BY MOUTH NIGHTLY AS NEEDED FOR COUGH    . cyclobenzaprine (FLEXERIL) 5 MG tablet Take 5 mg by mouth 3 (three) times daily as needed for muscle spasms.     . DULoxetine (CYMBALTA) 20 MG capsule Take 20 mg by mouth daily.    . famotidine (PEPCID) 20 MG tablet Take 20 mg by mouth 2 (two) times daily.  2  . fluticasone (FLONASE) 50 MCG/ACT nasal spray Place 1 spray into the nose daily as needed for allergies.     . furosemide (LASIX) 40 MG tablet     . gabapentin (NEURONTIN) 100 MG capsule Take by mouth.    Marland Kitchen  HYDROcodone-acetaminophen (NORCO/VICODIN) 5-325 MG tablet Take 1 tablet by mouth every 6 (six) hours as needed for moderate pain.    Marland Kitchen levothyroxine (SYNTHROID, LEVOTHROID) 50 MCG tablet Take 50 mcg by mouth daily before breakfast.    . lovastatin (MEVACOR) 40 MG tablet Take 40 mg by mouth at bedtime.     . magic mouthwash w/lidocaine SOLN Take 5 mLs by mouth 3 (three) times daily as needed for mouth pain.    . montelukast (SINGULAIR) 10 MG tablet TAKE 1 TABLET BY MOUTH EVERY DAY AT NIGHT    . nitroGLYCERIN (NITROSTAT) 0.4 MG SL tablet Place 0.4 mg under the tongue every 5 (five) minutes x 3 doses as needed for chest pain.     Marland Kitchen ondansetron (ZOFRAN-ODT) 4 MG disintegrating tablet Take 4 mg by mouth every 8 (eight) hours as needed for nausea or vomiting.   0  . pantoprazole (PROTONIX) 40 MG tablet Take 40 mg by mouth daily.    . potassium chloride (K-DUR,KLOR-CON) 10 MEQ tablet Take 10 mEq by mouth daily.     . predniSONE (DELTASONE) 5 MG tablet     . rOPINIRole (REQUIP) 2 MG tablet Take 2 mg by mouth 2 (two) times daily.    Marland Kitchen tiZANidine (ZANAFLEX)  2 MG tablet TAKE 1 TABLET (2 MG TOTAL) BY MOUTH 3 (THREE) TIMES DAILY AS NEEDED    . traMADol (ULTRAM) 50 MG tablet Take 50 mg by mouth 2 (two) times daily as needed for moderate pain.   3  . furosemide (LASIX) 40 MG tablet Take 1 tablet (40 mg total) by mouth daily. 30 tablet 1   No current facility-administered medications for this visit.    Past Medical History:  Diagnosis Date  . Afib ([place])   . Anxiety   . Anxiety   . Bronchitis    recent  . Cancer ([place])    Lymphoma   . Chronic kidney disease    ckd stage 3  . Congestive heart failure (CHF) ([place])   . Coronary artery disease    Status post stent placement in LAD  . Dysrhythmia   . GERD (gastroesophageal reflux disease)   . Hyperlipemia   . Hypertension   . Hypothyroidism   . Thyroid disease     Past Surgical History:  Procedure Laterality Date  . ABDOMINAL HYSTERECTOMY      complete  . CORONARY ANGIOPLASTY     stent  . CORONARY STENT PLACEMENT    . EXCISION OF ABDOMINAL WALL TUMOR Right 05/08/2018   Procedure: EXCISION OF ABDOMINAL WALL-RIGHT;  Surgeon: Herbert Pun, MD;  Location: [facility] ORS;  Service: General;  Laterality: Right;  . INCISIONAL HERNIA REPAIR N/A 05/08/2018   Procedure: LAPAROSCOPIC VS. OPEN INCISIONAL HERNIA REPAIR WITH MESH;  Surgeon: Herbert Pun, MD;  Location: [facility] ORS;  Service: General;  Laterality: N/A;  . VEIN SURGERY     Endovenous ablation of saphenous vein     Social History   Tobacco Use  . Smoking status: Never Smoker  . Smokeless tobacco: Never Used  Substance Use Topics  . Alcohol use: No  . Drug use: No    Family History  Problem Relation Age of Onset  . Heart Problems Mother   . Heart Problems Father   . Heart attack Father   . Diabetes Brother   . Diabetes Brother      Allergies  Allergen Reactions  . Sulfa Antibiotics Hives and Rash     REVIEW OF SYSTEMS (Negative unless checked)  Constitutional: [] Weight loss  [] Fever  [] Chills Cardiac: [] Chest pain   [] Chest pressure   [x] Palpitations   [] Shortness of breath when laying flat   [] Shortness of breath at rest   [] Shortness of breath with exertion. Vascular:  [x] Pain in legs with walking   [x] Pain in legs at rest   [] Pain in legs when laying flat   [] Claudication   [] Pain in feet when walking  [] Pain in feet at rest  [] Pain in feet when laying flat   [] History of DVT   [] Phlebitis   [x] Swelling in legs   [x] Varicose veins   [] Non-healing ulcers Pulmonary:   [] Uses home oxygen   [] Productive cough   [] Hemoptysis   [] Wheeze  [x] COPD   [] Asthma Neurologic:  [] Dizziness  [] Blackouts   [] Seizures   [] History of stroke   [] History of TIA  [] Aphasia   [] Temporary blindness   [] Dysphagia   [] Weakness or numbness in arms   [] Weakness or numbness in legs Musculoskeletal:  [x] Arthritis   [] Joint swelling   [] Joint pain   [] Low back pain Hematologic:   [] Easy bruising  [] Easy bleeding   [] Hypercoagulable state   [] Anemic   Gastrointestinal:  [] Blood in stool   [] Vomiting blood  [x] Gastroesophageal reflux/heartburn   [] Abdominal pain  Genitourinary:  [x] Chronic kidney disease   [] Difficult urination  [] Frequent urination  [] Burning with urination   [] Hematuria Skin:  [] Rashes   [] Ulcers   [] Wounds Psychological:  [] History of anxiety   []  History of major depression.  Physical Examination  BP 137/76 (BP Location: Right Arm)   Pulse 82   Ht 5\' 1"  (1.549 m)   Wt 157 lb (71.2 kg)   BMI 29.66 kg/m  Gen:  WD/WN, NAD.  Appears younger than stated age Head: [place]/AT, No temporalis wasting. Ear/Nose/Throat: Hearing grossly intact, nares w/o erythema or drainage Eyes: Conjunctiva clear. Sclera non-icteric Neck: Supple.  Trachea midline Pulmonary:  Good air movement, no use of accessory muscles.  Cardiac: irregular Vascular:  Vessel Right Left  Radial Palpable Palpable                          PT Palpable Palpable  DP Palpable Palpable   Gastrointestinal: soft, non-tender/non-distended. No guarding/reflex.  Musculoskeletal: M/S 5/5 throughout.  No deformity or atrophy.  1-2+ right lower extremity edema, trace left lower extremity edema. Neurologic: Sensation grossly intact in extremities.  Symmetrical.  Speech is fluent.  Psychiatric: Judgment intact, Mood & affect appropriate for pt's clinical situation. Dermatologic: No rashes or ulcers noted.  No cellulitis or open wounds.       Labs Recent Results (from the past 2160 hour(s))  Lactate dehydrogenase     Status: None   Collection Time: 03/08/19  9:28 AM  Result Value Ref Range   LDH 187 98 - 192 U/L    Comment: Performed at [facility], [address] [place], [place] [address]  Comprehensive metabolic panel     Status: Abnormal   Collection Time: 03/08/19  9:28 AM  Result Value Ref Range   Sodium 136 135 - 145 mmol/L   Potassium 4.3 3.5 - 5.1 mmol/L   Chloride  101 98 - 111 mmol/L   CO2 27 22 - 32 mmol/L   Glucose, Bld 109 (H) 70 - 99 mg/dL   BUN 22 8 - 23 mg/dL   Creatinine, Ser 1.10 (H) 0.44 - 1.00 mg/dL   Calcium 9.0 8.9 - 10.3 mg/dL   Total Protein 7.2 6.5 - 8.1 g/dL   Albumin 3.9 3.5 - 5.0 g/dL   AST 23 15 - 41 U/L   ALT 19 0 - 44 U/L   Alkaline Phosphatase 80 38 - 126 U/L   Total Bilirubin 0.6 0.3 - 1.2 mg/dL   GFR calc non Af Amer 46 (L) >60 mL/min   GFR calc Af Amer 53 (L) >60 mL/min   Anion gap 8 5 - 15    Comment: Performed at [facility], [address] [place], [place] [address]  CBC with Differential/Platelet     Status: None   Collection Time: 03/08/19  9:28 AM  Result Value Ref Range   WBC 6.4 4.0 - 10.5 K/uL   RBC 4.12 3.87 - 5.11 MIL/uL   Hemoglobin 12.5 12.0 - 15.0 g/dL   HCT 39.4 36.0 - 46.0 %   MCV 95.6 80.0 - 100.0 fL   MCH 30.3 26.0 - 34.0 pg   MCHC 31.7 30.0 - 36.0 g/dL   RDW 13.9 11.5 - 15.5 %   Platelets 227 150 - 400 K/uL   nRBC 0.0 0.0 - 0.2 %   Neutrophils Relative % 50 %   Neutro Abs 3.2 1.7 - 7.7 K/uL   Lymphocytes Relative 32 %  Lymphs Abs 2.1 0.7 - 4.0 K/uL   Monocytes Relative 14 %   Monocytes Absolute 0.9 0.1 - 1.0 K/uL   Eosinophils Relative 3 %   Eosinophils Absolute 0.2 0.0 - 0.5 K/uL   Basophils Relative 1 %   Basophils Absolute 0.0 0.0 - 0.1 K/uL   Immature Granulocytes 0 %   Abs Immature Granulocytes 0.02 0.00 - 0.07 K/uL    Comment: Performed at [facility], [address] [place], [place] [address]    Radiology VAS [place] LASER ABLATION OF SUPERFICIAL VEI  Result Date: 05/04/2019  Lower Venous Reflux Study Indications: Post Ablation Lt GSV.  Performing Technologist: Almira Coaster RVS  Examination Guidelines: A complete evaluation includes B-mode imaging, spectral Doppler, color Doppler, and power Doppler as needed of all accessible portions of each vessel. Bilateral testing is considered an integral part of a complete examination. Limited examinations for reoccurring  indications may be performed as noted. The reflux portion of the exam is performed with the patient in reverse Trendelenburg. Significant venous reflux is defined as ?500 ms in the superficial venous system, and >1 second in the deep venous system.  +---------+---------------+---------+-----------+----------+--------------+ LEFT     CompressibilityPhasicitySpontaneityPropertiesThrombus Aging +---------+---------------+---------+-----------+----------+--------------+ CFV      Full           Yes      Yes                                 +---------+---------------+---------+-----------+----------+--------------+ SFJ      Full           Yes      Yes                                 +---------+---------------+---------+-----------+----------+--------------+ FV Prox  Full           Yes      Yes                                 +---------+---------------+---------+-----------+----------+--------------+ FV Mid   Full           Yes      Yes                                 +---------+---------------+---------+-----------+----------+--------------+ FV DistalFull           Yes      Yes                                 +---------+---------------+---------+-----------+----------+--------------+ PFV      Full           Yes      Yes                                 +---------+---------------+---------+-----------+----------+--------------+ POP      Full           Yes      Yes                                 +---------+---------------+---------+-----------+----------+--------------+ GSV  None           No       No                                  +---------+---------------+---------+-----------+----------+--------------+     Summary: Left: No flow seen in the Left GSV from Knee level to approximately 1.50cms from the SFJ.  *See table(s) above for measurements and observations. Electronically signed by Leotis Pain MD on 05/04/2019 at 45:36:36 PM.    Final      Assessment/Plan  HLD (hyperlipidemia) lipid control important in reducing the progression of atherosclerotic disease. Continue statin therapy   Chronic kidney disease (CKD), stage III (moderate) Can certainly exacerbate lower extremity swelling.  History of lymphoma Particular with her history of lymphoma, a component of lymphedema is likely present as well in the lower extremities.  Pending the results of her right leg reflux study, we may have to consider getting her a lymphedema pump.  Varicose veins of leg with pain, left Left leg is doing better after laser ablation.  Continue compression and elevation as well as increasing activity.  Swelling of limb Worsening of right leg pain and swelling that is more noticeable after treatment on her left leg.  Venous reflux study will be done in the near future at her convenience.  Discussed the pathophysiology of both venous disease and lymphedema with her today.  See her back following the study to discuss further treatment options.    Leotis Pain, MD  06/01/2019 11:19 AM    This note was created with Dragon medical transcription system.  Any errors from dictation are purely unintentional

## 2019-06-01 NOTE — Assessment & Plan Note (Signed)
lipid control important in reducing the progression of atherosclerotic disease. Continue statin therapy  

## 2019-06-01 NOTE — Assessment & Plan Note (Signed)
Worsening of right leg pain and swelling that is more noticeable after treatment on her left leg.  Venous reflux study will be done in the near future at her convenience.  Discussed the pathophysiology of both venous disease and lymphedema with her today.  See her back following the study to discuss further treatment options.

## 2019-06-09 ENCOUNTER — Other Ambulatory Visit (INDEPENDENT_AMBULATORY_CARE_PROVIDER_SITE_OTHER): Payer: Self-pay | Admitting: Vascular Surgery

## 2019-06-09 DIAGNOSIS — M7989 Other specified soft tissue disorders: Secondary | ICD-10-CM

## 2019-06-09 DIAGNOSIS — M79661 Pain in right lower leg: Secondary | ICD-10-CM

## 2019-06-11 ENCOUNTER — Ambulatory Visit (INDEPENDENT_AMBULATORY_CARE_PROVIDER_SITE_OTHER): Payer: Medicare Other | Admitting: Nurse Practitioner

## 2019-06-11 ENCOUNTER — Other Ambulatory Visit: Payer: Self-pay

## 2019-06-11 ENCOUNTER — Encounter (INDEPENDENT_AMBULATORY_CARE_PROVIDER_SITE_OTHER): Payer: Self-pay | Admitting: Nurse Practitioner

## 2019-06-11 ENCOUNTER — Ambulatory Visit (INDEPENDENT_AMBULATORY_CARE_PROVIDER_SITE_OTHER): Payer: Medicare Other

## 2019-06-11 VITALS — BP 119/66 | HR 92 | Resp 18 | Ht 61.0 in | Wt 150.0 lb

## 2019-06-11 DIAGNOSIS — M7989 Other specified soft tissue disorders: Secondary | ICD-10-CM

## 2019-06-11 DIAGNOSIS — I83812 Varicose veins of left lower extremities with pain: Secondary | ICD-10-CM

## 2019-06-11 DIAGNOSIS — M79661 Pain in right lower leg: Secondary | ICD-10-CM

## 2019-06-14 ENCOUNTER — Encounter (INDEPENDENT_AMBULATORY_CARE_PROVIDER_SITE_OTHER): Payer: Self-pay | Admitting: Nurse Practitioner

## 2019-06-14 NOTE — Progress Notes (Signed)
Subjective:    Patient ID: [name], female    DOB: 29-Aug-1932, 86 y.o.   MRN: [id number] Chief Complaint  Patient presents with  . Follow-up    ultrasound    The patient returns to the office for followup status post laser ablation of the left saphenous vein on 05/03/2019.  The patient note significant improvement in the lower extremity pain but not resolution of the symptoms.  The patient still has a prominent varicosity of the left lower extremity that is contributing to pain when she is in positions.  The patient also notes more pain and swelling in her right lower extremity.  She also notes that her right lower extremity has been swelling a bit more.  She also notes that the swelling and pain that she has had in her left lower extremity is reduced since her endovenous laser ablation however not completely gone.   The patient continues to wear graduated compression stockings on a daily basis but these are not eliminating the pain and discomfort. The patient continues to use over-the-counter anti-inflammatory medications to treat the pain and related symptoms but this has not given the patient relief. The patient notes the pain in the lower extremities is causing problems with daily exercise, problems at work and even with household activities such as preparing meals and doing dishes.  The patient is otherwise done well and there have been no complications related to the laser procedure or interval changes in the patient's overall   Post laser ultrasound shows successful ablation of the left great saphenous vein.  Noninvasive studies show no evidence of DVT in the right lower extremity.  There is no evidence of chronic venous insufficiency in the deep venous system or venous reflux in the superficial venous system.  No evidence of superficial venous thrombosis bilaterally        Review of Systems  Cardiovascular: Positive for leg swelling.       Tenderness over varicosities of left  leg  Musculoskeletal:       Leg pain  All other systems reviewed and are negative.      Objective:   Physical Exam Vitals reviewed.  HENT:     Head: Normocephalic.  Cardiovascular:     Pulses: Normal pulses.     Comments: Prominent varicosity on left lower extremity Pulmonary:     Effort: Pulmonary effort is normal.  Neurological:     Mental Status: She is alert. Mental status is at baseline.  Psychiatric:        Mood and Affect: Mood normal.        Behavior: Behavior normal.     BP 119/66 (BP Location: Right Arm)   Pulse 92   Resp 18   Ht 5\' 1"  (1.549 m)   Wt 150 lb (68 kg)   BMI 28.34 kg/m   Past Medical History:  Diagnosis Date  . Afib ([place])   . Anxiety   . Anxiety   . Bronchitis    recent  . Cancer ([place])    Lymphoma   . Chronic kidney disease    ckd stage 3  . Congestive heart failure (CHF) ([place])   . Coronary artery disease    Status post stent placement in LAD  . Dysrhythmia   . GERD (gastroesophageal reflux disease)   . Hyperlipemia   . Hypertension   . Hypothyroidism   . Thyroid disease     Social History   Socioeconomic History  . Marital status: Single  Spouse name: Not on file  . Number of children: Not on file  . Years of education: Not on file  . Highest education level: Not on file  Occupational History  . Occupation: retired  Tobacco Use  . Smoking status: Never Smoker  . Smokeless tobacco: Never Used  Substance and Sexual Activity  . Alcohol use: No  . Drug use: No  . Sexual activity: Not on file  Other Topics Concern  . Not on file  Social History Narrative   Lives at home independently. Has a cane to ambulate occasionally. Continues to drive.   Social Determinants of Health   Financial Resource Strain:   . Difficulty of Paying Living Expenses:   Food Insecurity:   . Worried About Charity fundraiser in the Last Year:   . Arboriculturist in the Last Year:   Transportation Needs:   . Film/video editor  (Medical):   Marland Kitchen Lack of Transportation (Non-Medical):   Physical Activity:   . Days of Exercise per Week:   . Minutes of Exercise per Session:   Stress:   . Feeling of Stress :   Social Connections:   . Frequency of Communication with Friends and Family:   . Frequency of Social Gatherings with Friends and Family:   . Attends Religious Services:   . Active Member of Clubs or Organizations:   . Attends Archivist Meetings:   Marland Kitchen Marital Status:   Intimate Partner Violence:   . Fear of Current or Ex-Partner:   . Emotionally Abused:   Marland Kitchen Physically Abused:   . Sexually Abused:     Past Surgical History:  Procedure Laterality Date  . ABDOMINAL HYSTERECTOMY     complete  . CORONARY ANGIOPLASTY     stent  . CORONARY STENT PLACEMENT    . EXCISION OF ABDOMINAL WALL TUMOR Right 05/08/2018   Procedure: EXCISION OF ABDOMINAL WALL-RIGHT;  Surgeon: Herbert Pun, MD;  Location: [facility] ORS;  Service: General;  Laterality: Right;  . INCISIONAL HERNIA REPAIR N/A 05/08/2018   Procedure: LAPAROSCOPIC VS. OPEN INCISIONAL HERNIA REPAIR WITH MESH;  Surgeon: Herbert Pun, MD;  Location: [facility] ORS;  Service: General;  Laterality: N/A;  . VEIN SURGERY     Endovenous ablation of saphenous vein    Family History  Problem Relation Age of Onset  . Heart Problems Mother   . Heart Problems Father   . Heart attack Father   . Diabetes Brother   . Diabetes Brother     Allergies  Allergen Reactions  . Sulfa Antibiotics Hives and Rash       Assessment & Plan:   1. Varicose veins of leg with pain, left Recommend:  The patient has had successful ablation of the previously incompetent saphenous venous system but still has persistent symptoms of pain and swelling that are having a negative impact on daily life and daily activities.  Patient should undergo injection foam sclerotherapy to treat the residual varicosities in the left lower extremity.  The risks, benefits and  alternative therapies were reviewed in detail with the patient.  All questions were answered.  The patient agrees to proceed with sclerotherapy at their convenience.  The patient will continue wearing the graduated compression stockings and using the over-the-counter pain medications to treat her symptoms.       2. Swelling of limb No surgery or intervention at this point in time.    I have reviewed my discussion with the patient regarding venous insufficiency  and secondary lymph edema and why it  causes symptoms. I have discussed with the patient the chronic skin changes that accompany these problems and the long term sequela such as ulceration and infection.  Patient will continue wearing graduated compression stockings class 1 (20-30 mmHg) on a daily basis a prescription was given to the patient to keep this updated. The patient will  put the stockings on first thing in the morning and removing them in the evening. The patient is instructed specifically not to sleep in the stockings.  In addition, behavioral modification including elevation during the day will be continued.  Diet and salt restriction was also discussed.  Previous duplex ultrasound of the lower extremities shows normal deep venous system, superficial reflux was not present in the right lower extremity.     Current Outpatient Medications on File Prior to Visit  Medication Sig Dispense Refill  . albuterol (VENTOLIN HFA) 108 (90 Base) MCG/ACT inhaler Inhale 2 puffs into the lungs every 6 (six) hours as needed for wheezing or shortness of breath. 8 g 0  . allopurinol (ZYLOPRIM) 100 MG tablet Take 100 mg by mouth 2 (two) times daily.   1  . ALPRAZolam (XANAX) 0.5 MG tablet TAKE 1ST TABLET 1 HOUR PRIOR TO PROCEDURE. TAKE 2ND TABLET UPON ARRIVAL    . aspirin EC 81 MG tablet Take 81 mg by mouth daily.    . benzonatate (TESSALON) 200 MG capsule Take 200 mg by mouth 3 (three) times daily as needed for cough.    .  chlorpheniramine-HYDROcodone (TUSSIONEX) 10-8 MG/5ML SUER TAKE 5 MLS BY MOUTH NIGHTLY AS NEEDED FOR COUGH    . cyclobenzaprine (FLEXERIL) 5 MG tablet Take 5 mg by mouth 3 (three) times daily as needed for muscle spasms.     . DULoxetine (CYMBALTA) 20 MG capsule Take 20 mg by mouth daily.    . famotidine (PEPCID) 20 MG tablet Take 20 mg by mouth 2 (two) times daily.  2  . fluticasone (FLONASE) 50 MCG/ACT nasal spray Place 1 spray into the nose daily as needed for allergies.     . furosemide (LASIX) 40 MG tablet     . gabapentin (NEURONTIN) 100 MG capsule Take by mouth.    Marland Kitchen HYDROcodone-acetaminophen (NORCO/VICODIN) 5-325 MG tablet Take 1 tablet by mouth every 6 (six) hours as needed for moderate pain.    Marland Kitchen levothyroxine (SYNTHROID, LEVOTHROID) 50 MCG tablet Take 50 mcg by mouth daily before breakfast.    . lovastatin (MEVACOR) 40 MG tablet Take 40 mg by mouth at bedtime.     . magic mouthwash w/lidocaine SOLN Take 5 mLs by mouth 3 (three) times daily as needed for mouth pain.    . montelukast (SINGULAIR) 10 MG tablet TAKE 1 TABLET BY MOUTH EVERY DAY AT NIGHT    . nitroGLYCERIN (NITROSTAT) 0.4 MG SL tablet Place 0.4 mg under the tongue every 5 (five) minutes x 3 doses as needed for chest pain.     Marland Kitchen ondansetron (ZOFRAN-ODT) 4 MG disintegrating tablet Take 4 mg by mouth every 8 (eight) hours as needed for nausea or vomiting.   0  . pantoprazole (PROTONIX) 40 MG tablet Take 40 mg by mouth daily.    . potassium chloride (K-DUR,KLOR-CON) 10 MEQ tablet Take 10 mEq by mouth daily.     . predniSONE (DELTASONE) 5 MG tablet     . rOPINIRole (REQUIP) 2 MG tablet Take 2 mg by mouth 2 (two) times daily.    Marland Kitchen tiZANidine (ZANAFLEX) 2  MG tablet TAKE 1 TABLET (2 MG TOTAL) BY MOUTH 3 (THREE) TIMES DAILY AS NEEDED    . traMADol (ULTRAM) 50 MG tablet Take 50 mg by mouth 2 (two) times daily as needed for moderate pain.   3  . furosemide (LASIX) 40 MG tablet Take 1 tablet (40 mg total) by mouth daily. 30 tablet 1    No current facility-administered medications on file prior to visit.    There are no Patient Instructions on file for this visit. No follow-ups on file.   Kris Hartmann, NP

## 2019-06-15 ENCOUNTER — Other Ambulatory Visit: Payer: Self-pay

## 2019-06-15 ENCOUNTER — Ambulatory Visit
Admission: RE | Admit: 2019-06-15 | Discharge: 2019-06-15 | Disposition: A | Discharge status: Home / Self Care | Payer: Medicare Other | Source: Ambulatory Visit | Attending: Physician Assistant | Admitting: Physician Assistant

## 2019-06-15 DIAGNOSIS — R490 Dysphonia: Secondary | ICD-10-CM | POA: Insufficient documentation

## 2019-06-15 DIAGNOSIS — R911 Solitary pulmonary nodule: Secondary | ICD-10-CM | POA: Diagnosis not present

## 2019-06-29 ENCOUNTER — Encounter (INDEPENDENT_AMBULATORY_CARE_PROVIDER_SITE_OTHER): Payer: Self-pay | Admitting: Vascular Surgery

## 2019-06-29 ENCOUNTER — Other Ambulatory Visit: Payer: Self-pay

## 2019-06-29 ENCOUNTER — Ambulatory Visit (INDEPENDENT_AMBULATORY_CARE_PROVIDER_SITE_OTHER): Payer: Medicare Other | Admitting: Vascular Surgery

## 2019-06-29 VITALS — BP 129/73 | HR 93 | Ht 61.0 in | Wt 161.0 lb

## 2019-06-29 DIAGNOSIS — I83812 Varicose veins of left lower extremities with pain: Secondary | ICD-10-CM | POA: Diagnosis not present

## 2019-06-29 NOTE — Progress Notes (Signed)
[name] is a 86 y.o.female who presents with painful varicose veins of the left leg  Past Medical History:  Diagnosis Date  . Afib ([place])   . Anxiety   . Anxiety   . Bronchitis    recent  . Cancer ([place])    Lymphoma   . Chronic kidney disease    ckd stage 3  . Congestive heart failure (CHF) ([place])   . Coronary artery disease    Status post stent placement in LAD  . Dysrhythmia   . GERD (gastroesophageal reflux disease)   . Hyperlipemia   . Hypertension   . Hypothyroidism   . Thyroid disease     Past Surgical History:  Procedure Laterality Date  . ABDOMINAL HYSTERECTOMY     complete  . CORONARY ANGIOPLASTY     stent  . CORONARY STENT PLACEMENT    . EXCISION OF ABDOMINAL WALL TUMOR Right 05/08/2018   Procedure: EXCISION OF ABDOMINAL WALL-RIGHT;  Surgeon: Herbert Pun, MD;  Location: [facility] ORS;  Service: General;  Laterality: Right;  . INCISIONAL HERNIA REPAIR N/A 05/08/2018   Procedure: LAPAROSCOPIC VS. OPEN INCISIONAL HERNIA REPAIR WITH MESH;  Surgeon: Herbert Pun, MD;  Location: [facility] ORS;  Service: General;  Laterality: N/A;  . VEIN SURGERY     Endovenous ablation of saphenous vein    Current Outpatient Medications  Medication Sig Dispense Refill  . DULoxetine (CYMBALTA) 20 MG capsule Take 20 mg by mouth daily.    . furosemide (LASIX) 40 MG tablet     . levothyroxine (SYNTHROID, LEVOTHROID) 50 MCG tablet Take 50 mcg by mouth daily before breakfast.    . lovastatin (MEVACOR) 40 MG tablet Take 40 mg by mouth at bedtime.     . pantoprazole (PROTONIX) 40 MG tablet Take 40 mg by mouth daily.    Marland Kitchen rOPINIRole (REQUIP) 2 MG tablet Take 2 mg by mouth 2 (two) times daily.    Marland Kitchen tiZANidine (ZANAFLEX) 2 MG tablet TAKE 1 TABLET (2 MG TOTAL) BY MOUTH 3 (THREE) TIMES DAILY AS NEEDED    . traMADol (ULTRAM) 50 MG tablet Take 50 mg by mouth 2 (two) times daily as needed for moderate pain.   3  . albuterol (VENTOLIN HFA) 108 (90 Base) MCG/ACT inhaler Inhale 2 puffs  into the lungs every 6 (six) hours as needed for wheezing or shortness of breath. (Patient not taking: Reported on 06/29/2019) 8 g 0  . allopurinol (ZYLOPRIM) 100 MG tablet Take 100 mg by mouth 2 (two) times daily.   1  . ALPRAZolam (XANAX) 0.5 MG tablet TAKE 1ST TABLET 1 HOUR PRIOR TO PROCEDURE. TAKE 2ND TABLET UPON ARRIVAL    . aspirin EC 81 MG tablet Take 81 mg by mouth daily.    . benzonatate (TESSALON) 200 MG capsule Take 200 mg by mouth 3 (three) times daily as needed for cough.    . chlorpheniramine-HYDROcodone (TUSSIONEX) 10-8 MG/5ML SUER TAKE 5 MLS BY MOUTH NIGHTLY AS NEEDED FOR COUGH    . cyclobenzaprine (FLEXERIL) 5 MG tablet Take 5 mg by mouth 3 (three) times daily as needed for muscle spasms.     . famotidine (PEPCID) 20 MG tablet Take 20 mg by mouth 2 (two) times daily.  2  . fluticasone (FLONASE) 50 MCG/ACT nasal spray Place 1 spray into the nose daily as needed for allergies.     . furosemide (LASIX) 40 MG tablet Take 1 tablet (40 mg total) by mouth daily. 30 tablet 1  . gabapentin (NEURONTIN) 100  MG capsule Take by mouth.    Marland Kitchen HYDROcodone-acetaminophen (NORCO/VICODIN) 5-325 MG tablet Take 1 tablet by mouth every 6 (six) hours as needed for moderate pain.    . magic mouthwash w/lidocaine SOLN Take 5 mLs by mouth 3 (three) times daily as needed for mouth pain.    . montelukast (SINGULAIR) 10 MG tablet TAKE 1 TABLET BY MOUTH EVERY DAY AT NIGHT    . nitroGLYCERIN (NITROSTAT) 0.4 MG SL tablet Place 0.4 mg under the tongue every 5 (five) minutes x 3 doses as needed for chest pain.     Marland Kitchen ondansetron (ZOFRAN-ODT) 4 MG disintegrating tablet Take 4 mg by mouth every 8 (eight) hours as needed for nausea or vomiting.   0  . potassium chloride (K-DUR,KLOR-CON) 10 MEQ tablet Take 10 mEq by mouth daily.     . predniSONE (DELTASONE) 5 MG tablet      No current facility-administered medications for this visit.    Allergies  Allergen Reactions  . Sulfa Antibiotics Hives and Rash     Indication: Patient presents with symptomatic varicose veins of the left lower extremity.  Procedure: Foam sclerotherapy was performed on the left lower extremity. Using ultrasound guidance, 5 mL of foam Sotradecol was used to inject the varicosities of the left lower extremity. Compression wraps were placed. The patient tolerated the procedure well.

## 2019-07-20 ENCOUNTER — Ambulatory Visit (INDEPENDENT_AMBULATORY_CARE_PROVIDER_SITE_OTHER): Payer: Medicare Other | Admitting: Vascular Surgery

## 2019-07-20 ENCOUNTER — Encounter (INDEPENDENT_AMBULATORY_CARE_PROVIDER_SITE_OTHER): Payer: Self-pay | Admitting: Vascular Surgery

## 2019-07-20 ENCOUNTER — Other Ambulatory Visit: Payer: Self-pay

## 2019-07-20 VITALS — BP 125/69 | HR 88 | Resp 16 | Wt 164.0 lb

## 2019-07-20 DIAGNOSIS — I83812 Varicose veins of left lower extremities with pain: Secondary | ICD-10-CM

## 2019-07-20 NOTE — Progress Notes (Signed)
[name] is a 86 y.o.female who presents with painful varicose veins of the left leg  Past Medical History:  Diagnosis Date  . Afib ([place])   . Anxiety   . Anxiety   . Bronchitis    recent  . Cancer ([place])    Lymphoma   . Chronic kidney disease    ckd stage 3  . Congestive heart failure (CHF) ([place])   . Coronary artery disease    Status post stent placement in LAD  . Dysrhythmia   . GERD (gastroesophageal reflux disease)   . Hyperlipemia   . Hypertension   . Hypothyroidism   . Thyroid disease     Past Surgical History:  Procedure Laterality Date  . ABDOMINAL HYSTERECTOMY     complete  . CORONARY ANGIOPLASTY     stent  . CORONARY STENT PLACEMENT    . EXCISION OF ABDOMINAL WALL TUMOR Right 05/08/2018   Procedure: EXCISION OF ABDOMINAL WALL-RIGHT;  Surgeon: Herbert Pun, MD;  Location: [facility] ORS;  Service: General;  Laterality: Right;  . INCISIONAL HERNIA REPAIR N/A 05/08/2018   Procedure: LAPAROSCOPIC VS. OPEN INCISIONAL HERNIA REPAIR WITH MESH;  Surgeon: Herbert Pun, MD;  Location: [facility];  Service: General;  Laterality: N/A;  . VEIN SURGERY     Endovenous ablation of saphenous vein    Current Outpatient Medications  Medication Sig Dispense Refill  . allopurinol (ZYLOPRIM) 100 MG tablet Take 100 mg by mouth 2 (two) times daily.   1  . ALPRAZolam (XANAX) 0.5 MG tablet TAKE 1ST TABLET 1 HOUR PRIOR TO PROCEDURE. TAKE 2ND TABLET UPON ARRIVAL    . aspirin EC 81 MG tablet Take 81 mg by mouth daily.    . benzonatate (TESSALON) 200 MG capsule Take 200 mg by mouth 3 (three) times daily as needed for cough.    . chlorpheniramine-HYDROcodone (TUSSIONEX) 10-8 MG/5ML SUER TAKE 5 MLS BY MOUTH NIGHTLY AS NEEDED FOR COUGH    . cyclobenzaprine (FLEXERIL) 5 MG tablet Take 5 mg by mouth 3 (three) times daily as needed for muscle spasms.     . DULoxetine (CYMBALTA) 20 MG capsule Take 20 mg by mouth daily.    . famotidine (PEPCID) 20 MG tablet Take 20 mg by mouth 2  (two) times daily.  2  . fluticasone (FLONASE) 50 MCG/ACT nasal spray Place 1 spray into the nose daily as needed for allergies.     . furosemide (LASIX) 40 MG tablet     . gabapentin (NEURONTIN) 100 MG capsule Take by mouth.    Marland Kitchen HYDROcodone-acetaminophen (NORCO/VICODIN) 5-325 MG tablet Take 1 tablet by mouth every 6 (six) hours as needed for moderate pain.    Marland Kitchen levothyroxine (SYNTHROID, LEVOTHROID) 50 MCG tablet Take 50 mcg by mouth daily before breakfast.    . lovastatin (MEVACOR) 40 MG tablet Take 40 mg by mouth at bedtime.     . magic mouthwash w/lidocaine SOLN Take 5 mLs by mouth 3 (three) times daily as needed for mouth pain.    . montelukast (SINGULAIR) 10 MG tablet TAKE 1 TABLET BY MOUTH EVERY DAY AT NIGHT    . nitroGLYCERIN (NITROSTAT) 0.4 MG SL tablet Place 0.4 mg under the tongue every 5 (five) minutes x 3 doses as needed for chest pain.     Marland Kitchen ondansetron (ZOFRAN-ODT) 4 MG disintegrating tablet Take 4 mg by mouth every 8 (eight) hours as needed for nausea or vomiting.   0  . pantoprazole (PROTONIX) 40 MG tablet Take 40 mg by  mouth daily.    . potassium chloride (K-DUR,KLOR-CON) 10 MEQ tablet Take 10 mEq by mouth daily.     . predniSONE (DELTASONE) 5 MG tablet     . rOPINIRole (REQUIP) 2 MG tablet Take 2 mg by mouth 2 (two) times daily.    Marland Kitchen tiZANidine (ZANAFLEX) 2 MG tablet TAKE 1 TABLET (2 MG TOTAL) BY MOUTH 3 (THREE) TIMES DAILY AS NEEDED    . traMADol (ULTRAM) 50 MG tablet Take 50 mg by mouth 2 (two) times daily as needed for moderate pain.   3  . albuterol (VENTOLIN HFA) 108 (90 Base) MCG/ACT inhaler Inhale 2 puffs into the lungs every 6 (six) hours as needed for wheezing or shortness of breath. (Patient not taking: Reported on 06/29/2019) 8 g 0  . furosemide (LASIX) 40 MG tablet Take 1 tablet (40 mg total) by mouth daily. 30 tablet 1   No current facility-administered medications for this visit.    Allergies  Allergen Reactions  . Sulfa Antibiotics Hives and Rash     Indication: Patient presents with symptomatic varicose veins of the left lower extremity.  Procedure: Foam sclerotherapy was performed on the left lower extremity. Using ultrasound guidance, 5 mL of foam Sotradecol was used to inject the varicosities of the left lower extremity. Compression wraps were placed. The patient tolerated the procedure well.

## 2019-07-21 ENCOUNTER — Encounter: Payer: Self-pay | Admitting: Dermatology

## 2019-07-21 ENCOUNTER — Ambulatory Visit (INDEPENDENT_AMBULATORY_CARE_PROVIDER_SITE_OTHER): Payer: Medicare Other | Admitting: Dermatology

## 2019-07-21 DIAGNOSIS — L820 Inflamed seborrheic keratosis: Secondary | ICD-10-CM | POA: Diagnosis not present

## 2019-07-21 DIAGNOSIS — Z85828 Personal history of other malignant neoplasm of skin: Secondary | ICD-10-CM

## 2019-07-21 DIAGNOSIS — L719 Rosacea, unspecified: Secondary | ICD-10-CM

## 2019-07-21 MED ORDER — METRONIDAZOLE 0.75 % EX CREA: TOPICAL_CREAM | CUTANEOUS | 3 refills | Status: DC

## 2019-07-21 NOTE — Progress Notes (Signed)
   Follow-Up Visit   Subjective  [name] is a 86 y.o. female who presents for the following: Pink spots (Has 3 pink spots on tip of her nose for past month, patient concerned, Has an area on B/L temples that have been a little painful after she messed with them. x 1 month or more). They stay irritated. Pink spots on nose have cleared up. HX of [place] 11/11/17 Right knee.  The following portions of the chart were reviewed this encounter and updated as appropriate:      Review of Systems:  No other skin or systemic complaints except as noted in HPI or Assessment and Plan.  Objective  Well appearing patient in no apparent distress; mood and affect are within normal limits.  A focused examination was performed including face, neck, chest and back. Relevant physical exam findings are noted in the Assessment and Plan.  Objective  Right Knee, R nasal dorsum: Well healed scar with no evidence of recurrence.    Objective  Left Forehead x 6 (6), Right Temporal Scalp at hair line: Erythematous keratotic waxy stuck-on papules  Objective  Left Malar Cheek and nose: Resolving pink macules no scale on nose left malar cheek pink macule no scale adjacent to white scar- recheck on follow up    Assessment & Plan  History of basal cell carcinoma (BCC) Right Knee, R nasal dorsum  Clear. Observe for recurrence. Call clinic for new or changing lesions.  Recommend regular skin exams, daily broad-spectrum spf 30+ sunscreen use, and photoprotection.     Inflamed seborrheic keratosis (7) Left Forehead x 6 (6); Right Temporal Scalp at hair line  Cryotherapy Today Prior to procedure, discussed risks of blister formation, small wound, skin dyspigmentation, or rare scar following cryotherapy.     Destruction of lesion - Left Forehead x 6, Right Temporal Scalp at hair line  Destruction method: cryotherapy   Informed consent: discussed and consent obtained   Lesion destroyed using liquid  nitrogen: Yes   Region frozen until ice ball extended beyond lesion: Yes   Outcome: patient tolerated procedure well with no complications   Post-procedure details: wound care instructions given    Rosacea Left Malar Cheek and nose  Metronidazole .75% cream Apply to mid face once to twice daily Sunscreen to face daily  metroNIDAZOLE (METROCREAM) 0.75 % cream - Left Malar Cheek and nose   Return for TBSE in OCT. Marene Lenz, CMA, am acting as scribe for Brendolyn Patty, MD .  Documentation: I have reviewed the above documentation for accuracy and completeness, and I agree with the above.  Brendolyn Patty MD

## 2019-07-21 NOTE — Patient Instructions (Addendum)
Recommend daily broad spectrum sunscreen SPF 30+ to sun-exposed areas, reapply every 2 hours as needed. Call for new or changing lesions.   Prior to procedure, discussed risks of blister formation, small wound, skin dyspigmentation, or rare scar following cryotherapy.  Cryotherapy Aftercare  . Wash gently with soap and water everyday.   Marland Kitchen Apply Vaseline and Band-Aid daily until healed.

## 2019-07-23 ENCOUNTER — Ambulatory Visit: Payer: Medicare Other | Admitting: Dermatology

## 2019-08-10 ENCOUNTER — Encounter (INDEPENDENT_AMBULATORY_CARE_PROVIDER_SITE_OTHER): Payer: Self-pay | Admitting: Vascular Surgery

## 2019-08-10 ENCOUNTER — Ambulatory Visit (INDEPENDENT_AMBULATORY_CARE_PROVIDER_SITE_OTHER): Payer: Medicare Other | Admitting: Vascular Surgery

## 2019-08-10 ENCOUNTER — Other Ambulatory Visit: Payer: Self-pay

## 2019-08-10 DIAGNOSIS — I83813 Varicose veins of bilateral lower extremities with pain: Secondary | ICD-10-CM | POA: Insufficient documentation

## 2019-08-10 DIAGNOSIS — I83811 Varicose veins of right lower extremities with pain: Secondary | ICD-10-CM | POA: Diagnosis not present

## 2019-08-10 DIAGNOSIS — I83819 Varicose veins of unspecified lower extremities with pain: Secondary | ICD-10-CM | POA: Insufficient documentation

## 2019-08-10 NOTE — Progress Notes (Signed)
[name] is a 86 y.o.female who presents with painful varicose veins of the right leg  Past Medical History:  Diagnosis Date  . Afib ([place])   . Anxiety   . Anxiety   . Bronchitis    recent  . Cancer ([place])    Lymphoma   . Chronic kidney disease    ckd stage 3  . Congestive heart failure (CHF) ([place])   . Coronary artery disease    Status post stent placement in LAD  . Dysrhythmia   . GERD (gastroesophageal reflux disease)   . Hyperlipemia   . Hypertension   . Hypothyroidism   . Thyroid disease     Past Surgical History:  Procedure Laterality Date  . ABDOMINAL HYSTERECTOMY     complete  . CORONARY ANGIOPLASTY     stent  . CORONARY STENT PLACEMENT    . EXCISION OF ABDOMINAL WALL TUMOR Right 05/08/2018   Procedure: EXCISION OF ABDOMINAL WALL-RIGHT;  Surgeon: Herbert Pun, MD;  Location: [facility] ORS;  Service: General;  Laterality: Right;  . INCISIONAL HERNIA REPAIR N/A 05/08/2018   Procedure: LAPAROSCOPIC VS. OPEN INCISIONAL HERNIA REPAIR WITH MESH;  Surgeon: Herbert Pun, MD;  Location: [facility] ORS;  Service: General;  Laterality: N/A;  . VEIN SURGERY     Endovenous ablation of saphenous vein    Current Outpatient Medications  Medication Sig Dispense Refill  . albuterol (VENTOLIN HFA) 108 (90 Base) MCG/ACT inhaler Inhale 2 puffs into the lungs every 6 (six) hours as needed for wheezing or shortness of breath. 8 g 0  . allopurinol (ZYLOPRIM) 100 MG tablet Take 100 mg by mouth 2 (two) times daily.   1  . ALPRAZolam (XANAX) 0.5 MG tablet TAKE 1ST TABLET 1 HOUR PRIOR TO PROCEDURE. TAKE 2ND TABLET UPON ARRIVAL    . aspirin EC 81 MG tablet Take 81 mg by mouth daily.    . benzonatate (TESSALON) 200 MG capsule Take 200 mg by mouth 3 (three) times daily as needed for cough.    . chlorpheniramine-HYDROcodone (TUSSIONEX) 10-8 MG/5ML SUER TAKE 5 MLS BY MOUTH NIGHTLY AS NEEDED FOR COUGH    . cyclobenzaprine (FLEXERIL) 5 MG tablet Take 5 mg by mouth 3 (three) times daily as  needed for muscle spasms.     . DULoxetine (CYMBALTA) 20 MG capsule Take 20 mg by mouth daily.    . famotidine (PEPCID) 20 MG tablet Take 20 mg by mouth 2 (two) times daily.  2  . fluticasone (FLONASE) 50 MCG/ACT nasal spray Place 1 spray into the nose daily as needed for allergies.     . furosemide (LASIX) 40 MG tablet     . gabapentin (NEURONTIN) 100 MG capsule Take by mouth.    . levothyroxine (SYNTHROID, LEVOTHROID) 50 MCG tablet Take 50 mcg by mouth daily before breakfast.    . lovastatin (MEVACOR) 40 MG tablet Take 40 mg by mouth at bedtime.     . magic mouthwash w/lidocaine SOLN Take 5 mLs by mouth 3 (three) times daily as needed for mouth pain.    . metroNIDAZOLE (METROCREAM) 0.75 % cream Apply to mid face once to twice daily 45 g 3  . montelukast (SINGULAIR) 10 MG tablet TAKE 1 TABLET BY MOUTH EVERY DAY AT NIGHT    . nitroGLYCERIN (NITROSTAT) 0.4 MG SL tablet Place 0.4 mg under the tongue every 5 (five) minutes x 3 doses as needed for chest pain.     Marland Kitchen ondansetron (ZOFRAN-ODT) 4 MG disintegrating tablet Take 4 mg  by mouth every 8 (eight) hours as needed for nausea or vomiting.   0  . pantoprazole (PROTONIX) 40 MG tablet Take 40 mg by mouth daily.    . potassium chloride (K-DUR,KLOR-CON) 10 MEQ tablet Take 10 mEq by mouth daily.     Marland Kitchen rOPINIRole (REQUIP) 2 MG tablet Take 2 mg by mouth 2 (two) times daily.    Marland Kitchen tiZANidine (ZANAFLEX) 2 MG tablet TAKE 1 TABLET (2 MG TOTAL) BY MOUTH 3 (THREE) TIMES DAILY AS NEEDED    . traMADol (ULTRAM) 50 MG tablet Take 50 mg by mouth 2 (two) times daily as needed for moderate pain.   3  . furosemide (LASIX) 40 MG tablet Take 1 tablet (40 mg total) by mouth daily. 30 tablet 1  . HYDROcodone-acetaminophen (NORCO/VICODIN) 5-325 MG tablet Take 1 tablet by mouth every 6 (six) hours as needed for moderate pain. (Patient not taking: Reported on 08/10/2019)    . predniSONE (DELTASONE) 5 MG tablet  (Patient not taking: Reported on 08/10/2019)     No current  facility-administered medications for this visit.    Allergies  Allergen Reactions  . Sulfa Antibiotics Hives and Rash    Indication: Patient presents with symptomatic varicose veins of the right lower extremity.  Procedure: Foam sclerotherapy was performed on the right lower extremity. Using ultrasound guidance, 5 mL of foam Sotradecol was used to inject the varicosities of the right lower extremity. Compression wraps were placed. The patient tolerated the procedure well.

## 2019-09-07 ENCOUNTER — Ambulatory Visit: Payer: Medicare Other | Admitting: Oncology

## 2019-09-07 ENCOUNTER — Other Ambulatory Visit: Payer: Medicare Other

## 2019-09-09 ENCOUNTER — Encounter: Payer: Self-pay | Admitting: Oncology

## 2019-09-09 ENCOUNTER — Other Ambulatory Visit: Payer: Self-pay

## 2019-09-09 ENCOUNTER — Inpatient Hospital Stay: Payer: Medicare Other | Attending: Oncology

## 2019-09-09 ENCOUNTER — Inpatient Hospital Stay (HOSPITAL_BASED_OUTPATIENT_CLINIC_OR_DEPARTMENT_OTHER): Payer: Medicare Other | Admitting: Oncology

## 2019-09-09 VITALS — BP 124/73 | HR 71 | Temp 96.5°F | Resp 18 | Wt 156.7 lb

## 2019-09-09 DIAGNOSIS — N390 Urinary tract infection, site not specified: Secondary | ICD-10-CM | POA: Insufficient documentation

## 2019-09-09 DIAGNOSIS — B9620 Unspecified Escherichia coli [E. coli] as the cause of diseases classified elsewhere: Secondary | ICD-10-CM | POA: Insufficient documentation

## 2019-09-09 DIAGNOSIS — R911 Solitary pulmonary nodule: Secondary | ICD-10-CM | POA: Diagnosis not present

## 2019-09-09 DIAGNOSIS — Z8579 Personal history of other malignant neoplasms of lymphoid, hematopoietic and related tissues: Secondary | ICD-10-CM | POA: Diagnosis not present

## 2019-09-09 DIAGNOSIS — R1310 Dysphagia, unspecified: Secondary | ICD-10-CM | POA: Diagnosis not present

## 2019-09-09 DIAGNOSIS — Z8572 Personal history of non-Hodgkin lymphomas: Secondary | ICD-10-CM | POA: Insufficient documentation

## 2019-09-09 DIAGNOSIS — E860 Dehydration: Secondary | ICD-10-CM | POA: Insufficient documentation

## 2019-09-09 DIAGNOSIS — R0989 Other specified symptoms and signs involving the circulatory and respiratory systems: Secondary | ICD-10-CM

## 2019-09-09 LAB — COMPREHENSIVE METABOLIC PANEL
ALT: 17 U/L (ref 0–44)
AST: 21 U/L (ref 15–41)
Albumin: 3.8 g/dL (ref 3.5–5.0)
Alkaline Phosphatase: 86 U/L (ref 38–126)
Anion gap: 9 (ref 5–15)
BUN: 20 mg/dL (ref 8–23)
CO2: 29 mmol/L (ref 22–32)
Calcium: 8.9 mg/dL (ref 8.9–10.3)
Chloride: 100 mmol/L (ref 98–111)
Creatinine, Ser: 1.18 mg/dL — ABNORMAL HIGH (ref 0.44–1.00)
GFR calc Af Amer: 49 mL/min — ABNORMAL LOW (ref >60)
GFR calc non Af Amer: 42 mL/min — ABNORMAL LOW (ref >60)
Glucose, Bld: 118 mg/dL — ABNORMAL HIGH (ref 70–99)
Potassium: 4.4 mmol/L (ref 3.5–5.1)
Sodium: 138 mmol/L (ref 135–145)
Total Bilirubin: 0.5 mg/dL (ref 0.3–1.2)
Total Protein: 7.2 g/dL (ref 6.5–8.1)

## 2019-09-09 LAB — CBC WITH DIFFERENTIAL/PLATELET
Abs Immature Granulocytes: 0.02 10*3/uL (ref 0.00–0.07)
Basophils Absolute: 0 10*3/uL (ref 0.0–0.1)
Basophils Relative: 0 %
Eosinophils Absolute: 0.3 10*3/uL (ref 0.0–0.5)
Eosinophils Relative: 5 %
HCT: 37.6 % (ref 36.0–46.0)
Hemoglobin: 12.7 g/dL (ref 12.0–15.0)
Immature Granulocytes: 0 %
Lymphocytes Relative: 29 %
Lymphs Abs: 1.7 10*3/uL (ref 0.7–4.0)
MCH: 30.7 pg (ref 26.0–34.0)
MCHC: 33.8 g/dL (ref 30.0–36.0)
MCV: 90.8 fL (ref 80.0–100.0)
Monocytes Absolute: 0.7 10*3/uL (ref 0.1–1.0)
Monocytes Relative: 12 %
Neutro Abs: 3.1 10*3/uL (ref 1.7–7.7)
Neutrophils Relative %: 54 %
Platelets: 231 10*3/uL (ref 150–400)
RBC: 4.14 MIL/uL (ref 3.87–5.11)
RDW: 14.8 % (ref 11.5–15.5)
WBC: 5.8 10*3/uL (ref 4.0–10.5)
nRBC: 0 % (ref 0.0–0.2)

## 2019-09-09 LAB — TECHNOLOGIST SMEAR REVIEW: Plt Morphology: ADEQUATE

## 2019-09-09 LAB — LACTATE DEHYDROGENASE: LDH: 200 U/L — ABNORMAL HIGH (ref 98–192)

## 2019-09-09 NOTE — Progress Notes (Addendum)
Hematology/Oncology follow up note [facility] Telephone:([phone] [phone]   Patient Care Team: Tracie Harrier, MD as PCP - General (Internal Medicine)  REFERRING PROVIDER: Dr. Ginette Pitman CHIEF COMPLAINTS/REASON FOR VISIT:  Follow up for  history of lymphoma.  HISTORY OF PRESENTING ILLNESS:  [name] is a  86 y.o.  female with PMH listed below who was referred to me for evaluation of neck swelling, history of lymphoma. Patient reports remote history of lymphoma many years ago.  She recalls that she received chemotherapy at that time.  Followed up with Dr. Jeb Levering who retired from our [place] center.  She was recently seen by PCP and has complained about her concerns of bilateral neck swelling for a while. She could not specify when she noticed this change. She reports feeling anxious as her initial symptoms which led to lymphoma diagnosis was neck LN swelling.   obtained patient's previous records in EMR by Dr.Choski. Patient has history of non hodgkin's lymphoma and s/p chemotherapy treatment. Her pathology was not available to me.   [place] thryoid done which showed left mid thyroid nodule, 2.2 cm x 2.1cm x 2.1cm.  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 Afirma GSC benign, ROM 4%.  Patient was referred to endocrinology for further management.   INTERVAL HISTORY [name] is a 86 y.o. female who has above history reviewed by me today presents for follow up visit for history of lymphoma. She reports having difficulty swallowing food for 3-4 weeks. No unintentional weight loss.  Continues to have chest congestion, wheezing.  Denies weight loss, fever, chills, fatigue, night sweats.   Review of Systems  Constitutional: Negative for chills, fever, malaise/fatigue and weight loss.  HENT: Negative for nosebleeds and sore throat.   Eyes: Negative for double vision, photophobia and redness.  Respiratory: Negative for cough,  shortness of breath and wheezing.   Cardiovascular: Negative for chest pain and palpitations.       Congested  Gastrointestinal: Negative for abdominal pain, blood in stool, nausea and vomiting.       Swallowing difficulty  Genitourinary: Negative for dysuria.  Musculoskeletal: Negative for back pain, myalgias and neck pain.  Skin: Negative for itching and rash.  Neurological: Negative for dizziness, tingling and tremors.  Endo/Heme/Allergies: Negative for environmental allergies. Does not bruise/bleed easily.  Psychiatric/Behavioral: Negative for depression and hallucinations.    MEDICAL HISTORY:  Past Medical History:  Diagnosis Date  . Afib ([place])   . Anxiety   . Anxiety   . Bronchitis    recent  . Cancer ([place])    Lymphoma   . Chronic kidney disease    ckd stage 3  . Congestive heart failure (CHF) ([place])   . Coronary artery disease    Status post stent placement in LAD  . Dysrhythmia   . GERD (gastroesophageal reflux disease)   . Hyperlipemia   . Hypertension   . Hypothyroidism   . Thyroid disease     SURGICAL HISTORY: Past Surgical History:  Procedure Laterality Date  . ABDOMINAL HYSTERECTOMY     complete  . CORONARY ANGIOPLASTY     stent  . CORONARY STENT PLACEMENT    . EXCISION OF ABDOMINAL WALL TUMOR Right 05/08/2018   Procedure: EXCISION OF ABDOMINAL WALL-RIGHT;  Surgeon: Herbert Pun, MD;  Location: [facility] ORS;  Service: General;  Laterality: Right;  . INCISIONAL HERNIA REPAIR N/A 05/08/2018   Procedure: LAPAROSCOPIC VS. OPEN INCISIONAL HERNIA REPAIR WITH MESH;  Surgeon: Herbert Pun, MD;  Location:  [facility] ORS;  Service: General;  Laterality: N/A;  . VEIN SURGERY     Endovenous ablation of saphenous vein    SOCIAL HISTORY: Social History   Socioeconomic History  . Marital status: Single    Spouse name: Not on file  . Number of children: Not on file  . Years of education: Not on file  . Highest education level: Not on file  Occupational  History  . Occupation: retired  Tobacco Use  . Smoking status: Never Smoker  . Smokeless tobacco: Never Used  Vaping Use  . Vaping Use: Never used  Substance and Sexual Activity  . Alcohol use: No  . Drug use: No  . Sexual activity: Not on file  Other Topics Concern  . Not on file  Social History Narrative   Lives at home independently. Has a cane to ambulate occasionally. Continues to drive.   Social Determinants of Health   Financial Resource Strain:   . Difficulty of Paying Living Expenses:   Food Insecurity:   . Worried About Charity fundraiser in the Last Year:   . Arboriculturist in the Last Year:   Transportation Needs:   . Film/video editor (Medical):   Marland Kitchen Lack of Transportation (Non-Medical):   Physical Activity:   . Days of Exercise per Week:   . Minutes of Exercise per Session:   Stress:   . Feeling of Stress :   Social Connections:   . Frequency of Communication with Friends and Family:   . Frequency of Social Gatherings with Friends and Family:   . Attends Religious Services:   . Active Member of Clubs or Organizations:   . Attends Archivist Meetings:   Marland Kitchen Marital Status:   Intimate Partner Violence:   . Fear of Current or Ex-Partner:   . Emotionally Abused:   Marland Kitchen Physically Abused:   . Sexually Abused:     FAMILY HISTORY: Family History  Problem Relation Age of Onset  . Heart Problems Mother   . Heart Problems Father   . Heart attack Father   . Diabetes Brother   . Diabetes Brother     ALLERGIES:  is allergic to sulfa antibiotics.  MEDICATIONS:  Current Outpatient Medications  Medication Sig Dispense Refill  . albuterol (VENTOLIN HFA) 108 (90 Base) MCG/ACT inhaler Inhale 2 puffs into the lungs every 6 (six) hours as needed for wheezing or shortness of breath. 8 g 0  . allopurinol (ZYLOPRIM) 100 MG tablet Take 100 mg by mouth 2 (two) times daily.   1  . chlorpheniramine-HYDROcodone (TUSSIONEX) 10-8 MG/5ML SUER TAKE 5 MLS BY MOUTH  NIGHTLY AS NEEDED FOR COUGH    . DULoxetine (CYMBALTA) 20 MG capsule Take 20 mg by mouth daily.    . fluticasone (FLONASE) 50 MCG/ACT nasal spray Place 1 spray into the nose daily as needed for allergies.     . furosemide (LASIX) 40 MG tablet     . levothyroxine (SYNTHROID, LEVOTHROID) 50 MCG tablet Take 50 mcg by mouth daily before breakfast.    . lovastatin (MEVACOR) 40 MG tablet Take 40 mg by mouth at bedtime.     . nitroGLYCERIN (NITROSTAT) 0.4 MG SL tablet Place 0.4 mg under the tongue every 5 (five) minutes x 3 doses as needed for chest pain.     Marland Kitchen ondansetron (ZOFRAN-ODT) 4 MG disintegrating tablet Take 4 mg by mouth every 8 (eight) hours as needed for nausea or vomiting.   0  . oxybutynin (  DITROPAN) 5 MG tablet Take 5 mg by mouth 3 (three) times daily.    . pantoprazole (PROTONIX) 40 MG tablet Take 40 mg by mouth daily.    . potassium chloride (K-DUR,KLOR-CON) 10 MEQ tablet Take 10 mEq by mouth daily.     Marland Kitchen rOPINIRole (REQUIP) 2 MG tablet Take 2 mg by mouth 2 (two) times daily.    Marland Kitchen tiZANidine (ZANAFLEX) 2 MG tablet TAKE 1 TABLET (2 MG TOTAL) BY MOUTH 3 (THREE) TIMES DAILY AS NEEDED    . traMADol (ULTRAM) 50 MG tablet Take 50 mg by mouth 2 (two) times daily as needed for moderate pain.   3  . ALPRAZolam (XANAX) 0.5 MG tablet TAKE 1ST TABLET 1 HOUR PRIOR TO PROCEDURE. TAKE 2ND TABLET UPON ARRIVAL (Patient not taking: Reported on 09/09/2019)    . aspirin EC 81 MG tablet Take 81 mg by mouth daily. (Patient not taking: Reported on 09/09/2019)    . benzonatate (TESSALON) 200 MG capsule Take 200 mg by mouth 3 (three) times daily as needed for cough. (Patient not taking: Reported on 09/09/2019)    . cyclobenzaprine (FLEXERIL) 5 MG tablet Take 5 mg by mouth 3 (three) times daily as needed for muscle spasms.  (Patient not taking: Reported on 09/09/2019)    . famotidine (PEPCID) 20 MG tablet Take 20 mg by mouth 2 (two) times daily. (Patient not taking: Reported on 09/09/2019)  2  . furosemide (LASIX) 40  MG tablet Take 1 tablet (40 mg total) by mouth daily. 30 tablet 1  . gabapentin (NEURONTIN) 100 MG capsule Take by mouth. (Patient not taking: Reported on 09/09/2019)    . HYDROcodone-acetaminophen (NORCO/VICODIN) 5-325 MG tablet Take 1 tablet by mouth every 6 (six) hours as needed for moderate pain. (Patient not taking: Reported on 08/10/2019)    . magic mouthwash w/lidocaine SOLN Take 5 mLs by mouth 3 (three) times daily as needed for mouth pain. (Patient not taking: Reported on 09/09/2019)    . metroNIDAZOLE (METROCREAM) 0.75 % cream Apply to mid face once to twice daily (Patient not taking: Reported on 09/09/2019) 45 g 3  . montelukast (SINGULAIR) 10 MG tablet TAKE 1 TABLET BY MOUTH EVERY DAY AT NIGHT (Patient not taking: Reported on 09/09/2019)    . predniSONE (DELTASONE) 5 MG tablet  (Patient not taking: Reported on 08/10/2019)     No current facility-administered medications for this visit.     PHYSICAL EXAMINATION: ECOG PERFORMANCE STATUS: 1 - Symptomatic but completely ambulatory Vitals:   09/09/19 1155  BP: 124/73  Pulse: 71  Resp: 18  Temp: (!) 96.5 F (35.8 C)   Filed Weights   09/09/19 1155  Weight: 156 lb 11.2 oz (71.1 kg)    Physical Exam Constitutional:      General: She is not in acute distress. HENT:     Head: Normocephalic and atraumatic.  Eyes:     General: No scleral icterus.    Pupils: Pupils are equal, round, and reactive to light.  Neck:     Comments: Left side neck mass Cardiovascular:     Rate and Rhythm: Normal rate and regular rhythm.     Heart sounds: Normal heart sounds.  Pulmonary:     Effort: Pulmonary effort is normal. No respiratory distress.     Breath sounds: No wheezing.  Abdominal:     General: Bowel sounds are normal. There is no distension.     Palpations: Abdomen is soft. There is no mass.     Tenderness: There  is no abdominal tenderness.  Musculoskeletal:        General: No deformity. Normal range of motion.     Cervical back:  Normal range of motion and neck supple.  Skin:    General: Skin is warm and dry.     Findings: No erythema or rash.  Neurological:     Mental Status: She is alert and oriented to person, place, and time.     Cranial Nerves: No cranial nerve deficit.     Coordination: Coordination normal.  Psychiatric:        Mood and Affect: Mood normal.      LABORATORY DATA:  I have reviewed the data as listed Lab Results  Component Value Date   WBC 5.8 09/09/2019   HGB 12.7 09/09/2019   HCT 37.6 09/09/2019   MCV 90.8 09/09/2019   PLT 231 09/09/2019   Recent Labs    03/01/19 1130 03/08/19 0928 09/09/19 1056  NA 137 136 138  K 3.5 4.3 4.4  CL 100 101 100  CO2 27 27 29   GLUCOSE 121* 109* 118*  BUN 21 22 20   CREATININE 1.20* 1.10* 1.18*  CALCIUM 8.9 9.0 8.9  GFRNONAA 41* 46* 42*  GFRAA 48* 53* 49*  PROT  --  7.2 7.2  ALBUMIN  --  3.9 3.8  AST  --  23 21  ALT  --  19 17  ALKPHOS  --  80 86  BILITOT  --  0.6 0.5   RADIOGRAPHIC STUDIES: I have personally reviewed the radiological images as listed and agreed with the findings in the report. 12/08/2017 US thyroid Left mid thyroid nodule (labeled 1) meets criteria for biopsy, as designated by the newly established ACR TI-RADS criteria, and referral for biopsy is recommended.   ASSESSMENT & PLAN:  1. Dysphagia, unspecified type   2. History of lymphoma   3. Chest congestion    History of lymphoma.  Labs are reviewed and discussed with patient. Increased LDH.  Will obtain flowcytometry, uric acid, repeat LDH in 2 weeks.   # Dysphagia, refer to GI.  # chest congestion. She follows up with Dr.Fleming.  .06/02/2018 high resolution CT scan was done in the past which showed moderate patchy air trapping in both lungs, indicated for for small airway disease.  No convincing findings of interstitial lung disease.  Small lung nodule. Differential diagnosis was questionable ILD/fibrosis versus reactive airway disease patient was recommended  to use albuterol and singulair and Flovent. Continue inhalers, continue singulair. Recommend her to further discuss with pulmology.     Orders Placed This Encounter  Procedures  . Technologist smear review    Standing Status:   Future    Number of Occurrences:   1    Standing Expiration Date:   09/08/2020  . Lactate dehydrogenase    Standing Status:   Future    Standing Expiration Date:   09/08/2020  . Flow cytometry panel-leukemia/lymphoma work-up    Standing Status:   Future    Standing Expiration Date:   09/08/2020  . Uric acid    Standing Status:   Future    Standing Expiration Date:   09/08/2020  . CBC with Differential/Platelet    Standing Status:   Future    Standing Expiration Date:   09/08/2020  . Comprehensive metabolic panel    Standing Status:   Future    Standing Expiration Date:   09/08/2020  . Lactate dehydrogenase    Standing Status:   Future  Standing Expiration Date:   09/08/2020  . Technologist smear review    Standing Status:   Future    Standing Expiration Date:   09/08/2020  . Ambulatory referral to Gastroenterology    Referral Priority:   Routine    Referral Type:   Consultation    Referral Reason:   Specialty Services Required    Referred to Provider:   Efrain Sella, MD    Number of Visits Requested:   1    We spent sufficient time to discuss many aspect of care, questions were answered to patient's satisfaction. Follow up in 3 months.   Earlie Server, MD, PhD Hematology Oncology [facility] Pager- [phone] 09/09/2019

## 2019-09-09 NOTE — Progress Notes (Signed)
Patient reports that she has been having trouble swallowing food for 3-4 weeks and feels like she gets choked.

## 2019-09-21 ENCOUNTER — Other Ambulatory Visit: Payer: Self-pay

## 2019-09-21 ENCOUNTER — Emergency Department: Payer: Medicare Other

## 2019-09-21 ENCOUNTER — Emergency Department
Admission: EM | Admit: 2019-09-21 | Discharge: 2019-09-21 | Disposition: A | Discharge status: Home / Self Care | Payer: Medicare Other | Attending: Emergency Medicine | Admitting: Emergency Medicine

## 2019-09-21 ENCOUNTER — Encounter: Payer: Self-pay | Admitting: Emergency Medicine

## 2019-09-21 DIAGNOSIS — R410 Disorientation, unspecified: Secondary | ICD-10-CM | POA: Diagnosis not present

## 2019-09-21 DIAGNOSIS — Z5321 Procedure and treatment not carried out due to patient leaving prior to being seen by health care provider: Secondary | ICD-10-CM | POA: Diagnosis not present

## 2019-09-21 DIAGNOSIS — R451 Restlessness and agitation: Secondary | ICD-10-CM | POA: Insufficient documentation

## 2019-09-21 DIAGNOSIS — I6782 Cerebral ischemia: Secondary | ICD-10-CM | POA: Insufficient documentation

## 2019-09-21 LAB — URINALYSIS, COMPLETE (UACMP) WITH MICROSCOPIC
Bilirubin Urine: NEGATIVE
Glucose, UA: NEGATIVE mg/dL
Ketones, ur: NEGATIVE mg/dL
Nitrite: POSITIVE — AB
Protein, ur: NEGATIVE mg/dL
Specific Gravity, Urine: 1.012 (ref 1.005–1.030)
WBC, UA: >50 WBC/hpf — ABNORMAL HIGH (ref 0–5)
pH: 5 (ref 5.0–8.0)

## 2019-09-21 LAB — CBC WITH DIFFERENTIAL/PLATELET
Abs Immature Granulocytes: 0.05 10*3/uL (ref 0.00–0.07)
Basophils Absolute: 0 10*3/uL (ref 0.0–0.1)
Basophils Relative: 0 %
Eosinophils Absolute: 0.1 10*3/uL (ref 0.0–0.5)
Eosinophils Relative: 1 %
HCT: 33.6 % — ABNORMAL LOW (ref 36.0–46.0)
Hemoglobin: 11.6 g/dL — ABNORMAL LOW (ref 12.0–15.0)
Immature Granulocytes: 1 %
Lymphocytes Relative: 19 %
Lymphs Abs: 1.9 10*3/uL (ref 0.7–4.0)
MCH: 31.1 pg (ref 26.0–34.0)
MCHC: 34.5 g/dL (ref 30.0–36.0)
MCV: 90.1 fL (ref 80.0–100.0)
Monocytes Absolute: 1.6 10*3/uL — ABNORMAL HIGH (ref 0.1–1.0)
Monocytes Relative: 16 %
Neutro Abs: 6.3 10*3/uL (ref 1.7–7.7)
Neutrophils Relative %: 63 %
Platelets: 227 10*3/uL (ref 150–400)
RBC: 3.73 MIL/uL — ABNORMAL LOW (ref 3.87–5.11)
RDW: 14.7 % (ref 11.5–15.5)
WBC: 9.9 10*3/uL (ref 4.0–10.5)
nRBC: 0 % (ref 0.0–0.2)

## 2019-09-21 LAB — COMPREHENSIVE METABOLIC PANEL
ALT: 17 U/L (ref 0–44)
AST: 26 U/L (ref 15–41)
Albumin: 3.6 g/dL (ref 3.5–5.0)
Alkaline Phosphatase: 60 U/L (ref 38–126)
Anion gap: 12 (ref 5–15)
BUN: 26 mg/dL — ABNORMAL HIGH (ref 8–23)
CO2: 23 mmol/L (ref 22–32)
Calcium: 8.4 mg/dL — ABNORMAL LOW (ref 8.9–10.3)
Chloride: 96 mmol/L — ABNORMAL LOW (ref 98–111)
Creatinine, Ser: 1.49 mg/dL — ABNORMAL HIGH (ref 0.44–1.00)
GFR calc Af Amer: 37 mL/min — ABNORMAL LOW (ref >60)
GFR calc non Af Amer: 32 mL/min — ABNORMAL LOW (ref >60)
Glucose, Bld: 89 mg/dL (ref 70–99)
Potassium: 3.5 mmol/L (ref 3.5–5.1)
Sodium: 131 mmol/L — ABNORMAL LOW (ref 135–145)
Total Bilirubin: 1.6 mg/dL — ABNORMAL HIGH (ref 0.3–1.2)
Total Protein: 7 g/dL (ref 6.5–8.1)

## 2019-09-21 LAB — TROPONIN I (HIGH SENSITIVITY): Troponin I (High Sensitivity): 11 ng/L (ref <18)

## 2019-09-21 LAB — PROTIME-INR
INR: 1.1 (ref 0.8–1.2)
Prothrombin Time: 14.1 seconds (ref 11.4–15.2)

## 2019-09-21 LAB — LACTIC ACID, PLASMA: Lactic Acid, Venous: 0.9 mmol/L (ref 0.5–1.9)

## 2019-09-21 MED ORDER — ACETAMINOPHEN 325 MG PO TABS
650.0000 mg | ORAL_TABLET | Freq: Once | ORAL | Status: AC | PRN
  Administered 2019-09-21: 650 mg via ORAL
  Filled 2019-09-21: qty 2

## 2019-09-21 NOTE — ED Triage Notes (Signed)
Pt arrived via [facility] with daughter comes from [facility], reports confusion and agitation.  Daughter states pt was trying to figure out how to get in her car but the door was open.  Reports pt has been more fidgety.  Pt seen earlier this week for sore throat at [facility], taking antibiotics.  Family first noted confusion around 3pm. Pt was last seen normal by family over the weekend.

## 2019-09-23 ENCOUNTER — Inpatient Hospital Stay: Payer: Medicare Other

## 2019-09-23 ENCOUNTER — Other Ambulatory Visit: Payer: Self-pay

## 2019-09-23 ENCOUNTER — Telehealth: Payer: Self-pay

## 2019-09-23 ENCOUNTER — Inpatient Hospital Stay (HOSPITAL_BASED_OUTPATIENT_CLINIC_OR_DEPARTMENT_OTHER): Payer: Medicare Other | Admitting: Oncology

## 2019-09-23 ENCOUNTER — Inpatient Hospital Stay: Payer: Medicare Other | Admitting: Oncology

## 2019-09-23 VITALS — BP 126/61 | HR 94 | Temp 100.7°F | Resp 18 | Wt 156.4 lb

## 2019-09-23 DIAGNOSIS — Z8579 Personal history of other malignant neoplasms of lymphoid, hematopoietic and related tissues: Secondary | ICD-10-CM

## 2019-09-23 DIAGNOSIS — R410 Disorientation, unspecified: Secondary | ICD-10-CM

## 2019-09-23 DIAGNOSIS — R300 Dysuria: Secondary | ICD-10-CM

## 2019-09-23 DIAGNOSIS — Z8572 Personal history of non-Hodgkin lymphomas: Secondary | ICD-10-CM | POA: Diagnosis not present

## 2019-09-23 LAB — CBC WITH DIFFERENTIAL/PLATELET
Abs Immature Granulocytes: 0.05 10*3/uL (ref 0.00–0.07)
Basophils Absolute: 0 10*3/uL (ref 0.0–0.1)
Basophils Relative: 0 %
Eosinophils Absolute: 0.1 10*3/uL (ref 0.0–0.5)
Eosinophils Relative: 1 %
HCT: 34.1 % — ABNORMAL LOW (ref 36.0–46.0)
Hemoglobin: 11.6 g/dL — ABNORMAL LOW (ref 12.0–15.0)
Immature Granulocytes: 1 %
Lymphocytes Relative: 12 %
Lymphs Abs: 1.1 10*3/uL (ref 0.7–4.0)
MCH: 30.4 pg (ref 26.0–34.0)
MCHC: 34 g/dL (ref 30.0–36.0)
MCV: 89.5 fL (ref 80.0–100.0)
Monocytes Absolute: 1.1 10*3/uL — ABNORMAL HIGH (ref 0.1–1.0)
Monocytes Relative: 12 %
Neutro Abs: 7.2 10*3/uL (ref 1.7–7.7)
Neutrophils Relative %: 74 %
Platelets: 285 10*3/uL (ref 150–400)
RBC: 3.81 MIL/uL — ABNORMAL LOW (ref 3.87–5.11)
RDW: 14.3 % (ref 11.5–15.5)
WBC: 9.6 10*3/uL (ref 4.0–10.5)
nRBC: 0 % (ref 0.0–0.2)

## 2019-09-23 LAB — COMPREHENSIVE METABOLIC PANEL
ALT: 17 U/L (ref 0–44)
AST: 26 U/L (ref 15–41)
Albumin: 3.4 g/dL — ABNORMAL LOW (ref 3.5–5.0)
Alkaline Phosphatase: 72 U/L (ref 38–126)
Anion gap: 12 (ref 5–15)
BUN: 32 mg/dL — ABNORMAL HIGH (ref 8–23)
CO2: 25 mmol/L (ref 22–32)
Calcium: 8.7 mg/dL — ABNORMAL LOW (ref 8.9–10.3)
Chloride: 96 mmol/L — ABNORMAL LOW (ref 98–111)
Creatinine, Ser: 1.52 mg/dL — ABNORMAL HIGH (ref 0.44–1.00)
GFR calc Af Amer: 36 mL/min — ABNORMAL LOW (ref >60)
GFR calc non Af Amer: 31 mL/min — ABNORMAL LOW (ref >60)
Glucose, Bld: 154 mg/dL — ABNORMAL HIGH (ref 70–99)
Potassium: 3.5 mmol/L (ref 3.5–5.1)
Sodium: 133 mmol/L — ABNORMAL LOW (ref 135–145)
Total Bilirubin: 1 mg/dL (ref 0.3–1.2)
Total Protein: 7.2 g/dL (ref 6.5–8.1)

## 2019-09-23 LAB — URINALYSIS, COMPLETE (UACMP) WITH MICROSCOPIC
Bilirubin Urine: NEGATIVE
Glucose, UA: NEGATIVE mg/dL
Ketones, ur: NEGATIVE mg/dL
Nitrite: POSITIVE — AB
Protein, ur: NEGATIVE mg/dL
Specific Gravity, Urine: 1.015 (ref 1.005–1.030)
WBC, UA: >50 WBC/hpf — ABNORMAL HIGH (ref 0–5)
pH: 5 (ref 5.0–8.0)

## 2019-09-23 LAB — LACTATE DEHYDROGENASE: LDH: 267 U/L — ABNORMAL HIGH (ref 98–192)

## 2019-09-23 LAB — URIC ACID: Uric Acid, Serum: 9.1 mg/dL — ABNORMAL HIGH (ref 2.5–7.1)

## 2019-09-23 MED ORDER — SODIUM CHLORIDE 0.9 % IV SOLN
Freq: Once | INTRAVENOUS | Status: AC
  Filled 2019-09-23: qty 250

## 2019-09-23 MED ORDER — NITROFURANTOIN MONOHYD MACRO 100 MG PO CAPS: 100.0000 mg | ORAL_CAPSULE | Freq: Two times a day (BID) | ORAL | 0 refills | Status: DC

## 2019-09-23 NOTE — Progress Notes (Signed)
Pt is confused at time, weak and she can't walk straight, she says she did not sleep good last night. She went to ER and waiting a long time and then left.

## 2019-09-23 NOTE — Progress Notes (Signed)
Symptom Management Consult note [facility]  [phone] [phone]  Patient Care Team: Tracie Harrier, MD as PCP - General (Internal Medicine)   Name of the patient: [name]  [id number]  Apr 15, 1932   Date of visit: 09/23/2019   Diagnosis- 1. History of lymphoma - CBC with Differential; Future - Comprehensive metabolic panel; Future  2. Dysuria - Urinalysis, Complete w Microscopic; Future - Urine culture; Future   Chief complaint/ Reason for visit- Confusion  Heme/Onc history:  Oncology History   No history exists.   Interval history-[name] is an 86 year old female with past medical history significant for atrial fibrillation, CAD, COPD, GERD with esophagitis, radiculitis, stage III chronic kidney disease and history of lymphoma who is currently being treated for recurrent lymphoma. She recently presented to her PCP for neck swelling and referred to Dr. Tasia Catchings for management.  She has had persistent dysphagia, cough, congestion and shortness of breath with exertion since neck swelling started-approximately 1 to 2 months ago. She was referred to GI for her dysphagia and asked to follow-up with Dr. Raul Del for her chest congestion.   She was evaluated in the emergency room on 09/21/2019 for confusion. She presented with her daughter. Due to long wait times, she was never seen by a physician but did have a chest x-ray and CT head without contrast along with lab work. Chest x-ray and CT was negative for acute abnormality but labs did reveal a urinary tract infection. Unfortunately, patient left before being seen so no antibiotics were prescribed.  Today, patient presents for additional lab work for possible recurrence of her lymphoma. While waiting for her lab work, patient was found talking to herself and confused. She was approached by one of our nursing staff and escorted to an exam room for evalaution.  She reports feeling  fatigued and weak. She continues to complain of problems swallowing, sore throat and pain in her neck area. She denies any urinary concerns. She is having normal bowel movements. She has chest congestion with cough but is unable to produce sputum. She denies a fever. Reports a stable appetite.  ECOG FS:1 - Symptomatic but completely ambulatory  Review of systems- Review of Systems  Constitutional: Positive for fever and malaise/fatigue. Negative for chills and weight loss.  HENT: Positive for sore throat. Negative for congestion, ear pain and tinnitus.   Eyes: Negative.  Negative for blurred vision and double vision.  Respiratory: Positive for cough and shortness of breath. Negative for sputum production.   Cardiovascular: Negative.  Negative for chest pain, palpitations and leg swelling.  Gastrointestinal: Negative.  Negative for abdominal pain, constipation, diarrhea, nausea and vomiting.  Genitourinary: Negative for dysuria, frequency and urgency.  Musculoskeletal: Negative for back pain and falls.  Skin: Negative.  Negative for rash.  Neurological: Positive for weakness. Negative for headaches.  Endo/Heme/Allergies: Negative.  Does not bruise/bleed easily.  Psychiatric/Behavioral: Negative for depression. The patient is nervous/anxious. The patient does not have insomnia.      Current treatment-observation  Allergies  Allergen Reactions  . Sulfa Antibiotics Hives and Rash     Past Medical History:  Diagnosis Date  . Afib ([place])   . Anxiety   . Anxiety   . Bronchitis    recent  . Cancer ([place])    Lymphoma   . Chronic kidney disease    ckd stage 3  . Congestive heart failure (CHF) ([place])   . Coronary artery disease    Status post stent  placement in LAD  . Dysrhythmia   . GERD (gastroesophageal reflux disease)   . Hyperlipemia   . Hypertension   . Hypothyroidism   . Thyroid disease      Past Surgical History:  Procedure Laterality Date  . ABDOMINAL HYSTERECTOMY      complete  . CORONARY ANGIOPLASTY     stent  . CORONARY STENT PLACEMENT    . EXCISION OF ABDOMINAL WALL TUMOR Right 05/08/2018   Procedure: EXCISION OF ABDOMINAL WALL-RIGHT;  Surgeon: Herbert Pun, MD;  Location: [facility] ORS;  Service: General;  Laterality: Right;  . INCISIONAL HERNIA REPAIR N/A 05/08/2018   Procedure: LAPAROSCOPIC VS. OPEN INCISIONAL HERNIA REPAIR WITH MESH;  Surgeon: Herbert Pun, MD;  Location: [facility] ORS;  Service: General;  Laterality: N/A;  . VEIN SURGERY     Endovenous ablation of saphenous vein    Social History   Socioeconomic History  . Marital status: Single    Spouse name: Not on file  . Number of children: Not on file  . Years of education: Not on file  . Highest education level: Not on file  Occupational History  . Occupation: retired  Tobacco Use  . Smoking status: Never Smoker  . Smokeless tobacco: Never Used  Vaping Use  . Vaping Use: Never used  Substance and Sexual Activity  . Alcohol use: No  . Drug use: No  . Sexual activity: Not on file  Other Topics Concern  . Not on file  Social History Narrative   Lives at home independently. Has a cane to ambulate occasionally. Continues to drive.   Social Determinants of Health   Financial Resource Strain:   . Difficulty of Paying Living Expenses:   Food Insecurity:   . Worried About Charity fundraiser in the Last Year:   . Arboriculturist in the Last Year:   Transportation Needs:   . Film/video editor (Medical):   Marland Kitchen Lack of Transportation (Non-Medical):   Physical Activity:   . Days of Exercise per Week:   . Minutes of Exercise per Session:   Stress:   . Feeling of Stress :   Social Connections:   . Frequency of Communication with Friends and Family:   . Frequency of Social Gatherings with Friends and Family:   . Attends Religious Services:   . Active Member of Clubs or Organizations:   . Attends Archivist Meetings:   Marland Kitchen Marital Status:   Intimate Partner  Violence:   . Fear of Current or Ex-Partner:   . Emotionally Abused:   Marland Kitchen Physically Abused:   . Sexually Abused:     Family History  Problem Relation Age of Onset  . Heart Problems Mother   . Heart Problems Father   . Heart attack Father   . Diabetes Brother   . Diabetes Brother      Current Outpatient Medications:  .  albuterol (VENTOLIN HFA) 108 (90 Base) MCG/ACT inhaler, Inhale 2 puffs into the lungs every 6 (six) hours as needed for wheezing or shortness of breath., Disp: 8 g, Rfl: 0 .  allopurinol (ZYLOPRIM) 100 MG tablet, Take 100 mg by mouth 2 (two) times daily. , Disp: , Rfl: 1 .  chlorpheniramine-HYDROcodone (TUSSIONEX) 10-8 MG/5ML SUER, TAKE 5 MLS BY MOUTH NIGHTLY AS NEEDED FOR COUGH, Disp: , Rfl:  .  cyclobenzaprine (FLEXERIL) 5 MG tablet, Take 5 mg by mouth 3 (three) times daily as needed for muscle spasms. , Disp: , Rfl:  .  famotidine (PEPCID) 20 MG tablet, Take 20 mg by mouth 2 (two) times daily. , Disp: , Rfl: 2 .  fluticasone (FLONASE) 50 MCG/ACT nasal spray, Place 1 spray into the nose daily as needed for allergies. , Disp: , Rfl:  .  furosemide (LASIX) 40 MG tablet, , Disp: , Rfl:  .  HYDROcodone-acetaminophen (NORCO/VICODIN) 5-325 MG tablet, Take 1 tablet by mouth every 6 (six) hours as needed for moderate pain. , Disp: , Rfl:  .  lovastatin (MEVACOR) 40 MG tablet, Take 40 mg by mouth at bedtime. , Disp: , Rfl:  .  metroNIDAZOLE (METROCREAM) 0.75 % cream, Apply to mid face once to twice daily, Disp: 45 g, Rfl: 3 .  montelukast (SINGULAIR) 10 MG tablet, TAKE 1 TABLET BY MOUTH EVERY DAY AT NIGHT, Disp: , Rfl:  .  nitroGLYCERIN (NITROSTAT) 0.4 MG SL tablet, Place 0.4 mg under the tongue every 5 (five) minutes x 3 doses as needed for chest pain. , Disp: , Rfl:  .  ondansetron (ZOFRAN-ODT) 4 MG disintegrating tablet, Take 4 mg by mouth every 8 (eight) hours as needed for nausea or vomiting. , Disp: , Rfl: 0 .  oxybutynin (DITROPAN) 5 MG tablet, Take 5 mg by mouth 3  (three) times daily., Disp: , Rfl:  .  pantoprazole (PROTONIX) 40 MG tablet, Take 40 mg by mouth daily., Disp: , Rfl:  .  potassium chloride (K-DUR,KLOR-CON) 10 MEQ tablet, Take 10 mEq by mouth daily. , Disp: , Rfl:  .  rOPINIRole (REQUIP) 2 MG tablet, Take 2 mg by mouth 2 (two) times daily., Disp: , Rfl:  .  tiZANidine (ZANAFLEX) 2 MG tablet, TAKE 1 TABLET (2 MG TOTAL) BY MOUTH 3 (THREE) TIMES DAILY AS NEEDED, Disp: , Rfl:  .  traMADol (ULTRAM) 50 MG tablet, Take 50 mg by mouth 2 (two) times daily as needed for moderate pain. , Disp: , Rfl: 3 .  ALPRAZolam (XANAX) 0.5 MG tablet, TAKE 1ST TABLET 1 HOUR PRIOR TO PROCEDURE. TAKE 2ND TABLET UPON ARRIVAL (Patient not taking: Reported on 09/09/2019), Disp: , Rfl:  .  aspirin EC 81 MG tablet, Take 81 mg by mouth daily. (Patient not taking: Reported on 09/09/2019), Disp: , Rfl:  .  benzonatate (TESSALON) 200 MG capsule, Take 200 mg by mouth 3 (three) times daily as needed for cough. (Patient not taking: Reported on 09/09/2019), Disp: , Rfl:  .  DULoxetine (CYMBALTA) 20 MG capsule, Take 20 mg by mouth daily. (Patient not taking: Reported on 09/23/2019), Disp: , Rfl:  .  furosemide (LASIX) 40 MG tablet, Take 1 tablet (40 mg total) by mouth daily., Disp: 30 tablet, Rfl: 1 .  gabapentin (NEURONTIN) 100 MG capsule, Take by mouth. (Patient not taking: Reported on 09/09/2019), Disp: , Rfl:  .  levothyroxine (SYNTHROID, LEVOTHROID) 50 MCG tablet, Take 50 mcg by mouth daily before breakfast., Disp: , Rfl:  .  magic mouthwash w/lidocaine SOLN, Take 5 mLs by mouth 3 (three) times daily as needed for mouth pain. (Patient not taking: Reported on 09/09/2019), Disp: , Rfl:  .  predniSONE (DELTASONE) 5 MG tablet, , Disp: , Rfl:  No current facility-administered medications for this visit.  Facility-Administered Medications Ordered in Other Visits:  .  0.9 %  sodium chloride infusion, , Intravenous, Once, Jacquelin Hawking, NP, Last Rate: 999 mL/hr at 09/23/19 1227, New Bag at  09/23/19 1227  Physical exam:  Vitals:   09/23/19 1207  BP: (!) 126/61  Pulse: 94  Resp: 18  Temp: Marland Kitchen)  100.7 F (38.2 C)  TempSrc: Tympanic  Weight: 156 lb 6.4 oz (70.9 kg)   Physical Exam Constitutional:      Appearance: Normal appearance.  HENT:     Head: Normocephalic and atraumatic.  Eyes:     Pupils: Pupils are equal, round, and reactive to light.  Cardiovascular:     Rate and Rhythm: Normal rate and regular rhythm.     Heart sounds: Normal heart sounds. No murmur heard.   Pulmonary:     Effort: Pulmonary effort is normal.     Breath sounds: Normal breath sounds. No wheezing.  Abdominal:     General: Bowel sounds are normal. There is no distension.     Palpations: Abdomen is soft.     Tenderness: There is no abdominal tenderness.  Musculoskeletal:        General: Normal range of motion.     Cervical back: Normal range of motion.  Skin:    General: Skin is warm and dry.     Findings: No rash.  Neurological:     Mental Status: She is alert and oriented to person, place, and time. She is confused.  Psychiatric:        Judgment: Judgment normal.      CMP Latest Ref Rng & Units 09/23/2019  Glucose 70 - 99 mg/dL 154(H)  BUN 8 - 23 mg/dL 32(H)  Creatinine 0.44 - 1.00 mg/dL 1.52(H)  Sodium 135 - 145 mmol/L 133(L)  Potassium 3.5 - 5.1 mmol/L 3.5  Chloride 98 - 111 mmol/L 96(L)  CO2 22 - 32 mmol/L 25  Calcium 8.9 - 10.3 mg/dL 8.7(L)  Total Protein 6.5 - 8.1 g/dL 7.2  Total Bilirubin 0.3 - 1.2 mg/dL 1.0  Alkaline Phos 38 - 126 U/L 72  AST 15 - 41 U/L 26  ALT 0 - 44 U/L 17   CBC Latest Ref Rng & Units 09/23/2019  WBC 4.0 - 10.5 K/uL 9.6  Hemoglobin 12.0 - 15.0 g/dL 11.6(L)  Hematocrit 36 - 46 % 34.1(L)  Platelets 150 - 400 K/uL 285    No images are attached to the encounter.  DG Chest 2 View  Result Date: 09/21/2019 CLINICAL DATA:  Cough and fever EXAM: CHEST - 2 VIEW COMPARISON:  06/15/2019 FINDINGS: Cardiac shadow is within normal limits. The lungs are  well aerated bilaterally. Mild interstitial markings are noted of a chronic nature. No focal infiltrate or sizable effusion is seen. Degenerative changes of the thoracic spine are noted. IMPRESSION: No acute abnormality noted. Electronically Signed   By: Inez Catalina M.D.   On: 09/21/2019 19:41   CT Head Wo Contrast  Result Date: 09/21/2019 CLINICAL DATA:  Increasing confusion and agitation EXAM: CT HEAD WITHOUT CONTRAST TECHNIQUE: Contiguous axial images were obtained from the base of the skull through the vertex without intravenous contrast. COMPARISON:  08/17/2016 FINDINGS: Brain: No evidence of acute infarction, hemorrhage, hydrocephalus, extra-axial collection or mass lesion/mass effect. Mild atrophic changes and chronic white matter ischemic changes are noted. Vascular: No hyperdense vessel or unexpected calcification. Skull: Normal. Negative for fracture or focal lesion. Sinuses/Orbits: No acute finding. Other: None. IMPRESSION: Chronic atrophic and ischemic changes without acute abnormality. Electronically Signed   By: Inez Catalina M.D.   On: 09/21/2019 19:43     Assessment and plan- Patient is a 86 y.o. female who presents to symptom management today for additional lab work for history of lymphoma and found to be confused.  After chart review, patient has urinary tract infection which was  discovered when she was evaluated in the emergency room 2 days ago. Unfortunately, patient left before being seen due to long wait times. Will bring patient up to [facility] to receive some IV fluids and will prescribe her an antibiotic. I have called her daughter [name] who will pick her up from the cancer center and take her to the pharmacy to get her antibiotic to start this evening. Will repeat labs, urinalysis with urine culture.   Dehydration: Secondary to UTI and confusion.  We will give IV fluids.  Creatinine 1.52.  Baseline fluctuating between 1-1.4.  Plan: IVF Labs-UA/UC Rx nitrofurantoin 100 mg twice  daily x7 days.  Addendum: Urinalysis positive for UTI.  Culture positive for E. coli.  Nitrofurantoin will cover.   Visit Diagnosis 1. History of lymphoma   2. Dysuria     Patient expressed understanding and was in agreement with this plan. She also understands that She can call clinic at any time with any questions, concerns, or complaints.   Greater than 50% was spent in counseling and coordination of care with this patient including but not limited to discussion of the relevant topics above (See A&P) including, but not limited to diagnosis and management of acute and chronic medical conditions.   Thank you for allowing me to participate in the care of this very pleasant patient.    Jacquelin Hawking, NP [place] [facility] Cell - [phone] Pager- [phone] 09/23/2019 12:54 PM   CC: Dr. Ginette Pitman

## 2019-09-23 NOTE — Telephone Encounter (Signed)
[name] received from registration [name] that she was concerned about patient being confused.  While registering patient for her lab appt today she seemed very confused and had to ask for help to open a water bottle.  I reviewed her chart and she was seen at her PCP on 09/21/19 who advised her to go to ER due to alter mental status (was accompanied by her daughter).  Patient and her daughter did go to the ER but left before getting results of the tests that were performed.    Spoke with Tamara Alstrom, NP about patient and her status.  Patient was added to [facility] for evaluation and IVF.  I asked patient if she would like for me to call her daughter and update her but she prefers I not call at this time.

## 2019-09-25 LAB — URINE CULTURE: Culture: >=100000 — AB

## 2019-09-26 LAB — CULTURE, BLOOD (ROUTINE X 2)
Culture: NO GROWTH
Special Requests: ADEQUATE

## 2019-09-26 NOTE — Progress Notes (Signed)
Hey Dr. Tasia Catchings, I saw this patient last week due to confusion.  She was apparently evaluated in the emergency room and had a UA, CT of the head and lab work but left before being seen due to long wait times.  She came to our clinic for additional lab work that had already been scheduled and found to be confused.  I saw her and rechecked her labs plus urinalysis.  I gave her some IV fluids and started her on antibiotics.  Just wanted to let you know. Faythe Casa, NP 09/26/2019 11:06 AM

## 2019-09-27 ENCOUNTER — Other Ambulatory Visit: Payer: Self-pay | Admitting: Oncology

## 2019-09-28 ENCOUNTER — Inpatient Hospital Stay: Payer: Medicare Other

## 2019-09-28 ENCOUNTER — Encounter: Payer: Self-pay | Admitting: Oncology

## 2019-09-28 ENCOUNTER — Inpatient Hospital Stay: Payer: Medicare Other | Attending: Oncology

## 2019-09-28 ENCOUNTER — Inpatient Hospital Stay (HOSPITAL_BASED_OUTPATIENT_CLINIC_OR_DEPARTMENT_OTHER): Payer: Medicare Other | Admitting: Oncology

## 2019-09-28 ENCOUNTER — Telehealth: Payer: Self-pay

## 2019-09-28 ENCOUNTER — Other Ambulatory Visit: Payer: Self-pay

## 2019-09-28 VITALS — BP 117/70 | HR 80 | Temp 97.4°F | Resp 20 | Wt 156.0 lb

## 2019-09-28 DIAGNOSIS — I509 Heart failure, unspecified: Secondary | ICD-10-CM | POA: Diagnosis not present

## 2019-09-28 DIAGNOSIS — Z8579 Personal history of other malignant neoplasms of lymphoid, hematopoietic and related tissues: Secondary | ICD-10-CM | POA: Diagnosis not present

## 2019-09-28 DIAGNOSIS — N3000 Acute cystitis without hematuria: Secondary | ICD-10-CM | POA: Diagnosis not present

## 2019-09-28 DIAGNOSIS — I4891 Unspecified atrial fibrillation: Secondary | ICD-10-CM | POA: Diagnosis not present

## 2019-09-28 DIAGNOSIS — N179 Acute kidney failure, unspecified: Secondary | ICD-10-CM | POA: Insufficient documentation

## 2019-09-28 DIAGNOSIS — E039 Hypothyroidism, unspecified: Secondary | ICD-10-CM | POA: Diagnosis not present

## 2019-09-28 DIAGNOSIS — R1310 Dysphagia, unspecified: Secondary | ICD-10-CM | POA: Insufficient documentation

## 2019-09-28 DIAGNOSIS — Z79899 Other long term (current) drug therapy: Secondary | ICD-10-CM | POA: Insufficient documentation

## 2019-09-28 DIAGNOSIS — E079 Disorder of thyroid, unspecified: Secondary | ICD-10-CM | POA: Insufficient documentation

## 2019-09-28 DIAGNOSIS — I130 Hypertensive heart and chronic kidney disease with heart failure and stage 1 through stage 4 chronic kidney disease, or unspecified chronic kidney disease: Secondary | ICD-10-CM | POA: Diagnosis not present

## 2019-09-28 LAB — CBC WITH DIFFERENTIAL/PLATELET
Abs Immature Granulocytes: 0.07 10*3/uL (ref 0.00–0.07)
Basophils Absolute: 0 10*3/uL (ref 0.0–0.1)
Basophils Relative: 0 %
Eosinophils Absolute: 0.2 10*3/uL (ref 0.0–0.5)
Eosinophils Relative: 2 %
HCT: 32.3 % — ABNORMAL LOW (ref 36.0–46.0)
Hemoglobin: 10.9 g/dL — ABNORMAL LOW (ref 12.0–15.0)
Immature Granulocytes: 1 %
Lymphocytes Relative: 15 %
Lymphs Abs: 1.6 10*3/uL (ref 0.7–4.0)
MCH: 30.4 pg (ref 26.0–34.0)
MCHC: 33.7 g/dL (ref 30.0–36.0)
MCV: 90 fL (ref 80.0–100.0)
Monocytes Absolute: 0.9 10*3/uL (ref 0.1–1.0)
Monocytes Relative: 9 %
Neutro Abs: 7.9 10*3/uL — ABNORMAL HIGH (ref 1.7–7.7)
Neutrophils Relative %: 73 %
Platelets: 406 10*3/uL — ABNORMAL HIGH (ref 150–400)
RBC: 3.59 MIL/uL — ABNORMAL LOW (ref 3.87–5.11)
RDW: 14 % (ref 11.5–15.5)
WBC: 10.7 10*3/uL — ABNORMAL HIGH (ref 4.0–10.5)
nRBC: 0 % (ref 0.0–0.2)

## 2019-09-28 LAB — COMPREHENSIVE METABOLIC PANEL
ALT: 17 U/L (ref 0–44)
AST: 23 U/L (ref 15–41)
Albumin: 3.2 g/dL — ABNORMAL LOW (ref 3.5–5.0)
Alkaline Phosphatase: 76 U/L (ref 38–126)
Anion gap: 10 (ref 5–15)
BUN: 22 mg/dL (ref 8–23)
CO2: 27 mmol/L (ref 22–32)
Calcium: 8.4 mg/dL — ABNORMAL LOW (ref 8.9–10.3)
Chloride: 98 mmol/L (ref 98–111)
Creatinine, Ser: 1.19 mg/dL — ABNORMAL HIGH (ref 0.44–1.00)
GFR calc Af Amer: 49 mL/min — ABNORMAL LOW (ref >60)
GFR calc non Af Amer: 42 mL/min — ABNORMAL LOW (ref >60)
Glucose, Bld: 100 mg/dL — ABNORMAL HIGH (ref 70–99)
Potassium: 3.9 mmol/L (ref 3.5–5.1)
Sodium: 135 mmol/L (ref 135–145)
Total Bilirubin: 0.7 mg/dL (ref 0.3–1.2)
Total Protein: 7.2 g/dL (ref 6.5–8.1)

## 2019-09-28 LAB — COMP PANEL: LEUKEMIA/LYMPHOMA

## 2019-09-28 LAB — PHOSPHORUS: Phosphorus: 3.5 mg/dL (ref 2.5–4.6)

## 2019-09-28 LAB — LACTATE DEHYDROGENASE: LDH: 211 U/L — ABNORMAL HIGH (ref 98–192)

## 2019-09-28 NOTE — Progress Notes (Signed)
Hematology/Oncology follow up note [facility] [phone] [phone]   Patient Care Team: Tracie Harrier, MD as PCP - General (Internal Medicine)  REFERRING PROVIDER: Dr. Ginette Pitman CHIEF COMPLAINTS/REASON FOR VISIT:  Follow up for  history of lymphoma.  HISTORY OF PRESENTING ILLNESS:  [name] is a  86 y.o.  female with PMH listed below who was referred to me for evaluation of neck swelling, history of lymphoma. Patient reports remote history of lymphoma many years ago.  She recalls that she received chemotherapy at that time.  Followed up with Dr. Jeb Levering who retired from our [facility].  She was recently seen by PCP and has complained about her concerns of bilateral neck swelling for a while. She could not specify when she noticed this change. She reports feeling anxious as her initial symptoms which led to lymphoma diagnosis was neck LN swelling.   obtained patient's previous records in EMR by Dr.Choski. Patient has history of non hodgkin's lymphoma and s/p chemotherapy treatment. Her pathology was not available to me.   # 12/08/2017 [place] thryoid done which showed left mid thyroid nodule, 2.2 cm x 2.1cm x 2.1cm.  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 Afirma GSC benign, ROM 4%.  Patient was previously  referred to endocrinology for further management, and appears that patient never established care there.   INTERVAL HISTORY [name] is a 86 y.o. female who has above history reviewed by me today presents for follow up visit for history of lymphoma. She continues to have persistent dysphagia. 09/21/2019, patient was evaluated in emergency room for confusion.  Due to the long wait time, patient left without seeing physician in the ED.  Patient had chest x-ray and CT head without contrast along with lab work.  Chest x-ray was negative and CT was negative for acute abnormality.  UA reviewed you urinary tract  infection.  Patient presents for additional lab work and during waiting, patient was found to be confused and talk to herself.  She was approached by nursing staff and escorted to examining room for evaluation.  Patient was seen by symptom management nurse practitioner Anderson Malta.  Lab work also showed AKI with creatinine increased to 1.5. Patient was treated with supportive care with IV fluids nitrofurantoin for 7 days for UTI.  Urine culture lateral positive for E. Coli  Today patient presents for follow-up.  She was accompanied by sister. She feels much better compared to last week.  No dysuria, fever or chills.  Appetite is poor.  She continues to have dysphagia symptoms.   Neck fullness is getting worse.  No facial swelling.  Complains dysphagia.  Mental status has improved and returned to her baseline.  Review of Systems  Constitutional: Negative for chills, fever, malaise/fatigue and weight loss.  HENT: Negative for nosebleeds and sore throat.        Neck fullness  Eyes: Negative for double vision, photophobia and redness.  Respiratory: Negative for cough, shortness of breath and wheezing.   Cardiovascular: Negative for chest pain and palpitations.  Gastrointestinal: Negative for abdominal pain, blood in stool, nausea and vomiting.       Swallowing difficulty  Genitourinary: Negative for dysuria.  Musculoskeletal: Negative for back pain, myalgias and neck pain.  Skin: Negative for itching and rash.  Neurological: Negative for dizziness, tingling and tremors.  Endo/Heme/Allergies: Negative for environmental allergies. Does not bruise/bleed easily.  Psychiatric/Behavioral: Negative for depression and hallucinations.    MEDICAL HISTORY:  Past Medical History:  Diagnosis Date  . Afib ([place])   . Anxiety   . Anxiety   . Bronchitis    recent  . Cancer ([place])    Lymphoma   . Chronic kidney disease    ckd stage 3  . Congestive heart failure (CHF) ([place])   . Coronary artery disease     Status post stent placement in LAD  . Dysrhythmia   . GERD (gastroesophageal reflux disease)   . Hyperlipemia   . Hypertension   . Hypothyroidism   . Thyroid disease     SURGICAL HISTORY: Past Surgical History:  Procedure Laterality Date  . ABDOMINAL HYSTERECTOMY     complete  . CORONARY ANGIOPLASTY     stent  . CORONARY STENT PLACEMENT    . EXCISION OF ABDOMINAL WALL TUMOR Right 05/08/2018   Procedure: EXCISION OF ABDOMINAL WALL-RIGHT;  Surgeon: Herbert Pun, MD;  Location: [facility] ORS;  Service: General;  Laterality: Right;  . INCISIONAL HERNIA REPAIR N/A 05/08/2018   Procedure: LAPAROSCOPIC VS. OPEN INCISIONAL HERNIA REPAIR WITH MESH;  Surgeon: Herbert Pun, MD;  Location: [facility] ORS;  Service: General;  Laterality: N/A;  . VEIN SURGERY     Endovenous ablation of saphenous vein    SOCIAL HISTORY: Social History   Socioeconomic History  . Marital status: Single    Spouse name: Not on file  . Number of children: Not on file  . Years of education: Not on file  . Highest education level: Not on file  Occupational History  . Occupation: retired  Tobacco Use  . Smoking status: Never Smoker  . Smokeless tobacco: Never Used  Vaping Use  . Vaping Use: Never used  Substance and Sexual Activity  . Alcohol use: No  . Drug use: No  . Sexual activity: Not on file  Other Topics Concern  . Not on file  Social History Narrative   Lives at home independently. Has a cane to ambulate occasionally. Continues to drive.   Social Determinants of Health   Financial Resource Strain:   . Difficulty of Paying Living Expenses:   Food Insecurity:   . Worried About Charity fundraiser in the Last Year:   . Arboriculturist in the Last Year:   Transportation Needs:   . Film/video editor (Medical):   Marland Kitchen Lack of Transportation (Non-Medical):   Physical Activity:   . Days of Exercise per Week:   . Minutes of Exercise per Session:   Stress:   . Feeling of Stress :    Social Connections:   . Frequency of Communication with Friends and Family:   . Frequency of Social Gatherings with Friends and Family:   . Attends Religious Services:   . Active Member of Clubs or Organizations:   . Attends Archivist Meetings:   Marland Kitchen Marital Status:   Intimate Partner Violence:   . Fear of Current or Ex-Partner:   . Emotionally Abused:   Marland Kitchen Physically Abused:   . Sexually Abused:     FAMILY HISTORY: Family History  Problem Relation Age of Onset  . Heart Problems Mother   . Heart Problems Father   . Heart attack Father   . Diabetes Brother   . Diabetes Brother     ALLERGIES:  is allergic to sulfa antibiotics.  MEDICATIONS:  Current Outpatient Medications  Medication Sig Dispense Refill  . albuterol (VENTOLIN HFA) 108 (90 Base) MCG/ACT inhaler Inhale 2 puffs into the lungs every 6 (six) hours as needed for wheezing  or shortness of breath. 8 g 0  . allopurinol (ZYLOPRIM) 100 MG tablet Take 100 mg by mouth 2 (two) times daily.   1  . benzonatate (TESSALON) 200 MG capsule Take 200 mg by mouth 3 (three) times daily as needed for cough.     . chlorpheniramine-HYDROcodone (TUSSIONEX) 10-8 MG/5ML SUER TAKE 5 MLS BY MOUTH NIGHTLY AS NEEDED FOR COUGH    . DULoxetine (CYMBALTA) 20 MG capsule Take 20 mg by mouth daily.     . furosemide (LASIX) 40 MG tablet     . levothyroxine (SYNTHROID, LEVOTHROID) 50 MCG tablet Take 50 mcg by mouth daily before breakfast.    . lovastatin (MEVACOR) 40 MG tablet Take 40 mg by mouth at bedtime.     . nitrofurantoin, macrocrystal-monohydrate, (MACROBID) 100 MG capsule Take 1 capsule (100 mg total) by mouth 2 (two) times daily. 14 capsule 0  . oxybutynin (DITROPAN) 5 MG tablet Take 5 mg by mouth 3 (three) times daily.    . pantoprazole (PROTONIX) 40 MG tablet Take 40 mg by mouth daily.    . potassium chloride (K-DUR,KLOR-CON) 10 MEQ tablet Take 10 mEq by mouth daily.     Marland Kitchen rOPINIRole (REQUIP) 2 MG tablet Take 2 mg by mouth 2 (two)  times daily.    Marland Kitchen ALPRAZolam (XANAX) 0.5 MG tablet TAKE 1ST TABLET 1 HOUR PRIOR TO PROCEDURE. TAKE 2ND TABLET UPON ARRIVAL (Patient not taking: Reported on 09/09/2019)    . aspirin EC 81 MG tablet Take 81 mg by mouth daily. (Patient not taking: Reported on 09/09/2019)    . cyclobenzaprine (FLEXERIL) 5 MG tablet Take 5 mg by mouth 3 (three) times daily as needed for muscle spasms.  (Patient not taking: Reported on 09/28/2019)    . famotidine (PEPCID) 20 MG tablet Take 20 mg by mouth 2 (two) times daily.  (Patient not taking: Reported on 09/28/2019)  2  . fluticasone (FLONASE) 50 MCG/ACT nasal spray Place 1 spray into the nose daily as needed for allergies.  (Patient not taking: Reported on 09/28/2019)    . furosemide (LASIX) 40 MG tablet Take 1 tablet (40 mg total) by mouth daily. 30 tablet 1  . gabapentin (NEURONTIN) 100 MG capsule Take by mouth. (Patient not taking: Reported on 09/09/2019)    . HYDROcodone-acetaminophen (NORCO/VICODIN) 5-325 MG tablet Take 1 tablet by mouth every 6 (six) hours as needed for moderate pain.  (Patient not taking: Reported on 09/28/2019)    . magic mouthwash w/lidocaine SOLN Take 5 mLs by mouth 3 (three) times daily as needed for mouth pain. (Patient not taking: Reported on 09/09/2019)    . metroNIDAZOLE (METROCREAM) 0.75 % cream Apply to mid face once to twice daily (Patient not taking: Reported on 09/28/2019) 45 g 3  . montelukast (SINGULAIR) 10 MG tablet TAKE 1 TABLET BY MOUTH EVERY DAY AT NIGHT (Patient not taking: Reported on 09/28/2019)    . nitroGLYCERIN (NITROSTAT) 0.4 MG SL tablet Place 0.4 mg under the tongue every 5 (five) minutes x 3 doses as needed for chest pain.  (Patient not taking: Reported on 09/28/2019)    . ondansetron (ZOFRAN-ODT) 4 MG disintegrating tablet Take 4 mg by mouth every 8 (eight) hours as needed for nausea or vomiting.  (Patient not taking: Reported on 09/28/2019)  0  . predniSONE (DELTASONE) 5 MG tablet  (Patient not taking: Reported on 08/10/2019)    .  tiZANidine (ZANAFLEX) 2 MG tablet TAKE 1 TABLET (2 MG TOTAL) BY MOUTH 3 (THREE) TIMES DAILY AS  NEEDED (Patient not taking: Reported on 09/28/2019)    . traMADol (ULTRAM) 50 MG tablet Take 50 mg by mouth 2 (two) times daily as needed for moderate pain.  (Patient not taking: Reported on 09/28/2019)  3   No current facility-administered medications for this visit.     PHYSICAL EXAMINATION: ECOG PERFORMANCE STATUS: 1 - Symptomatic but completely ambulatory Vitals:   09/28/19 1329  BP: 117/70  Pulse: 80  Resp: 20  Temp: (!) 97.4 F (36.3 C)   Filed Weights   09/28/19 1329  Weight: 156 lb (70.8 kg)    Physical Exam Constitutional:      General: She is not in acute distress. HENT:     Head: Normocephalic and atraumatic.  Eyes:     General: No scleral icterus.    Pupils: Pupils are equal, round, and reactive to light.  Neck:     Comments: Palpable left neck mass  Cardiovascular:     Rate and Rhythm: Normal rate and regular rhythm.     Heart sounds: Normal heart sounds.  Pulmonary:     Effort: Pulmonary effort is normal. No respiratory distress.     Breath sounds: No wheezing.  Abdominal:     General: Bowel sounds are normal. There is no distension.     Palpations: Abdomen is soft. There is no mass.     Tenderness: There is no abdominal tenderness.  Musculoskeletal:        General: No deformity. Normal range of motion.     Cervical back: Normal range of motion and neck supple.  Skin:    General: Skin is warm and dry.     Findings: No erythema or rash.  Neurological:     Mental Status: She is alert and oriented to person, place, and time.     Cranial Nerves: No cranial nerve deficit.     Coordination: Coordination normal.  Psychiatric:        Mood and Affect: Mood normal.      LABORATORY DATA:  I have reviewed the data as listed Lab Results  Component Value Date   WBC 10.7 (H) 09/28/2019   HGB 10.9 (L) 09/28/2019   HCT 32.3 (L) 09/28/2019   MCV 90.0 09/28/2019    PLT 406 (H) 09/28/2019   Recent Labs    09/21/19 1917 09/23/19 1156 09/28/19 1305  NA 131* 133* 135  K 3.5 3.5 3.9  CL 96* 96* 98  CO2 23 25 27   GLUCOSE 89 154* 100*  BUN 26* 32* 22  CREATININE 1.49* 1.52* 1.19*  CALCIUM 8.4* 8.7* 8.4*  GFRNONAA 32* 31* 42*  GFRAA 37* 36* 49*  PROT 7.0 7.2 7.2  ALBUMIN 3.6 3.4* 3.2*  AST 26 26 23   ALT 17 17 17   ALKPHOS 60 72 76  BILITOT 1.6* 1.0 0.7   RADIOGRAPHIC STUDIES: I have personally reviewed the radiological images as listed and agreed with the findings in the report. 12/08/2017 US thyroid Left mid thyroid nodule (labeled 1) meets criteria for biopsy, as designated by the newly established ACR TI-RADS criteria, and referral for biopsy is recommended.   ASSESSMENT & PLAN:  1. Thyroid mass   2. Acute cystitis without hematuria   3. History of lymphoma    History of lymphoma.  Labs are reviewed.  Increased LDH as well as uric acid. I recommend patient to continue allopurinol 200 mg daily. Abnormal LDH and uric acid may be secondary to infection, gout flare, versus recurrence of lymphoma. I will obtain a  PET scan for further evaluation.  #Neck swelling/history of thyroid mass. Thyroid mass was previously biopsied which showed follicular neoplasm indeterminate Patient was previously referred to endocrinology but she did not establish care. Per physical examination, thyroid mass may have increased in size. I will refer her to endocrinology as well as ENT for evaluation.  She likely will need to have resection due to the dysphagia symptoms.  #AKI has improved.  Creatinine returns to her baseline.  Encourage good oral hydration. #UTI, due to E. coli.  Symptoms are improved.  Recommend patient to complete course of nitrofurantoin.   Orders Placed This Encounter  Procedures  . Ambulatory referral to ENT    Referral Priority:   Routine    Referral Type:   Consultation    Referral Reason:   Specialty Services Required    Referred  to Provider:   Margaretha Sheffield, MD    Requested Specialty:   Otolaryngology    Number of Visits Requested:   1  . Ambulatory referral to Endocrinology    Referral Priority:   Routine    Referral Type:   Consultation    Referral Reason:   Specialty Services Required    Referred to Provider:   Judi Cong, MD    Number of Visits Requested:   1    We spent sufficient time to discuss many aspect of care, questions were answered to patient's satisfaction. Follow up to be determined.  Earlie Server, MD, PhD Hematology Oncology [facility] at [facility] Pager- [phone] 09/28/2019

## 2019-09-28 NOTE — Telephone Encounter (Signed)
Left message for patient to call for MD recommendations.  

## 2019-09-28 NOTE — Telephone Encounter (Signed)
Spoke with patient and she is feeling and sounds much better than she did last week.  Advised her that Dr. Tasia Catchings would like for her to come in for evaluation and possible IVF.  Patient agrees and is expecting a call with appt details

## 2019-09-28 NOTE — Progress Notes (Signed)
Patient reports occasional pain in left ear which is the same side she is having neck swelling.  Also having difficulty swallowing.  She brings in her medications to office today for medication reconciliation.

## 2019-09-28 NOTE — Telephone Encounter (Signed)
Done.Marland KitchenMarland Kitchen PET has been sched as requested. I was unable to reach pt by phone x2.  A detailed message was left on her vmail making her aware of the scheduled 10/05/19 PET scan appt @ [facility] Location, time, no carbs for 12 hrs and water ONLY 6 hrs prior. A appt reminder letter will be mailed out also.

## 2019-09-28 NOTE — Telephone Encounter (Signed)
-----   Message from Earlie Server, MD sent at 09/27/2019  9:57 PM EDT ----- Suspect recurrence of lymphoma. Was seen by Sonia Baller recently, neck swelling, confusion.  Please reach out to pt to see how is she doing- if no improvement, recommend ER.  Recommend her to start allopurinol, rx sent to pharmacy.  Schedule PET scan ASAP- history of lymphoma, suspect recurrence.  Please schedule her to see me ASAP and possible IVF. Please order Cbc cmp, phosphate, LDH.

## 2019-10-05 ENCOUNTER — Other Ambulatory Visit: Payer: Self-pay

## 2019-10-05 ENCOUNTER — Ambulatory Visit
Admission: RE | Admit: 2019-10-05 | Discharge: 2019-10-05 | Disposition: A | Discharge status: Home / Self Care | Payer: Medicare Other | Source: Ambulatory Visit | Attending: Oncology | Admitting: Oncology

## 2019-10-05 DIAGNOSIS — Z8579 Personal history of other malignant neoplasms of lymphoid, hematopoietic and related tissues: Secondary | ICD-10-CM | POA: Insufficient documentation

## 2019-10-05 DIAGNOSIS — C73 Malignant neoplasm of thyroid gland: Secondary | ICD-10-CM | POA: Insufficient documentation

## 2019-10-05 LAB — GLUCOSE, CAPILLARY: Glucose-Capillary: 106 mg/dL — ABNORMAL HIGH (ref 70–99)

## 2019-10-05 MED ORDER — FLUDEOXYGLUCOSE F - 18 (FDG) INJECTION
8.1000 | Freq: Once | INTRAVENOUS | Status: AC | PRN
  Administered 2019-10-05: 8.51 via INTRAVENOUS

## 2019-10-08 ENCOUNTER — Other Ambulatory Visit: Payer: Self-pay | Admitting: Otolaryngology

## 2019-10-08 DIAGNOSIS — E041 Nontoxic single thyroid nodule: Secondary | ICD-10-CM

## 2019-10-13 ENCOUNTER — Telehealth: Payer: Self-pay

## 2019-10-13 ENCOUNTER — Other Ambulatory Visit: Payer: Self-pay

## 2019-10-13 ENCOUNTER — Ambulatory Visit
Admission: RE | Admit: 2019-10-13 | Discharge: 2019-10-13 | Disposition: A | Discharge status: Home / Self Care | Payer: Medicare Other | Source: Ambulatory Visit | Attending: Otolaryngology | Admitting: Otolaryngology

## 2019-10-13 DIAGNOSIS — E041 Nontoxic single thyroid nodule: Secondary | ICD-10-CM | POA: Insufficient documentation

## 2019-10-13 NOTE — Discharge Instructions (Signed)
Thyroid Needle Biopsy, Care After This sheet gives you information about how to care for yourself after your procedure. Your health care provider may also give you more specific instructions. If you have problems or questions, contact your health care provider. What can I expect after the procedure? After the procedure, it is common to have:  Soreness and tenderness that lasts for a few days.  Bruising where the needle was inserted (puncture site). Follow these instructions at home:   Take over-the-counter and prescription medicines only as told by your health care provider.  To help ease discomfort, keep your head raised (elevated) when you are lying down. When you move from lying down to sitting up, use both hands to support the back of your head and neck.  Check your puncture site every day for signs of infection. Check for: ? Redness, swelling, or pain. ? Fluid or blood. ? Warmth. ? Pus or a bad smell.  Return to your normal activities as told by your health care provider. Ask your health care provider what activities are safe for you.  Keep all follow-up visits as told by your health care provider. This is important. Contact a health care provider if:  You have redness, swelling, or pain around your puncture site.  You have fluid or blood coming from your puncture site.  Your puncture site feels warm to the touch.  You have pus or a bad smell coming from your puncture site.  You have a fever. Get help right away if:  You have severe bleeding from the puncture site.  You have difficulty swallowing.  You have swollen glands (lymph nodes) in your neck. Summary  It is common to have some bruising and soreness where the needle was inserted in your lower front neck area (puncture site).  Check your puncture site every day for signs of infection, such as redness, swelling, or pain.  Get help right away if you have severe bleeding from your puncture site. This  information is not intended to replace advice given to you by your health care provider. Make sure you discuss any questions you have with your health care provider. Document Revised: 01/24/2017 Document Reviewed: 11/25/2016 Elsevier Patient Education  2020 Elsevier Inc.  

## 2019-10-13 NOTE — Procedures (Signed)
Interventional Radiology Procedure Note  Procedure:  US guided FNA of left thyroid nodule.   Complications: None Specimen: mx FNA, with ThyroSeq  Recommendations:  - Ok to shower tomorrow - Do not submerge for 7 days - Routine care   Signed,  Dulcy Fanny. Earleen Newport, DO

## 2019-10-13 NOTE — Telephone Encounter (Signed)
[facility] endocrinology to ask about referral sent on 8/16. They said that pt is already established with Dr. Gabriel Carina so she just needed a follow up. Nate, with referrals talked to patient to set up appt and she told him that she would call office back to schedule.

## 2019-10-14 NOTE — Telephone Encounter (Signed)
Called pt's sister Inez Catalina and provided her with [facility] endocrinology number. She will call and schedule appt for Ms. Brickley to follow up with Dr. Gabriel Carina.

## 2019-10-21 ENCOUNTER — Other Ambulatory Visit: Payer: Medicare Other

## 2019-10-21 ENCOUNTER — Ambulatory Visit: Payer: Medicare Other

## 2019-10-28 ENCOUNTER — Other Ambulatory Visit: Payer: Self-pay | Admitting: Otolaryngology

## 2019-10-28 DIAGNOSIS — D440 Neoplasm of uncertain behavior of thyroid gland: Secondary | ICD-10-CM

## 2019-11-05 ENCOUNTER — Other Ambulatory Visit
Admission: RE | Admit: 2019-11-05 | Discharge: 2019-11-05 | Disposition: A | Discharge status: Home / Self Care | Payer: Medicare Other | Source: Home / Self Care | Attending: Otolaryngology | Admitting: Otolaryngology

## 2019-11-05 ENCOUNTER — Other Ambulatory Visit: Payer: Self-pay

## 2019-11-05 ENCOUNTER — Ambulatory Visit
Admission: RE | Admit: 2019-11-05 | Discharge: 2019-11-05 | Disposition: A | Discharge status: Home / Self Care | Payer: Medicare Other | Source: Ambulatory Visit | Attending: Otolaryngology | Admitting: Otolaryngology

## 2019-11-05 DIAGNOSIS — D440 Neoplasm of uncertain behavior of thyroid gland: Secondary | ICD-10-CM

## 2019-11-05 LAB — CREATININE, SERUM
Creatinine, Ser: 1.32 mg/dL — ABNORMAL HIGH (ref 0.44–1.00)
GFR calc Af Amer: 43 mL/min — ABNORMAL LOW (ref >60)
GFR calc non Af Amer: 37 mL/min — ABNORMAL LOW (ref >60)

## 2019-11-05 MED ORDER — IOHEXOL 300 MG/ML  SOLN
60.0000 mL | Freq: Once | INTRAMUSCULAR | Status: AC | PRN
  Administered 2019-11-05: 60 mL via INTRAVENOUS

## 2019-11-08 ENCOUNTER — Inpatient Hospital Stay: Admission: RE | Admit: 2019-11-08 | Payer: Medicare Other | Source: Ambulatory Visit

## 2019-11-10 ENCOUNTER — Encounter: Payer: Self-pay | Admitting: Otolaryngology

## 2019-11-10 LAB — CYTOLOGY - NON PAP

## 2019-11-12 ENCOUNTER — Other Ambulatory Visit: Payer: Self-pay

## 2019-11-12 ENCOUNTER — Other Ambulatory Visit
Admission: RE | Admit: 2019-11-12 | Discharge: 2019-11-12 | Disposition: A | Discharge status: Home / Self Care | Payer: Medicare Other | Source: Ambulatory Visit | Attending: Otolaryngology | Admitting: Otolaryngology

## 2019-11-12 NOTE — Patient Instructions (Signed)
Your procedure is scheduled on: Wednesday November 17, 2019. Report to Day Surgery inside [place] 2nd floor. To find out your arrival time please call [phone] between 1PM - 3PM on Tuesday November 16, 2019.  Remember: Instructions that are not followed completely may result in serious medical risk,  up to and including death, or upon the discretion of your surgeon and anesthesiologist your  surgery may need to be rescheduled.     _X__ 1. Do not eat food after midnight the night before your procedure.                 No chewing gum or hard candies. You may drink clear liquids up to 2 hours                 before you are scheduled to arrive for your surgery- DO not drink clear                 liquids within 2 hours of the start of your surgery.                 Clear Liquids include:  water, apple juice without pulp, clear Gatorade, G2 or                  Gatorade Zero (avoid Red/Purple/Blue), Black Coffee or Tea (Do not add                 anything to coffee or tea).  __X__2.  On the morning of surgery brush your teeth with toothpaste and water, you                may rinse your mouth with mouthwash if you wish.  Do not swallow any toothpaste of mouthwash.     _X__ 3.  No Alcohol for 24 hours before or after surgery.   _X__ 4.  Do Not Smoke or use e-cigarettes For 24 Hours Prior to Your Surgery.                 Do not use any chewable tobacco products for at least 6 hours prior to                 Surgery.  _X__  5.  Do not use any recreational drugs (marijuana, cocaine, heroin, ecstasy, MDMA or other)                For at least one week prior to your surgery.  Combination of these drugs with anesthesia                May have life threatening results.  __x__ 6.  Notify your doctor if there is any change in your medical condition      (cold, fever, infections).     Do not wear jewelry, make-up, hairpins, clips or nail polish. Do not wear lotions,  powders, or perfumes. You may wear deodorant. Do not shave 48 hours prior to surgery. Men may shave face and neck. Do not bring valuables to the hospital.    [facility] is not responsible for any belongings or valuables.  Contacts, dentures or bridgework may not be worn into surgery. Leave your suitcase in the car. After surgery it may be brought to your room. For patients admitted to the hospital, discharge time is determined by your treatment team.   Patients discharged the day of surgery will not be allowed to drive home.   Make arrangements for someone to be  with you for the first 24 hours of your Same Day Discharge.  __x__ Take these medicines the morning of surgery with A SIP OF WATER:    1. levothyroxine (SYNTHROID, LEVOTHROID) 50 MCG  2. pantoprazole (PROTONIX) 40 MG  3. DULoxetine (CYMBALTA) 20 MG  4. allopurinol (ZYLOPRIM) 100 MG  5. oxybutynin (DITROPAN) 5 MG  __x__ Use inhalers on the day of surgery  albuterol (VENTOLIN HFA) 108 (90 Base) MCG/ACT inhaler   __x__ Stop Anti-inflammatories such as Ibuprofen, Aleve, Advil, naproxen, aspirin and or BC powders.   __x__ Stop supplements until after surgery.    __x__ Do not start any herbal supplements before your procedure.    If you have any questions regarding your pre-procedure instructions,  Please call Pre-admit Testing at [phone].

## 2019-11-15 ENCOUNTER — Other Ambulatory Visit
Admission: RE | Admit: 2019-11-15 | Discharge: 2019-11-15 | Disposition: A | Discharge status: Home / Self Care | Payer: Medicare Other | Source: Ambulatory Visit | Attending: Otolaryngology | Admitting: Otolaryngology

## 2019-11-15 ENCOUNTER — Other Ambulatory Visit: Payer: Self-pay

## 2019-11-15 DIAGNOSIS — Z01812 Encounter for preprocedural laboratory examination: Secondary | ICD-10-CM | POA: Insufficient documentation

## 2019-11-15 DIAGNOSIS — Z20822 Contact with and (suspected) exposure to covid-19: Secondary | ICD-10-CM | POA: Insufficient documentation

## 2019-11-16 LAB — SARS CORONAVIRUS 2 (TAT 6-24 HRS): SARS Coronavirus 2: NEGATIVE

## 2019-11-17 ENCOUNTER — Observation Stay
Admission: RE | Admit: 2019-11-17 | Discharge: 2019-11-18 | Disposition: A | Discharge status: Home / Self Care | Payer: Medicare Other | Attending: Otolaryngology | Admitting: Otolaryngology

## 2019-11-17 ENCOUNTER — Ambulatory Visit: Payer: Medicare Other | Admitting: Certified Registered"

## 2019-11-17 ENCOUNTER — Other Ambulatory Visit: Payer: Self-pay

## 2019-11-17 ENCOUNTER — Encounter: Payer: Self-pay | Admitting: Otolaryngology

## 2019-11-17 ENCOUNTER — Encounter
Admission: RE | Disposition: A | Discharge status: Home / Self Care | Payer: Self-pay | Source: Home / Self Care | Attending: Otolaryngology

## 2019-11-17 DIAGNOSIS — K219 Gastro-esophageal reflux disease without esophagitis: Secondary | ICD-10-CM | POA: Insufficient documentation

## 2019-11-17 DIAGNOSIS — I2510 Atherosclerotic heart disease of native coronary artery without angina pectoris: Secondary | ICD-10-CM | POA: Insufficient documentation

## 2019-11-17 DIAGNOSIS — J449 Chronic obstructive pulmonary disease, unspecified: Secondary | ICD-10-CM | POA: Insufficient documentation

## 2019-11-17 DIAGNOSIS — I110 Hypertensive heart disease with heart failure: Secondary | ICD-10-CM | POA: Insufficient documentation

## 2019-11-17 DIAGNOSIS — E785 Hyperlipidemia, unspecified: Secondary | ICD-10-CM | POA: Insufficient documentation

## 2019-11-17 DIAGNOSIS — I4891 Unspecified atrial fibrillation: Secondary | ICD-10-CM | POA: Diagnosis not present

## 2019-11-17 DIAGNOSIS — Z79890 Hormone replacement therapy (postmenopausal): Secondary | ICD-10-CM | POA: Insufficient documentation

## 2019-11-17 DIAGNOSIS — E039 Hypothyroidism, unspecified: Secondary | ICD-10-CM | POA: Diagnosis not present

## 2019-11-17 DIAGNOSIS — F419 Anxiety disorder, unspecified: Secondary | ICD-10-CM | POA: Insufficient documentation

## 2019-11-17 DIAGNOSIS — I509 Heart failure, unspecified: Secondary | ICD-10-CM | POA: Insufficient documentation

## 2019-11-17 DIAGNOSIS — Z79899 Other long term (current) drug therapy: Secondary | ICD-10-CM | POA: Diagnosis not present

## 2019-11-17 DIAGNOSIS — E890 Postprocedural hypothyroidism: Secondary | ICD-10-CM

## 2019-11-17 DIAGNOSIS — F329 Major depressive disorder, single episode, unspecified: Secondary | ICD-10-CM | POA: Diagnosis not present

## 2019-11-17 DIAGNOSIS — C73 Malignant neoplasm of thyroid gland: Secondary | ICD-10-CM | POA: Diagnosis present

## 2019-11-17 DIAGNOSIS — Z9889 Other specified postprocedural states: Secondary | ICD-10-CM

## 2019-11-17 HISTORY — PX: THYROIDECTOMY: SHX17

## 2019-11-17 SURGERY — THYROIDECTOMY
Anesthesia: General | Site: Neck | Laterality: Bilateral

## 2019-11-17 MED ORDER — FLEET ENEMA 7-19 GM/118ML RE ENEM: 1.0000 | ENEMA | Freq: Once | RECTAL | Status: DC | PRN

## 2019-11-17 MED ORDER — GLYCOPYRROLATE 0.2 MG/ML IJ SOLN
INTRAMUSCULAR | Status: AC
  Filled 2019-11-17: qty 1

## 2019-11-17 MED ORDER — BACITRACIN 500 UNIT/GM EX OINT
TOPICAL_OINTMENT | CUTANEOUS | Status: DC | PRN
  Administered 2019-11-17: 1 via TOPICAL

## 2019-11-17 MED ORDER — MAGNESIUM HYDROXIDE 400 MG/5ML PO SUSP: 30.0000 mL | Freq: Every day | ORAL | Status: DC | PRN

## 2019-11-17 MED ORDER — ROPINIROLE HCL 1 MG PO TABS
2.0000 mg | ORAL_TABLET | Freq: Two times a day (BID) | ORAL | Status: DC
  Administered 2019-11-17: 2 mg via ORAL
  Filled 2019-11-17: qty 2

## 2019-11-17 MED ORDER — OXYBUTYNIN CHLORIDE 5 MG PO TABS
5.0000 mg | ORAL_TABLET | Freq: Three times a day (TID) | ORAL | Status: DC
  Administered 2019-11-17 (×2): 5 mg via ORAL
  Filled 2019-11-17 (×2): qty 1

## 2019-11-17 MED ORDER — CHLORHEXIDINE GLUCONATE 0.12 % MT SOLN
OROMUCOSAL | Status: AC
  Administered 2019-11-17: 15 mL via OROMUCOSAL
  Filled 2019-11-17: qty 15

## 2019-11-17 MED ORDER — FENTANYL CITRATE (PF) 100 MCG/2ML IJ SOLN
INTRAMUSCULAR | Status: AC
  Filled 2019-11-17: qty 2

## 2019-11-17 MED ORDER — DEXAMETHASONE SODIUM PHOSPHATE 10 MG/ML IJ SOLN
INTRAMUSCULAR | Status: AC
  Filled 2019-11-17: qty 1

## 2019-11-17 MED ORDER — LIDOCAINE HCL (PF) 2 % IJ SOLN
INTRAMUSCULAR | Status: AC
  Filled 2019-11-17: qty 5

## 2019-11-17 MED ORDER — GLYCOPYRROLATE 0.2 MG/ML IJ SOLN
INTRAMUSCULAR | Status: DC | PRN
  Administered 2019-11-17: .1 mg via INTRAVENOUS

## 2019-11-17 MED ORDER — ALLOPURINOL 100 MG PO TABS
100.0000 mg | ORAL_TABLET | Freq: Two times a day (BID) | ORAL | Status: DC
  Administered 2019-11-17: 100 mg via ORAL
  Filled 2019-11-17: qty 1

## 2019-11-17 MED ORDER — REMIFENTANIL HCL 1 MG IV SOLR
INTRAVENOUS | Status: AC
  Filled 2019-11-17: qty 1000

## 2019-11-17 MED ORDER — SUCCINYLCHOLINE CHLORIDE 20 MG/ML IJ SOLN
INTRAMUSCULAR | Status: DC | PRN
  Administered 2019-11-17: 80 mg via INTRAVENOUS

## 2019-11-17 MED ORDER — ONDANSETRON 4 MG PO TBDP: 4.0000 mg | ORAL_TABLET | Freq: Four times a day (QID) | ORAL | Status: DC | PRN

## 2019-11-17 MED ORDER — SUCCINYLCHOLINE CHLORIDE 200 MG/10ML IV SOSY
PREFILLED_SYRINGE | INTRAVENOUS | Status: AC
  Filled 2019-11-17: qty 10

## 2019-11-17 MED ORDER — FUROSEMIDE 40 MG PO TABS
40.0000 mg | ORAL_TABLET | Freq: Every day | ORAL | Status: DC
  Administered 2019-11-17: 40 mg via ORAL
  Filled 2019-11-17: qty 1

## 2019-11-17 MED ORDER — PROPOFOL 10 MG/ML IV BOLUS
INTRAVENOUS | Status: AC
  Filled 2019-11-17: qty 20

## 2019-11-17 MED ORDER — POTASSIUM CHLORIDE IN NACL 20-0.45 MEQ/L-% IV SOLN
INTRAVENOUS | Status: DC
  Filled 2019-11-17 (×3): qty 1000

## 2019-11-17 MED ORDER — CHLORHEXIDINE GLUCONATE 0.12 % MT SOLN: 15.0000 mL | Freq: Once | OROMUCOSAL | Status: AC

## 2019-11-17 MED ORDER — PANTOPRAZOLE SODIUM 40 MG PO TBEC
40.0000 mg | DELAYED_RELEASE_TABLET | Freq: Every day | ORAL | Status: DC
  Administered 2019-11-17: 40 mg via ORAL
  Filled 2019-11-17: qty 1

## 2019-11-17 MED ORDER — ONDANSETRON HCL 4 MG/2ML IJ SOLN
INTRAMUSCULAR | Status: DC | PRN
  Administered 2019-11-17: 4 mg via INTRAVENOUS

## 2019-11-17 MED ORDER — SODIUM CHLORIDE (PF) 0.9 % IJ SOLN
INTRAMUSCULAR | Status: AC
  Filled 2019-11-17: qty 20

## 2019-11-17 MED ORDER — ONDANSETRON HCL 4 MG/2ML IJ SOLN
INTRAMUSCULAR | Status: AC
  Filled 2019-11-17: qty 2

## 2019-11-17 MED ORDER — ALBUTEROL SULFATE HFA 108 (90 BASE) MCG/ACT IN AERS
2.0000 | INHALATION_SPRAY | Freq: Four times a day (QID) | RESPIRATORY_TRACT | Status: DC | PRN
  Filled 2019-11-17: qty 6.7

## 2019-11-17 MED ORDER — BISACODYL 5 MG PO TBEC: 5.0000 mg | DELAYED_RELEASE_TABLET | Freq: Every day | ORAL | Status: DC | PRN

## 2019-11-17 MED ORDER — EPHEDRINE 5 MG/ML INJ
INTRAVENOUS | Status: AC
  Filled 2019-11-17: qty 10

## 2019-11-17 MED ORDER — ORAL CARE MOUTH RINSE: 15.0000 mL | Freq: Once | OROMUCOSAL | Status: AC

## 2019-11-17 MED ORDER — EPHEDRINE SULFATE 50 MG/ML IJ SOLN
INTRAMUSCULAR | Status: DC | PRN
  Administered 2019-11-17: 5 mg via INTRAVENOUS
  Administered 2019-11-17: 10 mg via INTRAVENOUS
  Administered 2019-11-17: 5 mg via INTRAVENOUS

## 2019-11-17 MED ORDER — DEXAMETHASONE SODIUM PHOSPHATE 10 MG/ML IJ SOLN
INTRAMUSCULAR | Status: DC | PRN
  Administered 2019-11-17: 10 mg via INTRAVENOUS

## 2019-11-17 MED ORDER — FENTANYL CITRATE (PF) 100 MCG/2ML IJ SOLN: 25.0000 ug | INTRAMUSCULAR | Status: DC | PRN

## 2019-11-17 MED ORDER — MORPHINE SULFATE (PF) 4 MG/ML IV SOLN: 3.0000 mg | INTRAVENOUS | Status: DC | PRN

## 2019-11-17 MED ORDER — POTASSIUM CHLORIDE CRYS ER 10 MEQ PO TBCR
10.0000 meq | EXTENDED_RELEASE_TABLET | Freq: Every day | ORAL | Status: DC
  Administered 2019-11-17: 10 meq via ORAL
  Filled 2019-11-17: qty 1

## 2019-11-17 MED ORDER — TRAMADOL HCL 50 MG PO TABS
100.0000 mg | ORAL_TABLET | Freq: Four times a day (QID) | ORAL | Status: DC | PRN
  Administered 2019-11-17: 100 mg via ORAL

## 2019-11-17 MED ORDER — PHENYLEPHRINE HCL (PRESSORS) 10 MG/ML IV SOLN
INTRAVENOUS | Status: DC | PRN
  Administered 2019-11-17 (×2): 100 ug via INTRAVENOUS

## 2019-11-17 MED ORDER — LIDOCAINE HCL (CARDIAC) PF 100 MG/5ML IV SOSY
PREFILLED_SYRINGE | INTRAVENOUS | Status: DC | PRN
  Administered 2019-11-17: 50 mg via INTRAVENOUS

## 2019-11-17 MED ORDER — LACTATED RINGERS IV SOLN: INTRAVENOUS | Status: DC

## 2019-11-17 MED ORDER — ONDANSETRON HCL 4 MG/2ML IJ SOLN: 4.0000 mg | Freq: Once | INTRAMUSCULAR | Status: DC | PRN

## 2019-11-17 MED ORDER — DEXMEDETOMIDINE (PRECEDEX) IN NS 20 MCG/5ML (4 MCG/ML) IV SYRINGE
PREFILLED_SYRINGE | INTRAVENOUS | Status: AC
  Filled 2019-11-17: qty 5

## 2019-11-17 MED ORDER — SODIUM CHLORIDE 0.9 % IV SOLN: INTRAVENOUS | Status: DC

## 2019-11-17 MED ORDER — LIDOCAINE-EPINEPHRINE (PF) 1 %-1:200000 IJ SOLN
INTRAMUSCULAR | Status: DC | PRN
  Administered 2019-11-17: 4.5 mL

## 2019-11-17 MED ORDER — REMIFENTANIL HCL 1 MG IV SOLR
INTRAVENOUS | Status: DC | PRN
  Administered 2019-11-17: .1 ug/kg/min via INTRAVENOUS

## 2019-11-17 MED ORDER — FENTANYL CITRATE (PF) 100 MCG/2ML IJ SOLN
INTRAMUSCULAR | Status: DC | PRN
Start: 2019-11-17 — End: 2019-11-17
  Administered 2019-11-17 (×2): 25 ug via INTRAVENOUS
  Administered 2019-11-17: 50 ug via INTRAVENOUS

## 2019-11-17 MED ORDER — TRAMADOL HCL 50 MG PO TABS
ORAL_TABLET | ORAL | Status: AC
  Filled 2019-11-17: qty 2

## 2019-11-17 MED ORDER — PROPOFOL 10 MG/ML IV BOLUS
INTRAVENOUS | Status: DC | PRN
  Administered 2019-11-17: 100 mg via INTRAVENOUS
  Administered 2019-11-17 (×2): 50 mg via INTRAVENOUS

## 2019-11-17 MED ORDER — DEXMEDETOMIDINE (PRECEDEX) IN NS 20 MCG/5ML (4 MCG/ML) IV SYRINGE
PREFILLED_SYRINGE | INTRAVENOUS | Status: DC | PRN
  Administered 2019-11-17: 8 ug via INTRAVENOUS
  Administered 2019-11-17: 4 ug via INTRAVENOUS
  Administered 2019-11-17: 8 ug via INTRAVENOUS

## 2019-11-17 MED ORDER — TIZANIDINE HCL 2 MG PO TABS
2.0000 mg | ORAL_TABLET | Freq: Three times a day (TID) | ORAL | Status: DC | PRN
  Filled 2019-11-17: qty 1

## 2019-11-17 SURGICAL SUPPLY — 42 items
BASIN GRAD PLASTIC 32OZ STRL (MISCELLANEOUS) ×3 IMPLANT
BLADE SURG 15 STRL LF DISP TIS (BLADE) ×2 IMPLANT
BLADE SURG 15 STRL SS (BLADE) ×4
BULB RESERV EVAC DRAIN JP 100C (MISCELLANEOUS) IMPLANT
CANISTER SUCT 1200ML W/VALVE (MISCELLANEOUS) ×3 IMPLANT
CORD BIP STRL DISP 12FT (MISCELLANEOUS) ×3 IMPLANT
COVER WAND RF STERILE (DRAPES) ×3 IMPLANT
DRAIN JP 10F RND SILICONE (MISCELLANEOUS) ×6 IMPLANT
DRAIN TLS ROUND 10FR (DRAIN) IMPLANT
DRAPE MAG INST 16X20 L/F (DRAPES) ×3 IMPLANT
DRSG TEGADERM 2-3/8X2-3/4 SM (GAUZE/BANDAGES/DRESSINGS) ×3 IMPLANT
DRSG TEGADERM 4X4.75 (GAUZE/BANDAGES/DRESSINGS) ×3 IMPLANT
DRSG TELFA 3X8 NADH (GAUZE/BANDAGES/DRESSINGS) ×3 IMPLANT
ELECT LARYNGEAL 6/7 (MISCELLANEOUS)
ELECT LARYNGEAL 8/9 (MISCELLANEOUS) ×3
ELECT REM PT RETURN 9FT ADLT (ELECTROSURGICAL) ×3
ELECTRODE LARYNGEAL 6/7 (MISCELLANEOUS) IMPLANT
ELECTRODE LARYNGEAL 8/9 (MISCELLANEOUS) ×1 IMPLANT
ELECTRODE REM PT RTRN 9FT ADLT (ELECTROSURGICAL) ×1 IMPLANT
FORCEPS JEWEL BIP 4-3/4 STR (INSTRUMENTS) ×3 IMPLANT
GAUZE 4X4 16PLY RFD (DISPOSABLE) ×3 IMPLANT
GLOVE BIO SURGEON STRL SZ7.5 (GLOVE) ×3 IMPLANT
GLOVE PROTEXIS LATEX SZ 7.5 (GLOVE) ×3 IMPLANT
GOWN STRL REUS W/ TWL LRG LVL3 (GOWN DISPOSABLE) ×3 IMPLANT
GOWN STRL REUS W/TWL LRG LVL3 (GOWN DISPOSABLE) ×6
HEMOSTAT SURGICEL 2X3 (HEMOSTASIS) ×3 IMPLANT
HOOK STAY BLUNT/RETRACTOR 5M (MISCELLANEOUS) ×3 IMPLANT
LABEL OR SOLS (LABEL) ×3 IMPLANT
NS IRRIG 500ML POUR BTL (IV SOLUTION) ×3 IMPLANT
PACK HEAD/NECK (MISCELLANEOUS) ×3 IMPLANT
PROBE NEUROSIGN BIPOL (MISCELLANEOUS) ×1 IMPLANT
PROBE NEUROSIGN BIPOLAR (MISCELLANEOUS) ×2
SHEARS HARMONIC 9CM CVD (BLADE) ×3 IMPLANT
SPONGE KITTNER 5P (MISCELLANEOUS) ×3 IMPLANT
STRAP SAFETY 5IN WIDE (MISCELLANEOUS) ×3 IMPLANT
SUT ETHILON 6 0 9-3 1X18 BLK (SUTURE) ×3 IMPLANT
SUT PROLENE 6 0 P 1 18 (SUTURE) ×3 IMPLANT
SUT SILK 2 0 (SUTURE) ×2
SUT SILK 2-0 18XBRD TIE 12 (SUTURE) ×1 IMPLANT
SUT VIC AB 4-0 RB1 18 (SUTURE) ×3 IMPLANT
SYR BULB IRRIG 60ML STRL (SYRINGE) ×3 IMPLANT
SYSTEM CHEST DRAIN TLS 7FR (DRAIN) IMPLANT

## 2019-11-17 NOTE — H&P (Signed)
H&P has been reviewed and patient reevaluated, no changes necessary. To be downloaded later.  

## 2019-11-17 NOTE — Op Note (Signed)
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  There is fatty tissue and some small nodes in the paratracheal groove inferiorly that were removed. Right superior parathyroid found and spared.  Right inferior parathyroid not found. Left superior parathyroid appeared to be spared, but the left inferior parathyroid was likely removed in the paratracheal dissection. Both recurrent laryngeal nerves were found.  The right one stimulated well at the end of the procedure.  The left one stimulated at first but then seemed to fatigue.  The nerve was clearly identified and traced out and appeared to be intact but would not stimulate at the end of the case.  Procedure: The patient was brought to the operating room and placed in supine position.  She was given general anesthesia by oral endotracheal intubation.  A wrap tube was placed that had electrodes for continuous monitoring of the recurrent laryngeal nerves by the vocal cord muscles.  A small shoulder roll was placed in the neck was gently extended.  The neck was marked with a low incision that was relatively wide for good exposure of the tumor.  The neck was infiltrated with 4-1/2 mL of 1% Xylocaine with epi 1: 100,000 for vasoconstriction.  She was prepped and draped sterile fashion.  Incision was created through the skin and subcu.  Her platysma muscle was very thin.  The strap muscles were divided in the midline and elevated.  The right thyroid gland was addressed first.  The strap muscles were elevated over the gland and the lateral border was freed.  Dissection was carried superiorly to find the superior pole of the thyroid  gland.  Multiple vessels were removed from the superior pole and the superior pole was freed up to rotate downward.  A parathyroid gland was peeled off of the superior pole with the vessels and left intact in the bed.  As the superior pole was freed up the gland could be rotated medially.  The recurrent laryngeal nerve was found in the bed beneath the thyroid gland and traced out.  It was tracked up into the laryngeal muscles.  Berry's ligament above the nerve was cut across to free up the thyroid from the trachea.  The remaining paratracheal attachments were freed as well.  Inferiorly the inferior thyroid vessels were cut across with the harmonic.  A parathyroid was not seen but I did not look for one specifically.  There were no lymph nodes noted in the right paratracheal space.  The recurrent laryngeal nerve stimulated well and was clearly seen.  The left side was addressed with elevating the strap muscles over the thyroid gland tumor.  There was a very dense capsule here that was somewhat adherent to the muscles.  The muscles were removed from this and lateral attachments were freed up.  The superior pole was freed up and the superior vessels were cut across with the harmonic scalpel.  This allowed it to rotate over some.  The gland and tumor were rotated medially and the recurrent laryngeal nerve was found in the paratracheal groove.  The nerve looked normal and stimulated at first but as we continued  to track it out and stimulate it, it quit stimulating anymore.  We could clearly see the nerve and there was no doubt this was the recurrent laryngeal nerve.  It tracked up into the laryngeal muscles and was intact.  It was tracked down into the lower neck and was intact there as well.  The Berry's ligament was cut superior to the nerve and freed up the superior pole of the thyroid from the trachea.  Remaining paratracheal tissues were removed.  Inferiorly there is some fat and lymph nodes at the paratracheal  space.  Some of this was dissected out and removed with the inferior pole of the left thyroid gland.  The recurrent laryngeal nerve was followed to make sure it was completely separate from this area.  The entire specimen was removed which included the full thyroid gland with some of the paratracheal lymph nodes attached to the inferior pole of the left thyroid gland.  Wounds were irrigated and there is no significant bleeding on either side.  Surgicel was placed into the bed on both sides.  10 French TLS drains were placed individually and crisscrossed the midline to have one drain on each side.  These were not sutured in.  The strap muscles were loosely closed in the midline with 4-0 Vicryl's.  The platysma muscle was then closed using 4-0 Vicryl as well.  The subcu was then closed with interrupted 4-0 Vicryl sutures.  The skin edges were then closed using 6-0 Prolene in a running locking suture.  The wound was covered with little bit of bacitracin followed by Telfa and a Tegaderm dressing.  The drains were taped to the skin so that they would not pull on the wound.  The patient was awakened and taken to the recovery room in satisfactory condition.  There were no operative complications.  She tolerated the procedure well.   Dispo:   To PACU to be put in observation overnight.  Plan: We will plan to check her tonight to make sure she is doing well.  We will put her on a nectar thick liquid diet with full liquids and increase to soft diet as tolerated.  She uses tramadol for pain at home and so will use that postoperatively since I know she tolerates it well.  We will plan to discharge her tomorrow morning once the drains removed.  We will check her calcium level in the morning make sure that this is still stable.  She has been on calcium supplementation for a while  Huey Romans  11/17/2019 11:17 AM

## 2019-11-17 NOTE — Transfer of Care (Signed)
Immediate Anesthesia Transfer of Care Note  Patient: [name]  Procedure(s) Performed: TOTAL THYROIDECTOMY (Bilateral Neck)  Patient Location: PACU  Anesthesia Type:General  Level of Consciousness: awake  Airway & Oxygen Therapy: Patient Spontanous Breathing and Patient connected to face mask oxygen  Post-op Assessment: Report given to RN and Post -op Vital signs reviewed and stable  Post vital signs: Reviewed  Last Vitals:  Vitals Value Taken Time  BP    Temp    Pulse    Resp    SpO2      Last Pain:  Vitals:   11/17/19 0804  PainSc: 0-No pain         Complications: No complications documented.

## 2019-11-17 NOTE — Anesthesia Preprocedure Evaluation (Signed)
Anesthesia Evaluation  Patient identified by MRN, date of birth, ID band Patient awake    Reviewed: Allergy & Precautions, H&P , NPO status , Patient's Chart, lab work & pertinent test results, reviewed documented beta blocker date and time   History of Anesthesia Complications (+) PONV, PROLONGED EMERGENCE and history of anesthetic complications  Airway Mallampati: III  TM Distance: >3 FB Neck ROM: full    Dental  (+) Edentulous Upper, Edentulous Lower, Dental Advidsory Given   Pulmonary neg shortness of breath, pneumonia, COPD, neg recent URI,    Pulmonary exam normal        Cardiovascular Exercise Tolerance: Poor hypertension, On Medications (-) angina+ CAD, + Cardiac Stents, + Peripheral Vascular Disease and +CHF  negative cardio ROS Normal cardiovascular exam+ dysrhythmias  Rhythm:regular Rate:Normal     Neuro/Psych neg Seizures PSYCHIATRIC DISORDERS Anxiety Depression  Neuromuscular disease negative psych ROS   GI/Hepatic Neg liver ROS, GERD  Medicated,  Endo/Other  negative endocrine ROSneg diabetesHypothyroidism   Renal/GU CRFRenal diseasenegative Renal ROS  negative genitourinary   Musculoskeletal   Abdominal   Peds  Hematology negative hematology ROS (+)   Anesthesia Other Findings Past Medical History: No date: Afib ([place]) No date: Anxiety No date: Anxiety No date: Bronchitis     Comment:  recent No date: Cancer ([place])     Comment:  Lymphoma  No date: Chronic kidney disease     Comment:  ckd stage 3 No date: Congestive heart failure (CHF) ([facility]) No date: Coronary artery disease     Comment:  Status post stent placement in LAD No date: Dysrhythmia No date: GERD (gastroesophageal reflux disease) No date: Hyperlipemia No date: Hypertension No date: Hypothyroidism No date: Thyroid disease Past Surgical History: No date: ABDOMINAL HYSTERECTOMY     Comment:  complete No date: CORONARY ANGIOPLASTY      Comment:  stent No date: CORONARY STENT PLACEMENT No date: VEIN SURGERY     Comment:  Endovenous ablation of saphenous vein BMI    Body Mass Index:  28.66 kg/m     Reproductive/Obstetrics negative OB ROS                             Anesthesia Physical  Anesthesia Plan  ASA: III  Anesthesia Plan: General   Post-op Pain Management:    Induction: Intravenous  PONV Risk Score and Plan: 4 or greater and Ondansetron, Dexamethasone and Treatment may vary due to age or medical condition  Airway Management Planned: Oral ETT  Additional Equipment:   Intra-op Plan:   Post-operative Plan: Extubation in OR  Informed Consent: I have reviewed the patients History and Physical, chart, labs and discussed the procedure including the risks, benefits and alternatives for the proposed anesthesia with the patient or authorized representative who has indicated his/her understanding and acceptance.     Dental Advisory Given  Plan Discussed with: CRNA  Anesthesia Plan Comments:         Anesthesia Quick Evaluation

## 2019-11-17 NOTE — Progress Notes (Signed)
11/17/2019 8:15 PM  [name] [id number]  Post-Op Check    Temp:  [97 F (36.1 C)-98.9 F (37.2 C)] 98.1 F (36.7 C) (09/22 1545) Pulse Rate:  [85-110] 86 (09/22 1745) Resp:  [13-33] 17 (09/22 1545) BP: (109-147)/(62-77) 127/71 (09/22 1745) SpO2:  [93 %-98 %] 96 % (09/22 1545) Weight:  [66 kg] 66 kg (09/22 0804),     Intake/Output Summary (Last 24 hours) at 11/17/2019 2015 Last data filed at 11/17/2019 1502 Gross per 24 hour  Intake 1280 ml  Output --  Net 1280 ml    No results found for this or any previous visit (from the past 24 hour(s)).  SUBJECTIVE: She is having very little pain and feels pretty good.  She is happy that she is talking and her voice is pretty clear.  OBJECTIVE: Her wound is flat with no swelling or bruising.  Her dressing is dry.  Her drains are intact and have a small amount of drainage.  Her voice is relatively clear.  IMPRESSION: She had a hurtful cell tumor of her left thyroid gland. She had total thyroidectomy with left middle compartment dissection.  She is taking liquids well  PLAN: We will check tomorrow morning and remove her drains.  Will redress the wound and plan to discharge her home.  We will check her calcium level first thing in the morning to make sure that this has not dropped too low.  She will continue her calcium and vitamin D at home.  [name] 11/17/2019, 8:15 PM

## 2019-11-17 NOTE — Anesthesia Procedure Notes (Signed)
Procedure Name: Intubation Performed by: Rolla Plate, CRNA Pre-anesthesia Checklist: Patient identified, Patient being monitored, Timeout performed, Emergency Drugs available and Suction available Patient Re-evaluated:Patient Re-evaluated prior to induction Oxygen Delivery Method: Circle system utilized Preoxygenation: Pre-oxygenation with 100% oxygen Induction Type: IV induction Ventilation: Mask ventilation without difficulty Laryngoscope Size: 3 and McGraph Grade View: Grade I Tube type: Oral Tube size: 6.5 mm Number of attempts: 1 Airway Equipment and Method: Stylet and Video-laryngoscopy Placement Confirmation: ETT inserted through vocal cords under direct vision,  positive ETCO2 and breath sounds checked- equal and bilateral Secured at: 21 cm Tube secured with: Tape Dental Injury: Teeth and Oropharynx as per pre-operative assessment  Comments: NIM monitor placed with VL between vocal cords surgeon confirmed placement

## 2019-11-18 ENCOUNTER — Encounter: Payer: Self-pay | Admitting: Otolaryngology

## 2019-11-18 DIAGNOSIS — C73 Malignant neoplasm of thyroid gland: Secondary | ICD-10-CM | POA: Diagnosis not present

## 2019-11-18 LAB — CALCIUM: Calcium: 8.4 mg/dL — ABNORMAL LOW (ref 8.9–10.3)

## 2019-11-18 NOTE — Progress Notes (Signed)
Patient discharged home to self care at this time. Discharge instructions given, patient and daughter verbalized understanding. Prescription given as written. Patient left floor via volunteer transport with all personal belongings.

## 2019-11-18 NOTE — Anesthesia Postprocedure Evaluation (Signed)
Anesthesia Post Note  Patient: [name]  Procedure(s) Performed: TOTAL THYROIDECTOMY (Bilateral Neck)  Patient location during evaluation: PACU Anesthesia Type: General Level of consciousness: awake and alert Pain management: pain level controlled Vital Signs Assessment: post-procedure vital signs reviewed and stable Respiratory status: spontaneous breathing, nonlabored ventilation, respiratory function stable and patient connected to nasal cannula oxygen Cardiovascular status: blood pressure returned to baseline and stable Postop Assessment: no apparent nausea or vomiting Anesthetic complications: no   No complications documented.   Last Vitals:  Vitals:   11/17/19 2113 11/18/19 0638  BP: 114/62 117/65  Pulse: 95 75  Resp: 20 20  Temp: 37.4 C 36.7 C  SpO2: 93% 91%    Last Pain:  Vitals:   11/18/19 0740  TempSrc:   PainSc: 0-No pain                 Martha Clan

## 2019-11-18 NOTE — Progress Notes (Signed)
Patient inadvertently pulled out both JP drains. Dr. Richardson Landry notified. Instructed to keep head elevated and monitor. Ice pack can be applied for swelling as needed. Will continue to monitor.  [name]  11/18/2019 12:50 AM

## 2019-11-18 NOTE — Discharge Summary (Signed)
Physician Discharge Summary  Patient ID: [name] MRN: [id number] DOB/AGE: 1932/04/04 86 y.o.  Admit date: 11/17/2019 Discharge date: 11/18/2019  Admission Diagnoses: Hurthle cell tumor of the left thyroid gland  Discharge Diagnoses: Same Active Problems:   S/P complete thyroidectomy   Discharged Condition: good  Hospital Course: Patient had surgery yesterday including a total thyroidectomy and a left medial compartment dissection.  She has done well overall.  Her drains were pulled out accidentally when she got up to go to the bathroom in a hurry last night.  Her wound is still flat and no significant bruising or swelling.  She is not have any significant pain and is able to swallow well.  Her voice is still relatively clear.  She is not having any shortness of breath.  She is discharged this morning and will follow up in the office on Wednesday of next week.  Consults: None  Significant Diagnostic Studies: labs: Her calcium is 8.4 this morning  Treatments: surgery: She had a total thyroidectomy with a left medial compartment dissection.  Discharge Exam: Blood pressure 117/65, pulse 75, temperature 98 F (36.7 C), temperature source Oral, resp. rate 20, height 5\' 1"  (1.549 m), weight 66 kg, SpO2 91 %. Her wound is flat and the dressing is reapplied.  There is no swelling or signs of bleeding or drainage.  Her voice is clear.  Disposition: Discharge disposition: 01-Home or Self Care       Discharge Instructions    Call MD for:  hives   Complete by: As directed    Call MD for:  persistant nausea and vomiting   Complete by: As directed    Call MD for:  temperature >100.4   Complete by: As directed    Diet - low sodium heart healthy   Complete by: As directed    If the dressing is still on your incision site when you go home, remove it on the third day after your surgery date. Remove dressing if it begins to fall off, or if it is dirty or damaged before the third day.    Complete by: As directed    Increase activity slowly   Complete by: As directed         Signed: Huey Romans 11/18/2019, 7:19 AM

## 2019-11-29 LAB — SURGICAL PATHOLOGY

## 2019-11-30 ENCOUNTER — Other Ambulatory Visit: Payer: Medicare Other

## 2019-12-01 ENCOUNTER — Ambulatory Visit: Payer: Medicare Other | Admitting: Oncology

## 2019-12-01 ENCOUNTER — Ambulatory Visit: Payer: Medicare Other

## 2019-12-02 DIAGNOSIS — I700 Atherosclerosis of aorta: Secondary | ICD-10-CM | POA: Insufficient documentation

## 2019-12-10 ENCOUNTER — Inpatient Hospital Stay: Payer: Medicare Other | Admitting: Oncology

## 2019-12-10 ENCOUNTER — Inpatient Hospital Stay: Payer: Medicare Other

## 2019-12-14 ENCOUNTER — Ambulatory Visit (INDEPENDENT_AMBULATORY_CARE_PROVIDER_SITE_OTHER): Payer: Medicare Other | Admitting: Dermatology

## 2019-12-14 ENCOUNTER — Other Ambulatory Visit: Payer: Self-pay

## 2019-12-14 DIAGNOSIS — L570 Actinic keratosis: Secondary | ICD-10-CM

## 2019-12-14 DIAGNOSIS — L820 Inflamed seborrheic keratosis: Secondary | ICD-10-CM | POA: Diagnosis not present

## 2019-12-14 DIAGNOSIS — D229 Melanocytic nevi, unspecified: Secondary | ICD-10-CM

## 2019-12-14 DIAGNOSIS — Z85828 Personal history of other malignant neoplasm of skin: Secondary | ICD-10-CM | POA: Diagnosis not present

## 2019-12-14 DIAGNOSIS — L821 Other seborrheic keratosis: Secondary | ICD-10-CM | POA: Diagnosis not present

## 2019-12-14 DIAGNOSIS — L578 Other skin changes due to chronic exposure to nonionizing radiation: Secondary | ICD-10-CM

## 2019-12-14 DIAGNOSIS — L814 Other melanin hyperpigmentation: Secondary | ICD-10-CM | POA: Diagnosis not present

## 2019-12-14 DIAGNOSIS — Z1283 Encounter for screening for malignant neoplasm of skin: Secondary | ICD-10-CM | POA: Diagnosis not present

## 2019-12-14 DIAGNOSIS — D1800 Hemangioma unspecified site: Secondary | ICD-10-CM

## 2019-12-14 NOTE — Patient Instructions (Addendum)

## 2019-12-14 NOTE — Progress Notes (Signed)
Follow-Up Visit   Subjective  [name] is a 86 y.o. female who presents for the following: TBSE (No concerns today). She has a few spots on her chest and back that are irritated.  Patient here for full body skin exam and skin cancer screening. Patient has h/o BCC  on R inferior knee 11/11/17. Hx of SCC on Right lower calf leg 11/08/19- treated at urgent care.  The following portions of the chart were reviewed this encounter and updated as appropriate:      Review of Systems:  No other skin or systemic complaints except as noted in HPI or Assessment and Plan.  Objective  Well appearing patient in no apparent distress; mood and affect are within normal limits.  A full examination was performed including scalp, head, eyes, ears, nose, lips, neck, chest, axillae, abdomen, back, buttocks, bilateral upper extremities, bilateral lower extremities, hands, feet, fingers, toes, fingernails, and toenails. All findings within normal limits unless otherwise noted below.  Objective  Left Nasal Dorsum: Erythematous thin papules/macules with gritty scale.   Objective  Left Breast (3), Right Breast, Spinal Upper Back: Erythematous keratotic or waxy stuck-on papule  Objective  Right Lower Calf  - Posterior: Healing ulceration with no evidence of recurrence   Assessment & Plan  AK (actinic keratosis) Left Nasal Dorsum  Cryotherapy today Prior to procedure, discussed risks of blister formation, small wound, skin dyspigmentation, or rare scar following cryotherapy.    Destruction of lesion - Left Nasal Dorsum  Destruction method: cryotherapy   Informed consent: discussed and consent obtained   Lesion destroyed using liquid nitrogen: Yes   Region frozen until ice ball extended beyond lesion: Yes   Outcome: patient tolerated procedure well with no complications   Post-procedure details: wound care instructions given    Inflamed seborrheic keratosis (5) Left Breast (3); Right Breast;  Spinal Upper Back  Cryotherapy today Prior to procedure, discussed risks of blister formation, small wound, skin dyspigmentation, or rare scar following cryotherapy.    Destruction of lesion - Left Breast, Right Breast, Spinal Upper Back  Destruction method: cryotherapy   Informed consent: discussed and consent obtained   Lesion destroyed using liquid nitrogen: Yes   Region frozen until ice ball extended beyond lesion: Yes   Outcome: patient tolerated procedure well with no complications   Post-procedure details: wound care instructions given    History of SCC (squamous cell carcinoma) of skin Right Lower Calf  - Posterior  Clear. Observe for recurrence. Call clinic for new or changing lesions.  Recommend regular skin exams, daily broad-spectrum spf 30+ sunscreen use, and photoprotection.      Lentigines - Scattered tan macules - Discussed due to sun exposure - Benign, observe - Call for any changes  Seborrheic Keratoses - Stuck-on, waxy, tan-brown papules and plaques  - Discussed benign etiology and prognosis. - Observe - Call for any changes  Melanocytic Nevi - Tan-brown and/or pink-flesh-colored symmetric macules and papules - Benign appearing on exam today - Observation - Call clinic for new or changing moles - Recommend daily use of broad spectrum spf 30+ sunscreen to sun-exposed areas.   Hemangiomas - Red papules - Discussed benign nature - Observe - Call for any changes  Actinic Damage - diffuse scaly erythematous macules with underlying dyspigmentation - Recommend daily broad spectrum sunscreen SPF 30+ to sun-exposed areas, reapply every 2 hours as needed.  - Call for new or changing lesions.  History of Basal Cell Carcinoma of the Skin Right inferior knee -  No evidence of recurrence today - Recommend regular full body skin exams - Recommend daily broad spectrum sunscreen SPF 30+ to sun-exposed areas, reapply every 2 hours as needed.  - Call if any new  or changing lesions are noted between office visits   Skin cancer screening performed today.   Return in about 6 months (around 06/13/2020) for AK Follow up, recheck SCC on right leg.  Marene Lenz, CMA, am acting as scribe for Brendolyn Patty, MD .  Documentation: I have reviewed the above documentation for accuracy and completeness, and I agree with the above.  Brendolyn Patty MD

## 2020-01-14 ENCOUNTER — Ambulatory Visit
Admission: RE | Admit: 2020-01-14 | Discharge: 2020-01-14 | Disposition: A | Discharge status: Home / Self Care | Payer: Medicare Other | Source: Ambulatory Visit | Attending: Internal Medicine | Admitting: Internal Medicine

## 2020-01-14 ENCOUNTER — Other Ambulatory Visit: Payer: Self-pay | Admitting: Internal Medicine

## 2020-01-14 ENCOUNTER — Other Ambulatory Visit (HOSPITAL_COMMUNITY): Payer: Self-pay | Admitting: Internal Medicine

## 2020-01-14 ENCOUNTER — Other Ambulatory Visit: Payer: Self-pay

## 2020-01-14 DIAGNOSIS — R296 Repeated falls: Secondary | ICD-10-CM | POA: Diagnosis present

## 2020-01-14 DIAGNOSIS — R55 Syncope and collapse: Secondary | ICD-10-CM

## 2020-01-14 DIAGNOSIS — R42 Dizziness and giddiness: Secondary | ICD-10-CM | POA: Diagnosis not present

## 2020-02-06 ENCOUNTER — Other Ambulatory Visit: Payer: Self-pay

## 2020-02-06 ENCOUNTER — Emergency Department: Payer: Medicare Other

## 2020-02-06 ENCOUNTER — Encounter: Payer: Self-pay | Admitting: Emergency Medicine

## 2020-02-06 ENCOUNTER — Emergency Department
Admission: EM | Admit: 2020-02-06 | Discharge: 2020-02-06 | Disposition: A | Discharge status: Home / Self Care | Payer: Medicare Other | Attending: Emergency Medicine | Admitting: Emergency Medicine

## 2020-02-06 DIAGNOSIS — I509 Heart failure, unspecified: Secondary | ICD-10-CM | POA: Insufficient documentation

## 2020-02-06 DIAGNOSIS — N183 Chronic kidney disease, stage 3 unspecified: Secondary | ICD-10-CM | POA: Diagnosis not present

## 2020-02-06 DIAGNOSIS — Z79899 Other long term (current) drug therapy: Secondary | ICD-10-CM | POA: Insufficient documentation

## 2020-02-06 DIAGNOSIS — Z85828 Personal history of other malignant neoplasm of skin: Secondary | ICD-10-CM | POA: Insufficient documentation

## 2020-02-06 DIAGNOSIS — I130 Hypertensive heart and chronic kidney disease with heart failure and stage 1 through stage 4 chronic kidney disease, or unspecified chronic kidney disease: Secondary | ICD-10-CM | POA: Insufficient documentation

## 2020-02-06 DIAGNOSIS — Z9861 Coronary angioplasty status: Secondary | ICD-10-CM | POA: Diagnosis not present

## 2020-02-06 DIAGNOSIS — I2510 Atherosclerotic heart disease of native coronary artery without angina pectoris: Secondary | ICD-10-CM | POA: Insufficient documentation

## 2020-02-06 DIAGNOSIS — E039 Hypothyroidism, unspecified: Secondary | ICD-10-CM | POA: Insufficient documentation

## 2020-02-06 DIAGNOSIS — R600 Localized edema: Secondary | ICD-10-CM | POA: Diagnosis present

## 2020-02-06 LAB — COMPREHENSIVE METABOLIC PANEL
ALT: 15 U/L (ref 0–44)
AST: 22 U/L (ref 15–41)
Albumin: 3.6 g/dL (ref 3.5–5.0)
Alkaline Phosphatase: 87 U/L (ref 38–126)
Anion gap: 9 (ref 5–15)
BUN: 29 mg/dL — ABNORMAL HIGH (ref 8–23)
CO2: 26 mmol/L (ref 22–32)
Calcium: 8.8 mg/dL — ABNORMAL LOW (ref 8.9–10.3)
Chloride: 100 mmol/L (ref 98–111)
Creatinine, Ser: 1.16 mg/dL — ABNORMAL HIGH (ref 0.44–1.00)
GFR, Estimated: 46 mL/min — ABNORMAL LOW (ref >60)
Glucose, Bld: 115 mg/dL — ABNORMAL HIGH (ref 70–99)
Potassium: 3.7 mmol/L (ref 3.5–5.1)
Sodium: 135 mmol/L (ref 135–145)
Total Bilirubin: 0.6 mg/dL (ref 0.3–1.2)
Total Protein: 7.4 g/dL (ref 6.5–8.1)

## 2020-02-06 LAB — BRAIN NATRIURETIC PEPTIDE: B Natriuretic Peptide: 71.8 pg/mL (ref 0.0–100.0)

## 2020-02-06 LAB — CBC WITH DIFFERENTIAL/PLATELET
Abs Immature Granulocytes: 0.02 10*3/uL (ref 0.00–0.07)
Basophils Absolute: 0 10*3/uL (ref 0.0–0.1)
Basophils Relative: 0 %
Eosinophils Absolute: 0.4 10*3/uL (ref 0.0–0.5)
Eosinophils Relative: 5 %
HCT: 35.5 % — ABNORMAL LOW (ref 36.0–46.0)
Hemoglobin: 11.5 g/dL — ABNORMAL LOW (ref 12.0–15.0)
Immature Granulocytes: 0 %
Lymphocytes Relative: 20 %
Lymphs Abs: 1.5 10*3/uL (ref 0.7–4.0)
MCH: 30.5 pg (ref 26.0–34.0)
MCHC: 32.4 g/dL (ref 30.0–36.0)
MCV: 94.2 fL (ref 80.0–100.0)
Monocytes Absolute: 1 10*3/uL (ref 0.1–1.0)
Monocytes Relative: 13 %
Neutro Abs: 4.6 10*3/uL (ref 1.7–7.7)
Neutrophils Relative %: 62 %
Platelets: 279 10*3/uL (ref 150–400)
RBC: 3.77 MIL/uL — ABNORMAL LOW (ref 3.87–5.11)
RDW: 15.2 % (ref 11.5–15.5)
WBC: 7.4 10*3/uL (ref 4.0–10.5)
nRBC: 0 % (ref 0.0–0.2)

## 2020-02-06 NOTE — ED Triage Notes (Signed)
Pt to ED via POV stating that she has bilateral LE edema. Pt states that it started gradually. Pt brought over from [facility] to be evaluated. Pt has hx/o CHF, states that legs are painful. Pt is in NAD.

## 2020-02-06 NOTE — ED Provider Notes (Signed)
[facility] Emergency Department Provider Note  ____________________________________________   Event Date/Time   First MD Initiated Contact with Patient 02/06/20 1537     (approximate)  I have reviewed the triage vital signs and the nursing notes.   HISTORY  Chief Complaint Leg Swelling   HPI [name] is a 86 y.o. female with past medical history of A. fib, CKD, CHF, CAD, HTN, HDL, hypothyroidism, lymphoma, and anxiety who presents after being for to the ED from clinic for assessment of some bilateral lower extremity swelling.  She thinks is slightly worse on the right notes that she recently had vein surgery on her right lower leg that got infected and is currently being treated with antibiotics but patient does not recall the name of antibiotics.  She denies any recent falls or injuries.  She states the swelling is a little bit worse on the right and thinks it is gotten that way over the last couple of days although is not exactly sure when she first noticed it.  She denies any other acute symptoms including headache, earache, sore throat, fevers, chills, chest pain, shortness of breath, abdominal pain, rash or other extremity symptoms.  She Dors is a chronic evening cough as well as chronic right shoulder pain.  She is not anticoagulated.  No clear alleviating aggravating factors.  No prior similar episodes         Past Medical History:  Diagnosis Date  . Afib ([place])   . Anxiety   . Anxiety   . Basal cell carcinoma 11/11/2017   Right inferior knee. Superficial and nodular patterns.  . Bronchitis    recent  . Cancer ([place])    Lymphoma   . Chronic kidney disease    ckd stage 3  . Congestive heart failure (CHF) ([place])   . Coronary artery disease    Status post stent placement in LAD  . Dysrhythmia   . GERD (gastroesophageal reflux disease)   . Hyperlipemia   . Hypertension   . Hypothyroidism   . Thyroid disease     Patient Active Problem  List   Diagnosis Date Noted  . S/P complete thyroidectomy 11/17/2019  . Acute cystitis without hematuria 09/28/2019  . Varicose veins of leg with pain, bilateral 08/10/2019  . Swelling of limb 06/01/2019  . Leg pain 03/10/2019  . History of lymphoma 03/08/2019  . Vascular disease 11/25/2018  . Chronic obstructive pulmonary disease ([place]) 05/22/2018  . Incisional hernia 05/08/2018  . Thyroid nodule 02/04/2018  . Gastro-esophageal reflux disease without esophagitis 07/24/2017  . Left carpal tunnel syndrome 03/29/2016  . Varicose veins of leg with pain, left 01/23/2016  . Gross hematuria 03/28/2015  . Nephrolithiasis 03/28/2015  . Atrial fibrillation ([place]) 03/28/2015  . Chronic kidney disease (CKD), stage III (moderate) ([place]) 03/23/2015  . Pneumonia 02/10/2015  . Disease of thyroid gland 07/29/2014  . HLD (hyperlipidemia) 07/29/2014  . Clinical depression 07/29/2014  . Arteriosclerosis of coronary artery 07/29/2014  . A-fib ([place]) 07/29/2014  . Anxiety 07/29/2014  . Diverticulitis 07/23/2014  . Edema leg 07/08/2014  . Acute bronchitis due to infection 01/24/2014  . Lumbar canal stenosis 12/13/2013  . Neuritis or radiculitis due to rupture of lumbar intervertebral disc 12/13/2013  . Lumbar radiculitis 12/13/2013    Past Surgical History:  Procedure Laterality Date  . ABDOMINAL HYSTERECTOMY     complete  . CORONARY ANGIOPLASTY     stent  . CORONARY STENT PLACEMENT    . EXCISION OF ABDOMINAL  WALL TUMOR Right 05/08/2018   Procedure: EXCISION OF ABDOMINAL WALL-RIGHT;  Surgeon: Herbert Pun, MD;  Location: [facility] ORS;  Service: General;  Laterality: Right;  . INCISIONAL HERNIA REPAIR N/A 05/08/2018   Procedure: LAPAROSCOPIC VS. OPEN INCISIONAL HERNIA REPAIR WITH MESH;  Surgeon: Herbert Pun, MD;  Location: [facility] ORS;  Service: General;  Laterality: N/A;  . THYROIDECTOMY Bilateral 11/17/2019   Procedure: TOTAL THYROIDECTOMY;  Surgeon: Margaretha Sheffield, MD;  Location: [facility]  ORS;  Service: ENT;  Laterality: Bilateral;  . VEIN SURGERY     Endovenous ablation of saphenous vein    Prior to Admission medications   Medication Sig Start Date End Date Taking? Authorizing Provider  albuterol (VENTOLIN HFA) 108 (90 Base) MCG/ACT inhaler Inhale 2 puffs into the lungs every 6 (six) hours as needed for wheezing or shortness of breath. 03/01/19   Merlyn Lot, MD  allopurinol (ZYLOPRIM) 100 MG tablet Take 100 mg by mouth 2 (two) times daily.  09/09/17   [provider]  azelastine (ASTELIN) 0.1 % nasal spray Place 1 spray into both nostrils 2 (two) times daily as needed (allergies.).  09/18/19   [provider]  Calcium Carb-Cholecalciferol (CALCIUM + D3 PO) Take 1 tablet by mouth in the morning and at bedtime.    [provider]  DULoxetine (CYMBALTA) 20 MG capsule Take 20 mg by mouth daily.  04/14/19   [provider]  furosemide (LASIX) 40 MG tablet Take 40 mg by mouth daily at 2 PM.  06/05/18   [provider]  levothyroxine (SYNTHROID) 100 MCG tablet Take 100 mcg by mouth daily. 12/09/19   [provider]  liothyronine (CYTOMEL) 25 MCG tablet Take by mouth. 11/24/19   [provider]  lovastatin (MEVACOR) 40 MG tablet Take 40 mg by mouth daily with supper.  03/01/15   [provider]  oxybutynin (DITROPAN) 5 MG tablet Take 5 mg by mouth 3 (three) times daily. 08/14/19   [provider]  pantoprazole (PROTONIX) 40 MG tablet Take 40 mg by mouth daily. 04/14/19   [provider]  potassium chloride (K-DUR,KLOR-CON) 10 MEQ tablet Take 10 mEq by mouth daily.  05/23/14   [provider]  rOPINIRole (REQUIP) 2 MG tablet Take 2 mg by mouth 2 (two) times daily.    [provider]  tiZANidine (ZANAFLEX) 2 MG tablet Take 2 mg by mouth 3 (three) times daily as needed (spasms.).  06/25/18   [provider]  traMADol (ULTRAM) 50 MG tablet Take 50 mg by mouth 2 (two) times daily as  needed. 11/27/19   [provider]  Vitamin D3 (VITAMIN D) 25 MCG tablet Take 1,000 Units by mouth daily.    [provider]    Allergies Sulfa antibiotics  Family History  Problem Relation Age of Onset  . Heart Problems Mother   . Heart Problems Father   . Heart attack Father   . Diabetes Brother   . Diabetes Brother     Social History Social History   Tobacco Use  . Smoking status: Never Smoker  . Smokeless tobacco: Never Used  Vaping Use  . Vaping Use: Never used  Substance Use Topics  . Alcohol use: No  . Drug use: No    Review of Systems  Review of Systems  Constitutional: Negative for chills and fever.  HENT: Negative for sore throat.   Eyes: Negative for pain.  Respiratory: Negative for cough and stridor.   Cardiovascular: Negative for chest pain.  Gastrointestinal:  Negative for vomiting.  Skin: Negative for rash.  Neurological: Negative for seizures, loss of consciousness and headaches.  Psychiatric/Behavioral: Negative for suicidal ideas.  All other systems reviewed and are negative.     ____________________________________________   PHYSICAL EXAM:  VITAL SIGNS: ED Triage Vitals  Enc Vitals Group     BP 02/06/20 1432 112/60     Pulse Rate 02/06/20 1432 74     Resp 02/06/20 1432 20     Temp 02/06/20 1432 98.2 F (36.8 C)     Temp src --      SpO2 02/06/20 1432 100 %     Weight 02/06/20 1436 148 lb (67.1 kg)     Height 02/06/20 1436 5\' 1"  (1.549 m)     Head Circumference --      Peak Flow --      Pain Score 02/06/20 1436 0     Pain Loc --      Pain Edu? --      Excl. in [place]? --    Vitals:   02/06/20 1432 02/06/20 1614  BP: 112/60 128/67  Pulse: 74 70  Resp: 20 18  Temp: 98.2 F (36.8 C)   SpO2: 100% 97%   Physical Exam Vitals and nursing note reviewed.  Constitutional:      General: She is not in acute distress.    Appearance: She is well-developed and well-nourished.  HENT:     Head: Normocephalic and  atraumatic.     Left Ear: External ear normal.     Nose: Nose normal.     Mouth/Throat:     Mouth: Mucous membranes are moist.  Eyes:     Conjunctiva/sclera: Conjunctivae normal.  Cardiovascular:     Rate and Rhythm: Normal rate and regular rhythm.     Heart sounds: No murmur heard.   Pulmonary:     Effort: Pulmonary effort is normal. No respiratory distress.     Breath sounds: Normal breath sounds.  Abdominal:     Palpations: Abdomen is soft.     Tenderness: There is no abdominal tenderness.  Musculoskeletal:     Cervical back: Neck supple.     Right lower leg: Edema present.     Left lower leg: Edema present.  Skin:    General: Skin is warm and dry.     Capillary Refill: Capillary refill takes less than 2 seconds.  Neurological:     Mental Status: She is alert and oriented to person, place, and time.  Psychiatric:        Mood and Affect: Mood and affect and mood normal.     2+ bilateral DP pulses.  Sensation is intact to light touch throughout both lower extremities.  Patient has a small ulcerated wound on the posterior aspect of the right calf that has no drainage bleeding or surrounding streaking, erythema, tenderness or other skin changes.  Appears to be healing appropriately. ____________________________________________   LABS (all labs ordered are listed, but only abnormal results are displayed)  Labs Reviewed  CBC WITH DIFFERENTIAL/PLATELET - Abnormal; Notable for the following components:      Result Value   RBC 3.77 (*)    Hemoglobin 11.5 (*)    HCT 35.5 (*)    All other components within normal limits  COMPREHENSIVE METABOLIC PANEL - Abnormal; Notable for the following components:   Glucose, Bld 115 (*)    BUN 29 (*)    Creatinine, Ser 1.16 (*)    Calcium 8.8 (*)  GFR, Estimated 46 (*)    All other components within normal limits  BRAIN NATRIURETIC PEPTIDE    ____________________________________________  ____________________________________________  RADIOLOGY   Official radiology report(s): US Venous Img Lower Bilateral  Result Date: 02/06/2020 CLINICAL DATA:  Bilateral lower extremity swelling for several days. EXAM: BILATERAL LOWER EXTREMITY VENOUS DOPPLER ULTRASOUND TECHNIQUE: Gray-scale sonography with compression, as well as color and duplex ultrasound, were performed to evaluate the deep venous system(s) from the level of the common femoral vein through the popliteal and proximal calf veins. COMPARISON:  Bilateral lower extremity venous ultrasound 09/05/2017 FINDINGS: BILATERAL VENOUS Normal compressibility of the common femoral, superficial femoral, and popliteal veins, as well as the visualized calf veins. Visualized portions of profunda femoral vein and great saphenous vein unremarkable. No filling defects to suggest DVT on grayscale or color Doppler imaging. Doppler waveforms show normal direction of venous flow, normal respiratory plasticity and response to augmentation. OTHER There are mildly prominent lymph nodes in the right groin measuring up to 2.9 x 0.8 x 1.5 cm. There is retention of fatty hila and no cortical thickening. Limitations: none IMPRESSION: 1.  Negative for DVT in the bilateral lower extremities. 2. Mildly prominent right inguinal lymph nodes with normal morphology, possibly reactive. Electronically Signed   By: Audie Pinto M.D.   On: 02/06/2020 16:57    ____________________________________________   PROCEDURES  Procedure(s) performed (including Critical Care):  Procedures   ____________________________________________   INITIAL IMPRESSION / ASSESSMENT AND PLAN / ED COURSE        Patient presents with above-stated history exam for assessment of some bilateral lower extremity swelling as described above.  Patient is afebrile hemodynamically stable arrival.   CBC obtained in triage shows evidence of  mild anemia with hemoglobin of 11.5 which is at baseline as patient's hemoglobin was 11.64 months ago.  No other significant CBC derangements.  BMP shows no significant electrolyte or metabolic derangements.  Kidney function is at baseline.  BNP is 71 and not consistent with acute heart failure.   Overall it is possible patient has chronic venous insufficiency versus some edema secondary to heart failure.  She does not appear globally volume overloaded at this time is no respiratory distress and there are no indications for emergent diuresis at this time.  There is no history or exam findings suggestive to traumatic injury.  She is neurovascular intact of both lower extremities edema appears fairly symmetric and there are no findings suggest DVT on ultrasound.  Patient does have what appears to be a healing ulcer on her right posterior calf where she states she had a vein surgery done but this is no evidence on exam of acute infection and appears to clean and healing appropriately.  Given stable vital signs with otherwise reassuring exam and work-up very low suspicion for immediately life or limb threatening pathology at this time believe she is safe for discharge with continued outpatient evaluation and management.  Discharged stable condition.  Strict return precautions advised and discussed.      ____________________________________________   FINAL CLINICAL IMPRESSION(S) / ED DIAGNOSES  Final diagnoses:  Leg edema    Medications - No data to display   ED Discharge Orders    None       Note:  This document was prepared using Dragon voice recognition software and may include unintentional dictation errors.   Lucrezia Starch, MD 02/06/20 [phone]

## 2020-02-06 NOTE — ED Notes (Signed)
Pt verbalized understanding of d/c instructions at this time. Pt denied further questions. Pt assisted outside via wheelchair. Pt NAD at this time.

## 2020-02-09 ENCOUNTER — Other Ambulatory Visit: Payer: Self-pay

## 2020-02-09 ENCOUNTER — Ambulatory Visit (INDEPENDENT_AMBULATORY_CARE_PROVIDER_SITE_OTHER): Payer: Medicare Other | Admitting: Dermatology

## 2020-02-09 DIAGNOSIS — L97211 Non-pressure chronic ulcer of right calf limited to breakdown of skin: Secondary | ICD-10-CM

## 2020-02-09 DIAGNOSIS — I872 Venous insufficiency (chronic) (peripheral): Secondary | ICD-10-CM

## 2020-02-09 DIAGNOSIS — L98491 Non-pressure chronic ulcer of skin of other sites limited to breakdown of skin: Secondary | ICD-10-CM

## 2020-02-09 MED ORDER — DOXYCYCLINE MONOHYDRATE 100 MG PO TABS: 100.0000 mg | ORAL_TABLET | Freq: Two times a day (BID) | ORAL | 0 refills | Status: AC

## 2020-02-09 MED ORDER — MUPIROCIN 2 % EX OINT: TOPICAL_OINTMENT | CUTANEOUS | 1 refills | Status: DC

## 2020-02-09 NOTE — Patient Instructions (Addendum)
Compression hose - put on every morning and take off before bed. Continue daily. May apply Coban or Ace bandage over DuoDerm to prevent it coming off when applying compression hose.  Mupirocin ointment - Apply small amount to ulcer on right calf followed by DuoDerm patch. Replace every 3 days.  Doxycycline monohydrate - Take 1 pill twice daily with food x 14 days.  Doxycycline should be taken with food to prevent nausea. Do not lay down for 30 minutes after taking. Be cautious with sun exposure and use good sun protection while on this medication. Pregnant women should not take this medication.

## 2020-02-09 NOTE — Progress Notes (Signed)
   Follow-Up Visit   Subjective  [name] is a 86 y.o. female who presents for the following: Wound Check (Patient has a new ulcer at [facility] site of the right lower calf. She has burning and throbbing sensation in this area. She was prescribed doxycycline at [facility]. Has improved since starting, but took last pill yesterday.).  She also had stopped her fluid pill per Dr. recommendation, and legs swelled a lot, but they have gone down since she restarted taking it again.   The following portions of the chart were reviewed this encounter and updated as appropriate:       Review of Systems:  No other skin or systemic complaints except as noted in HPI or Assessment and Plan.  Objective  Well appearing patient in no apparent distress; mood and affect are within normal limits.  A focused examination was performed including right lower leg. Relevant physical exam findings are noted in the Assessment and Plan.  Objective  Right Lower Calf: 8.44mm ulceration with surrounding mild erythema/edema. 61mm depth, full thickness.  No evidence of recurrent SCC  Images    Objective  Right Lower Leg: 2+ pitting edema foot/ankle   Assessment & Plan  Non-pressure chronic ulcer of skin of other sites limited to breakdown of skin (HCC) Right Lower Calf  Probable dehiscience with secondary infection (improving with doxycycline) at [facility] excision site. Start mupirocin ointment Apply to ulcer followed by DuoDerm CFG, changing every 3 days.  Will send order to [facility].  Recommend compression hose daily.   Restart doxycycline monohydrate 100mg  take 1 po  BID x 14 days dsp #28 [id number]  Doxycycline should be taken with food to prevent nausea. Do not lay down for 30 minutes after taking. Be cautious with sun exposure and use good sun protection while on this medication. Pregnant women should not take this medication.    doxycycline (ADOXA) 100 MG tablet - Right Lower Calf  mupirocin ointment  (BACTROBAN) 2 % - Right Lower Calf  Venous stasis dermatitis of right lower extremity Right Lower Leg  Improving since patient restarted diuretic. Continue for now and discuss maintenance dosing with PCP.  Stasis in the legs causes chronic leg swelling, which may result in itchy or painful rashes, skin discoloration, skin texture changes, and sometimes ulceration.  Recommend daily compression hose/stockings- easiest to put on first thing in morning, remove at bedtime.  Elevate legs as much as possible. Avoid salt/sodium rich foods.   Return in about 1 month (around 03/11/2020) for Ulcer f/u.  IJamesetta Orleans, CMA, am acting as scribe for Brendolyn Patty, MD .  Documentation: I have reviewed the above documentation for accuracy and completeness, and I agree with the above.  Brendolyn Patty MD

## 2020-02-15 ENCOUNTER — Emergency Department: Payer: Medicare Other

## 2020-02-15 ENCOUNTER — Emergency Department
Admission: EM | Admit: 2020-02-15 | Discharge: 2020-02-15 | Disposition: A | Discharge status: Home / Self Care | Payer: Medicare Other | Attending: Emergency Medicine | Admitting: Emergency Medicine

## 2020-02-15 ENCOUNTER — Other Ambulatory Visit: Payer: Self-pay

## 2020-02-15 DIAGNOSIS — Z85828 Personal history of other malignant neoplasm of skin: Secondary | ICD-10-CM | POA: Insufficient documentation

## 2020-02-15 DIAGNOSIS — I509 Heart failure, unspecified: Secondary | ICD-10-CM | POA: Diagnosis not present

## 2020-02-15 DIAGNOSIS — W01198A Fall on same level from slipping, tripping and stumbling with subsequent striking against other object, initial encounter: Secondary | ICD-10-CM | POA: Insufficient documentation

## 2020-02-15 DIAGNOSIS — M25561 Pain in right knee: Secondary | ICD-10-CM | POA: Diagnosis present

## 2020-02-15 DIAGNOSIS — N183 Chronic kidney disease, stage 3 unspecified: Secondary | ICD-10-CM | POA: Diagnosis not present

## 2020-02-15 DIAGNOSIS — I130 Hypertensive heart and chronic kidney disease with heart failure and stage 1 through stage 4 chronic kidney disease, or unspecified chronic kidney disease: Secondary | ICD-10-CM | POA: Insufficient documentation

## 2020-02-15 DIAGNOSIS — S0033XA Contusion of nose, initial encounter: Secondary | ICD-10-CM | POA: Diagnosis not present

## 2020-02-15 DIAGNOSIS — R2241 Localized swelling, mass and lump, right lower limb: Secondary | ICD-10-CM | POA: Insufficient documentation

## 2020-02-15 DIAGNOSIS — Z79899 Other long term (current) drug therapy: Secondary | ICD-10-CM | POA: Diagnosis not present

## 2020-02-15 DIAGNOSIS — M2391 Unspecified internal derangement of right knee: Secondary | ICD-10-CM | POA: Insufficient documentation

## 2020-02-15 DIAGNOSIS — G319 Degenerative disease of nervous system, unspecified: Secondary | ICD-10-CM | POA: Diagnosis not present

## 2020-02-15 DIAGNOSIS — E039 Hypothyroidism, unspecified: Secondary | ICD-10-CM | POA: Diagnosis not present

## 2020-02-15 DIAGNOSIS — Z9861 Coronary angioplasty status: Secondary | ICD-10-CM | POA: Diagnosis not present

## 2020-02-15 DIAGNOSIS — S0990XA Unspecified injury of head, initial encounter: Secondary | ICD-10-CM | POA: Diagnosis not present

## 2020-02-15 DIAGNOSIS — I2510 Atherosclerotic heart disease of native coronary artery without angina pectoris: Secondary | ICD-10-CM | POA: Diagnosis not present

## 2020-02-15 LAB — TSH: TSH: 4.306 u[IU]/mL (ref 0.350–4.500)

## 2020-02-15 LAB — COMPREHENSIVE METABOLIC PANEL
ALT: 17 U/L (ref 0–44)
AST: 28 U/L (ref 15–41)
Albumin: 3.8 g/dL (ref 3.5–5.0)
Alkaline Phosphatase: 93 U/L (ref 38–126)
Anion gap: 13 (ref 5–15)
BUN: 42 mg/dL — ABNORMAL HIGH (ref 8–23)
CO2: 30 mmol/L (ref 22–32)
Calcium: 9 mg/dL (ref 8.9–10.3)
Chloride: 92 mmol/L — ABNORMAL LOW (ref 98–111)
Creatinine, Ser: 1.52 mg/dL — ABNORMAL HIGH (ref 0.44–1.00)
GFR, Estimated: 33 mL/min — ABNORMAL LOW (ref >60)
Glucose, Bld: 129 mg/dL — ABNORMAL HIGH (ref 70–99)
Potassium: 2.8 mmol/L — ABNORMAL LOW (ref 3.5–5.1)
Sodium: 135 mmol/L (ref 135–145)
Total Bilirubin: 1.1 mg/dL (ref 0.3–1.2)
Total Protein: 7.7 g/dL (ref 6.5–8.1)

## 2020-02-15 LAB — CBC WITH DIFFERENTIAL/PLATELET
Abs Immature Granulocytes: 0.04 10*3/uL (ref 0.00–0.07)
Basophils Absolute: 0.1 10*3/uL (ref 0.0–0.1)
Basophils Relative: 1 %
Eosinophils Absolute: 0.2 10*3/uL (ref 0.0–0.5)
Eosinophils Relative: 2 %
HCT: 37.7 % (ref 36.0–46.0)
Hemoglobin: 12.6 g/dL (ref 12.0–15.0)
Immature Granulocytes: 0 %
Lymphocytes Relative: 15 %
Lymphs Abs: 1.5 10*3/uL (ref 0.7–4.0)
MCH: 30.4 pg (ref 26.0–34.0)
MCHC: 33.4 g/dL (ref 30.0–36.0)
MCV: 90.8 fL (ref 80.0–100.0)
Monocytes Absolute: 1.2 10*3/uL — ABNORMAL HIGH (ref 0.1–1.0)
Monocytes Relative: 13 %
Neutro Abs: 6.6 10*3/uL (ref 1.7–7.7)
Neutrophils Relative %: 69 %
Platelets: 281 10*3/uL (ref 150–400)
RBC: 4.15 MIL/uL (ref 3.87–5.11)
RDW: 14.4 % (ref 11.5–15.5)
WBC: 9.5 10*3/uL (ref 4.0–10.5)
nRBC: 0 % (ref 0.0–0.2)

## 2020-02-15 LAB — TROPONIN I (HIGH SENSITIVITY)
Troponin I (High Sensitivity): 10 ng/L (ref <18)
Troponin I (High Sensitivity): 9 ng/L (ref <18)

## 2020-02-15 LAB — LACTIC ACID, PLASMA: Lactic Acid, Venous: 0.9 mmol/L (ref 0.5–1.9)

## 2020-02-15 MED ORDER — HYDROCODONE-ACETAMINOPHEN 5-325 MG PO TABS: 1.0000 | ORAL_TABLET | Freq: Four times a day (QID) | ORAL | 0 refills | Status: DC | PRN

## 2020-02-15 MED ORDER — MORPHINE SULFATE (PF) 4 MG/ML IV SOLN
4.0000 mg | Freq: Once | INTRAVENOUS | Status: AC
  Administered 2020-02-15: 23:00:00 4 mg via INTRAVENOUS
  Filled 2020-02-15: qty 1

## 2020-02-15 MED ORDER — HYDROCODONE-ACETAMINOPHEN 5-325 MG PO TABS
1.0000 | ORAL_TABLET | Freq: Once | ORAL | Status: AC
  Administered 2020-02-15: 1 via ORAL
  Filled 2020-02-15: qty 1

## 2020-02-15 MED ORDER — ONDANSETRON HCL 4 MG/2ML IJ SOLN
4.0000 mg | Freq: Once | INTRAMUSCULAR | Status: AC
  Administered 2020-02-15: 23:00:00 4 mg via INTRAVENOUS
  Filled 2020-02-15: qty 2

## 2020-02-15 MED ORDER — POTASSIUM CHLORIDE 20 MEQ PO PACK: 40.0000 meq | PACK | Freq: Every day | ORAL | Status: DC

## 2020-02-15 NOTE — Discharge Instructions (Signed)
Please wear the knee immobilizer while you are up.  You can take it off in bed.  Use the walker to get around.  For pain you can use your tramadol if you have any left or you can use the Vicodin 1 pill 4 times a day as needed for pain.  Do not take both together.  It may be too much.  Do not take regular Tylenol with the Vicodin as there is Tylenol in the Vicodin.  You may get too much Tylenol.  Please follow-up with Dr. Roland Rack, the orthopedic surgeon.  Give his office a call in the morning.  Let them know you hurt your knee and its painful and swollen and was seen in the ER.  They should be out of follow you up in a week or 2.  Return here for increasing pain swelling or redness.  Be careful the Vicodin can make you constipated and woozy.  Do not fall again.  Use your walker.  Please also follow-up with your primary care doctor to check on your falling.

## 2020-02-15 NOTE — ED Provider Notes (Signed)
[facility] Emergency Department Provider Note   ____________________________________________   Event Date/Time   First MD Initiated Contact with Patient 02/15/20 2009     (approximate)  I have reviewed the triage vital signs and the nursing notes.   HISTORY  Chief Complaint Knee Pain   HPI Tamara ELIZARDO is a 86 y.o. female patient fell this morning and reports her face hit the floor.  Since the fall her right knee has been swollen and painful.  Her right leg is swelling below the knee as well.  This is also since the fall.  Family reports that her legs were swelling a couple weeks ago when her fluid pills were stopped but fluid pills were restarted by family and the swelling has gone down except for the swelling in the right leg which went back up again today.  Patient is not sure why she fell.  She has a bruise on her nose she did say her face hit the floor.  She says she did not pass out.  Family reports she is a little weak in the right arm after a fall about 2 months ago.  Patient is having her thyroid medication adjusted and is a little bit weak as its not quite adjusted properly yet.         Past Medical History:  Diagnosis Date  . Afib ([place])   . Anxiety   . Anxiety   . Basal cell carcinoma 11/11/2017   Right inferior knee. Superficial and nodular patterns.  . Bronchitis    recent  . Cancer ([place])    Lymphoma   . Chronic kidney disease    ckd stage 3  . Congestive heart failure (CHF) ([place])   . Coronary artery disease    Status post stent placement in LAD  . Dysrhythmia   . GERD (gastroesophageal reflux disease)   . Hyperlipemia   . Hypertension   . Hypothyroidism   . Thyroid disease     Patient Active Problem List   Diagnosis Date Noted  . S/P complete thyroidectomy 11/17/2019  . Acute cystitis without hematuria 09/28/2019  . Varicose veins of leg with pain, bilateral 08/10/2019  . Swelling of limb 06/01/2019  . Leg pain  03/10/2019  . History of lymphoma 03/08/2019  . Vascular disease 11/25/2018  . Chronic obstructive pulmonary disease ([place]) 05/22/2018  . Incisional hernia 05/08/2018  . Thyroid nodule 02/04/2018  . Gastro-esophageal reflux disease without esophagitis 07/24/2017  . Left carpal tunnel syndrome 03/29/2016  . Varicose veins of leg with pain, left 01/23/2016  . Gross hematuria 03/28/2015  . Nephrolithiasis 03/28/2015  . Atrial fibrillation ([place]) 03/28/2015  . Chronic kidney disease (CKD), stage III (moderate) ([place]) 03/23/2015  . Pneumonia 02/10/2015  . Disease of thyroid gland 07/29/2014  . HLD (hyperlipidemia) 07/29/2014  . Clinical depression 07/29/2014  . Arteriosclerosis of coronary artery 07/29/2014  . A-fib ([place]) 07/29/2014  . Anxiety 07/29/2014  . Diverticulitis 07/23/2014  . Edema leg 07/08/2014  . Acute bronchitis due to infection 01/24/2014  . Lumbar canal stenosis 12/13/2013  . Neuritis or radiculitis due to rupture of lumbar intervertebral disc 12/13/2013  . Lumbar radiculitis 12/13/2013    Past Surgical History:  Procedure Laterality Date  . ABDOMINAL HYSTERECTOMY     complete  . CORONARY ANGIOPLASTY     stent  . CORONARY STENT PLACEMENT    . EXCISION OF ABDOMINAL WALL TUMOR Right 05/08/2018   Procedure: EXCISION OF ABDOMINAL WALL-RIGHT;  Surgeon: Herbert Pun, MD;  Location: [facility] ORS;  Service: General;  Laterality: Right;  . INCISIONAL HERNIA REPAIR N/A 05/08/2018   Procedure: LAPAROSCOPIC VS. OPEN INCISIONAL HERNIA REPAIR WITH MESH;  Surgeon: Herbert Pun, MD;  Location: [facility] ORS;  Service: General;  Laterality: N/A;  . THYROIDECTOMY Bilateral 11/17/2019   Procedure: TOTAL THYROIDECTOMY;  Surgeon: Margaretha Sheffield, MD;  Location: [facility] ORS;  Service: ENT;  Laterality: Bilateral;  . VEIN SURGERY     Endovenous ablation of saphenous vein    Prior to Admission medications   Medication Sig Start Date End Date Taking? Authorizing Provider  albuterol  (VENTOLIN HFA) 108 (90 Base) MCG/ACT inhaler Inhale 2 puffs into the lungs every 6 (six) hours as needed for wheezing or shortness of breath. 03/01/19   Merlyn Lot, MD  allopurinol (ZYLOPRIM) 100 MG tablet Take 100 mg by mouth 2 (two) times daily.  09/09/17   [provider]  azelastine (ASTELIN) 0.1 % nasal spray Place 1 spray into both nostrils 2 (two) times daily as needed (allergies.).  09/18/19   [provider]  Calcium Carb-Cholecalciferol (CALCIUM + D3 PO) Take 1 tablet by mouth in the morning and at bedtime.    [provider]  doxycycline (ADOXA) 100 MG tablet Take 1 tablet (100 mg total) by mouth 2 (two) times daily for 14 days. Take with food. 02/09/20 02/23/20  Brendolyn Patty, MD  DULoxetine (CYMBALTA) 20 MG capsule Take 20 mg by mouth daily.  04/14/19   [provider]  furosemide (LASIX) 40 MG tablet Take 40 mg by mouth daily at 2 PM.  06/05/18   [provider]  HYDROcodone-acetaminophen (NORCO/VICODIN) 5-325 MG tablet Take 1 tablet by mouth every 6 (six) hours as needed for moderate pain. 02/15/20   Nena Polio, MD  levothyroxine (SYNTHROID) 100 MCG tablet Take 100 mcg by mouth daily. 12/09/19   [provider]  liothyronine (CYTOMEL) 25 MCG tablet Take by mouth. 11/24/19   [provider]  lovastatin (MEVACOR) 40 MG tablet Take 40 mg by mouth daily with supper.  03/01/15   [provider]  mupirocin ointment (BACTROBAN) 2 % Apply to ulcer as directed followed by DuoDerm. 02/09/20   Brendolyn Patty, MD  oxybutynin (DITROPAN) 5 MG tablet Take 5 mg by mouth 3 (three) times daily. 08/14/19   [provider]  pantoprazole (PROTONIX) 40 MG tablet Take 40 mg by mouth daily. 04/14/19   [provider]  potassium chloride (K-DUR,KLOR-CON) 10 MEQ tablet Take 10 mEq by mouth daily.  05/23/14   [provider]  rOPINIRole (REQUIP) 2 MG tablet Take 2 mg by mouth 2 (two) times daily.    [provider]  tiZANidine (ZANAFLEX) 2 MG tablet Take 2 mg by mouth 3 (three) times daily as needed (spasms.).  06/25/18   [provider]  traMADol (ULTRAM) 50 MG tablet Take 50 mg by mouth 2 (two) times daily as needed. 11/27/19   [provider]  Vitamin D3 (VITAMIN D) 25 MCG tablet Take 1,000 Units by mouth daily.    [provider]    Allergies Sulfa antibiotics  Family History  Problem Relation Age of Onset  . Heart Problems Mother   . Heart Problems Father   . Heart attack Father   . Diabetes Brother   . Diabetes Brother     Social History Social History   Tobacco Use  . Smoking status: Never Smoker  . Smokeless tobacco: Never Used  Vaping Use  . Vaping Use: Never  used  Substance Use Topics  . Alcohol use: No  . Drug use: No    Review of Systems  Constitutional: No fever/chills Eyes: No visual changes. ENT: No sore throat. Cardiovascular: Denies chest pain. Respiratory: Denies shortness of breath. Gastrointestinal: No abdominal pain.  No nausea, no vomiting.  No diarrhea.  No constipation. Genitourinary: Negative for dysuria. Musculoskeletal: Negative for back pain. Skin: Negative for rash. Neurological: Negative for headaches, focal weakness  ____________________________________________   PHYSICAL EXAM:  VITAL SIGNS: ED Triage Vitals  Enc Vitals Group     BP 02/15/20 1330 101/80     Pulse Rate 02/15/20 1330 79     Resp 02/15/20 1330 18     Temp 02/15/20 1330 98 F (36.7 C)     Temp src --      SpO2 02/15/20 1330 97 %     Weight 02/15/20 1331 140 lb (63.5 kg)     Height 02/15/20 1331 5\' 1"  (1.549 m)     Head Circumference --      Peak Flow --      Pain Score 02/15/20 1331 10     Pain Loc --      Pain Edu? --    Constitutional: Alert and oriented. Well appearing and in no acute distress. Eyes: Conjunctivae are normal. PER Head: Atraumatic bruise nasal bridge. Nose: No congestion/rhinnorhea. Mouth/Throat: Mucous  membranes are moist.  Oropharynx non-erythematous. Neck: No stridor. Cardiovascular: Normal rate, regular rhythm. Grossly normal heart sounds.  Good peripheral circulation. Respiratory: Normal respiratory effort.  No retractions. Lungs CTAB. Gastrointestinal: Soft and nontender. No distention. No abdominal bruits.  Musculoskeletal: Left leg is normal not swollen or tender right leg is swollen and the knee is diffusely tender.  There is no redness or increased warmth in the knee.  Unable to check knee stability due to pain and swelling. Neurologic:  Normal speech and language. No gross focal neurologic deficits are appreciated.  Skin:  Skin is warm, dry and intact. No rash noted.   ____________________________________________   LABS (all labs ordered are listed, but only abnormal results are displayed)  Labs Reviewed  COMPREHENSIVE METABOLIC PANEL - Abnormal; Notable for the following components:      Result Value   Potassium 2.8 (*)    Chloride 92 (*)    Glucose, Bld 129 (*)    BUN 42 (*)    Creatinine, Ser 1.52 (*)    GFR, Estimated 33 (*)    All other components within normal limits  CBC WITH DIFFERENTIAL/PLATELET - Abnormal; Notable for the following components:   Monocytes Absolute 1.2 (*)    All other components within normal limits  LACTIC ACID, PLASMA  TSH  URINALYSIS, COMPLETE (UACMP) WITH MICROSCOPIC  TROPONIN I (HIGH SENSITIVITY)  TROPONIN I (HIGH SENSITIVITY)   ____________________________________________  EKG  EKG read interpreted by me shows normal sinus rhythm rate of 75 normal axis very fine artifact in the EKG leads but no obvious acute ST-T wave changes ____________________________________________  RADIOLOGY Gertha Calkin, personally viewed and evaluated these images (plain radiographs) as part of my medical decision making, as well as reviewing the written report by the radiologist.  ED MD interpretation: Chest x-ray read by radiology reviewed by me  is negative knee x-ray read by radiology reviewed by me is negative CT of the head neck read by radiology reviewed by me are negative for acute disease there is bad DJD in the C-spine Official radiology report(s): DG Chest 2 View  Result Date: 02/15/2020 CLINICAL DATA:  Fall, bilateral leg swelling. EXAM: CHEST - 2 VIEW COMPARISON:  Chest x-ray dated September 21, 2019. FINDINGS: The heart size and mediastinal contours are within normal limits. Atherosclerotic calcification of the aortic arch. Chronic, mildly coarsened interstitial markings are similar to prior studies. No focal consolidation, pleural effusion, or pneumothorax. No acute osseous abnormality. IMPRESSION: No active cardiopulmonary disease. Electronically Signed   By: Titus Dubin M.D.   On: 02/15/2020 14:37   DG Knee 2 Views Right  Result Date: 02/15/2020 CLINICAL DATA:  Right knee pain.  Possible infection. EXAM: RIGHT KNEE - 1-2 VIEW COMPARISON:  None. FINDINGS: Small knee joint effusion. Diffuse osteopenia and degenerative change. Chondrocalcinosis noted. No acute bony abnormality. No bony erosion. Peripheral vascular calcification. IMPRESSION: 1. Small knee joint effusion. 2. Diffuse osteopenia and degenerative change. Chondrocalcinosis. No acute bony abnormality. 3. Peripheral vascular disease. Electronically Signed   By: Marcello Moores  Register   On: 02/15/2020 14:37   CT Head Wo Contrast  Result Date: 02/15/2020 CLINICAL DATA:  Status post fall. EXAM: CT HEAD WITHOUT CONTRAST TECHNIQUE: Contiguous axial images were obtained from the base of the skull through the vertex without intravenous contrast. COMPARISON:  January 14, 2020 FINDINGS: Brain: There is mild cerebral atrophy with widening of the extra-axial spaces and ventricular dilatation. There are areas of decreased attenuation within the white matter tracts of the supratentorial brain, consistent with microvascular disease changes. Vascular: No hyperdense vessel or unexpected  calcification. Skull: Normal. Negative for fracture or focal lesion. Sinuses/Orbits: No acute finding. Other: None. IMPRESSION: 1. Mild cerebral atrophy and microvascular disease changes of the supratentorial brain. 2. No acute intracranial abnormality. Electronically Signed   By: Virgina Norfolk M.D.   On: 02/15/2020 21:15   CT Cervical Spine Wo Contrast  Result Date: 02/15/2020 CLINICAL DATA:  Status post fall. EXAM: CT CERVICAL SPINE WITHOUT CONTRAST TECHNIQUE: Multidetector CT imaging of the cervical spine was performed without intravenous contrast. Multiplanar CT image reconstructions were also generated. COMPARISON:  None. FINDINGS: Alignment: Approximately 1 mm anterolisthesis of the C4 vertebral body is seen on C5. Approximately 1 mm retrolisthesis of the C5 vertebral body on C6 is also present. Skull base and vertebrae: No acute fracture. No primary bone lesion or focal pathologic process. Soft tissues and spinal canal: No prevertebral fluid or swelling. No visible canal hematoma. Disc levels: Marked severity endplate sclerosis is seen at the level of C5-C6. Marked severity intervertebral disc space narrowing is also seen at this level. Bilateral moderate severity multilevel facet joint hypertrophy is noted. Upper chest: Negative. Other: None. IMPRESSION: 1. No acute cervical spine fracture. 2. Marked severity degenerative changes at the level of C5-C6. Electronically Signed   By: Virgina Norfolk M.D.   On: 02/15/2020 21:18   US Venous Img Lower Unilateral Right  Result Date: 02/15/2020 CLINICAL DATA:  Pain and swelling EXAM: RIGHT LOWER EXTREMITY VENOUS DOPPLER ULTRASOUND TECHNIQUE: Gray-scale sonography with compression, as well as color and duplex ultrasound, were performed to evaluate the deep venous system(s) from the level of the common femoral vein through the popliteal and proximal calf veins. COMPARISON:  None. FINDINGS: VENOUS Normal compressibility of the common femoral, superficial  femoral, and popliteal veins, as well as the visualized calf veins. Visualized portions of profunda femoral vein and great saphenous vein unremarkable. No filling defects to suggest DVT on grayscale or color Doppler imaging. Doppler waveforms show normal direction of venous flow, normal respiratory plasticity and response to augmentation. Limited views of the contralateral  common femoral vein are unremarkable. OTHER None. Limitations: none IMPRESSION: Negative. Electronically Signed   By: Constance Holster M.D.   On: 02/15/2020 21:05   DG Chest Portable 1 View  Result Date: 02/15/2020 CLINICAL DATA:  [place], lower extremity edema EXAM: PORTABLE CHEST 1 VIEW COMPARISON:  02/15/2020 at 1:52 p.m. FINDINGS: Single frontal view of the chest demonstrates a stable cardiac silhouette. No airspace disease, effusion, or pneumothorax. No acute bony abnormality. IMPRESSION: 1. Stable exam, no acute process. Electronically Signed   By: Randa Ngo M.D.   On: 02/15/2020 21:36    ____________________________________________   PROCEDURES  Procedure(s) performed (including Critical Care):  Procedures   ____________________________________________   INITIAL IMPRESSION / ASSESSMENT AND PLAN / ED COURSE  Patient likely has an internal derangement of the knee.  Ultrasound does not show any DVT.  There is no evidence of cellulitis and no fracture.  I will have her follow-up with orthopedics assuming she can ambulate with a walker and some pain meds.  She wants to go home if possible.              ____________________________________________   FINAL CLINICAL IMPRESSION(S) / ED DIAGNOSES  Final diagnoses:  Internal derangement of right knee     ED Discharge Orders         Ordered    HYDROcodone-acetaminophen (NORCO/VICODIN) 5-325 MG tablet  Every 6 hours PRN        02/15/20 2254          *Please note:  JOANETTE SILVERIA was evaluated in Emergency Department on 02/15/2020 for the symptoms  described in the history of present illness. She was evaluated in the context of the global COVID-19 pandemic, which necessitated consideration that the patient might be at risk for infection with the SARS-CoV-2 virus that causes COVID-19. Institutional protocols and algorithms that pertain to the evaluation of patients at risk for COVID-19 are in a state of rapid change based on information released by regulatory bodies including the CDC and federal and state organizations. These policies and algorithms were followed during the patient's care in the ED.  Some ED evaluations and interventions may be delayed as a result of limited staffing during and the pandemic.*   Note:  This document was prepared using Dragon voice recognition software and may include unintentional dictation errors.    Nena Polio, MD 02/15/20 [phone]

## 2020-02-15 NOTE — ED Notes (Signed)
EKG to EDP Malinda in person. Artifact remains despite multiple attempts to capture as pt continued to move post education.

## 2020-02-15 NOTE — ED Notes (Addendum)
Pt in from home post fall this morning. History of CHF. Pt HOH and doesn't have her hearing aides in. Pt A&Ox4. Recently taken off her "fluid pill". Pulse 2+ at R dorsalis pedis pulse. Swelling noted to R foot and ankle. Pitting edema 2+ in R foot. R foot warm. Slight swelling noted in L foot; non-pitting. Pt c/o R knee pain. Pt can move foot; attempts at bending R knee cause pt pain. Pt able to apply minimal weight to R leg when pivoting from wheelchair to stretcher. Visitor at bedside. Pt unsure what caused her to fall. Visitor reports pt had recent thyroid issues and that primary doctor changed her medications since then which has caused pt to sometimes fall asleep at random.

## 2020-02-15 NOTE — ED Notes (Signed)
Pt reports R arm soreness as well. R Radial pulse 1+, hand warm, color appropriate, pt able to move arm and hand within normal limits. Pt has bandaides in place at R knee and R hand from abrasions from the fall per pt. No bleeding currently noted.

## 2020-02-15 NOTE — ED Notes (Signed)
Pt remains off unit at imaging. Visitor remains at bedside in ED.

## 2020-02-15 NOTE — ED Notes (Signed)
Pt instructed on use of walker. Pt ambulated in hallway with assistance. Malinda MD at bedside. Pt clear for discharge.

## 2020-02-15 NOTE — ED Notes (Signed)
Pt given drink and snack with verbal okay from [place] as pt stated she gets quite sick on her stomach with pain meds. Visitor/POA signed ortho device documents in pt's place while she was eating with pt's verbal okay.

## 2020-02-15 NOTE — ED Notes (Signed)
Called lab to add-on trop to first grn tube. Notified this RN will send 2nd grn tube to run 2nd trop off of once pt back to ED. Pt currently away at imaging.

## 2020-02-15 NOTE — ED Notes (Signed)
Still attempting to capture EKG. Pt keep shifting in the stretcher while trying to capture EKG causing lots of artifact. Pt states she is moving because her "leg is jumping".

## 2020-02-15 NOTE — ED Notes (Signed)
2nd lt grn tube sent for 2nd trop.

## 2020-02-15 NOTE — ED Triage Notes (Addendum)
Pt here via [facility] from home.  Pt here with c/o R knee pain. Pt also has significant 3+ edema present to right RLE, 1+ pedal pulse. Feet red and swollen and bilateraly. Denies fevers at home. Pt has h/x CHF. Pt states her R knee is throbbing, pt clearly uncomfortable in triage.   Pt reports mechanical fall at 9am this morning, reports her "face hit the floor". Denies LOC. Denies taking any blood thinners.

## 2020-03-22 ENCOUNTER — Ambulatory Visit (INDEPENDENT_AMBULATORY_CARE_PROVIDER_SITE_OTHER): Payer: Medicare Other | Admitting: Dermatology

## 2020-03-22 ENCOUNTER — Other Ambulatory Visit: Payer: Self-pay

## 2020-03-22 DIAGNOSIS — L578 Other skin changes due to chronic exposure to nonionizing radiation: Secondary | ICD-10-CM

## 2020-03-22 DIAGNOSIS — L97211 Non-pressure chronic ulcer of right calf limited to breakdown of skin: Secondary | ICD-10-CM

## 2020-03-22 DIAGNOSIS — Z85828 Personal history of other malignant neoplasm of skin: Secondary | ICD-10-CM | POA: Diagnosis not present

## 2020-03-22 DIAGNOSIS — L98491 Non-pressure chronic ulcer of skin of other sites limited to breakdown of skin: Secondary | ICD-10-CM

## 2020-03-22 DIAGNOSIS — L820 Inflamed seborrheic keratosis: Secondary | ICD-10-CM | POA: Diagnosis not present

## 2020-03-22 NOTE — Patient Instructions (Signed)
Cryotherapy Aftercare  . Wash gently with soap and water everyday.   . Apply Vaseline and Band-Aid daily until healed.  

## 2020-03-22 NOTE — Progress Notes (Signed)
   Follow-Up Visit   Subjective  [name] is a 87 y.o. female who presents for the following: Follow-up (Ulceration of the right lower calf at [facility] site, much improved after doxycycline. She is still using mupirocin ointment. She also has a few itchy, sore spots on her left hand and arm.).   The following portions of the chart were reviewed this encounter and updated as appropriate:       Review of Systems:  No other skin or systemic complaints except as noted in HPI or Assessment and Plan.  Objective  Well appearing patient in no apparent distress; mood and affect are within normal limits.  A focused examination was performed including face, arms, right leg. Relevant physical exam findings are noted in the Assessment and Plan.  Objective  Right Lower Calf: Healed ulceration with pink scar  Objective  Left Forearm x 1, L hand dorsum x 2, L wrist x 1 (4): Erythematous keratotic or waxy stuck-on papule    Assessment & Plan  Non-pressure chronic ulcer of skin of other sites limited to breakdown of skin (HCC) Right Lower Calf  Treated SCC site- Improved and healed. Clear. Observe for recurrence. Call clinic for new or changing lesions.  Recommend regular skin exams, daily broad-spectrum spf 30+ sunscreen use, and photoprotection.     Inflamed seborrheic keratosis (4) Left Forearm x 1, L hand dorsum x 2, L wrist x 1  Destruction of lesion - Left Forearm x 1, L hand dorsum x 2, L wrist x 1  Destruction method: cryotherapy   Informed consent: discussed and consent obtained   Lesion destroyed using liquid nitrogen: Yes   Region frozen until ice ball extended beyond lesion: Yes   Outcome: patient tolerated procedure well with no complications   Post-procedure details: wound care instructions given    History of Squamous Cell Carcinoma of the Skin - No evidence of recurrence today - Recommend regular full body skin exams - Recommend daily broad spectrum sunscreen SPF 30+  to sun-exposed areas, reapply every 2 hours as needed.  - Call if any new or changing lesions are noted between office visits  Actinic Damage - chronic, secondary to cumulative UV radiation exposure/sun exposure over time - diffuse scaly erythematous macules with underlying dyspigmentation - Recommend daily broad spectrum sunscreen SPF 30+ to sun-exposed areas, reapply every 2 hours as needed.  - Call for new or changing lesions.  Return as scheduled.   IJamesetta Orleans, CMA, am acting as scribe for Brendolyn Patty, MD .  Documentation: I have reviewed the above documentation for accuracy and completeness, and I agree with the above.  Brendolyn Patty MD

## 2020-03-28 ENCOUNTER — Ambulatory Visit (INDEPENDENT_AMBULATORY_CARE_PROVIDER_SITE_OTHER): Payer: Medicare Other | Admitting: Dermatology

## 2020-03-28 ENCOUNTER — Other Ambulatory Visit: Payer: Self-pay

## 2020-03-28 DIAGNOSIS — I872 Venous insufficiency (chronic) (peripheral): Secondary | ICD-10-CM | POA: Diagnosis not present

## 2020-03-28 MED ORDER — DOXYCYCLINE HYCLATE 100 MG PO CAPS
100.0000 mg | ORAL_CAPSULE | Freq: Two times a day (BID) | ORAL | 0 refills | Status: AC
Start: 2020-03-28 — End: 2020-04-04

## 2020-03-28 MED ORDER — TRIAMCINOLONE ACETONIDE 0.1 % EX CREA: TOPICAL_CREAM | CUTANEOUS | 0 refills | Status: DC

## 2020-03-28 NOTE — Patient Instructions (Addendum)
Topical steroids (such as triamcinolone, fluocinolone, fluocinonide, mometasone, clobetasol, halobetasol, betamethasone, hydrocortisone) can cause thinning and lightening of the skin if they are used for too long in the same area. Your physician has selected the right strength medicine for your problem and area affected on the body. Please use your medication only as directed by your physician to prevent side effects.   Doxycycline should be taken with food to prevent nausea. Do not lay down for 30 minutes after taking. Be cautious with sun exposure and use good sun protection while on this medication. Pregnant women should not take this medication.

## 2020-03-28 NOTE — Progress Notes (Signed)
   Follow-Up Visit   Subjective  [name] is a 87 y.o. female who presents for the following: Follow-up (Patient concerned about ulcer site of the right calf. She had been using mupirocin 2% ointment until it ran out. She started using Neosporin, and area became red with blisters around site. She also has swelling in the right > left leg. She wears compression socks and tries to keep feet elevated when sitting.).  She decreased her water pill to once daily before this happened.  Really red and burning past few days.   The following portions of the chart were reviewed this encounter and updated as appropriate:       Review of Systems:  No other skin or systemic complaints except as noted in HPI or Assessment and Plan.  Objective  Well appearing patient in no apparent distress; mood and affect are within normal limits.  A focused examination was performed including lower legs. Relevant physical exam findings are noted in the Assessment and Plan.  Objective  Lower Legs: Erythema on lower legs with scattered pink papules R>L; 2+ pitting edema BL   Assessment & Plan  Venous stasis dermatitis of both lower extremities Lower Legs  Stasis in the legs causes chronic leg swelling, which may result in itchy or painful rashes, skin discoloration, skin texture changes, and sometimes ulceration.  Recommend daily compression hose/stockings- easiest to put on first thing in morning, remove at bedtime.  Elevate legs as much as possible. Avoid salt/sodium rich foods.   Continue compression socks daily. Keep feet elevated when sitting.  Start TMC 0.1% Cream Apply to rash on lower legs BID until improved. Avoid face, groin, axilla. Avoid SCC/ulcer site.   Start doxycycline 1 po BID with food x 1 week dsp #14 0Rf.  Increase diuretic to twice daily.  d/c Neosporin  Topical steroids (such as triamcinolone, fluocinolone, fluocinonide, mometasone, clobetasol, halobetasol, betamethasone,  hydrocortisone) can cause thinning and lightening of the skin if they are used for too long in the same area. Your physician has selected the right strength medicine for your problem and area affected on the body. Please use your medication only as directed by your physician to prevent side effects.   Doxycycline should be taken with food to prevent nausea. Do not lay down for 30 minutes after taking. Be cautious with sun exposure and use good sun protection while on this medication. Pregnant women should not take this medication.    triamcinolone (KENALOG) 0.1 % - Lower Legs  doxycycline (VIBRAMYCIN) 100 MG capsule - Lower Legs  Return as scheduled, for April 22.   IJamesetta Orleans, CMA, am acting as scribe for Brendolyn Patty, MD .  Documentation: I have reviewed the above documentation for accuracy and completeness, and I agree with the above.  Brendolyn Patty MD

## 2020-04-14 ENCOUNTER — Emergency Department
Admission: EM | Admit: 2020-04-14 | Discharge: 2020-04-15 | Disposition: A | Discharge status: Home / Self Care | Payer: Medicare Other | Attending: Emergency Medicine | Admitting: Emergency Medicine

## 2020-04-14 ENCOUNTER — Other Ambulatory Visit: Payer: Self-pay

## 2020-04-14 DIAGNOSIS — E039 Hypothyroidism, unspecified: Secondary | ICD-10-CM | POA: Diagnosis not present

## 2020-04-14 DIAGNOSIS — I129 Hypertensive chronic kidney disease with stage 1 through stage 4 chronic kidney disease, or unspecified chronic kidney disease: Secondary | ICD-10-CM | POA: Diagnosis not present

## 2020-04-14 DIAGNOSIS — I509 Heart failure, unspecified: Secondary | ICD-10-CM | POA: Diagnosis not present

## 2020-04-14 DIAGNOSIS — L509 Urticaria, unspecified: Secondary | ICD-10-CM | POA: Insufficient documentation

## 2020-04-14 DIAGNOSIS — Z85828 Personal history of other malignant neoplasm of skin: Secondary | ICD-10-CM | POA: Diagnosis not present

## 2020-04-14 DIAGNOSIS — F3340 Major depressive disorder, recurrent, in remission, unspecified: Secondary | ICD-10-CM | POA: Insufficient documentation

## 2020-04-14 DIAGNOSIS — Z79899 Other long term (current) drug therapy: Secondary | ICD-10-CM | POA: Diagnosis not present

## 2020-04-14 DIAGNOSIS — Z8572 Personal history of non-Hodgkin lymphomas: Secondary | ICD-10-CM | POA: Diagnosis not present

## 2020-04-14 DIAGNOSIS — Z955 Presence of coronary angioplasty implant and graft: Secondary | ICD-10-CM | POA: Insufficient documentation

## 2020-04-14 DIAGNOSIS — N183 Chronic kidney disease, stage 3 unspecified: Secondary | ICD-10-CM | POA: Insufficient documentation

## 2020-04-14 DIAGNOSIS — I2510 Atherosclerotic heart disease of native coronary artery without angina pectoris: Secondary | ICD-10-CM | POA: Insufficient documentation

## 2020-04-14 MED ORDER — FAMOTIDINE IN NACL 20-0.9 MG/50ML-% IV SOLN
20.0000 mg | Freq: Once | INTRAVENOUS | Status: AC
  Administered 2020-04-14: 20 mg via INTRAVENOUS
  Filled 2020-04-14: qty 50

## 2020-04-14 MED ORDER — DIPHENHYDRAMINE HCL 50 MG/ML IJ SOLN
50.0000 mg | Freq: Once | INTRAMUSCULAR | Status: AC
  Administered 2020-04-14: 50 mg via INTRAVENOUS
  Filled 2020-04-14: qty 1

## 2020-04-14 MED ORDER — LACTATED RINGERS IV BOLUS
1000.0000 mL | Freq: Once | INTRAVENOUS | Status: AC
  Administered 2020-04-14: 1000 mL via INTRAVENOUS

## 2020-04-14 NOTE — ED Triage Notes (Signed)
Pt states she has hives all over her body. Pt states they started at 8pm and that the only thing she had was a banana sandwich. PT states she has not had any medications for it. Pt states some shortness of breath and an uncomfortable feeling.  Pt states her tongue has been sore since the itching and hives started

## 2020-04-14 NOTE — ED Notes (Signed)
Pt c/o diffuse urticaria and rash since 2000.  Pt sts she ate a banana and mayonnaise sandwich for dinner at [place]. Denies hx of allergies to food. Denies new soap, detergent, or medications.  Denies treating symptoms with medication PTA.

## 2020-04-15 DIAGNOSIS — L509 Urticaria, unspecified: Secondary | ICD-10-CM | POA: Diagnosis not present

## 2020-04-15 MED ORDER — METHYLPREDNISOLONE SODIUM SUCC 125 MG IJ SOLR
125.0000 mg | Freq: Once | INTRAMUSCULAR | Status: AC
  Administered 2020-04-15: 125 mg via INTRAVENOUS
  Filled 2020-04-15: qty 2

## 2020-04-15 MED ORDER — FAMOTIDINE 40 MG PO TABS: 40.0000 mg | ORAL_TABLET | Freq: Every day | ORAL | 0 refills | Status: DC

## 2020-04-15 MED ORDER — DIPHENHYDRAMINE HCL 50 MG PO TABS: 50.0000 mg | ORAL_TABLET | Freq: Every evening | ORAL | 0 refills | Status: DC | PRN

## 2020-04-15 MED ORDER — PREDNISONE 20 MG PO TABS: 60.0000 mg | ORAL_TABLET | Freq: Every day | ORAL | 0 refills | Status: AC

## 2020-04-15 NOTE — ED Notes (Signed)
Pt sts itching improved. Generalized rash remains, but pt no longer scratching continuously.

## 2020-04-15 NOTE — ED Provider Notes (Signed)
[facility] Emergency Department Provider Note  ____________________________________________  Time seen: Approximately 2:54 AM  I have reviewed the triage vital signs and the nursing notes.   HISTORY  Chief Complaint Urticaria and Allergic Reaction   HPI [name] is a 87 y.o. female who presents for evaluation of hives.  Patient reports that her symptoms started at 8 PM after she ate a banana and mayonnaise sandwich for dinner.  Patient denies any history of allergic reactions in the past, no new medications, no new lotions, personal hygiene products, detergents.  Patient denies any history of allergic reaction to banana or [place] before.  She denies angioedema, throat closing sensation, tongue swelling, shortness of breath, nausea, vomiting, diarrhea, chest pain or shortness of breath.  She reports that the rash is pruritic and generalized.   No recent illness, no cough, congestion, sore throat, fever  Past Medical History:  Diagnosis Date  . Afib ([place])   . Anxiety   . Anxiety   . Basal cell carcinoma 11/11/2017   Right inferior knee. Superficial and nodular patterns.  . Bronchitis    recent  . Cancer ([place])    Lymphoma   . Chronic kidney disease    ckd stage 3  . Congestive heart failure (CHF) ([place])   . Coronary artery disease    Status post stent placement in LAD  . Dysrhythmia   . GERD (gastroesophageal reflux disease)   . Hyperlipemia   . Hypertension   . Hypothyroidism   . Thyroid disease     Patient Active Problem List   Diagnosis Date Noted  . S/P complete thyroidectomy 11/17/2019  . Acute cystitis without hematuria 09/28/2019  . Varicose veins of leg with pain, bilateral 08/10/2019  . Swelling of limb 06/01/2019  . Leg pain 03/10/2019  . History of lymphoma 03/08/2019  . Vascular disease 11/25/2018  . Chronic obstructive pulmonary disease ([place]) 05/22/2018  . Incisional hernia 05/08/2018  . Thyroid nodule 02/04/2018  .  Gastro-esophageal reflux disease without esophagitis 07/24/2017  . Left carpal tunnel syndrome 03/29/2016  . Varicose veins of leg with pain, left 01/23/2016  . Gross hematuria 03/28/2015  . Nephrolithiasis 03/28/2015  . Atrial fibrillation ([place]) 03/28/2015  . Chronic kidney disease (CKD), stage III (moderate) ([place]) 03/23/2015  . Pneumonia 02/10/2015  . Disease of thyroid gland 07/29/2014  . HLD (hyperlipidemia) 07/29/2014  . Clinical depression 07/29/2014  . Arteriosclerosis of coronary artery 07/29/2014  . A-fib ([place]) 07/29/2014  . Anxiety 07/29/2014  . Diverticulitis 07/23/2014  . Edema leg 07/08/2014  . Acute bronchitis due to infection 01/24/2014  . Lumbar canal stenosis 12/13/2013  . Neuritis or radiculitis due to rupture of lumbar intervertebral disc 12/13/2013  . Lumbar radiculitis 12/13/2013    Past Surgical History:  Procedure Laterality Date  . ABDOMINAL HYSTERECTOMY     complete  . CORONARY ANGIOPLASTY     stent  . CORONARY STENT PLACEMENT    . EXCISION OF ABDOMINAL WALL TUMOR Right 05/08/2018   Procedure: EXCISION OF ABDOMINAL WALL-RIGHT;  Surgeon: Herbert Pun, MD;  Location: [facility] ORS;  Service: General;  Laterality: Right;  . INCISIONAL HERNIA REPAIR N/A 05/08/2018   Procedure: LAPAROSCOPIC VS. OPEN INCISIONAL HERNIA REPAIR WITH MESH;  Surgeon: Herbert Pun, MD;  Location: [facility] ORS;  Service: General;  Laterality: N/A;  . THYROIDECTOMY Bilateral 11/17/2019   Procedure: TOTAL THYROIDECTOMY;  Surgeon: Margaretha Sheffield, MD;  Location: [facility] ORS;  Service: ENT;  Laterality: Bilateral;  . VEIN SURGERY  Endovenous ablation of saphenous vein    Prior to Admission medications   Medication Sig Start Date End Date Taking? Authorizing Provider  diphenhydrAMINE (BENADRYL) 50 MG tablet Take 1 tablet (50 mg total) by mouth at bedtime as needed for itching. 04/15/20  Yes Alfred Levins, [place], MD  famotidine (PEPCID) 40 MG tablet Take 1 tablet (40 mg total) by  mouth at bedtime for 4 days. 04/15/20 04/19/20 Yes Ila Landowski, [place], MD  predniSONE (DELTASONE) 20 MG tablet Take 3 tablets (60 mg total) by mouth daily with breakfast for 4 days. 04/15/20 04/19/20 Yes Alfred Levins, [place], MD  albuterol (VENTOLIN HFA) 108 (90 Base) MCG/ACT inhaler Inhale 2 puffs into the lungs every 6 (six) hours as needed for wheezing or shortness of breath. 03/01/19   Merlyn Lot, MD  allopurinol (ZYLOPRIM) 100 MG tablet Take 100 mg by mouth 2 (two) times daily.  09/09/17   [provider]  azelastine (ASTELIN) 0.1 % nasal spray Place 1 spray into both nostrils 2 (two) times daily as needed (allergies.).  09/18/19   [provider]  Calcium Carb-Cholecalciferol (CALCIUM + D3 PO) Take 1 tablet by mouth in the morning and at bedtime.    [provider]  DULoxetine (CYMBALTA) 20 MG capsule Take 20 mg by mouth daily.  04/14/19   [provider]  furosemide (LASIX) 40 MG tablet Take 40 mg by mouth daily at 2 PM.  06/05/18   [provider]  HYDROcodone-acetaminophen (NORCO/VICODIN) 5-325 MG tablet Take 1 tablet by mouth every 6 (six) hours as needed for moderate pain. 02/15/20   Nena Polio, MD  levothyroxine (SYNTHROID) 100 MCG tablet Take 100 mcg by mouth daily. 12/09/19   [provider]  liothyronine (CYTOMEL) 25 MCG tablet Take by mouth. 11/24/19   [provider]  lovastatin (MEVACOR) 40 MG tablet Take 40 mg by mouth daily with supper.  03/01/15   [provider]  oxybutynin (DITROPAN) 5 MG tablet Take 5 mg by mouth 3 (three) times daily. 08/14/19   [provider]  pantoprazole (PROTONIX) 40 MG tablet Take 40 mg by mouth daily. 04/14/19   [provider]  potassium chloride (K-DUR,KLOR-CON) 10 MEQ tablet Take 10 mEq by mouth daily.  05/23/14   [provider]  rOPINIRole (REQUIP) 2 MG tablet Take 2 mg by mouth 2 (two) times daily.    [provider]  tiZANidine (ZANAFLEX) 2 MG  tablet Take 2 mg by mouth 3 (three) times daily as needed (spasms.).  06/25/18   [provider]  traMADol (ULTRAM) 50 MG tablet Take 50 mg by mouth 2 (two) times daily as needed. 11/27/19   [provider]  triamcinolone (KENALOG) 0.1 % Apply to rash on lower legs twice a day until improved. Avoid face, groin, underarms. 03/28/20   Brendolyn Patty, MD  Vitamin D3 (VITAMIN D) 25 MCG tablet Take 1,000 Units by mouth daily.    [provider]    Allergies Sulfa antibiotics  Family History  Problem Relation Age of Onset  . Heart Problems Mother   . Heart Problems Father   . Heart attack Father   . Diabetes Brother   . Diabetes Brother     Social History Social History   Tobacco Use  . Smoking status: Never Smoker  . Smokeless tobacco: Never Used  Vaping Use  . Vaping Use: Never used  Substance Use Topics  . Alcohol use: No  . Drug use: No    Review of Systems  Constitutional: Negative for fever. Eyes: Negative for visual changes. ENT: Negative for sore throat. Neck: No neck pain  Cardiovascular: Negative for chest pain. Respiratory: Negative for shortness of breath. Gastrointestinal: Negative for abdominal pain, vomiting or diarrhea. Genitourinary: Negative for dysuria. Musculoskeletal: Negative for back pain. Skin: + hives Neurological: Negative for headaches, weakness or numbness. Psych: No SI or HI  ____________________________________________   PHYSICAL EXAM:  VITAL SIGNS: Vitals:   04/15/20 0100 04/15/20 0230  BP:  (!) 170/81  Pulse: 62 99  Resp:  20  Temp:    SpO2: 94% 95%    Constitutional: Alert and oriented. Well appearing and in no apparent distress. HEENT:      Head: Normocephalic and atraumatic.         Eyes: Conjunctivae are normal. Sclera is non-icteric.       Mouth/Throat: Mucous membranes are moist.  No angioedema, tongue and uvula are not swollen, handling her saliva with no difficulty.      Neck: Supple with no  signs of meningismus. Cardiovascular: Regular rate and rhythm. No murmurs, gallops, or rubs.  Respiratory: Normal respiratory effort. Lungs are clear to auscultation bilaterally.  No stridor Gastrointestinal: Soft, non tender. Musculoskeletal: No edema, cyanosis, or erythema of extremities. Neurologic: Normal speech and language. Face is symmetric. Moving all extremities. No gross focal neurologic deficits are appreciated. Skin: Skin is warm, dry and intact.  Diffuse hives Psychiatric: Mood and affect are normal. Speech and behavior are normal.  ____________________________________________   LABS (all labs ordered are listed, but only abnormal results are displayed)  Labs Reviewed - No data to display ____________________________________________  EKG  none  ____________________________________________  RADIOLOGY  none  ____________________________________________   PROCEDURES  Procedure(s) performed: yes .1-3 Lead EKG Interpretation Performed by: Rudene Re, MD Authorized by: Rudene Re, MD     Interpretation: non-specific     ECG rate assessment: normal     Rhythm: sinus rhythm     Ectopy: none     Conduction: normal     Critical Care performed:  None ____________________________________________   INITIAL IMPRESSION / ASSESSMENT AND PLAN / ED COURSE  87 y.o. female who presents for evaluation of hives.  No signs of anaphylaxis.  Unknown allergen.  No signs of a viral illness.  Patient received IV Benadryl, Pepcid and Solu-Medrol with resolution of her symptoms.  She feels markedly improved.  Will discharge home with a prescription for all 3 medications.  Recommended follow-up with primary care doctor for allergy testing.  Discussed my standard return precautions.  Old medical records reviewed.      _____________________________________________ Please note:  Patient was evaluated in Emergency Department today for the symptoms described in the  history of present illness. Patient was evaluated in the context of the global COVID-19 pandemic, which necessitated consideration that the patient might be at risk for infection with the SARS-CoV-2 virus that causes COVID-19. Institutional protocols and algorithms that pertain to the evaluation of patients at risk for COVID-19 are in a state of rapid change based on information released by regulatory bodies including the CDC and federal and state organizations. These policies and algorithms were followed during the patient's care in the ED.  Some ED evaluations and interventions may be delayed as a result of limited staffing during the pandemic.   [place] Controlled Substance Database was reviewed by me. ____________________________________________   FINAL CLINICAL IMPRESSION(S) / ED DIAGNOSES   Final diagnoses:  Hives      NEW MEDICATIONS STARTED DURING THIS VISIT:  ED Discharge Orders         Ordered    diphenhydrAMINE (BENADRYL) 50 MG tablet  At bedtime PRN        04/15/20 0253    famotidine (PEPCID) 40 MG tablet  Daily at bedtime        04/15/20 0253    predniSONE (DELTASONE) 20 MG tablet  Daily with breakfast        04/15/20 0253           Note:  This document was prepared using Dragon voice recognition software and may include unintentional dictation errors.    Alfred Levins, [place], MD 04/15/20 [phone]

## 2020-04-28 ENCOUNTER — Inpatient Hospital Stay: Payer: Medicare Other

## 2020-04-28 ENCOUNTER — Encounter: Payer: Self-pay | Admitting: Emergency Medicine

## 2020-04-28 ENCOUNTER — Inpatient Hospital Stay
Admission: EM | Admit: 2020-04-28 | Discharge: 2020-05-01 | DRG: 641 | Disposition: A | Discharge status: Home Health | Payer: Medicare Other | Attending: Internal Medicine | Admitting: Internal Medicine

## 2020-04-28 ENCOUNTER — Other Ambulatory Visit: Payer: Self-pay

## 2020-04-28 ENCOUNTER — Emergency Department: Payer: Medicare Other

## 2020-04-28 DIAGNOSIS — I509 Heart failure, unspecified: Secondary | ICD-10-CM

## 2020-04-28 DIAGNOSIS — I351 Nonrheumatic aortic (valve) insufficiency: Secondary | ICD-10-CM | POA: Diagnosis not present

## 2020-04-28 DIAGNOSIS — R4182 Altered mental status, unspecified: Secondary | ICD-10-CM | POA: Diagnosis not present

## 2020-04-28 DIAGNOSIS — E785 Hyperlipidemia, unspecified: Secondary | ICD-10-CM | POA: Diagnosis present

## 2020-04-28 DIAGNOSIS — Z79899 Other long term (current) drug therapy: Secondary | ICD-10-CM | POA: Diagnosis not present

## 2020-04-28 DIAGNOSIS — Z9049 Acquired absence of other specified parts of digestive tract: Secondary | ICD-10-CM | POA: Diagnosis not present

## 2020-04-28 DIAGNOSIS — Z8572 Personal history of non-Hodgkin lymphomas: Secondary | ICD-10-CM | POA: Diagnosis not present

## 2020-04-28 DIAGNOSIS — J449 Chronic obstructive pulmonary disease, unspecified: Secondary | ICD-10-CM | POA: Diagnosis present

## 2020-04-28 DIAGNOSIS — I10 Essential (primary) hypertension: Secondary | ICD-10-CM | POA: Diagnosis not present

## 2020-04-28 DIAGNOSIS — Z85828 Personal history of other malignant neoplasm of skin: Secondary | ICD-10-CM

## 2020-04-28 DIAGNOSIS — F419 Anxiety disorder, unspecified: Secondary | ICD-10-CM | POA: Diagnosis present

## 2020-04-28 DIAGNOSIS — Z955 Presence of coronary angioplasty implant and graft: Secondary | ICD-10-CM | POA: Diagnosis not present

## 2020-04-28 DIAGNOSIS — I361 Nonrheumatic tricuspid (valve) insufficiency: Secondary | ICD-10-CM | POA: Diagnosis not present

## 2020-04-28 DIAGNOSIS — E860 Dehydration: Principal | ICD-10-CM | POA: Diagnosis present

## 2020-04-28 DIAGNOSIS — Z8249 Family history of ischemic heart disease and other diseases of the circulatory system: Secondary | ICD-10-CM

## 2020-04-28 DIAGNOSIS — I2510 Atherosclerotic heart disease of native coronary artery without angina pectoris: Secondary | ICD-10-CM | POA: Diagnosis present

## 2020-04-28 DIAGNOSIS — E039 Hypothyroidism, unspecified: Secondary | ICD-10-CM | POA: Diagnosis not present

## 2020-04-28 DIAGNOSIS — E890 Postprocedural hypothyroidism: Secondary | ICD-10-CM | POA: Diagnosis present

## 2020-04-28 DIAGNOSIS — K219 Gastro-esophageal reflux disease without esophagitis: Secondary | ICD-10-CM | POA: Diagnosis present

## 2020-04-28 DIAGNOSIS — J441 Chronic obstructive pulmonary disease with (acute) exacerbation: Secondary | ICD-10-CM | POA: Diagnosis present

## 2020-04-28 DIAGNOSIS — I130 Hypertensive heart and chronic kidney disease with heart failure and stage 1 through stage 4 chronic kidney disease, or unspecified chronic kidney disease: Secondary | ICD-10-CM | POA: Diagnosis present

## 2020-04-28 DIAGNOSIS — R509 Fever, unspecified: Secondary | ICD-10-CM | POA: Diagnosis not present

## 2020-04-28 DIAGNOSIS — Z79890 Hormone replacement therapy (postmenopausal): Secondary | ICD-10-CM | POA: Diagnosis not present

## 2020-04-28 DIAGNOSIS — G9389 Other specified disorders of brain: Secondary | ICD-10-CM | POA: Diagnosis present

## 2020-04-28 DIAGNOSIS — I5032 Chronic diastolic (congestive) heart failure: Secondary | ICD-10-CM | POA: Diagnosis present

## 2020-04-28 DIAGNOSIS — Z20822 Contact with and (suspected) exposure to covid-19: Secondary | ICD-10-CM | POA: Diagnosis present

## 2020-04-28 DIAGNOSIS — Z90710 Acquired absence of both cervix and uterus: Secondary | ICD-10-CM | POA: Diagnosis not present

## 2020-04-28 DIAGNOSIS — R400 Somnolence: Secondary | ICD-10-CM

## 2020-04-28 DIAGNOSIS — N183 Chronic kidney disease, stage 3 unspecified: Secondary | ICD-10-CM | POA: Diagnosis present

## 2020-04-28 DIAGNOSIS — N1832 Chronic kidney disease, stage 3b: Secondary | ICD-10-CM | POA: Diagnosis not present

## 2020-04-28 DIAGNOSIS — J410 Simple chronic bronchitis: Secondary | ICD-10-CM | POA: Diagnosis not present

## 2020-04-28 LAB — CBC WITH DIFFERENTIAL/PLATELET
Abs Immature Granulocytes: 0.03 10*3/uL (ref 0.00–0.07)
Basophils Absolute: 0 10*3/uL (ref 0.0–0.1)
Basophils Relative: 0 %
Eosinophils Absolute: 0.2 10*3/uL (ref 0.0–0.5)
Eosinophils Relative: 2 %
HCT: 34.7 % — ABNORMAL LOW (ref 36.0–46.0)
Hemoglobin: 11.7 g/dL — ABNORMAL LOW (ref 12.0–15.0)
Immature Granulocytes: 0 %
Lymphocytes Relative: 5 %
Lymphs Abs: 0.5 10*3/uL — ABNORMAL LOW (ref 0.7–4.0)
MCH: 30.4 pg (ref 26.0–34.0)
MCHC: 33.7 g/dL (ref 30.0–36.0)
MCV: 90.1 fL (ref 80.0–100.0)
Monocytes Absolute: 0.5 10*3/uL (ref 0.1–1.0)
Monocytes Relative: 5 %
Neutro Abs: 8.6 10*3/uL — ABNORMAL HIGH (ref 1.7–7.7)
Neutrophils Relative %: 88 %
Platelets: 245 10*3/uL (ref 150–400)
RBC: 3.85 MIL/uL — ABNORMAL LOW (ref 3.87–5.11)
RDW: 14.2 % (ref 11.5–15.5)
WBC: 9.8 10*3/uL (ref 4.0–10.5)
nRBC: 0 % (ref 0.0–0.2)

## 2020-04-28 LAB — URINALYSIS, COMPLETE (UACMP) WITH MICROSCOPIC
Bacteria, UA: NONE SEEN
Bilirubin Urine: NEGATIVE
Glucose, UA: NEGATIVE mg/dL
Hgb urine dipstick: NEGATIVE
Ketones, ur: NEGATIVE mg/dL
Leukocytes,Ua: NEGATIVE
Nitrite: NEGATIVE
Protein, ur: NEGATIVE mg/dL
Specific Gravity, Urine: 1.002 — ABNORMAL LOW (ref 1.005–1.030)
Squamous Epithelial / HPF: NONE SEEN (ref 0–5)
pH: 6 (ref 5.0–8.0)

## 2020-04-28 LAB — COMPREHENSIVE METABOLIC PANEL
ALT: 21 U/L (ref 0–44)
AST: 31 U/L (ref 15–41)
Albumin: 3.6 g/dL (ref 3.5–5.0)
Alkaline Phosphatase: 55 U/L (ref 38–126)
Anion gap: 11 (ref 5–15)
BUN: 34 mg/dL — ABNORMAL HIGH (ref 8–23)
CO2: 27 mmol/L (ref 22–32)
Calcium: 8.5 mg/dL — ABNORMAL LOW (ref 8.9–10.3)
Chloride: 94 mmol/L — ABNORMAL LOW (ref 98–111)
Creatinine, Ser: 1.36 mg/dL — ABNORMAL HIGH (ref 0.44–1.00)
GFR, Estimated: 38 mL/min — ABNORMAL LOW (ref >60)
Glucose, Bld: 129 mg/dL — ABNORMAL HIGH (ref 70–99)
Potassium: 3.2 mmol/L — ABNORMAL LOW (ref 3.5–5.1)
Sodium: 132 mmol/L — ABNORMAL LOW (ref 135–145)
Total Bilirubin: 1.5 mg/dL — ABNORMAL HIGH (ref 0.3–1.2)
Total Protein: 6.8 g/dL (ref 6.5–8.1)

## 2020-04-28 LAB — RESP PANEL BY RT-PCR (FLU A&B, COVID) ARPGX2
Influenza A by PCR: NEGATIVE
Influenza B by PCR: NEGATIVE
SARS Coronavirus 2 by RT PCR: NEGATIVE

## 2020-04-28 LAB — MAGNESIUM: Magnesium: 2.3 mg/dL (ref 1.7–2.4)

## 2020-04-28 LAB — TSH: TSH: 1.447 u[IU]/mL (ref 0.350–4.500)

## 2020-04-28 LAB — CBG MONITORING, ED: Glucose-Capillary: 128 mg/dL — ABNORMAL HIGH (ref 70–99)

## 2020-04-28 LAB — TROPONIN I (HIGH SENSITIVITY): Troponin I (High Sensitivity): 10 ng/L (ref <18)

## 2020-04-28 LAB — T4, FREE: Free T4: 1.76 ng/dL — ABNORMAL HIGH (ref 0.61–1.12)

## 2020-04-28 MED ORDER — IOHEXOL 350 MG/ML SOLN
75.0000 mL | Freq: Once | INTRAVENOUS | Status: AC | PRN
  Administered 2020-04-28: 75 mL via INTRAVENOUS

## 2020-04-28 MED ORDER — TRAMADOL HCL 50 MG PO TABS
50.0000 mg | ORAL_TABLET | Freq: Two times a day (BID) | ORAL | Status: DC | PRN
  Administered 2020-04-30: 50 mg via ORAL
  Filled 2020-04-28: qty 1

## 2020-04-28 MED ORDER — ALBUTEROL SULFATE (2.5 MG/3ML) 0.083% IN NEBU: 2.5000 mg | INHALATION_SOLUTION | RESPIRATORY_TRACT | Status: DC | PRN

## 2020-04-28 MED ORDER — OXYBUTYNIN CHLORIDE 5 MG PO TABS
5.0000 mg | ORAL_TABLET | Freq: Three times a day (TID) | ORAL | Status: DC
  Administered 2020-04-29 – 2020-05-01 (×7): 5 mg via ORAL
  Filled 2020-04-28 (×9): qty 1

## 2020-04-28 MED ORDER — ONDANSETRON HCL 4 MG/2ML IJ SOLN: 4.0000 mg | Freq: Four times a day (QID) | INTRAMUSCULAR | Status: DC | PRN

## 2020-04-28 MED ORDER — ALLOPURINOL 100 MG PO TABS
100.0000 mg | ORAL_TABLET | Freq: Two times a day (BID) | ORAL | Status: DC
  Administered 2020-04-29: 100 mg via ORAL
  Filled 2020-04-28 (×3): qty 1

## 2020-04-28 MED ORDER — ONDANSETRON HCL 4 MG PO TABS: 4.0000 mg | ORAL_TABLET | Freq: Four times a day (QID) | ORAL | Status: DC | PRN

## 2020-04-28 MED ORDER — SODIUM CHLORIDE 0.9 % IV SOLN: INTRAVENOUS | Status: AC

## 2020-04-28 MED ORDER — DULOXETINE HCL 20 MG PO CPEP
20.0000 mg | ORAL_CAPSULE | Freq: Every day | ORAL | Status: DC
  Administered 2020-04-29 – 2020-05-01 (×3): 20 mg via ORAL
  Filled 2020-04-28 (×3): qty 1

## 2020-04-28 MED ORDER — HEPARIN SODIUM (PORCINE) 5000 UNIT/ML IJ SOLN
5000.0000 [IU] | Freq: Three times a day (TID) | INTRAMUSCULAR | Status: DC
  Administered 2020-04-29 – 2020-05-01 (×8): 5000 [IU] via SUBCUTANEOUS
  Filled 2020-04-28 (×8): qty 1

## 2020-04-28 MED ORDER — SODIUM CHLORIDE 0.9 % IV SOLN: 500.0000 mg | INTRAVENOUS | Status: DC

## 2020-04-28 MED ORDER — SODIUM CHLORIDE 0.9 % IV SOLN
1.5000 g | Freq: Two times a day (BID) | INTRAVENOUS | Status: DC
  Administered 2020-04-29 – 2020-05-01 (×5): 1.5 g via INTRAVENOUS
  Filled 2020-04-28 (×3): qty 4
  Filled 2020-04-28: qty 1.5
  Filled 2020-04-28: qty 4
  Filled 2020-04-28: qty 1.5
  Filled 2020-04-28: qty 4
  Filled 2020-04-28: qty 1.5

## 2020-04-28 MED ORDER — ROPINIROLE HCL 1 MG PO TABS
2.0000 mg | ORAL_TABLET | Freq: Two times a day (BID) | ORAL | Status: DC
  Administered 2020-04-29 – 2020-05-01 (×5): 2 mg via ORAL
  Filled 2020-04-28 (×5): qty 2

## 2020-04-28 MED ORDER — ACETAMINOPHEN 325 MG PO TABS
650.0000 mg | ORAL_TABLET | Freq: Four times a day (QID) | ORAL | Status: DC | PRN
  Administered 2020-04-29: 650 mg via ORAL
  Filled 2020-04-28: qty 2

## 2020-04-28 MED ORDER — LEVOTHYROXINE SODIUM 50 MCG PO TABS
100.0000 ug | ORAL_TABLET | Freq: Every day | ORAL | Status: DC
  Administered 2020-04-29: 100 ug via ORAL
  Filled 2020-04-28: qty 2

## 2020-04-28 MED ORDER — AZITHROMYCIN 500 MG PO TABS: 500.0000 mg | ORAL_TABLET | Freq: Every day | ORAL | Status: DC

## 2020-04-28 MED ORDER — AZELASTINE HCL 0.1 % NA SOLN
1.0000 | Freq: Two times a day (BID) | NASAL | Status: DC | PRN
  Filled 2020-04-28: qty 30

## 2020-04-28 MED ORDER — ALBUTEROL SULFATE (2.5 MG/3ML) 0.083% IN NEBU: 2.5000 mg | INHALATION_SOLUTION | Freq: Four times a day (QID) | RESPIRATORY_TRACT | Status: DC | PRN

## 2020-04-28 MED ORDER — PANTOPRAZOLE SODIUM 40 MG PO TBEC
40.0000 mg | DELAYED_RELEASE_TABLET | Freq: Every day | ORAL | Status: DC
  Administered 2020-04-29 – 2020-05-01 (×3): 40 mg via ORAL
  Filled 2020-04-28 (×3): qty 1

## 2020-04-28 MED ORDER — ACETAMINOPHEN 650 MG RE SUPP: 650.0000 mg | Freq: Four times a day (QID) | RECTAL | Status: DC | PRN

## 2020-04-28 MED ORDER — FAMOTIDINE 20 MG PO TABS
40.0000 mg | ORAL_TABLET | Freq: Every day | ORAL | Status: DC
Start: 2020-04-28 — End: 2020-05-01
  Administered 2020-04-29 – 2020-04-30 (×2): 40 mg via ORAL
  Filled 2020-04-28 (×2): qty 2

## 2020-04-28 NOTE — H&P (Signed)
History and Physical    [name] YJE:[id number] DOB: 12-22-1932 DOA: 04/28/2020  PCP: Tracie Harrier, MD    Patient coming from:     Chief Complaint:  AMS   HPI: [name] is a 87 y.o. female seen in ed for AMS with confusion.Seen at pcp and found to have high white count and started on cefdinir and daughter brought her to ed for lethargy. She reports mom is very sleepy and falls asleep while standing sometimes, she has had a sleep study eval for osa per her report.Say her endo has increased the levothyroxine and since then she is more awake. Pt was very lethargic and when she went to check on her today she was worried and brought her here. Yesterday she did have a fever of 102 with white count and was started on abx.     Pt has PMH significant of UTI, COPD, GERD, Hypothyroidism, Lymphoma,CHF,CAD,Hyperlipidemia.  ED Course:  Vitals:   04/28/20 1657 04/28/20 1855 04/28/20 1930 04/28/20 2000  BP: 104/63  102/62 (!) 113/57  Pulse: 92 83 88 96  Resp: (!) 23 (!) 25 (!) 23 (!) 21  Temp: 99 F (37.2 C)     TempSrc: Oral     SpO2: 95% 100% 97% 93%  Weight: 71.7 kg     Height: 5\' 1"  (1.549 m)     Pt is alert/awake and oriented in ed and covid is negative, pt is coughing ,troponin is negative.  Labs show hyponatremia of 132, hypochloremia of 94 indicative more of dehydration, potassium of 3.2, serum creatinine of 1.36 which is an improvement from her prior creatinine of 1.52 in December, normal LFTs, total bili of 1.5, CBC shows normal white count of 9.8, hemoglobin of 11.7, hematocrit of 34.7.  Free T4 mildly elevated at 1.76, TSH of 1.447.  UA done today was clear, noncontrast head CT showed no acute findings, CT angio of the chest / ABD and pelvis shows: IMPRESSION: 1. No evidence of pulmonary embolus. 2. Bibasilar bronchial wall thickening consistent with bronchitis. Ground-glass attenuation at the bases consistent with small airway disease and air trapping. 3. No acute  intra-abdominal or intrapelvic process. 4. Moderate fecal retention. 5. Aortic Atherosclerosis (ICD10-I70.0).   Review of Systems:  Review of Systems  Unable to perform ROS: Mental status change (Lethargic.)     Past Medical History:  Diagnosis Date  . Afib ([place])   . Anxiety   . Anxiety   . Basal cell carcinoma 11/11/2017   Right inferior knee. Superficial and nodular patterns.  . Bronchitis    recent  . Cancer ([place])    Lymphoma   . Chronic kidney disease    ckd stage 3  . Congestive heart failure (CHF) ([place])   . Coronary artery disease    Status post stent placement in LAD  . Dysrhythmia   . GERD (gastroesophageal reflux disease)   . Hyperlipemia   . Hypertension   . Hypothyroidism   . Thyroid disease     Past Surgical History:  Procedure Laterality Date  . ABDOMINAL HYSTERECTOMY     complete  . CORONARY ANGIOPLASTY     stent  . CORONARY STENT PLACEMENT    . EXCISION OF ABDOMINAL WALL TUMOR Right 05/08/2018   Procedure: EXCISION OF ABDOMINAL WALL-RIGHT;  Surgeon: Herbert Pun, MD;  Location: [facility] ORS;  Service: General;  Laterality: Right;  . INCISIONAL HERNIA REPAIR N/A 05/08/2018   Procedure: LAPAROSCOPIC VS. OPEN INCISIONAL HERNIA REPAIR WITH MESH;  Surgeon: Windell Moment,  Reeves Forth, MD;  Location: [facility] ORS;  Service: General;  Laterality: N/A;  . THYROIDECTOMY Bilateral 11/17/2019   Procedure: TOTAL THYROIDECTOMY;  Surgeon: Margaretha Sheffield, MD;  Location: [facility] ORS;  Service: ENT;  Laterality: Bilateral;  . VEIN SURGERY     Endovenous ablation of saphenous vein     reports that she has never smoked. She has never used smokeless tobacco. She reports that she does not drink alcohol and does not use drugs.  Allergies  Allergen Reactions  . Sulfa Antibiotics Hives and Rash    Family History  Problem Relation Age of Onset  . Heart Problems Mother   . Heart Problems Father   . Heart attack Father   . Diabetes Brother   . Diabetes Brother     Prior  to Admission medications   Medication Sig Start Date End Date Taking? Authorizing Provider  albuterol (VENTOLIN HFA) 108 (90 Base) MCG/ACT inhaler Inhale 2 puffs into the lungs every 6 (six) hours as needed for wheezing or shortness of breath. 03/01/19   Merlyn Lot, MD  allopurinol (ZYLOPRIM) 100 MG tablet Take 100 mg by mouth 2 (two) times daily.  09/09/17   [provider]  azelastine (ASTELIN) 0.1 % nasal spray Place 1 spray into both nostrils 2 (two) times daily as needed (allergies.).  09/18/19   [provider]  Calcium Carb-Cholecalciferol (CALCIUM + D3 PO) Take 1 tablet by mouth in the morning and at bedtime.    [provider]  diphenhydrAMINE (BENADRYL) 50 MG tablet Take 1 tablet (50 mg total) by mouth at bedtime as needed for itching. 04/15/20   Rudene Re, MD  DULoxetine (CYMBALTA) 20 MG capsule Take 20 mg by mouth daily.  04/14/19   [provider]  famotidine (PEPCID) 40 MG tablet Take 1 tablet (40 mg total) by mouth at bedtime for 4 days. 04/15/20 04/19/20  Rudene Re, MD  furosemide (LASIX) 40 MG tablet Take 40 mg by mouth daily at 2 PM.  06/05/18   [provider]  HYDROcodone-acetaminophen (NORCO/VICODIN) 5-325 MG tablet Take 1 tablet by mouth every 6 (six) hours as needed for moderate pain. 02/15/20   Nena Polio, MD  levothyroxine (SYNTHROID) 100 MCG tablet Take 100 mcg by mouth daily. 12/09/19   [provider]  liothyronine (CYTOMEL) 25 MCG tablet Take by mouth. 11/24/19   [provider]  lovastatin (MEVACOR) 40 MG tablet Take 40 mg by mouth daily with supper.  03/01/15   [provider]  oxybutynin (DITROPAN) 5 MG tablet Take 5 mg by mouth 3 (three) times daily. 08/14/19   [provider]  pantoprazole (PROTONIX) 40 MG tablet Take 40 mg by mouth daily. 04/14/19   [provider]  potassium chloride (K-DUR,KLOR-CON) 10 MEQ tablet Take 10 mEq by mouth daily.  05/23/14    [provider]  rOPINIRole (REQUIP) 2 MG tablet Take 2 mg by mouth 2 (two) times daily.    [provider]  tiZANidine (ZANAFLEX) 2 MG tablet Take 2 mg by mouth 3 (three) times daily as needed (spasms.).  06/25/18   [provider]  traMADol (ULTRAM) 50 MG tablet Take 50 mg by mouth 2 (two) times daily as needed. 11/27/19   [provider]  triamcinolone (KENALOG) 0.1 % Apply to rash on lower legs twice a day until improved. Avoid face, groin, underarms. 03/28/20   Brendolyn Patty, MD  Vitamin D3 (VITAMIN D) 25 MCG tablet Take 1,000 Units by mouth daily.  [provider]    Physical Exam: Vitals:   04/28/20 1657 04/28/20 1855 04/28/20 1930 04/28/20 2000  BP: 104/63  102/62 (!) 113/57  Pulse: 92 83 88 96  Resp: (!) 23 (!) 25 (!) 23 (!) 21  Temp: 99 F (37.2 C)     TempSrc: Oral     SpO2: 95% 100% 97% 93%  Weight: 71.7 kg     Height: 5\' 1"  (1.549 m)       Physical Exam Vitals and nursing note reviewed.  Constitutional:      General: She is not in acute distress.    Appearance: Normal appearance. She is well-groomed. She is not ill-appearing.  HENT:     Head: Normocephalic and atraumatic.     Right Ear: External ear normal.     Left Ear: External ear normal.     Mouth/Throat:     Mouth: Mucous membranes are moist.  Eyes:     Extraocular Movements: Extraocular movements intact.     Pupils: Pupils are equal, round, and reactive to light.  Neck:     Vascular: No carotid bruit.  Cardiovascular:     Rate and Rhythm: Normal rate and regular rhythm.     Pulses: Normal pulses.     Heart sounds: Normal heart sounds.  Pulmonary:     Effort: Pulmonary effort is normal.     Breath sounds: Wheezing present.  Abdominal:     General: Bowel sounds are normal. There is no distension.     Palpations: Abdomen is soft. There is no mass.     Tenderness: There is no abdominal tenderness. There is no guarding.     Hernia: No hernia is present.   Musculoskeletal:     Right lower leg: No edema.     Left lower leg: No edema.  Neurological:     General: No focal deficit present.     Mental Status: She is easily aroused. She is lethargic.  Psychiatric:        Behavior: Behavior is cooperative.      Labs on Admission: I have personally reviewed following labs and imaging studies Labs  No results for input(s): CKTOTAL, CKMB, TROPONINI in the last 72 hours. Lab Results  Component Value Date   WBC 9.8 04/28/2020   HGB 11.7 (L) 04/28/2020   HCT 34.7 (L) 04/28/2020   MCV 90.1 04/28/2020   PLT 245 04/28/2020    Recent Labs  Lab 04/28/20 1659  NA 132*  K 3.2*  CL 94*  CO2 27  BUN 34*  CREATININE 1.36*  CALCIUM 8.5*  PROT 6.8  BILITOT 1.5*  ALKPHOS 55  ALT 21  AST 31  GLUCOSE 129*   No results found for: CHOL, HDL, LDLCALC, TRIG No results found for: DDIMER Invalid input(s): POCBNP  Urinalysis    Component Value Date/Time   COLORURINE STRAW (A) 04/28/2020 1706   APPEARANCEUR CLEAR (A) 04/28/2020 1706   APPEARANCEUR Clear 03/28/2015 1537   LABSPEC 1.002 (L) 04/28/2020 1706   LABSPEC 1.002 12/06/2013 1654   PHURINE 6.0 04/28/2020 1706   GLUCOSEU NEGATIVE 04/28/2020 1706   GLUCOSEU Negative 12/06/2013 1654   HGBUR NEGATIVE 04/28/2020 [place] 04/28/2020 1706   BILIRUBINUR Negative 03/28/2015 1537   BILIRUBINUR Negative 12/06/2013 1654   KETONESUR NEGATIVE 04/28/2020 1706   PROTEINUR NEGATIVE 04/28/2020 1706   NITRITE NEGATIVE 04/28/2020 1706   LEUKOCYTESUR NEGATIVE 04/28/2020 1706   LEUKOCYTESUR Negative 12/06/2013 1654   COVID-19 Labs  No results for  input(s): DDIMER, FERRITIN, LDH, CRP in the last 72 hours.  Lab Results  Component Value Date   [place] NEGATIVE 04/28/2020   [place] NEGATIVE 11/15/2019   [place] NEGATIVE 03/01/2019    Radiological Exams on Admission: CT Head Wo Contrast  Result Date: 04/28/2020 CLINICAL DATA:  Confusion, weakness, dizziness EXAM:  CT HEAD WITHOUT CONTRAST TECHNIQUE: Contiguous axial images were obtained from the base of the skull through the vertex without intravenous contrast. COMPARISON:  02/15/2020 FINDINGS: Brain: No acute infarct or hemorrhage. Lateral ventricles and midline structures are unremarkable. No acute extra-axial fluid collections. No mass effect. Vascular: No hyperdense vessel or unexpected calcification. Skull: Normal. Negative for fracture or focal lesion. Sinuses/Orbits: No acute finding. Other: None. IMPRESSION: 1. Stable head CT, no acute intracranial process. Electronically Signed   By: Randa Ngo M.D.   On: 04/28/2020 20:59   CT Angio Chest PE W and/or Wo Contrast  Result Date: 04/28/2020 CLINICAL DATA:  Increasing confusing, weakness, elevated white blood cell count, lower abdominal pain EXAM: CT ANGIOGRAPHY CHEST CT ABDOMEN AND PELVIS WITH CONTRAST TECHNIQUE: Multidetector CT imaging of the chest was performed using the standard protocol during bolus administration of intravenous contrast. Multiplanar CT image reconstructions and MIPs were obtained to evaluate the vascular anatomy. Multidetector CT imaging of the abdomen and pelvis was performed using the standard protocol during bolus administration of intravenous contrast. CONTRAST:  28mL OMNIPAQUE IOHEXOL 350 MG/ML SOLN COMPARISON:  06/15/2019 FINDINGS: CTA CHEST FINDINGS Cardiovascular: This is a technically adequate evaluation of the pulmonary vasculature. No filling defects or pulmonary emboli. There is extensive calcification of the mitral annulus. No pericardial effusion. No evidence of thoracic aortic aneurysm or dissection. Continued atherosclerosis of the aorta and coronary vessels. Mediastinum/Nodes: No enlarged mediastinal, hilar, or axillary lymph nodes. Thyroid gland, trachea, and esophagus demonstrate no significant findings. Lungs/Pleura: Mosaic areas of ground-glass attenuation within the mid and lower lung zones, favor small airway disease  and air trapping. There is mild bibasilar bronchial wall thickening. No effusion or pneumothorax. Musculoskeletal: No acute or destructive bony lesions. Reconstructed images demonstrate no additional findings. Review of the MIP images confirms the above findings. CT ABDOMEN and PELVIS FINDINGS Hepatobiliary: No focal liver abnormality is seen. No gallstones, gallbladder wall thickening, or biliary dilatation. Pancreas: Unremarkable. No pancreatic ductal dilatation or surrounding inflammatory changes. Spleen: Normal in size without focal abnormality. Adrenals/Urinary Tract: Stable peripelvic cyst lower pole right kidney. Otherwise the kidneys enhance normally and symmetrically. No urinary tract calculi or evidence of obstruction. Bladder is moderately distended without focal abnormality. The adrenals are normal. Stomach/Bowel: No bowel obstruction or ileus. Moderate stool throughout the colon. No bowel wall thickening or inflammatory change. Vascular/Lymphatic: Aortic atherosclerosis. No enlarged abdominal or pelvic lymph nodes. Reproductive: Status post hysterectomy. No adnexal masses. Other: No free fluid or free gas. No abdominal wall hernia. Musculoskeletal: No acute or destructive bony lesions. Prominent spondylosis at L2-3. Reconstructed images demonstrate no additional findings. Review of the MIP images confirms the above findings. IMPRESSION: 1. No evidence of pulmonary embolus. 2. Bibasilar bronchial wall thickening consistent with bronchitis. Ground-glass attenuation at the bases consistent with small airway disease and air trapping. 3. No acute intra-abdominal or intrapelvic process. 4. Moderate fecal retention. 5. Aortic Atherosclerosis (ICD10-I70.0). Electronically Signed   By: Randa Ngo M.D.   On: 04/28/2020 21:09   CT ABDOMEN PELVIS W CONTRAST  Result Date: 04/28/2020 CLINICAL DATA:  Increasing confusing, weakness, elevated white blood cell count, lower abdominal pain EXAM: CT ANGIOGRAPHY CHEST  CT ABDOMEN  AND PELVIS WITH CONTRAST TECHNIQUE: Multidetector CT imaging of the chest was performed using the standard protocol during bolus administration of intravenous contrast. Multiplanar CT image reconstructions and MIPs were obtained to evaluate the vascular anatomy. Multidetector CT imaging of the abdomen and pelvis was performed using the standard protocol during bolus administration of intravenous contrast. CONTRAST:  18mL OMNIPAQUE IOHEXOL 350 MG/ML SOLN COMPARISON:  06/15/2019 FINDINGS: CTA CHEST FINDINGS Cardiovascular: This is a technically adequate evaluation of the pulmonary vasculature. No filling defects or pulmonary emboli. There is extensive calcification of the mitral annulus. No pericardial effusion. No evidence of thoracic aortic aneurysm or dissection. Continued atherosclerosis of the aorta and coronary vessels. Mediastinum/Nodes: No enlarged mediastinal, hilar, or axillary lymph nodes. Thyroid gland, trachea, and esophagus demonstrate no significant findings. Lungs/Pleura: Mosaic areas of ground-glass attenuation within the mid and lower lung zones, favor small airway disease and air trapping. There is mild bibasilar bronchial wall thickening. No effusion or pneumothorax. Musculoskeletal: No acute or destructive bony lesions. Reconstructed images demonstrate no additional findings. Review of the MIP images confirms the above findings. CT ABDOMEN and PELVIS FINDINGS Hepatobiliary: No focal liver abnormality is seen. No gallstones, gallbladder wall thickening, or biliary dilatation. Pancreas: Unremarkable. No pancreatic ductal dilatation or surrounding inflammatory changes. Spleen: Normal in size without focal abnormality. Adrenals/Urinary Tract: Stable peripelvic cyst lower pole right kidney. Otherwise the kidneys enhance normally and symmetrically. No urinary tract calculi or evidence of obstruction. Bladder is moderately distended without focal abnormality. The adrenals are normal.  Stomach/Bowel: No bowel obstruction or ileus. Moderate stool throughout the colon. No bowel wall thickening or inflammatory change. Vascular/Lymphatic: Aortic atherosclerosis. No enlarged abdominal or pelvic lymph nodes. Reproductive: Status post hysterectomy. No adnexal masses. Other: No free fluid or free gas. No abdominal wall hernia. Musculoskeletal: No acute or destructive bony lesions. Prominent spondylosis at L2-3. Reconstructed images demonstrate no additional findings. Review of the MIP images confirms the above findings. IMPRESSION: 1. No evidence of pulmonary embolus. 2. Bibasilar bronchial wall thickening consistent with bronchitis. Ground-glass attenuation at the bases consistent with small airway disease and air trapping. 3. No acute intra-abdominal or intrapelvic process. 4. Moderate fecal retention. 5. Aortic Atherosclerosis (ICD10-I70.0). Electronically Signed   By: Randa Ngo M.D.   On: 04/28/2020 21:09    EKG: Independently reviewed.  NSR at 90.  ECHO: 05/09/2018: IMPRESSIONS  1. The left ventricle has normal systolic function, with an ejection  fraction of 55-60%. The cavity size was normal. Left ventricular diastolic  Doppler parameters are consistent with impaired relaxation.  2. The right ventricle has normal systolic function. The cavity was  normal. There is no increase in right ventricular wall thickness.  3. Left atrial size was mildly dilated.  4. The mitral valve is degenerative. Moderate thickening of the mitral  valve leaflet. Severe calcification of the mitral valve leaflet. Mitral  valve regurgitation is moderate by color flow Doppler.  5. The aortic valve is tricuspid Moderate thickening of the aortic valve  Mild calcification of the aortic valve. Aortic valve regurgitation is mild  by color flow Doppler. mild stenosis of the aortic valve.   Assessment/Plan Principal Problem:   AMS (altered mental status) Active Problems:   HLD (hyperlipidemia)    Chronic kidney disease (CKD), stage III (moderate) (HCC)   Chronic obstructive pulmonary disease (HCC)   Gastro-esophageal reflux disease without esophagitis   Hypothyroidism   Hypertension   Coronary artery disease   Congestive heart failure (CHF) (HCC)   AMS: D/D include osa related,  metabolic, infectious related ? PNA We will admit to med surg with tele and neuro check. We will obtain an MRI of brain [place]. Due to fever and white count will start pt on empiric aspn pna coverage with Unasyn.   HLD: Holding mevacor until cpkl is resulted.   CKD Stage III: Creatinine has improved from 1.5 to 1.3 and pt would benefit from cta chest for ? PE we will obtain Dimer.   COPD: Stable. Cont albuterol PRN.  GERD: Cont ppi and famotidine.  Hypothyroidism: Pt is on cytomel and levothyroxine, Uncomfortable with dose change and will defer to her pcp/ endo for further management For her elevated ft4 level.  HTN: Pt is not on any anti htn meds except for lasix , which I have held due to abnormal creatinine. PRN hydralazine.   CAD; Pt is not on aspirin at home and is on mevacor. We will restart mevacor once cpk is normal.  CHF: Per echo in 2020 pt EF is 55-60% with impaired diastolic function. Echo repeat to assess EF.cycle troponin for AMS.  DVT prophylaxis:  Heparin  Code Status:  Full Code  Family Communication:  Jonah Blue (Daughter)  [phone] (Mobile)  Disposition Plan:  TBD  Consults called:  None  Admission status: Inpatient.    Para Skeans MD [facility] [phone] How to contact the [facility] Attending or Consulting provider [place] or covering provider during after hours [place], for this patient?    1. Check the care team in [facility] and look for a) attending/consulting [facility] provider listed and b) the [facility] team listed 2. Log into [email or web address] and use [place]'s universal password to access. If you do not have the password, please contact the  hospital operator. 3. Locate the [facility] provider you are looking for under [facility] and page to a number that you can be directly reached. 4. If you still have difficulty reaching the provider, please page the [facility] (Director on Call) for the Hospitalists listed on amion for assistance. [email or web address] Password [facility] 04/28/2020, 10:28 PM

## 2020-04-28 NOTE — ED Notes (Addendum)
Attempted IV x3 without success.  IV team consulted.

## 2020-04-28 NOTE — ED Notes (Signed)
See triage note. Pt reports weakness since having thyroid removed in November. Daughter at bedside reports pt has had increased weakness, urination and confusion since yesterday. Started on antibiotics yesterday. Pt reports she hasnt had a BM in 2 weeks, daughter reports that is not accurate.  Pt oriented to person, place, year NAD noted

## 2020-04-28 NOTE — ED Triage Notes (Signed)
Pt to ED via POV with daughter, reports increasing confusion and weakness, reports had negative UA at PCP but had increased WBC so PCP put her on abx. Pt's daughter reports that pt went from fidgety and not sitting still to lethargic and difficulty staying awake. Pt's daughter reports has had 3 rounds of abx and has not eaten today.

## 2020-04-28 NOTE — ED Provider Notes (Signed)
[facility] Emergency Department Provider Note  ____________________________________________   Event Date/Time   First MD Initiated Contact with Patient 04/28/20 1857     (approximate)  I have reviewed the triage vital signs and the nursing notes.   HISTORY  Chief Complaint Weakness and Altered Mental Status    HPI [name] is a 87 y.o. female with CHF, CAD, CKD, who comes in with weakness and altered mental status.  Per daughter patient had increasing confusion and weakness.  They were seen yesterday by her PCP and the urine was reassuring but given patient had a white count that was slightly elevated they were started on cefdinir.  Today they noted that she seems to be more lethargic and more sleepy than normal.  She is not eating or drinking as much.  Is been constant, nothing makes it better, nothing makes it worse.  She denies any known falls.  Patient did have her thyroid removed.  She also does report some abdominal pain and feeling more short of breath than normal.          Past Medical History:  Diagnosis Date  . Afib ([place])   . Anxiety   . Anxiety   . Basal cell carcinoma 11/11/2017   Right inferior knee. Superficial and nodular patterns.  . Bronchitis    recent  . Cancer ([place])    Lymphoma   . Chronic kidney disease    ckd stage 3  . Congestive heart failure (CHF) ([place])   . Coronary artery disease    Status post stent placement in LAD  . Dysrhythmia   . GERD (gastroesophageal reflux disease)   . Hyperlipemia   . Hypertension   . Hypothyroidism   . Thyroid disease     Patient Active Problem List   Diagnosis Date Noted  . S/P complete thyroidectomy 11/17/2019  . Acute cystitis without hematuria 09/28/2019  . Varicose veins of leg with pain, bilateral 08/10/2019  . Swelling of limb 06/01/2019  . Leg pain 03/10/2019  . History of lymphoma 03/08/2019  . Vascular disease 11/25/2018  . Chronic obstructive pulmonary disease  ([place]) 05/22/2018  . Incisional hernia 05/08/2018  . Thyroid nodule 02/04/2018  . Gastro-esophageal reflux disease without esophagitis 07/24/2017  . Left carpal tunnel syndrome 03/29/2016  . Varicose veins of leg with pain, left 01/23/2016  . Gross hematuria 03/28/2015  . Nephrolithiasis 03/28/2015  . Atrial fibrillation ([place]) 03/28/2015  . Chronic kidney disease (CKD), stage III (moderate) ([place]) 03/23/2015  . Pneumonia 02/10/2015  . Disease of thyroid gland 07/29/2014  . HLD (hyperlipidemia) 07/29/2014  . Clinical depression 07/29/2014  . Arteriosclerosis of coronary artery 07/29/2014  . A-fib ([place]) 07/29/2014  . Anxiety 07/29/2014  . Diverticulitis 07/23/2014  . Edema leg 07/08/2014  . Acute bronchitis due to infection 01/24/2014  . Lumbar canal stenosis 12/13/2013  . Neuritis or radiculitis due to rupture of lumbar intervertebral disc 12/13/2013  . Lumbar radiculitis 12/13/2013    Past Surgical History:  Procedure Laterality Date  . ABDOMINAL HYSTERECTOMY     complete  . CORONARY ANGIOPLASTY     stent  . CORONARY STENT PLACEMENT    . EXCISION OF ABDOMINAL WALL TUMOR Right 05/08/2018   Procedure: EXCISION OF ABDOMINAL WALL-RIGHT;  Surgeon: Herbert Pun, MD;  Location: [facility] ORS;  Service: General;  Laterality: Right;  . INCISIONAL HERNIA REPAIR N/A 05/08/2018   Procedure: LAPAROSCOPIC VS. OPEN INCISIONAL HERNIA REPAIR WITH MESH;  Surgeon: Herbert Pun, MD;  Location: [facility] ORS;  Service: General;  Laterality: N/A;  . THYROIDECTOMY Bilateral 11/17/2019   Procedure: TOTAL THYROIDECTOMY;  Surgeon: Margaretha Sheffield, MD;  Location: [facility] ORS;  Service: ENT;  Laterality: Bilateral;  . VEIN SURGERY     Endovenous ablation of saphenous vein    Prior to Admission medications   Medication Sig Start Date End Date Taking? Authorizing Provider  albuterol (VENTOLIN HFA) 108 (90 Base) MCG/ACT inhaler Inhale 2 puffs into the lungs every 6 (six) hours as needed for wheezing or  shortness of breath. 03/01/19   Merlyn Lot, MD  allopurinol (ZYLOPRIM) 100 MG tablet Take 100 mg by mouth 2 (two) times daily.  09/09/17   [provider]  azelastine (ASTELIN) 0.1 % nasal spray Place 1 spray into both nostrils 2 (two) times daily as needed (allergies.).  09/18/19   [provider]  Calcium Carb-Cholecalciferol (CALCIUM + D3 PO) Take 1 tablet by mouth in the morning and at bedtime.    [provider]  diphenhydrAMINE (BENADRYL) 50 MG tablet Take 1 tablet (50 mg total) by mouth at bedtime as needed for itching. 04/15/20   Rudene Re, MD  DULoxetine (CYMBALTA) 20 MG capsule Take 20 mg by mouth daily.  04/14/19   [provider]  famotidine (PEPCID) 40 MG tablet Take 1 tablet (40 mg total) by mouth at bedtime for 4 days. 04/15/20 04/19/20  Rudene Re, MD  furosemide (LASIX) 40 MG tablet Take 40 mg by mouth daily at 2 PM.  06/05/18   [provider]  HYDROcodone-acetaminophen (NORCO/VICODIN) 5-325 MG tablet Take 1 tablet by mouth every 6 (six) hours as needed for moderate pain. 02/15/20   Nena Polio, MD  levothyroxine (SYNTHROID) 100 MCG tablet Take 100 mcg by mouth daily. 12/09/19   [provider]  liothyronine (CYTOMEL) 25 MCG tablet Take by mouth. 11/24/19   [provider]  lovastatin (MEVACOR) 40 MG tablet Take 40 mg by mouth daily with supper.  03/01/15   [provider]  oxybutynin (DITROPAN) 5 MG tablet Take 5 mg by mouth 3 (three) times daily. 08/14/19   [provider]  pantoprazole (PROTONIX) 40 MG tablet Take 40 mg by mouth daily. 04/14/19   [provider]  potassium chloride (K-DUR,KLOR-CON) 10 MEQ tablet Take 10 mEq by mouth daily.  05/23/14   [provider]  rOPINIRole (REQUIP) 2 MG tablet Take 2 mg by mouth 2 (two) times daily.    [provider]  tiZANidine (ZANAFLEX) 2 MG tablet Take 2 mg by mouth 3 (three) times daily as needed (spasms.).  06/25/18    [provider]  traMADol (ULTRAM) 50 MG tablet Take 50 mg by mouth 2 (two) times daily as needed. 11/27/19   [provider]  triamcinolone (KENALOG) 0.1 % Apply to rash on lower legs twice a day until improved. Avoid face, groin, underarms. 03/28/20   Brendolyn Patty, MD  Vitamin D3 (VITAMIN D) 25 MCG tablet Take 1,000 Units by mouth daily.    [provider]    Allergies Sulfa antibiotics  Family History  Problem Relation Age of Onset  . Heart Problems Mother   . Heart Problems Father   . Heart attack Father   . Diabetes Brother   . Diabetes Brother     Social History Social History   Tobacco Use  . Smoking status: Never Smoker  . Smokeless tobacco: Never Used  Vaping Use  . Vaping Use: Never used  Substance Use Topics  . Alcohol use: No  .  Drug use: No      Review of Systems Constitutional: No fever/chills Eyes: No visual changes. ENT: No sore throat. Cardiovascular: Denies chest pain. Respiratory: Positive shortness of breath Gastrointestinal: Positive abdominal pain no nausea, no vomiting.  No diarrhea.  No constipation. Genitourinary: Negative for dysuria. Musculoskeletal: Negative for back pain. Skin: Negative for rash. Neurological: Negative for headaches, focal weakness or numbness.  Positive confusion All other ROS negative ____________________________________________   PHYSICAL EXAM:  VITAL SIGNS: ED Triage Vitals  Enc Vitals Group     BP 04/28/20 1657 104/63     Pulse Rate 04/28/20 1657 92     Resp 04/28/20 1657 (!) 23     Temp 04/28/20 1657 99 F (37.2 C)     Temp Source 04/28/20 1657 Oral     SpO2 04/28/20 1657 95 %     Weight 04/28/20 1657 158 lb (71.7 kg)     Height 04/28/20 1657 5\' 1"  (1.549 m)     Head Circumference --      Peak Flow --      Pain Score 04/28/20 1704 0     Pain Loc --      Pain Edu? --      Excl. in [place]? --     Constitutional: Alert and oriented x2. Appears sleepy but wakes up and follow  commands Eyes: Conjunctivae are normal. EOMI. Head: Atraumatic. Nose: No congestion/rhinnorhea. Mouth/Throat: Mucous membranes are moist.   Neck: No stridor. Trachea Midline. FROM Cardiovascular: Normal rate, regular rhythm. Grossly normal heart sounds.  Good peripheral circulation. Respiratory: Normal respiratory effort.  No retractions. Lungs CTAB. Gastrointestinal: Tender in the lower abdomen.. No distention. No abdominal bruits.  Musculoskeletal: 1+ edema bilaterally with slightly warm legs. (baseline per daughter) Neurologic:  Normal speech and language. No gross focal neurologic deficits are appreciated.  Equal strength in arms and legs.  Skin:  Skin is warm, dry and intact. No rash noted. Psychiatric: Mood and affect are normal. Speech and behavior are normal. GU: Deferred   ____________________________________________   LABS (all labs ordered are listed, but only abnormal results are displayed)  Labs Reviewed  URINALYSIS, COMPLETE (UACMP) WITH MICROSCOPIC - Abnormal; Notable for the following components:      Result Value   Color, Urine STRAW (*)    APPearance CLEAR (*)    Specific Gravity, Urine 1.002 (*)    All other components within normal limits  CBC WITH DIFFERENTIAL/PLATELET - Abnormal; Notable for the following components:   RBC 3.85 (*)    Hemoglobin 11.7 (*)    HCT 34.7 (*)    Neutro Abs 8.6 (*)    Lymphs Abs 0.5 (*)    All other components within normal limits  COMPREHENSIVE METABOLIC PANEL - Abnormal; Notable for the following components:   Sodium 132 (*)    Potassium 3.2 (*)    Chloride 94 (*)    Glucose, Bld 129 (*)    BUN 34 (*)    Creatinine, Ser 1.36 (*)    Calcium 8.5 (*)    Total Bilirubin 1.5 (*)    GFR, Estimated 38 (*)    All other components within normal limits  T4, FREE - Abnormal; Notable for the following components:   Free T4 1.76 (*)    All other components within normal limits  CBG MONITORING, ED - Abnormal; Notable for the  following components:   Glucose-Capillary 128 (*)    All other components within normal limits  RESP PANEL BY RT-PCR (FLU A&B,  COVID) ARPGX2  TSH  MAGNESIUM  TROPONIN I (HIGH SENSITIVITY)  TROPONIN I (HIGH SENSITIVITY)   ____________________________________________   ED ECG REPORT I, Vanessa Nambe, the attending physician, personally viewed and interpreted this ECG.  Normal sinus rate of 90, no ST elevation, no T wave inversions, normal intervals ____________________________________________  RADIOLOGY   Official radiology report(s): CT Head Wo Contrast  Result Date: 04/28/2020 CLINICAL DATA:  Confusion, weakness, dizziness EXAM: CT HEAD WITHOUT CONTRAST TECHNIQUE: Contiguous axial images were obtained from the base of the skull through the vertex without intravenous contrast. COMPARISON:  02/15/2020 FINDINGS: Brain: No acute infarct or hemorrhage. Lateral ventricles and midline structures are unremarkable. No acute extra-axial fluid collections. No mass effect. Vascular: No hyperdense vessel or unexpected calcification. Skull: Normal. Negative for fracture or focal lesion. Sinuses/Orbits: No acute finding. Other: None. IMPRESSION: 1. Stable head CT, no acute intracranial process. Electronically Signed   By: Randa Ngo M.D.   On: 04/28/2020 20:59   CT Angio Chest PE W and/or Wo Contrast  Result Date: 04/28/2020 CLINICAL DATA:  Increasing confusing, weakness, elevated white blood cell count, lower abdominal pain EXAM: CT ANGIOGRAPHY CHEST CT ABDOMEN AND PELVIS WITH CONTRAST TECHNIQUE: Multidetector CT imaging of the chest was performed using the standard protocol during bolus administration of intravenous contrast. Multiplanar CT image reconstructions and MIPs were obtained to evaluate the vascular anatomy. Multidetector CT imaging of the abdomen and pelvis was performed using the standard protocol during bolus administration of intravenous contrast. CONTRAST:  35mL OMNIPAQUE IOHEXOL 350  MG/ML SOLN COMPARISON:  06/15/2019 FINDINGS: CTA CHEST FINDINGS Cardiovascular: This is a technically adequate evaluation of the pulmonary vasculature. No filling defects or pulmonary emboli. There is extensive calcification of the mitral annulus. No pericardial effusion. No evidence of thoracic aortic aneurysm or dissection. Continued atherosclerosis of the aorta and coronary vessels. Mediastinum/Nodes: No enlarged mediastinal, hilar, or axillary lymph nodes. Thyroid gland, trachea, and esophagus demonstrate no significant findings. Lungs/Pleura: Mosaic areas of ground-glass attenuation within the mid and lower lung zones, favor small airway disease and air trapping. There is mild bibasilar bronchial wall thickening. No effusion or pneumothorax. Musculoskeletal: No acute or destructive bony lesions. Reconstructed images demonstrate no additional findings. Review of the MIP images confirms the above findings. CT ABDOMEN and PELVIS FINDINGS Hepatobiliary: No focal liver abnormality is seen. No gallstones, gallbladder wall thickening, or biliary dilatation. Pancreas: Unremarkable. No pancreatic ductal dilatation or surrounding inflammatory changes. Spleen: Normal in size without focal abnormality. Adrenals/Urinary Tract: Stable peripelvic cyst lower pole right kidney. Otherwise the kidneys enhance normally and symmetrically. No urinary tract calculi or evidence of obstruction. Bladder is moderately distended without focal abnormality. The adrenals are normal. Stomach/Bowel: No bowel obstruction or ileus. Moderate stool throughout the colon. No bowel wall thickening or inflammatory change. Vascular/Lymphatic: Aortic atherosclerosis. No enlarged abdominal or pelvic lymph nodes. Reproductive: Status post hysterectomy. No adnexal masses. Other: No free fluid or free gas. No abdominal wall hernia. Musculoskeletal: No acute or destructive bony lesions. Prominent spondylosis at L2-3. Reconstructed images demonstrate no  additional findings. Review of the MIP images confirms the above findings. IMPRESSION: 1. No evidence of pulmonary embolus. 2. Bibasilar bronchial wall thickening consistent with bronchitis. Ground-glass attenuation at the bases consistent with small airway disease and air trapping. 3. No acute intra-abdominal or intrapelvic process. 4. Moderate fecal retention. 5. Aortic Atherosclerosis (ICD10-I70.0). Electronically Signed   By: Randa Ngo M.D.   On: 04/28/2020 21:09   CT ABDOMEN PELVIS W CONTRAST  Result Date: 04/28/2020  CLINICAL DATA:  Increasing confusing, weakness, elevated white blood cell count, lower abdominal pain EXAM: CT ANGIOGRAPHY CHEST CT ABDOMEN AND PELVIS WITH CONTRAST TECHNIQUE: Multidetector CT imaging of the chest was performed using the standard protocol during bolus administration of intravenous contrast. Multiplanar CT image reconstructions and MIPs were obtained to evaluate the vascular anatomy. Multidetector CT imaging of the abdomen and pelvis was performed using the standard protocol during bolus administration of intravenous contrast. CONTRAST:  44mL OMNIPAQUE IOHEXOL 350 MG/ML SOLN COMPARISON:  06/15/2019 FINDINGS: CTA CHEST FINDINGS Cardiovascular: This is a technically adequate evaluation of the pulmonary vasculature. No filling defects or pulmonary emboli. There is extensive calcification of the mitral annulus. No pericardial effusion. No evidence of thoracic aortic aneurysm or dissection. Continued atherosclerosis of the aorta and coronary vessels. Mediastinum/Nodes: No enlarged mediastinal, hilar, or axillary lymph nodes. Thyroid gland, trachea, and esophagus demonstrate no significant findings. Lungs/Pleura: Mosaic areas of ground-glass attenuation within the mid and lower lung zones, favor small airway disease and air trapping. There is mild bibasilar bronchial wall thickening. No effusion or pneumothorax. Musculoskeletal: No acute or destructive bony lesions. Reconstructed  images demonstrate no additional findings. Review of the MIP images confirms the above findings. CT ABDOMEN and PELVIS FINDINGS Hepatobiliary: No focal liver abnormality is seen. No gallstones, gallbladder wall thickening, or biliary dilatation. Pancreas: Unremarkable. No pancreatic ductal dilatation or surrounding inflammatory changes. Spleen: Normal in size without focal abnormality. Adrenals/Urinary Tract: Stable peripelvic cyst lower pole right kidney. Otherwise the kidneys enhance normally and symmetrically. No urinary tract calculi or evidence of obstruction. Bladder is moderately distended without focal abnormality. The adrenals are normal. Stomach/Bowel: No bowel obstruction or ileus. Moderate stool throughout the colon. No bowel wall thickening or inflammatory change. Vascular/Lymphatic: Aortic atherosclerosis. No enlarged abdominal or pelvic lymph nodes. Reproductive: Status post hysterectomy. No adnexal masses. Other: No free fluid or free gas. No abdominal wall hernia. Musculoskeletal: No acute or destructive bony lesions. Prominent spondylosis at L2-3. Reconstructed images demonstrate no additional findings. Review of the MIP images confirms the above findings. IMPRESSION: 1. No evidence of pulmonary embolus. 2. Bibasilar bronchial wall thickening consistent with bronchitis. Ground-glass attenuation at the bases consistent with small airway disease and air trapping. 3. No acute intra-abdominal or intrapelvic process. 4. Moderate fecal retention. 5. Aortic Atherosclerosis (ICD10-I70.0). Electronically Signed   By: Randa Ngo M.D.   On: 04/28/2020 21:09    ____________________________________________   PROCEDURES  Procedure(s) performed (including Critical Care):  .1-3 Lead EKG Interpretation Performed by: Vanessa Salvisa, MD Authorized by: Vanessa Silverton, MD     Interpretation: normal     ECG rate:  90s   ECG rate assessment: normal     Rhythm: sinus rhythm     Ectopy: none      Conduction: normal       ____________________________________________   INITIAL IMPRESSION / ASSESSMENT AND PLAN / ED COURSE  [name] was evaluated in Emergency Department on 04/28/2020 for the symptoms described in the history of present illness. She was evaluated in the context of the global COVID-19 pandemic, which necessitated consideration that the patient might be at risk for infection with the SARS-CoV-2 virus that causes COVID-19. Institutional protocols and algorithms that pertain to the evaluation of patients at risk for COVID-19 are in a state of rapid change based on information released by regulatory bodies including the CDC and federal and state organizations. These policies and algorithms were followed during the patient's care in the ED.  Patient is an 87 year old who comes in with increasing weakness and altered mental status in the setting of being on antibiotics.  Patient is very pleasant but does seem to be more tired than normal and more lethargic. Labs ordered evaluate for [place] abnormalities, AKI, thyroid dysfunction, UTI.  CT scan to evaluate for intercranial hemorrhage, CT PE to evaluate for pulmonary embolism and CT abdomen to evaluate for acute abdominal infection. Will keep on cardiac monitor to evluate for arrythmia. Mild swelling in legs- per family has lymphedema and sometimes legs swell up and turn slightly red. No obvious sign of cellulitis.    Labs thus far reassuring.  CT scans no acute findings but some stool  Re-evaluated and still very lethargic. Will d/w hospital admission given pt lives alone and not at baseline.        ____________________________________________   FINAL CLINICAL IMPRESSION(S) / ED DIAGNOSES   Final diagnoses:  Altered mental status, unspecified altered mental status type      MEDICATIONS GIVEN DURING THIS VISIT:  Medications  heparin injection 5,000 Units (has no administration in time range)  0.9 %  sodium  chloride infusion (has no administration in time range)  acetaminophen (TYLENOL) tablet 650 mg (has no administration in time range)    Or  acetaminophen (TYLENOL) suppository 650 mg (has no administration in time range)  ondansetron (ZOFRAN) tablet 4 mg (has no administration in time range)    Or  ondansetron (ZOFRAN) injection 4 mg (has no administration in time range)  albuterol (PROVENTIL) (2.5 MG/3ML) 0.083% nebulizer solution 2.5 mg (has no administration in time range)  albuterol (PROVENTIL) (2.5 MG/3ML) 0.083% nebulizer solution 2.5 mg (has no administration in time range)  allopurinol (ZYLOPRIM) tablet 100 mg (has no administration in time range)  traMADol (ULTRAM) tablet 50 mg (has no administration in time range)  DULoxetine (CYMBALTA) DR capsule 20 mg (has no administration in time range)  levothyroxine (SYNTHROID) tablet 100 mcg (has no administration in time range)  pantoprazole (PROTONIX) EC tablet 40 mg (has no administration in time range)  famotidine (PEPCID) tablet 40 mg (has no administration in time range)  oxybutynin (DITROPAN) tablet 5 mg (has no administration in time range)  rOPINIRole (REQUIP) tablet 2 mg (has no administration in time range)  azelastine (ASTELIN) 0.1 % nasal spray 1 spray (has no administration in time range)  ampicillin-sulbactam (UNASYN) 1.5 g in sodium chloride 0.9 % 100 mL IVPB (has no administration in time range)  iohexol (OMNIPAQUE) 350 MG/ML injection 75 mL (75 mLs Intravenous Contrast Given 04/28/20 2044)     ED Discharge Orders    None       Note:  This document was prepared using Dragon voice recognition software and may include unintentional dictation errors.   Vanessa Carmi, MD 04/28/20 2244

## 2020-04-28 NOTE — ED Notes (Signed)
Patient transported to MRI 

## 2020-04-29 ENCOUNTER — Inpatient Hospital Stay (HOSPITAL_COMMUNITY)
Admit: 2020-04-29 | Discharge: 2020-04-29 | Disposition: A | Discharge status: Home / Self Care | Payer: Medicare Other | Attending: Internal Medicine | Admitting: Internal Medicine

## 2020-04-29 DIAGNOSIS — I361 Nonrheumatic tricuspid (valve) insufficiency: Secondary | ICD-10-CM | POA: Diagnosis not present

## 2020-04-29 DIAGNOSIS — I351 Nonrheumatic aortic (valve) insufficiency: Secondary | ICD-10-CM

## 2020-04-29 DIAGNOSIS — R509 Fever, unspecified: Secondary | ICD-10-CM

## 2020-04-29 DIAGNOSIS — N1832 Chronic kidney disease, stage 3b: Secondary | ICD-10-CM

## 2020-04-29 DIAGNOSIS — J410 Simple chronic bronchitis: Secondary | ICD-10-CM

## 2020-04-29 LAB — ECHOCARDIOGRAM COMPLETE
AR max vel: 1.53 cm2
AV Peak grad: 14.6 mmHg
Ao pk vel: 1.91 m/s
Area-P 1/2: 2.74 cm2
Height: 61 in
S' Lateral: 3.03 cm
Weight: 2528 oz

## 2020-04-29 LAB — TROPONIN I (HIGH SENSITIVITY): Troponin I (High Sensitivity): 9 ng/L (ref <18)

## 2020-04-29 LAB — D-DIMER, QUANTITATIVE: D-Dimer, Quant: 1.7 ug/mL-FEU — ABNORMAL HIGH (ref 0.00–0.50)

## 2020-04-29 LAB — VITAMIN B12: Vitamin B-12: 713 pg/mL (ref 180–914)

## 2020-04-29 MED ORDER — POLYETHYLENE GLYCOL 3350 17 G PO PACK
17.0000 g | PACK | Freq: Every day | ORAL | Status: DC
  Administered 2020-04-29 – 2020-05-01 (×3): 17 g via ORAL
  Filled 2020-04-29 (×3): qty 1

## 2020-04-29 MED ORDER — POTASSIUM CHLORIDE 10 MEQ/100ML IV SOLN
10.0000 meq | INTRAVENOUS | Status: AC
  Administered 2020-04-29 (×2): 10 meq via INTRAVENOUS
  Filled 2020-04-29 (×2): qty 100

## 2020-04-29 MED ORDER — BISACODYL 10 MG RE SUPP
10.0000 mg | Freq: Once | RECTAL | Status: AC
  Administered 2020-04-29: 10 mg via RECTAL
  Filled 2020-04-29: qty 1

## 2020-04-29 MED ORDER — DM-GUAIFENESIN ER 30-600 MG PO TB12
1.0000 | ORAL_TABLET | Freq: Two times a day (BID) | ORAL | Status: DC
  Administered 2020-04-29 – 2020-05-01 (×4): 1 via ORAL
  Filled 2020-04-29 (×5): qty 1

## 2020-04-29 MED ORDER — LEVOTHYROXINE SODIUM 112 MCG PO TABS
112.0000 ug | ORAL_TABLET | Freq: Every morning | ORAL | Status: DC
  Administered 2020-04-30 – 2020-05-01 (×2): 112 ug via ORAL
  Filled 2020-04-29 (×3): qty 1

## 2020-04-29 MED ORDER — LIOTHYRONINE SODIUM 5 MCG PO TABS
5.0000 ug | ORAL_TABLET | Freq: Every day | ORAL | Status: DC
  Administered 2020-04-29 – 2020-05-01 (×3): 5 ug via ORAL
  Filled 2020-04-29 (×3): qty 1

## 2020-04-29 NOTE — Progress Notes (Signed)
*  PRELIMINARY RESULTS* Echocardiogram 2D Echocardiogram has been performed.  [name] 04/29/2020, 3:30 PM

## 2020-04-29 NOTE — ED Notes (Signed)
pts family updated regarding pt's room upstairs.

## 2020-04-29 NOTE — Progress Notes (Signed)
PHARMACY NOTE:  ANTIMICROBIAL RENAL DOSAGE ADJUSTMENT  Current antimicrobial regimen includes a mismatch between antimicrobial dosage and estimated renal function.  As per policy approved by the Pharmacy & Therapeutics and Medical Executive Committees, the antimicrobial dosage will be adjusted accordingly.  Current antimicrobial dosage:  Unasyn 1.5 gm IV Q6H   Indication:  UTI   Renal Function:  Estimated Creatinine Clearance: 27.4 mL/min (A) (by C-G formula based on SCr of 1.36 mg/dL (H)). []      On intermittent HD, scheduled: []      On CRRT    Antimicrobial dosage has been changed to:  Unasyn 1.5 gm IV Q12H  Additional comments:   Thank you for allowing pharmacy to be a part of this patient's care.  Elic Vencill D, [facility] 04/29/2020 1:41 AM

## 2020-04-29 NOTE — Progress Notes (Signed)
PROGRESS NOTE    [name]  TFT:[id number] DOB: August 07, 1932 DOA: 04/28/2020 PCP: Tracie Harrier, MD    Brief Narrative:  Patient is a 87 year old female who has history of COPD and chronic bronchitis, GERD, hypothyroidism, hypertension, chronic diastolic heart failure and chronic kidney disease stage III AAA who was brought from home by her daughter with lethargy and excessive sleepiness.  She was seen recently at PCP office and found to have high white count and is started on cefdinir.  Her daughter went to visit her and found her very sleepy and difficult to keep her awake so brought to ER. Patient was complaining of just feeling tired.  She does have worsening congestion and dry cough.   Assessment & Plan:   Principal Problem:   AMS (altered mental status) Active Problems:   HLD (hyperlipidemia)   Chronic kidney disease (CKD), stage III (moderate) (HCC)   Chronic obstructive pulmonary disease (HCC)   Gastro-esophageal reflux disease without esophagitis   Hypothyroidism   Hypertension   Coronary artery disease   Congestive heart failure (CHF) (HCC)  Somnolence/lethargy: Patient probably has baseline cognitive dysfunction and developed somnolence and lethargy due to ongoing dehydration, bacterial bronchitis. Extensive work-up including MRI of the brain with some encephalomalacia, no acute findings. CTA of the chest with bronchitis changes, no PE. CT scan abdomen pelvis with no abnormalities. Will check B1 and B12 level and replace if abnormal. Supportive treatment. PT OT.  Patient may need more supervised living condition.  COPD with chronic bronchitis with exacerbation: Patient with cough and wheezing and worsening congestion. Reasonable to try 1 week of antibiotics, currently on Unasyn in the hospital.  Bronchodilator therapy and chest physiotherapy.  Hypothyroidism: On Synthroid 112 mcg.  Dose was increased 2 months ago. TSH is 1.44, T4 is 1.76.  Appropriately  corrected.  No indication to alter therapies.  Moderate dehydration: Encourage oral intake.  Continue maintenance IV fluid today.  GERD: On PPI.   DVT prophylaxis: heparin injection 5,000 Units Start: 04/28/20 2215 SCDs Start: 04/28/20 2215   Code Status: Full code Family Communication: Daughter at bedside Disposition Plan: Status is: Inpatient  Remains inpatient appropriate because:Altered mental status, IV treatments appropriate due to intensity of illness or inability to take PO and Inpatient level of care appropriate due to severity of illness   Dispo: The patient is from: Home              Anticipated d/c is to: Home              Patient currently is not medically stable to d/c.   Difficult to place patient No         Consultants:   None  Procedures:   None  Antimicrobials:   Unasyn, 3/4----   Subjective: Patient was seen and examined.  Her daughter was at the bedside.  Patient stated not having bowel movement for many days now.  Denies any nausea vomiting.  Has cough but no sputum.  Afebrile.  She falls asleep in between conversation.  Objective: Vitals:   04/29/20 0035 04/29/20 0130 04/29/20 0216 04/29/20 0903  BP: (!) 102/54 (!) 99/43 (!) 113/59 (!) 101/54  Pulse: 71 85 74 66  Resp: 18 (!) 24 18 16   Temp:   98.5 F (36.9 C) (!) 97.5 F (36.4 C)  TempSrc:   Oral Oral  SpO2: 100% 93% 99% 94%  Weight:      Height:        Intake/Output Summary (Last 24 hours)  at 04/29/2020 1325 Last data filed at 04/29/2020 1027 Gross per 24 hour  Intake 220 ml  Output --  Net 220 ml   Filed Weights   04/28/20 1657  Weight: 71.7 kg    Examination:  General exam: Appears calm but lethargic and sleepy. Chronically sick looking.  Not in any distress. Respiratory system: Patient has bilateral inspiratory and expiratory crackles, conducted airway sounds and some expiratory wheezes. Cardiovascular system: S1 & S2 heard, RRR. No JVD, murmurs, rubs, gallops or  clicks. Trace bilateral edema.  Shiny skin.  Gastrointestinal system: Abdomen is nondistended, soft and nontender. No organomegaly or masses felt. Normal bowel sounds heard. Central nervous system: Alert and oriented x3.  No focal neurological deficits. Extremities: Symmetric 5 x 5 power.  Generalized weakness.    Data Reviewed: I have personally reviewed following labs and imaging studies  CBC: Recent Labs  Lab 04/28/20 1659  WBC 9.8  NEUTROABS 8.6*  HGB 11.7*  HCT 34.7*  MCV 90.1  PLT 387   Basic Metabolic Panel: Recent Labs  Lab 04/28/20 1659 04/28/20 1906  NA 132*  --   K 3.2*  --   CL 94*  --   CO2 27  --   GLUCOSE 129*  --   BUN 34*  --   CREATININE 1.36*  --   CALCIUM 8.5*  --   MG  --  2.3   GFR: Estimated Creatinine Clearance: 27.4 mL/min (A) (by C-G formula based on SCr of 1.36 mg/dL (H)). Liver Function Tests: Recent Labs  Lab 04/28/20 1659  AST 31  ALT 21  ALKPHOS 55  BILITOT 1.5*  PROT 6.8  ALBUMIN 3.6   No results for input(s): LIPASE, AMYLASE in the last 168 hours. No results for input(s): AMMONIA in the last 168 hours. Coagulation Profile: No results for input(s): INR, PROTIME in the last 168 hours. Cardiac Enzymes: No results for input(s): CKTOTAL, CKMB, CKMBINDEX, TROPONINI in the last 168 hours. BNP (last 3 results) No results for input(s): PROBNP in the last 8760 hours. HbA1C: No results for input(s): HGBA1C in the last 72 hours. CBG: Recent Labs  Lab 04/28/20 1706  GLUCAP 128*   Lipid Profile: No results for input(s): CHOL, HDL, LDLCALC, TRIG, CHOLHDL, LDLDIRECT in the last 72 hours. Thyroid Function Tests: Recent Labs    04/28/20 1659  TSH 1.447  FREET4 1.76*   Anemia Panel: No results for input(s): VITAMINB12, FOLATE, FERRITIN, TIBC, IRON, RETICCTPCT in the last 72 hours. Sepsis Labs: No results for input(s): PROCALCITON, LATICACIDVEN in the last 168 hours.  Recent Results (from the past 240 hour(s))  Resp Panel by  RT-PCR (Flu A&B, Covid) Nasopharyngeal Swab     Status: None   Collection Time: 04/28/20  7:11 PM   Specimen: Nasopharyngeal Swab; Nasopharyngeal(NP) swabs in vial transport medium  Result Value Ref Range Status   SARS Coronavirus 2 by RT PCR NEGATIVE NEGATIVE Final    Comment: (NOTE) SARS-CoV-2 target nucleic acids are NOT DETECTED.  The SARS-CoV-2 RNA is generally detectable in upper respiratory specimens during the acute phase of infection. The lowest concentration of SARS-CoV-2 viral copies this assay can detect is 138 copies/mL. A negative result does not preclude SARS-Cov-2 infection and should not be used as the sole basis for treatment or other patient management decisions. A negative result may occur with  improper specimen collection/handling, submission of specimen other than nasopharyngeal swab, presence of viral mutation(s) within the areas targeted by this assay, and inadequate number of viral  copies(<138 copies/mL). A negative result must be combined with clinical observations, patient history, and epidemiological information. The expected result is Negative.  Fact Sheet for Patients:  EntrepreneurPulse.com.au  Fact Sheet for Healthcare Providers:  IncredibleEmployment.be  This test is no t yet approved or cleared by the [place] FDA and  has been authorized for detection and/or diagnosis of SARS-CoV-2 by FDA under an Emergency Use Authorization (EUA). This EUA will remain  in effect (meaning this test can be used) for the duration of the COVID-19 declaration under Section 564(b)(1) of the Act, 21 U.S.C.section 360bbb-3(b)(1), unless the authorization is terminated  or revoked sooner.       Influenza A by PCR NEGATIVE NEGATIVE Final   Influenza B by PCR NEGATIVE NEGATIVE Final    Comment: (NOTE) The Xpert Xpress SARS-CoV-2/FLU/RSV plus assay is intended as an aid in the diagnosis of influenza from Nasopharyngeal swab  specimens and should not be used as a sole basis for treatment. Nasal washings and aspirates are unacceptable for Xpert Xpress SARS-CoV-2/FLU/RSV testing.  Fact Sheet for Patients: EntrepreneurPulse.com.au  Fact Sheet for Healthcare Providers: IncredibleEmployment.be  This test is not yet approved or cleared by the [place] FDA and has been authorized for detection and/or diagnosis of SARS-CoV-2 by FDA under an Emergency Use Authorization (EUA). This EUA will remain in effect (meaning this test can be used) for the duration of the COVID-19 declaration under Section 564(b)(1) of the Act, 21 U.S.C. section 360bbb-3(b)(1), unless the authorization is terminated or revoked.  Performed at [facility], [address], [place], [place] [address]          Radiology Studies: CT Head Wo Contrast  Result Date: 04/28/2020 CLINICAL DATA:  Confusion, weakness, dizziness EXAM: CT HEAD WITHOUT CONTRAST TECHNIQUE: Contiguous axial images were obtained from the base of the skull through the vertex without intravenous contrast. COMPARISON:  02/15/2020 FINDINGS: Brain: No acute infarct or hemorrhage. Lateral ventricles and midline structures are unremarkable. No acute extra-axial fluid collections. No mass effect. Vascular: No hyperdense vessel or unexpected calcification. Skull: Normal. Negative for fracture or focal lesion. Sinuses/Orbits: No acute finding. Other: None. IMPRESSION: 1. Stable head CT, no acute intracranial process. Electronically Signed   By: Randa Ngo M.D.   On: 04/28/2020 20:59   CT Angio Chest PE W and/or Wo Contrast  Result Date: 04/28/2020 CLINICAL DATA:  Increasing confusing, weakness, elevated white blood cell count, lower abdominal pain EXAM: CT ANGIOGRAPHY CHEST CT ABDOMEN AND PELVIS WITH CONTRAST TECHNIQUE: Multidetector CT imaging of the chest was performed using the standard protocol during bolus administration of  intravenous contrast. Multiplanar CT image reconstructions and MIPs were obtained to evaluate the vascular anatomy. Multidetector CT imaging of the abdomen and pelvis was performed using the standard protocol during bolus administration of intravenous contrast. CONTRAST:  65mL OMNIPAQUE IOHEXOL 350 MG/ML SOLN COMPARISON:  06/15/2019 FINDINGS: CTA CHEST FINDINGS Cardiovascular: This is a technically adequate evaluation of the pulmonary vasculature. No filling defects or pulmonary emboli. There is extensive calcification of the mitral annulus. No pericardial effusion. No evidence of thoracic aortic aneurysm or dissection. Continued atherosclerosis of the aorta and coronary vessels. Mediastinum/Nodes: No enlarged mediastinal, hilar, or axillary lymph nodes. Thyroid gland, trachea, and esophagus demonstrate no significant findings. Lungs/Pleura: Mosaic areas of ground-glass attenuation within the mid and lower lung zones, favor small airway disease and air trapping. There is mild bibasilar bronchial wall thickening. No effusion or pneumothorax. Musculoskeletal: No acute or destructive bony lesions. Reconstructed images demonstrate no additional  findings. Review of the MIP images confirms the above findings. CT ABDOMEN and PELVIS FINDINGS Hepatobiliary: No focal liver abnormality is seen. No gallstones, gallbladder wall thickening, or biliary dilatation. Pancreas: Unremarkable. No pancreatic ductal dilatation or surrounding inflammatory changes. Spleen: Normal in size without focal abnormality. Adrenals/Urinary Tract: Stable peripelvic cyst lower pole right kidney. Otherwise the kidneys enhance normally and symmetrically. No urinary tract calculi or evidence of obstruction. Bladder is moderately distended without focal abnormality. The adrenals are normal. Stomach/Bowel: No bowel obstruction or ileus. Moderate stool throughout the colon. No bowel wall thickening or inflammatory change. Vascular/Lymphatic: Aortic  atherosclerosis. No enlarged abdominal or pelvic lymph nodes. Reproductive: Status post hysterectomy. No adnexal masses. Other: No free fluid or free gas. No abdominal wall hernia. Musculoskeletal: No acute or destructive bony lesions. Prominent spondylosis at L2-3. Reconstructed images demonstrate no additional findings. Review of the MIP images confirms the above findings. IMPRESSION: 1. No evidence of pulmonary embolus. 2. Bibasilar bronchial wall thickening consistent with bronchitis. Ground-glass attenuation at the bases consistent with small airway disease and air trapping. 3. No acute intra-abdominal or intrapelvic process. 4. Moderate fecal retention. 5. Aortic Atherosclerosis (ICD10-I70.0). Electronically Signed   By: Randa Ngo M.D.   On: 04/28/2020 21:09   MR BRAIN WO CONTRAST  Result Date: 04/29/2020 CLINICAL DATA:  Initial evaluation for acute delirium, mental status change. EXAM: MRI HEAD WITHOUT CONTRAST TECHNIQUE: Multiplanar, multiecho pulse sequences of the brain and surrounding structures were obtained without intravenous contrast. COMPARISON:  Prior CT from earlier the same day. FINDINGS: Brain: Diffuse prominence of the CSF containing spaces compatible generalized age-related cerebral atrophy. Minimal chronic microvascular ischemic disease for age. Small focus of encephalomalacia noted at the peripheral left cerebellum, stable from previous. No abnormal foci of restricted diffusion to suggest acute or subacute ischemia or other abnormality. Gray-white matter differentiation well maintained. No other areas of encephalomalacia to suggest chronic cortical infarction or other insult. No foci of susceptibility artifact to suggest acute or chronic intracranial hemorrhage. No mass lesion, midline shift or mass effect. No hydrocephalus or extra-axial fluid collection. Pituitary gland suprasellar region within normal limits. Midline structures intact. Vascular: Major intracranial vascular flow  voids are maintained. Skull and upper cervical spine: Craniocervical junction within normal limits. Bone marrow signal intensity normal. No scalp soft tissue abnormality. Sinuses/Orbits: Globes and orbital soft tissues within normal limits. Mild scattered mucosal thickening noted within the ethmoidal air cells. Paranasal sinuses are otherwise clear. No mastoid effusion. Inner ear structures grossly normal. Other: None. IMPRESSION: 1. No acute intracranial abnormality. 2. Small focus of encephalomalacia at the peripheral left cerebellum, stable from previous. 3. Age-related cerebral atrophy with mild chronic small vessel ischemic disease. Electronically Signed   By: Jeannine Boga M.D.   On: 04/29/2020 00:30   CT ABDOMEN PELVIS W CONTRAST  Result Date: 04/28/2020 CLINICAL DATA:  Increasing confusing, weakness, elevated white blood cell count, lower abdominal pain EXAM: CT ANGIOGRAPHY CHEST CT ABDOMEN AND PELVIS WITH CONTRAST TECHNIQUE: Multidetector CT imaging of the chest was performed using the standard protocol during bolus administration of intravenous contrast. Multiplanar CT image reconstructions and MIPs were obtained to evaluate the vascular anatomy. Multidetector CT imaging of the abdomen and pelvis was performed using the standard protocol during bolus administration of intravenous contrast. CONTRAST:  90mL OMNIPAQUE IOHEXOL 350 MG/ML SOLN COMPARISON:  06/15/2019 FINDINGS: CTA CHEST FINDINGS Cardiovascular: This is a technically adequate evaluation of the pulmonary vasculature. No filling defects or pulmonary emboli. There is extensive calcification of the mitral annulus.  No pericardial effusion. No evidence of thoracic aortic aneurysm or dissection. Continued atherosclerosis of the aorta and coronary vessels. Mediastinum/Nodes: No enlarged mediastinal, hilar, or axillary lymph nodes. Thyroid gland, trachea, and esophagus demonstrate no significant findings. Lungs/Pleura: Mosaic areas of  ground-glass attenuation within the mid and lower lung zones, favor small airway disease and air trapping. There is mild bibasilar bronchial wall thickening. No effusion or pneumothorax. Musculoskeletal: No acute or destructive bony lesions. Reconstructed images demonstrate no additional findings. Review of the MIP images confirms the above findings. CT ABDOMEN and PELVIS FINDINGS Hepatobiliary: No focal liver abnormality is seen. No gallstones, gallbladder wall thickening, or biliary dilatation. Pancreas: Unremarkable. No pancreatic ductal dilatation or surrounding inflammatory changes. Spleen: Normal in size without focal abnormality. Adrenals/Urinary Tract: Stable peripelvic cyst lower pole right kidney. Otherwise the kidneys enhance normally and symmetrically. No urinary tract calculi or evidence of obstruction. Bladder is moderately distended without focal abnormality. The adrenals are normal. Stomach/Bowel: No bowel obstruction or ileus. Moderate stool throughout the colon. No bowel wall thickening or inflammatory change. Vascular/Lymphatic: Aortic atherosclerosis. No enlarged abdominal or pelvic lymph nodes. Reproductive: Status post hysterectomy. No adnexal masses. Other: No free fluid or free gas. No abdominal wall hernia. Musculoskeletal: No acute or destructive bony lesions. Prominent spondylosis at L2-3. Reconstructed images demonstrate no additional findings. Review of the MIP images confirms the above findings. IMPRESSION: 1. No evidence of pulmonary embolus. 2. Bibasilar bronchial wall thickening consistent with bronchitis. Ground-glass attenuation at the bases consistent with small airway disease and air trapping. 3. No acute intra-abdominal or intrapelvic process. 4. Moderate fecal retention. 5. Aortic Atherosclerosis (ICD10-I70.0). Electronically Signed   By: Randa Ngo M.D.   On: 04/28/2020 21:09        Scheduled Meds: . DULoxetine  20 mg Oral Daily  . famotidine  40 mg Oral QHS  .  heparin  5,000 Units Subcutaneous Q8H  . [START ON 04/30/2020] levothyroxine  112 mcg Oral q morning  . liothyronine  5 mcg Oral Daily  . oxybutynin  5 mg Oral TID  . pantoprazole  40 mg Oral Daily  . polyethylene glycol  17 g Oral Daily  . rOPINIRole  2 mg Oral BID   Continuous Infusions: . sodium chloride 50 mL/hr at 04/29/20 0049  . ampicillin-sulbactam (UNASYN) IV 1.5 g (04/29/20 0441)     LOS: 1 day    Time spent: 30 minutes    Barb Merino, MD [facility] Pager [phone]

## 2020-04-30 LAB — CBC WITH DIFFERENTIAL/PLATELET
Abs Immature Granulocytes: 0.02 10*3/uL (ref 0.00–0.07)
Basophils Absolute: 0 10*3/uL (ref 0.0–0.1)
Basophils Relative: 0 %
Eosinophils Absolute: 0.3 10*3/uL (ref 0.0–0.5)
Eosinophils Relative: 5 %
HCT: 32.6 % — ABNORMAL LOW (ref 36.0–46.0)
Hemoglobin: 10.9 g/dL — ABNORMAL LOW (ref 12.0–15.0)
Immature Granulocytes: 0 %
Lymphocytes Relative: 21 %
Lymphs Abs: 1.1 10*3/uL (ref 0.7–4.0)
MCH: 30.6 pg (ref 26.0–34.0)
MCHC: 33.4 g/dL (ref 30.0–36.0)
MCV: 91.6 fL (ref 80.0–100.0)
Monocytes Absolute: 0.6 10*3/uL (ref 0.1–1.0)
Monocytes Relative: 11 %
Neutro Abs: 3.2 10*3/uL (ref 1.7–7.7)
Neutrophils Relative %: 63 %
Platelets: 242 10*3/uL (ref 150–400)
RBC: 3.56 MIL/uL — ABNORMAL LOW (ref 3.87–5.11)
RDW: 14.4 % (ref 11.5–15.5)
WBC: 5.1 10*3/uL (ref 4.0–10.5)
nRBC: 0 % (ref 0.0–0.2)

## 2020-04-30 LAB — COMPREHENSIVE METABOLIC PANEL
ALT: 17 U/L (ref 0–44)
AST: 20 U/L (ref 15–41)
Albumin: 2.7 g/dL — ABNORMAL LOW (ref 3.5–5.0)
Alkaline Phosphatase: 44 U/L (ref 38–126)
Anion gap: 9 (ref 5–15)
BUN: 27 mg/dL — ABNORMAL HIGH (ref 8–23)
CO2: 27 mmol/L (ref 22–32)
Calcium: 8.1 mg/dL — ABNORMAL LOW (ref 8.9–10.3)
Chloride: 101 mmol/L (ref 98–111)
Creatinine, Ser: 1.12 mg/dL — ABNORMAL HIGH (ref 0.44–1.00)
GFR, Estimated: 48 mL/min — ABNORMAL LOW (ref >60)
Glucose, Bld: 87 mg/dL (ref 70–99)
Potassium: 3.3 mmol/L — ABNORMAL LOW (ref 3.5–5.1)
Sodium: 137 mmol/L (ref 135–145)
Total Bilirubin: 0.8 mg/dL (ref 0.3–1.2)
Total Protein: 5.7 g/dL — ABNORMAL LOW (ref 6.5–8.1)

## 2020-04-30 LAB — PHOSPHORUS: Phosphorus: 3.3 mg/dL (ref 2.5–4.6)

## 2020-04-30 LAB — MAGNESIUM: Magnesium: 2.2 mg/dL (ref 1.7–2.4)

## 2020-04-30 MED ORDER — POTASSIUM CHLORIDE CRYS ER 20 MEQ PO TBCR
20.0000 meq | EXTENDED_RELEASE_TABLET | Freq: Two times a day (BID) | ORAL | Status: DC
  Administered 2020-04-30 – 2020-05-01 (×3): 20 meq via ORAL
  Filled 2020-04-30 (×3): qty 1

## 2020-04-30 NOTE — Progress Notes (Signed)
PROGRESS NOTE    [name]  QHU:[id number] DOB: [birth year] DOA: 04/28/2020 PCP: Tracie Harrier, MD    Brief Narrative:  Patient is a 87 year old female who has history of COPD and chronic bronchitis, GERD, hypothyroidism, hypertension, chronic diastolic heart failure and chronic kidney disease stage III AAA who was brought from home by her daughter with lethargy and excessive sleepiness.  She was seen recently at PCP office and found to have high white count and is started on cefdinir.  Her daughter went to visit her and found her very sleepy and difficult to keep her awake so brought to ER. Patient was complaining of just feeling tired.  She does have worsening congestion and dry cough.   Assessment & Plan:   Principal Problem:   AMS (altered mental status) Active Problems:   HLD (hyperlipidemia)   Chronic kidney disease (CKD), stage III (moderate) (HCC)   Chronic obstructive pulmonary disease (HCC)   Gastro-esophageal reflux disease without esophagitis   Hypothyroidism   Hypertension   Coronary artery disease   Congestive heart failure (CHF) (HCC)  Somnolence/lethargy: Patient probably has baseline cognitive dysfunction and developed somnolence and lethargy due to ongoing dehydration, bacterial bronchitis. Extensive work-up including MRI of the brain with some encephalomalacia, no acute findings. CTA of the chest with bronchitis changes, no PE. CT scan abdomen pelvis with no abnormalities. B12 is adequate.  B1 levels pending. Supportive treatment. PT OT.  Patient may need more supervised living condition.  COPD with chronic bronchitis with exacerbation: Patient with cough and wheezing and worsening congestion. Reasonable to try 1 week of antibiotics, currently on Unasyn in the hospital.  Bronchodilator therapy and chest physiotherapy.  Hypothyroidism: On Synthroid 112 mcg.  Dose was increased 2 months ago. TSH is 1.44, T4 is 1.76.  Appropriately corrected.  No  indication to alter therapies.  Moderate dehydration: Encourage oral intake.  Continue maintenance IV fluid today.  Replace potassium.  Magnesium is adequate.  GERD: On PPI.  Patient clinically stabilizing.  Work with PT OT today to make discharge planning. Also discussed with patient and daughter at the bedside that she may need more assisted type of living and patient is agreeable.   DVT prophylaxis: heparin injection 5,000 Units Start: 04/28/20 2215 SCDs Start: 04/28/20 2215   Code Status: Full code Family Communication: Daughter at bedside Disposition Plan: Status is: Inpatient  Remains inpatient appropriate because:Altered mental status, IV treatments appropriate due to intensity of illness or inability to take PO and Inpatient level of care appropriate due to severity of illness   Dispo: The patient is from: Home              Anticipated d/c is to: Home with home health PT OT.              Patient currently is medically stable.   Difficult to place patient No         Consultants:   None  Procedures:   None  Antimicrobials:   Unasyn, 3/4----   Subjective: Patient seen and examined.  She wants to go home.  Denies any complaints.  Breathing better today.  She has not mobilized yet.  Afebrile.  Patient has some pain on her right shoulder with difficulty mobilizing.  Objective: Vitals:   04/29/20 2049 04/29/20 2322 04/30/20 0336 04/30/20 0812  BP: 106/62 (!) 107/56 (!) 110/59 99/69  Pulse: 68 71 63 80  Resp: 20 14 18 20   Temp: 98.8 F (37.1 C) 98.2 F (36.8 C) 98.4  F (36.9 C) 98.1 F (36.7 C)  TempSrc: Oral Oral Oral Oral  SpO2: 97% 98% 96% 94%  Weight:      Height:        Intake/Output Summary (Last 24 hours) at 04/30/2020 1132 Last data filed at 04/30/2020 0747 Gross per 24 hour  Intake 1059.17 ml  Output -  Net 1059.17 ml   Filed Weights   04/28/20 1657  Weight: 71.7 kg    Examination:  General: Patient looks fairly comfortable today on  room air.  She is able to have conversation.  She is alert oriented x4.  Looks frail and debilitated but not in any distress. Cardiovascular: S1-S2 normal.  No added sounds. Respiratory: Bilateral clear.  No added sounds. Gastrointestinal: Soft and nontender. Ext: No cyanosis or edema. Neuro: Alert oriented x4.  No focal deficits.     Data Reviewed: I have personally reviewed following labs and imaging studies  CBC: Recent Labs  Lab 04/28/20 1659 04/30/20 0523  WBC 9.8 5.1  NEUTROABS 8.6* 3.2  HGB 11.7* 10.9*  HCT 34.7* 32.6*  MCV 90.1 91.6  PLT 245 517   Basic Metabolic Panel: Recent Labs  Lab 04/28/20 1659 04/28/20 1906 04/30/20 0523  NA 132*  --  137  K 3.2*  --  3.3*  CL 94*  --  101  CO2 27  --  27  GLUCOSE 129*  --  87  BUN 34*  --  27*  CREATININE 1.36*  --  1.12*  CALCIUM 8.5*  --  8.1*  MG  --  2.3 2.2  PHOS  --   --  3.3   GFR: Estimated Creatinine Clearance: 33.3 mL/min (A) (by C-G formula based on SCr of 1.12 mg/dL (H)). Liver Function Tests: Recent Labs  Lab 04/28/20 1659 04/30/20 0523  AST 31 20  ALT 21 17  ALKPHOS 55 44  BILITOT 1.5* 0.8  PROT 6.8 5.7*  ALBUMIN 3.6 2.7*   No results for input(s): LIPASE, AMYLASE in the last 168 hours. No results for input(s): AMMONIA in the last 168 hours. Coagulation Profile: No results for input(s): INR, PROTIME in the last 168 hours. Cardiac Enzymes: No results for input(s): CKTOTAL, CKMB, CKMBINDEX, TROPONINI in the last 168 hours. BNP (last 3 results) No results for input(s): PROBNP in the last 8760 hours. HbA1C: No results for input(s): HGBA1C in the last 72 hours. CBG: Recent Labs  Lab 04/28/20 1706  GLUCAP 128*   Lipid Profile: No results for input(s): CHOL, HDL, LDLCALC, TRIG, CHOLHDL, LDLDIRECT in the last 72 hours. Thyroid Function Tests: Recent Labs    04/28/20 1659  TSH 1.447  FREET4 1.76*   Anemia Panel: Recent Labs    04/29/20 1031  VITAMINB12 713   Sepsis Labs: No  results for input(s): PROCALCITON, LATICACIDVEN in the last 168 hours.  Recent Results (from the past 240 hour(s))  Resp Panel by RT-PCR (Flu A&B, Covid) Nasopharyngeal Swab     Status: None   Collection Time: 04/28/20  7:11 PM   Specimen: Nasopharyngeal Swab; Nasopharyngeal(NP) swabs in vial transport medium  Result Value Ref Range Status   SARS Coronavirus 2 by RT PCR NEGATIVE NEGATIVE Final    Comment: (NOTE) SARS-CoV-2 target nucleic acids are NOT DETECTED.  The SARS-CoV-2 RNA is generally detectable in upper respiratory specimens during the acute phase of infection. The lowest concentration of SARS-CoV-2 viral copies this assay can detect is 138 copies/mL. A negative result does not preclude SARS-Cov-2 infection and should not be  used as the sole basis for treatment or other patient management decisions. A negative result may occur with  improper specimen collection/handling, submission of specimen other than nasopharyngeal swab, presence of viral mutation(s) within the areas targeted by this assay, and inadequate number of viral copies(<138 copies/mL). A negative result must be combined with clinical observations, patient history, and epidemiological information. The expected result is Negative.  Fact Sheet for Patients:  EntrepreneurPulse.com.au  Fact Sheet for Healthcare Providers:  IncredibleEmployment.be  This test is no t yet approved or cleared by the [place] FDA and  has been authorized for detection and/or diagnosis of SARS-CoV-2 by FDA under an Emergency Use Authorization (EUA). This EUA will remain  in effect (meaning this test can be used) for the duration of the COVID-19 declaration under Section 564(b)(1) of the Act, 21 U.S.C.section 360bbb-3(b)(1), unless the authorization is terminated  or revoked sooner.       Influenza A by PCR NEGATIVE NEGATIVE Final   Influenza B by PCR NEGATIVE NEGATIVE Final    Comment:  (NOTE) The Xpert Xpress SARS-CoV-2/FLU/RSV plus assay is intended as an aid in the diagnosis of influenza from Nasopharyngeal swab specimens and should not be used as a sole basis for treatment. Nasal washings and aspirates are unacceptable for Xpert Xpress SARS-CoV-2/FLU/RSV testing.  Fact Sheet for Patients: EntrepreneurPulse.com.au  Fact Sheet for Healthcare Providers: IncredibleEmployment.be  This test is not yet approved or cleared by the [place] FDA and has been authorized for detection and/or diagnosis of SARS-CoV-2 by FDA under an Emergency Use Authorization (EUA). This EUA will remain in effect (meaning this test can be used) for the duration of the COVID-19 declaration under Section 564(b)(1) of the Act, 21 U.S.C. section 360bbb-3(b)(1), unless the authorization is terminated or revoked.  Performed at [facility], [address] [place], [place] [address]          Radiology Studies: CT Head Wo Contrast  Result Date: 04/28/2020 CLINICAL DATA:  Confusion, weakness, dizziness EXAM: CT HEAD WITHOUT CONTRAST TECHNIQUE: Contiguous axial images were obtained from the base of the skull through the vertex without intravenous contrast. COMPARISON:  02/15/2020 FINDINGS: Brain: No acute infarct or hemorrhage. Lateral ventricles and midline structures are unremarkable. No acute extra-axial fluid collections. No mass effect. Vascular: No hyperdense vessel or unexpected calcification. Skull: Normal. Negative for fracture or focal lesion. Sinuses/Orbits: No acute finding. Other: None. IMPRESSION: 1. Stable head CT, no acute intracranial process. Electronically Signed   By: Randa Ngo M.D.   On: 04/28/2020 20:59   CT Angio Chest PE W and/or Wo Contrast  Result Date: 04/28/2020 CLINICAL DATA:  Increasing confusing, weakness, elevated white blood cell count, lower abdominal pain EXAM: CT ANGIOGRAPHY CHEST CT ABDOMEN AND PELVIS WITH  CONTRAST TECHNIQUE: Multidetector CT imaging of the chest was performed using the standard protocol during bolus administration of intravenous contrast. Multiplanar CT image reconstructions and MIPs were obtained to evaluate the vascular anatomy. Multidetector CT imaging of the abdomen and pelvis was performed using the standard protocol during bolus administration of intravenous contrast. CONTRAST:  23mL OMNIPAQUE IOHEXOL 350 MG/ML SOLN COMPARISON:  06/15/2019 FINDINGS: CTA CHEST FINDINGS Cardiovascular: This is a technically adequate evaluation of the pulmonary vasculature. No filling defects or pulmonary emboli. There is extensive calcification of the mitral annulus. No pericardial effusion. No evidence of thoracic aortic aneurysm or dissection. Continued atherosclerosis of the aorta and coronary vessels. Mediastinum/Nodes: No enlarged mediastinal, hilar, or axillary lymph nodes. Thyroid gland, trachea, and esophagus demonstrate no  significant findings. Lungs/Pleura: Mosaic areas of ground-glass attenuation within the mid and lower lung zones, favor small airway disease and air trapping. There is mild bibasilar bronchial wall thickening. No effusion or pneumothorax. Musculoskeletal: No acute or destructive bony lesions. Reconstructed images demonstrate no additional findings. Review of the MIP images confirms the above findings. CT ABDOMEN and PELVIS FINDINGS Hepatobiliary: No focal liver abnormality is seen. No gallstones, gallbladder wall thickening, or biliary dilatation. Pancreas: Unremarkable. No pancreatic ductal dilatation or surrounding inflammatory changes. Spleen: Normal in size without focal abnormality. Adrenals/Urinary Tract: Stable peripelvic cyst lower pole right kidney. Otherwise the kidneys enhance normally and symmetrically. No urinary tract calculi or evidence of obstruction. Bladder is moderately distended without focal abnormality. The adrenals are normal. Stomach/Bowel: No bowel obstruction  or ileus. Moderate stool throughout the colon. No bowel wall thickening or inflammatory change. Vascular/Lymphatic: Aortic atherosclerosis. No enlarged abdominal or pelvic lymph nodes. Reproductive: Status post hysterectomy. No adnexal masses. Other: No free fluid or free gas. No abdominal wall hernia. Musculoskeletal: No acute or destructive bony lesions. Prominent spondylosis at L2-3. Reconstructed images demonstrate no additional findings. Review of the MIP images confirms the above findings. IMPRESSION: 1. No evidence of pulmonary embolus. 2. Bibasilar bronchial wall thickening consistent with bronchitis. Ground-glass attenuation at the bases consistent with small airway disease and air trapping. 3. No acute intra-abdominal or intrapelvic process. 4. Moderate fecal retention. 5. Aortic Atherosclerosis (ICD10-I70.0). Electronically Signed   By: Randa Ngo M.D.   On: 04/28/2020 21:09   MR BRAIN WO CONTRAST  Result Date: 04/29/2020 CLINICAL DATA:  Initial evaluation for acute delirium, mental status change. EXAM: MRI HEAD WITHOUT CONTRAST TECHNIQUE: Multiplanar, multiecho pulse sequences of the brain and surrounding structures were obtained without intravenous contrast. COMPARISON:  Prior CT from earlier the same day. FINDINGS: Brain: Diffuse prominence of the CSF containing spaces compatible generalized age-related cerebral atrophy. Minimal chronic microvascular ischemic disease for age. Small focus of encephalomalacia noted at the peripheral left cerebellum, stable from previous. No abnormal foci of restricted diffusion to suggest acute or subacute ischemia or other abnormality. Gray-white matter differentiation well maintained. No other areas of encephalomalacia to suggest chronic cortical infarction or other insult. No foci of susceptibility artifact to suggest acute or chronic intracranial hemorrhage. No mass lesion, midline shift or mass effect. No hydrocephalus or extra-axial fluid collection.  Pituitary gland suprasellar region within normal limits. Midline structures intact. Vascular: Major intracranial vascular flow voids are maintained. Skull and upper cervical spine: Craniocervical junction within normal limits. Bone marrow signal intensity normal. No scalp soft tissue abnormality. Sinuses/Orbits: Globes and orbital soft tissues within normal limits. Mild scattered mucosal thickening noted within the ethmoidal air cells. Paranasal sinuses are otherwise clear. No mastoid effusion. Inner ear structures grossly normal. Other: None. IMPRESSION: 1. No acute intracranial abnormality. 2. Small focus of encephalomalacia at the peripheral left cerebellum, stable from previous. 3. Age-related cerebral atrophy with mild chronic small vessel ischemic disease. Electronically Signed   By: Jeannine Boga M.D.   On: 04/29/2020 00:30   CT ABDOMEN PELVIS W CONTRAST  Result Date: 04/28/2020 CLINICAL DATA:  Increasing confusing, weakness, elevated white blood cell count, lower abdominal pain EXAM: CT ANGIOGRAPHY CHEST CT ABDOMEN AND PELVIS WITH CONTRAST TECHNIQUE: Multidetector CT imaging of the chest was performed using the standard protocol during bolus administration of intravenous contrast. Multiplanar CT image reconstructions and MIPs were obtained to evaluate the vascular anatomy. Multidetector CT imaging of the abdomen and pelvis was performed using the standard protocol during  bolus administration of intravenous contrast. CONTRAST:  5mL OMNIPAQUE IOHEXOL 350 MG/ML SOLN COMPARISON:  06/15/2019 FINDINGS: CTA CHEST FINDINGS Cardiovascular: This is a technically adequate evaluation of the pulmonary vasculature. No filling defects or pulmonary emboli. There is extensive calcification of the mitral annulus. No pericardial effusion. No evidence of thoracic aortic aneurysm or dissection. Continued atherosclerosis of the aorta and coronary vessels. Mediastinum/Nodes: No enlarged mediastinal, hilar, or axillary  lymph nodes. Thyroid gland, trachea, and esophagus demonstrate no significant findings. Lungs/Pleura: Mosaic areas of ground-glass attenuation within the mid and lower lung zones, favor small airway disease and air trapping. There is mild bibasilar bronchial wall thickening. No effusion or pneumothorax. Musculoskeletal: No acute or destructive bony lesions. Reconstructed images demonstrate no additional findings. Review of the MIP images confirms the above findings. CT ABDOMEN and PELVIS FINDINGS Hepatobiliary: No focal liver abnormality is seen. No gallstones, gallbladder wall thickening, or biliary dilatation. Pancreas: Unremarkable. No pancreatic ductal dilatation or surrounding inflammatory changes. Spleen: Normal in size without focal abnormality. Adrenals/Urinary Tract: Stable peripelvic cyst lower pole right kidney. Otherwise the kidneys enhance normally and symmetrically. No urinary tract calculi or evidence of obstruction. Bladder is moderately distended without focal abnormality. The adrenals are normal. Stomach/Bowel: No bowel obstruction or ileus. Moderate stool throughout the colon. No bowel wall thickening or inflammatory change. Vascular/Lymphatic: Aortic atherosclerosis. No enlarged abdominal or pelvic lymph nodes. Reproductive: Status post hysterectomy. No adnexal masses. Other: No free fluid or free gas. No abdominal wall hernia. Musculoskeletal: No acute or destructive bony lesions. Prominent spondylosis at L2-3. Reconstructed images demonstrate no additional findings. Review of the MIP images confirms the above findings. IMPRESSION: 1. No evidence of pulmonary embolus. 2. Bibasilar bronchial wall thickening consistent with bronchitis. Ground-glass attenuation at the bases consistent with small airway disease and air trapping. 3. No acute intra-abdominal or intrapelvic process. 4. Moderate fecal retention. 5. Aortic Atherosclerosis (ICD10-I70.0). Electronically Signed   By: Randa Ngo M.D.    On: 04/28/2020 21:09   ECHOCARDIOGRAM COMPLETE  Result Date: 04/29/2020    ECHOCARDIOGRAM REPORT   Patient Name:   [name] Date of Exam: 04/29/2020 Medical Rec #:  [id number]      Height:       61.0 in Accession #:    [id number]     Weight:       158.0 lb Date of Birth:  1932-03-08      BSA:          1.709 m Patient Age:    73 years       BP:           101/54 mmHg Patient Gender: F              HR:           66 bpm. Exam Location:  [facility] Procedure: 2D Echo, Cardiac Doppler and Color Doppler Indications:     Fever R50.9  History:         Patient has prior history of Echocardiogram examinations. CHF;                  Risk Factors:Hypertension.  Sonographer:     Alyse Low Roar Referring Phys:  [place] Diagnosing Phys: Ida Rogue MD IMPRESSIONS  1. Left ventricular ejection fraction, by estimation, is 60 to 65%. The left ventricle has normal function. The left ventricle has no regional wall motion abnormalities. Left ventricular diastolic parameters are consistent with Grade I diastolic dysfunction (impaired relaxation).  2. Right ventricular  systolic function is normal. The right ventricular size is normal. There is mildly elevated pulmonary artery systolic pressure. The estimated right ventricular systolic pressure is 88.5 mmHg.  3. Left atrial size was mildly dilated.  4. The mitral valve is normal in structure. Mild to moderate mitral valve regurgitation.  5. Tricuspid valve regurgitation is mild to moderate.  6. The aortic valve is normal in structure. Aortic valve regurgitation is mild. FINDINGS  Left Ventricle: Left ventricular ejection fraction, by estimation, is 60 to 65%. The left ventricle has normal function. The left ventricle has no regional wall motion abnormalities. The left ventricular internal cavity size was normal in size. There is  no left ventricular hypertrophy. Left ventricular diastolic parameters are consistent with Grade I diastolic dysfunction (impaired relaxation). Right  Ventricle: The right ventricular size is normal. No increase in right ventricular wall thickness. Right ventricular systolic function is normal. There is mildly elevated pulmonary artery systolic pressure. The tricuspid regurgitant velocity is 2.81  m/s, and with an assumed right atrial pressure of 5 mmHg, the estimated right ventricular systolic pressure is 02.7 mmHg. Left Atrium: Left atrial size was mildly dilated. Right Atrium: Right atrial size was normal in size. Pericardium: There is no evidence of pericardial effusion. Mitral Valve: The mitral valve is normal in structure. Moderate mitral annular calcification. Mild to moderate mitral valve regurgitation. No evidence of mitral valve stenosis. Tricuspid Valve: The tricuspid valve is normal in structure. Tricuspid valve regurgitation is mild to moderate. No evidence of tricuspid stenosis. Aortic Valve: The aortic valve is normal in structure. Aortic valve regurgitation is mild. Mild aortic valve sclerosis is present, with no evidence of aortic valve stenosis. Aortic valve peak gradient measures 14.6 mmHg. Pulmonic Valve: The pulmonic valve was normal in structure. Pulmonic valve regurgitation is not visualized. No evidence of pulmonic stenosis. Aorta: The aortic root is normal in size and structure. Venous: The inferior vena cava is normal in size with greater than 50% respiratory variability, suggesting right atrial pressure of 3 mmHg. IAS/Shunts: No atrial level shunt detected by color flow Doppler.  LEFT VENTRICLE PLAX 2D LVIDd:         4.41 cm  Diastology LVIDs:         3.03 cm  LV e' medial:    5.77 cm/s LV PW:         1.08 cm  LV E/e' medial:  24.4 LV IVS:        1.18 cm  LV e' lateral:   9.46 cm/s LVOT diam:     1.70 cm  LV E/e' lateral: 14.9 LVOT Area:     2.27 cm  RIGHT VENTRICLE RV Mid diam:    3.19 cm RV S prime:     13.20 cm/s TAPSE (M-mode): 2.0 cm LEFT ATRIUM             Index       RIGHT ATRIUM           Index LA diam:        4.00 cm 2.34 cm/m   RA Area:     12.90 cm LA Vol (A2C):   66.2 ml 38.74 ml/m RA Volume:   28.50 ml  16.68 ml/m LA Vol (A4C):   71.4 ml 41.79 ml/m LA Biplane Vol: 70.8 ml 41.43 ml/m  AORTIC VALVE                PULMONIC VALVE AV Area (Vmax): 1.53 cm    PV Vmax:  1.07 m/s AV Vmax:        191.00 cm/s PV Peak grad:   4.6 mmHg AV Peak Grad:   14.6 mmHg   RVOT Peak grad: 2 mmHg LVOT Vmax:      129.00 cm/s  AORTA Ao Root diam: 2.70 cm MITRAL VALVE                TRICUSPID VALVE MV Area (PHT): 2.74 cm     TR Peak grad:   31.6 mmHg MV Decel Time: 277 msec     TR Vmax:        281.00 cm/s MV E velocity: 141.00 cm/s MV A velocity: 156.00 cm/s  SHUNTS MV E/A ratio:  0.90         Systemic Diam: 1.70 cm MV A Prime:    7.9 cm/s Ida Rogue MD Electronically signed by Ida Rogue MD Signature Date/Time: 04/29/2020/8:03:23 PM    Final         Scheduled Meds: . dextromethorphan-guaiFENesin  1 tablet Oral BID  . DULoxetine  20 mg Oral Daily  . famotidine  40 mg Oral QHS  . heparin  5,000 Units Subcutaneous Q8H  . levothyroxine  112 mcg Oral q morning  . liothyronine  5 mcg Oral Daily  . oxybutynin  5 mg Oral TID  . pantoprazole  40 mg Oral Daily  . polyethylene glycol  17 g Oral Daily  . potassium chloride  20 mEq Oral BID  . rOPINIRole  2 mg Oral BID   Continuous Infusions: . ampicillin-sulbactam (UNASYN) IV 1.5 g (04/30/20 0045)     LOS: 2 days    Time spent: 30 minutes    Barb Merino, MD [facility] Pager [phone]

## 2020-04-30 NOTE — Evaluation (Signed)
Physical Therapy Evaluation Patient Details Name: [name] MRN: [id number] DOB: 1932-12-30 Today's Date: 04/30/2020   History of Present Illness  Pt is an 87 y.o. female presenting to hospital 3/4 with weakness, lethargy, excessive sleepiness, worsening congestion and dry cough, and AMS.  Pt admitted with somnolence/lethargy, AMS, and COPD with chronic bronchitis with exacerbation.  PMH includes CHF, CAD, CKD, a-fib, anxiety, lymphoma, htn, s/p complete thyroidectomy 11/17/19, L CTS, and lumbar radiculitis.  H/o lymphedema.  Clinical Impression  Prior to hospital admission, pt was modified independent with ambulation (used cane vs RW vs rollator depending on how she was feeling); lives alone in 3rd floor of apt with elevator access.  Pt reports being very active.  Currently pt is modified independent semi-supine to sitting edge of bed; modified independent with transfers using RW; and SBA ambulating 200 feet with RW.  Pt steady throughout sessions activities (no loss of balance noted).  Pt would benefit from skilled PT to address noted impairments and functional limitations during hospitalization (see below for any additional details).  Upon hospital discharge, no further PT needs anticipated.    Follow Up Recommendations No PT follow up    Equipment Recommendations  None recommended by PT (pt has needed DME at home already)    Recommendations for Other Services       Precautions / Restrictions Precautions Precautions: Fall Restrictions Weight Bearing Restrictions: No      Mobility  Bed Mobility Overal bed mobility: Modified Independent             General bed mobility comments: Semi-supine to sitting edge of bed without any noted difficulties; HOB elevated.    Transfers Overall transfer level: Modified independent Equipment used: Rolling walker (2 wheeled) (youth sized)             General transfer comment: steady safe transfers  noted  Ambulation/Gait Ambulation/Gait assistance: Supervision Gait Distance (Feet): 200 Feet Assistive device: Rolling walker (2 wheeled) (youth sized) Gait Pattern/deviations: Step-through pattern;WFL(Within Functional Limits) Gait velocity: normal to increased at times   General Gait Details: steady ambulation with RW use  Stairs            Wheelchair Mobility    Modified Rankin (Stroke Patients Only)       Balance Overall balance assessment: Needs assistance Sitting-balance support: No upper extremity supported;Feet supported Sitting balance-Leahy Scale: Normal Sitting balance - Comments: steady sitting reaching outside BOS   Standing balance support: No upper extremity supported Standing balance-Leahy Scale: Good Standing balance comment: steady standing reaching within BOS                             Pertinent Vitals/Pain Pain Assessment: 0-10 Pain Score: 6  Pain Location: chronic R shoulder pain Pain Descriptors / Indicators: Sore Pain Intervention(s): Limited activity within patient's tolerance;Monitored during session;Repositioned  Vitals (HR and O2 on room air) stable and WFL throughout treatment session.    Home Living Family/patient expects to be discharged to:: Private residence Living Arrangements: Alone Available Help at Discharge: Family Type of Home: Apartment (3rd floor) Home Access: Elevator     Home Layout: One level Home Equipment: Grab bars - tub/shower;Grab bars - toilet;Walker - 2 wheels;Walker - 4 wheels;Cane - single point      Prior Function Level of Independence: Independent with assistive device(s)         Comments: Pt ambulates with SPC vs RW vs rollator depending on how she feels.  H/o falls (R shoulder pain since fall after thyroid surgery 10/2019).     Hand Dominance        Extremity/Trunk Assessment   Upper Extremity Assessment Upper Extremity Assessment:  (Limited R shoulder AROM flexion d/t h/o R  shoulder injury s/p fall after thyroid surgery in 2021)    Lower Extremity Assessment Lower Extremity Assessment: Overall WFL for tasks assessed    Cervical / Trunk Assessment Cervical / Trunk Assessment: Other exceptions Cervical / Trunk Exceptions: forward head/shoulders  Communication   Communication: No difficulties  Cognition Arousal/Alertness: Awake/alert Behavior During Therapy: WFL for tasks assessed/performed Overall Cognitive Status: Within Functional Limits for tasks assessed                                 General Comments: A&O x4      General Comments   Nursing cleared pt for participation in physical therapy.  Pt agreeable to PT session.    Exercises     Assessment/Plan    PT Assessment Patient needs continued PT services  PT Problem List Decreased mobility       PT Treatment Interventions DME instruction;Gait training;Functional mobility training;Therapeutic activities;Therapeutic exercise;Balance training;Patient/family education    PT Goals (Current goals can be found in the Care Plan section)  Acute Rehab PT Goals Patient Stated Goal: to go home PT Goal Formulation: With patient Time For Goal Achievement: 05/14/20 Potential to Achieve Goals: Good    Frequency Min 2X/week   Barriers to discharge        Co-evaluation               AM-PAC PT "6 Clicks" Mobility  Outcome Measure Help needed turning from your back to your side while in a flat bed without using bedrails?: None Help needed moving from lying on your back to sitting on the side of a flat bed without using bedrails?: None Help needed moving to and from a bed to a chair (including a wheelchair)?: None Help needed standing up from a chair using your arms (e.g., wheelchair or bedside chair)?: None Help needed to walk in hospital room?: A Little Help needed climbing 3-5 steps with a railing? : A Little 6 Click Score: 22    End of Session Equipment Utilized During  Treatment: Gait belt Activity Tolerance: Patient tolerated treatment well Patient left: in chair;with call bell/phone within reach;with chair alarm set Nurse Communication: Mobility status;Precautions PT Visit Diagnosis: Other abnormalities of gait and mobility (R26.89)    Time: 1308-6578 PT Time Calculation (min) (ACUTE ONLY): 28 min   Charges:   PT Evaluation $PT Eval Low Complexity: 1 Low PT Treatments $Therapeutic Activity: 8-22 mins       Leitha Bleak, PT 04/30/20, 3:45 PM

## 2020-05-01 MED ORDER — POTASSIUM CHLORIDE CRYS ER 20 MEQ PO TBCR: 20.0000 meq | EXTENDED_RELEASE_TABLET | Freq: Every day | ORAL | 0 refills | Status: DC

## 2020-05-01 MED ORDER — FUROSEMIDE 40 MG PO TABS: 20.0000 mg | ORAL_TABLET | Freq: Every day | ORAL | 0 refills | Status: DC

## 2020-05-01 NOTE — Progress Notes (Signed)
OT Cancellation Note  Patient Details Name: [name] MRN: [id number] DOB: [birth year]   Cancelled Treatment:    Reason Eval/Treat Not Completed: Other (comment). OT order received and chart reviewed. Pt declined OT intervention this morning and has discharge orders placed.Pt reports, " No, I am fine. I can do everything for myself at home" .   Darleen Crocker, MS, OTR/L , CBIS ascom [phone]  05/01/20, 9:19 AM   05/01/2020, 9:18 AM

## 2020-05-01 NOTE — Discharge Summary (Signed)
Physician Discharge Summary  [name] OMV:[id number] DOB: [birth year] DOA: 04/28/2020  PCP: Tracie Harrier, MD  Admit date: 04/28/2020 Discharge date: 05/01/2020  Admitted From: Home Disposition: Home  Recommendations for Outpatient Follow-up:  1. Follow up with PCP in 1-2 weeks 2. Take over-the-counter cough medications as needed. 3. There are some changes on your diuretics, take new prescriptions. 4. Follow-up on B1 level.  Home Health: Not applicable Equipment/Devices: Not needed  Discharge Condition: Stable CODE STATUS: Full code Diet recommendation: Low-salt diet  Discharge summary: Patient is a 87 year old female who has history of COPD and chronic bronchitis, GERD, hypothyroidism, hypertension, chronic diastolic heart failure and chronic kidney disease stage III AAA who was brought from home by her daughter with lethargy and excessive sleepiness.  She was seen recently at PCP office and found to have high white count and is started on cefdinir.  Her daughter went to visit her and found her very sleepy and difficult to keep her awake so brought to ER. Patient was complaining of just feeling tired.  She does have worsening congestion and dry cough.   Assessment & Plan of care:   1. Somnolence/lethargy: Patient probably has baseline cognitive dysfunction and developed somnolence and lethargy due to ongoing dehydration, bacterial bronchitis. Extensive work-up including MRI of the brain with some encephalomalacia, no acute findings. CTA of the chest with bronchitis changes, no PE. CT scan abdomen pelvis with no abnormalities. B12 is adequate.  B1 levels pending. Supportive treatment. Patient with normal mentation now. She might have been developing early dementia, will need formal evaluation.  Will make referral to neurology for follow-up as outpatient.  2. COPD with chronic bronchitis with exacerbation: Patient with cough and wheezing and worsening congestion. She  does have chronic bronchitis.  Treated with antibiotics.  Received 3 days of therapy.  She has been prescribed cefdinir as outpatient, she can complete therapies.  Patient already has albuterol at home that she will use.  Have suggested her to use over-the-counter Mucinex.  3. Hypothyroidism: On Synthroid 112 mcg.  Dose was increased 2 months ago. TSH is 1.44, T4 is 1.76.  Appropriately corrected.  No indication to alter therapies.  4. Moderate dehydration: Treated with IV fluids.  Corrected.  Potassium replaced.  Patient with good oral intake now. We will decrease her dose of Lasix to half the dose she was taking from 40 to 20 mg.  Potassium replacement.  5. GERD: On PPI.  Patient is clinically stable today to go home.     Discharge Diagnoses:  Principal Problem:   AMS (altered mental status) Active Problems:   HLD (hyperlipidemia)   Chronic kidney disease (CKD), stage III (moderate) (HCC)   Chronic obstructive pulmonary disease (HCC)   Gastro-esophageal reflux disease without esophagitis   Hypothyroidism   Hypertension   Coronary artery disease   Congestive heart failure (CHF) [facility])    Discharge Instructions  Discharge Instructions    Ambulatory referral to Neurology   Complete by: As directed    An appointment is requested in approximately: 4 weeks   Diet - low sodium heart healthy   Complete by: As directed    Discharge instructions   Complete by: As directed    Complete the antibiotic course you taking at home  Take over the counter cough medicine and mucinex   Increase activity slowly   Complete by: As directed      Allergies as of 05/01/2020      Reactions   Sulfa Antibiotics Hives, Rash  Medication List    STOP taking these medications   allopurinol 100 MG tablet Commonly known as: ZYLOPRIM   famotidine 40 MG tablet Commonly known as: PEPCID   triamcinolone 0.1 % Commonly known as: KENALOG   Vitamin D3 25 MCG tablet Commonly known as:  Vitamin D     TAKE these medications   albuterol 108 (90 Base) MCG/ACT inhaler Commonly known as: VENTOLIN HFA Inhale 2 puffs into the lungs every 6 (six) hours as needed for wheezing or shortness of breath.   azelastine 0.1 % nasal spray Commonly known as: ASTELIN Place 1 spray into both nostrils 2 (two) times daily as needed (allergies.).   CALCIUM + D3 PO Take 1 tablet by mouth in the morning and at bedtime.   cefdinir 300 MG capsule Commonly known as: OMNICEF Take by mouth.   diphenhydrAMINE 50 MG tablet Commonly known as: BENADRYL Take 1 tablet (50 mg total) by mouth at bedtime as needed for itching.   DULoxetine 20 MG capsule Commonly known as: CYMBALTA Take 20 mg by mouth daily.   furosemide 40 MG tablet Commonly known as: LASIX Take 0.5 tablets (20 mg total) by mouth daily. What changed:   how much to take  when to take this   levothyroxine 112 MCG tablet Commonly known as: SYNTHROID Take 112 mcg by mouth every morning. What changed: Another medication with the same name was removed. Continue taking this medication, and follow the directions you see here.   liothyronine 5 MCG tablet Commonly known as: CYTOMEL Take by mouth. What changed: Another medication with the same name was removed. Continue taking this medication, and follow the directions you see here.   lovastatin 40 MG tablet Commonly known as: MEVACOR Take 40 mg by mouth daily with supper.   oxybutynin 5 MG tablet Commonly known as: DITROPAN Take 5 mg by mouth 3 (three) times daily.   pantoprazole 40 MG tablet Commonly known as: PROTONIX Take 40 mg by mouth daily.   potassium chloride SA 20 MEQ tablet Commonly known as: KLOR-CON Take 1 tablet (20 mEq total) by mouth daily. What changed:   medication strength  how much to take   rOPINIRole 2 MG tablet Commonly known as: REQUIP Take 2 mg by mouth 2 (two) times daily.   traMADol 50 MG tablet Commonly known as: ULTRAM Take 50 mg  by mouth 2 (two) times daily as needed.       Follow-up Information    Tracie Harrier, MD In 2 weeks.   Specialty: Internal Medicine Contact information: [place] [address] [phone]              Allergies  Allergen Reactions  . Sulfa Antibiotics Hives and Rash    Consultations:  None   Procedures/Studies: CT Head Wo Contrast  Result Date: 04/28/2020 CLINICAL DATA:  Confusion, weakness, dizziness EXAM: CT HEAD WITHOUT CONTRAST TECHNIQUE: Contiguous axial images were obtained from the base of the skull through the vertex without intravenous contrast. COMPARISON:  02/15/2020 FINDINGS: Brain: No acute infarct or hemorrhage. Lateral ventricles and midline structures are unremarkable. No acute extra-axial fluid collections. No mass effect. Vascular: No hyperdense vessel or unexpected calcification. Skull: Normal. Negative for fracture or focal lesion. Sinuses/Orbits: No acute finding. Other: None. IMPRESSION: 1. Stable head CT, no acute intracranial process. Electronically Signed   By: Randa Ngo M.D.   On: 04/28/2020 20:59   CT Angio Chest PE W and/or Wo Contrast  Result Date:  04/28/2020 CLINICAL DATA:  Increasing confusing, weakness, elevated white blood cell count, lower abdominal pain EXAM: CT ANGIOGRAPHY CHEST CT ABDOMEN AND PELVIS WITH CONTRAST TECHNIQUE: Multidetector CT imaging of the chest was performed using the standard protocol during bolus administration of intravenous contrast. Multiplanar CT image reconstructions and MIPs were obtained to evaluate the vascular anatomy. Multidetector CT imaging of the abdomen and pelvis was performed using the standard protocol during bolus administration of intravenous contrast. CONTRAST:  53mL OMNIPAQUE IOHEXOL 350 MG/ML SOLN COMPARISON:  06/15/2019 FINDINGS: CTA CHEST FINDINGS Cardiovascular: This is a technically adequate evaluation of the pulmonary vasculature. No filling defects  or pulmonary emboli. There is extensive calcification of the mitral annulus. No pericardial effusion. No evidence of thoracic aortic aneurysm or dissection. Continued atherosclerosis of the aorta and coronary vessels. Mediastinum/Nodes: No enlarged mediastinal, hilar, or axillary lymph nodes. Thyroid gland, trachea, and esophagus demonstrate no significant findings. Lungs/Pleura: Mosaic areas of ground-glass attenuation within the mid and lower lung zones, favor small airway disease and air trapping. There is mild bibasilar bronchial wall thickening. No effusion or pneumothorax. Musculoskeletal: No acute or destructive bony lesions. Reconstructed images demonstrate no additional findings. Review of the MIP images confirms the above findings. CT ABDOMEN and PELVIS FINDINGS Hepatobiliary: No focal liver abnormality is seen. No gallstones, gallbladder wall thickening, or biliary dilatation. Pancreas: Unremarkable. No pancreatic ductal dilatation or surrounding inflammatory changes. Spleen: Normal in size without focal abnormality. Adrenals/Urinary Tract: Stable peripelvic cyst lower pole right kidney. Otherwise the kidneys enhance normally and symmetrically. No urinary tract calculi or evidence of obstruction. Bladder is moderately distended without focal abnormality. The adrenals are normal. Stomach/Bowel: No bowel obstruction or ileus. Moderate stool throughout the colon. No bowel wall thickening or inflammatory change. Vascular/Lymphatic: Aortic atherosclerosis. No enlarged abdominal or pelvic lymph nodes. Reproductive: Status post hysterectomy. No adnexal masses. Other: No free fluid or free gas. No abdominal wall hernia. Musculoskeletal: No acute or destructive bony lesions. Prominent spondylosis at L2-3. Reconstructed images demonstrate no additional findings. Review of the MIP images confirms the above findings. IMPRESSION: 1. No evidence of pulmonary embolus. 2. Bibasilar bronchial wall thickening consistent  with bronchitis. Ground-glass attenuation at the bases consistent with small airway disease and air trapping. 3. No acute intra-abdominal or intrapelvic process. 4. Moderate fecal retention. 5. Aortic Atherosclerosis (ICD10-I70.0). Electronically Signed   By: Randa Ngo M.D.   On: 04/28/2020 21:09   MR BRAIN WO CONTRAST  Result Date: 04/29/2020 CLINICAL DATA:  Initial evaluation for acute delirium, mental status change. EXAM: MRI HEAD WITHOUT CONTRAST TECHNIQUE: Multiplanar, multiecho pulse sequences of the brain and surrounding structures were obtained without intravenous contrast. COMPARISON:  Prior CT from earlier the same day. FINDINGS: Brain: Diffuse prominence of the CSF containing spaces compatible generalized age-related cerebral atrophy. Minimal chronic microvascular ischemic disease for age. Small focus of encephalomalacia noted at the peripheral left cerebellum, stable from previous. No abnormal foci of restricted diffusion to suggest acute or subacute ischemia or other abnormality. Gray-white matter differentiation well maintained. No other areas of encephalomalacia to suggest chronic cortical infarction or other insult. No foci of susceptibility artifact to suggest acute or chronic intracranial hemorrhage. No mass lesion, midline shift or mass effect. No hydrocephalus or extra-axial fluid collection. Pituitary gland suprasellar region within normal limits. Midline structures intact. Vascular: Major intracranial vascular flow voids are maintained. Skull and upper cervical spine: Craniocervical junction within normal limits. Bone marrow signal intensity normal. No scalp soft tissue abnormality. Sinuses/Orbits: Globes and orbital soft tissues within normal  limits. Mild scattered mucosal thickening noted within the ethmoidal air cells. Paranasal sinuses are otherwise clear. No mastoid effusion. Inner ear structures grossly normal. Other: None. IMPRESSION: 1. No acute intracranial abnormality. 2.  Small focus of encephalomalacia at the peripheral left cerebellum, stable from previous. 3. Age-related cerebral atrophy with mild chronic small vessel ischemic disease. Electronically Signed   By: Jeannine Boga M.D.   On: 04/29/2020 00:30   CT ABDOMEN PELVIS W CONTRAST  Result Date: 04/28/2020 CLINICAL DATA:  Increasing confusing, weakness, elevated white blood cell count, lower abdominal pain EXAM: CT ANGIOGRAPHY CHEST CT ABDOMEN AND PELVIS WITH CONTRAST TECHNIQUE: Multidetector CT imaging of the chest was performed using the standard protocol during bolus administration of intravenous contrast. Multiplanar CT image reconstructions and MIPs were obtained to evaluate the vascular anatomy. Multidetector CT imaging of the abdomen and pelvis was performed using the standard protocol during bolus administration of intravenous contrast. CONTRAST:  74mL OMNIPAQUE IOHEXOL 350 MG/ML SOLN COMPARISON:  06/15/2019 FINDINGS: CTA CHEST FINDINGS Cardiovascular: This is a technically adequate evaluation of the pulmonary vasculature. No filling defects or pulmonary emboli. There is extensive calcification of the mitral annulus. No pericardial effusion. No evidence of thoracic aortic aneurysm or dissection. Continued atherosclerosis of the aorta and coronary vessels. Mediastinum/Nodes: No enlarged mediastinal, hilar, or axillary lymph nodes. Thyroid gland, trachea, and esophagus demonstrate no significant findings. Lungs/Pleura: Mosaic areas of ground-glass attenuation within the mid and lower lung zones, favor small airway disease and air trapping. There is mild bibasilar bronchial wall thickening. No effusion or pneumothorax. Musculoskeletal: No acute or destructive bony lesions. Reconstructed images demonstrate no additional findings. Review of the MIP images confirms the above findings. CT ABDOMEN and PELVIS FINDINGS Hepatobiliary: No focal liver abnormality is seen. No gallstones, gallbladder wall thickening, or  biliary dilatation. Pancreas: Unremarkable. No pancreatic ductal dilatation or surrounding inflammatory changes. Spleen: Normal in size without focal abnormality. Adrenals/Urinary Tract: Stable peripelvic cyst lower pole right kidney. Otherwise the kidneys enhance normally and symmetrically. No urinary tract calculi or evidence of obstruction. Bladder is moderately distended without focal abnormality. The adrenals are normal. Stomach/Bowel: No bowel obstruction or ileus. Moderate stool throughout the colon. No bowel wall thickening or inflammatory change. Vascular/Lymphatic: Aortic atherosclerosis. No enlarged abdominal or pelvic lymph nodes. Reproductive: Status post hysterectomy. No adnexal masses. Other: No free fluid or free gas. No abdominal wall hernia. Musculoskeletal: No acute or destructive bony lesions. Prominent spondylosis at L2-3. Reconstructed images demonstrate no additional findings. Review of the MIP images confirms the above findings. IMPRESSION: 1. No evidence of pulmonary embolus. 2. Bibasilar bronchial wall thickening consistent with bronchitis. Ground-glass attenuation at the bases consistent with small airway disease and air trapping. 3. No acute intra-abdominal or intrapelvic process. 4. Moderate fecal retention. 5. Aortic Atherosclerosis (ICD10-I70.0). Electronically Signed   By: Randa Ngo M.D.   On: 04/28/2020 21:09   ECHOCARDIOGRAM COMPLETE  Result Date: 04/29/2020    ECHOCARDIOGRAM REPORT   Patient Name:   [name] Date of Exam: 04/29/2020 Medical Rec #:  [id number]      Height:       61.0 in Accession #:    [id number]     Weight:       158.0 lb Date of Birth:  1932/11/10      BSA:          1.709 m Patient Age:    87 years       BP:  101/54 mmHg Patient Gender: F              HR:           66 bpm. Exam Location:  [facility] Procedure: 2D Echo, Cardiac Doppler and Color Doppler Indications:     Fever R50.9  History:         Patient has prior history of Echocardiogram  examinations. CHF;                  Risk Factors:Hypertension.  Sonographer:     Alyse Low Roar Referring Phys:  [place] Diagnosing Phys: Ida Rogue MD IMPRESSIONS  1. Left ventricular ejection fraction, by estimation, is 60 to 65%. The left ventricle has normal function. The left ventricle has no regional wall motion abnormalities. Left ventricular diastolic parameters are consistent with Grade I diastolic dysfunction (impaired relaxation).  2. Right ventricular systolic function is normal. The right ventricular size is normal. There is mildly elevated pulmonary artery systolic pressure. The estimated right ventricular systolic pressure is 78.9 mmHg.  3. Left atrial size was mildly dilated.  4. The mitral valve is normal in structure. Mild to moderate mitral valve regurgitation.  5. Tricuspid valve regurgitation is mild to moderate.  6. The aortic valve is normal in structure. Aortic valve regurgitation is mild. FINDINGS  Left Ventricle: Left ventricular ejection fraction, by estimation, is 60 to 65%. The left ventricle has normal function. The left ventricle has no regional wall motion abnormalities. The left ventricular internal cavity size was normal in size. There is  no left ventricular hypertrophy. Left ventricular diastolic parameters are consistent with Grade I diastolic dysfunction (impaired relaxation). Right Ventricle: The right ventricular size is normal. No increase in right ventricular wall thickness. Right ventricular systolic function is normal. There is mildly elevated pulmonary artery systolic pressure. The tricuspid regurgitant velocity is 2.81  m/s, and with an assumed right atrial pressure of 5 mmHg, the estimated right ventricular systolic pressure is 38.1 mmHg. Left Atrium: Left atrial size was mildly dilated. Right Atrium: Right atrial size was normal in size. Pericardium: There is no evidence of pericardial effusion. Mitral Valve: The mitral valve is normal in structure.  Moderate mitral annular calcification. Mild to moderate mitral valve regurgitation. No evidence of mitral valve stenosis. Tricuspid Valve: The tricuspid valve is normal in structure. Tricuspid valve regurgitation is mild to moderate. No evidence of tricuspid stenosis. Aortic Valve: The aortic valve is normal in structure. Aortic valve regurgitation is mild. Mild aortic valve sclerosis is present, with no evidence of aortic valve stenosis. Aortic valve peak gradient measures 14.6 mmHg. Pulmonic Valve: The pulmonic valve was normal in structure. Pulmonic valve regurgitation is not visualized. No evidence of pulmonic stenosis. Aorta: The aortic root is normal in size and structure. Venous: The inferior vena cava is normal in size with greater than 50% respiratory variability, suggesting right atrial pressure of 3 mmHg. IAS/Shunts: No atrial level shunt detected by color flow Doppler.  LEFT VENTRICLE PLAX 2D LVIDd:         4.41 cm  Diastology LVIDs:         3.03 cm  LV e' medial:    5.77 cm/s LV PW:         1.08 cm  LV E/e' medial:  24.4 LV IVS:        1.18 cm  LV e' lateral:   9.46 cm/s LVOT diam:     1.70 cm  LV E/e' lateral: 14.9 LVOT Area:  2.27 cm  RIGHT VENTRICLE RV Mid diam:    3.19 cm RV S prime:     13.20 cm/s TAPSE (M-mode): 2.0 cm LEFT ATRIUM             Index       RIGHT ATRIUM           Index LA diam:        4.00 cm 2.34 cm/m  RA Area:     12.90 cm LA Vol (A2C):   66.2 ml 38.74 ml/m RA Volume:   28.50 ml  16.68 ml/m LA Vol (A4C):   71.4 ml 41.79 ml/m LA Biplane Vol: 70.8 ml 41.43 ml/m  AORTIC VALVE                PULMONIC VALVE AV Area (Vmax): 1.53 cm    PV Vmax:        1.07 m/s AV Vmax:        191.00 cm/s PV Peak grad:   4.6 mmHg AV Peak Grad:   14.6 mmHg   RVOT Peak grad: 2 mmHg LVOT Vmax:      129.00 cm/s  AORTA Ao Root diam: 2.70 cm MITRAL VALVE                TRICUSPID VALVE MV Area (PHT): 2.74 cm     TR Peak grad:   31.6 mmHg MV Decel Time: 277 msec     TR Vmax:        281.00 cm/s MV E  velocity: 141.00 cm/s MV A velocity: 156.00 cm/s  SHUNTS MV E/A ratio:  0.90         Systemic Diam: 1.70 cm MV A Prime:    7.9 cm/s Ida Rogue MD Electronically signed by Ida Rogue MD Signature Date/Time: 04/29/2020/8:03:23 PM    Final    (Echo, Carotid, EGD, Colonoscopy, ERCP)    Subjective: Patient seen and examined.  She is very bothered about the ongoing dry cough otherwise no other overnight events. She is up about, dressed and ready to go home.  Walking in the hallway.   Discharge Exam: Vitals:   05/01/20 0517 05/01/20 0812  BP: 116/73 114/61  Pulse: 86 80  Resp: 16 18  Temp: 98.2 F (36.8 C) 98.1 F (36.7 C)  SpO2: 94% 94%   Vitals:   04/30/20 1934 04/30/20 2356 05/01/20 0517 05/01/20 0812  BP: 116/73 (!) 111/57 116/73 114/61  Pulse: 84 66 86 80  Resp: 20 16 16 18   Temp: 98.2 F (36.8 C) 98.1 F (36.7 C) 98.2 F (36.8 C) 98.1 F (36.7 C)  TempSrc: Oral Oral Oral Oral  SpO2: 96% 98% 94% 94%  Weight:      Height:        General: Pt is alert, awake, not in acute distress Looks comfortable and walking in the hallway looking forward to go home.  Cardiovascular: RRR, S1/S2 +, no rubs, no gallops Respiratory: CTA bilaterally, some expiratory crackles present. Abdominal: Soft, NT, ND, bowel sounds + Extremities: no edema, no cyanosis    The results of significant diagnostics from this hospitalization (including imaging, microbiology, ancillary and laboratory) are listed below for reference.     Microbiology: Recent Results (from the past 240 hour(s))  Resp Panel by RT-PCR (Flu A&B, Covid) Nasopharyngeal Swab     Status: None   Collection Time: 04/28/20  7:11 PM   Specimen: Nasopharyngeal Swab; Nasopharyngeal(NP) swabs in vial transport medium  Result Value Ref Range Status   SARS  Coronavirus 2 by RT PCR NEGATIVE NEGATIVE Final    Comment: (NOTE) SARS-CoV-2 target nucleic acids are NOT DETECTED.  The SARS-CoV-2 RNA is generally detectable in upper  respiratory specimens during the acute phase of infection. The lowest concentration of SARS-CoV-2 viral copies this assay can detect is 138 copies/mL. A negative result does not preclude SARS-Cov-2 infection and should not be used as the sole basis for treatment or other patient management decisions. A negative result may occur with  improper specimen collection/handling, submission of specimen other than nasopharyngeal swab, presence of viral mutation(s) within the areas targeted by this assay, and inadequate number of viral copies(<138 copies/mL). A negative result must be combined with clinical observations, patient history, and epidemiological information. The expected result is Negative.  Fact Sheet for Patients:  EntrepreneurPulse.com.au  Fact Sheet for Healthcare Providers:  IncredibleEmployment.be  This test is no t yet approved or cleared by the [place] FDA and  has been authorized for detection and/or diagnosis of SARS-CoV-2 by FDA under an Emergency Use Authorization (EUA). This EUA will remain  in effect (meaning this test can be used) for the duration of the COVID-19 declaration under Section 564(b)(1) of the Act, 21 U.S.C.section 360bbb-3(b)(1), unless the authorization is terminated  or revoked sooner.       Influenza A by PCR NEGATIVE NEGATIVE Final   Influenza B by PCR NEGATIVE NEGATIVE Final    Comment: (NOTE) The Xpert Xpress SARS-CoV-2/FLU/RSV plus assay is intended as an aid in the diagnosis of influenza from Nasopharyngeal swab specimens and should not be used as a sole basis for treatment. Nasal washings and aspirates are unacceptable for Xpert Xpress SARS-CoV-2/FLU/RSV testing.  Fact Sheet for Patients: EntrepreneurPulse.com.au  Fact Sheet for Healthcare Providers: IncredibleEmployment.be  This test is not yet approved or cleared by the [place] FDA and has been  authorized for detection and/or diagnosis of SARS-CoV-2 by FDA under an Emergency Use Authorization (EUA). This EUA will remain in effect (meaning this test can be used) for the duration of the COVID-19 declaration under Section 564(b)(1) of the Act, 21 U.S.C. section 360bbb-3(b)(1), unless the authorization is terminated or revoked.  Performed at [facility], [address] [place], [place] [address]      Labs: BNP (last 3 results) Recent Labs    02/06/20 1433  BNP 54.0   Basic Metabolic Panel: Recent Labs  Lab 04/28/20 1659 04/28/20 1906 04/30/20 0523  NA 132*  --  137  K 3.2*  --  3.3*  CL 94*  --  101  CO2 27  --  27  GLUCOSE 129*  --  87  BUN 34*  --  27*  CREATININE 1.36*  --  1.12*  CALCIUM 8.5*  --  8.1*  MG  --  2.3 2.2  PHOS  --   --  3.3   Liver Function Tests: Recent Labs  Lab 04/28/20 1659 04/30/20 0523  AST 31 20  ALT 21 17  ALKPHOS 55 44  BILITOT 1.5* 0.8  PROT 6.8 5.7*  ALBUMIN 3.6 2.7*   No results for input(s): LIPASE, AMYLASE in the last 168 hours. No results for input(s): AMMONIA in the last 168 hours. CBC: Recent Labs  Lab 04/28/20 1659 04/30/20 0523  WBC 9.8 5.1  NEUTROABS 8.6* 3.2  HGB 11.7* 10.9*  HCT 34.7* 32.6*  MCV 90.1 91.6  PLT 245 242   Cardiac Enzymes: No results for input(s): CKTOTAL, CKMB, CKMBINDEX, TROPONINI in the last 168 hours. BNP: Invalid  input(s): POCBNP CBG: Recent Labs  Lab 04/28/20 1706  GLUCAP 128*   D-Dimer Recent Labs    04/29/20 0235  DDIMER 1.70*   Hgb A1c No results for input(s): HGBA1C in the last 72 hours. Lipid Profile No results for input(s): CHOL, HDL, LDLCALC, TRIG, CHOLHDL, LDLDIRECT in the last 72 hours. Thyroid function studies Recent Labs    04/28/20 1659  TSH 1.447   Anemia work up Recent Labs    04/29/20 1031  VITAMINB12 713   Urinalysis    Component Value Date/Time   COLORURINE STRAW (A) 04/28/2020 1706   APPEARANCEUR CLEAR (A) 04/28/2020 1706    APPEARANCEUR Clear 03/28/2015 1537   LABSPEC 1.002 (L) 04/28/2020 1706   LABSPEC 1.002 12/06/2013 1654   PHURINE 6.0 04/28/2020 1706   GLUCOSEU NEGATIVE 04/28/2020 1706   GLUCOSEU Negative 12/06/2013 1654   HGBUR NEGATIVE 04/28/2020 1706   BILIRUBINUR NEGATIVE 04/28/2020 1706   BILIRUBINUR Negative 03/28/2015 1537   BILIRUBINUR Negative 12/06/2013 1654   KETONESUR NEGATIVE 04/28/2020 1706   PROTEINUR NEGATIVE 04/28/2020 1706   NITRITE NEGATIVE 04/28/2020 1706   LEUKOCYTESUR NEGATIVE 04/28/2020 1706   LEUKOCYTESUR Negative 12/06/2013 1654   Sepsis Labs Invalid input(s): PROCALCITONIN,  WBC,  LACTICIDVEN Microbiology Recent Results (from the past 240 hour(s))  Resp Panel by RT-PCR (Flu A&B, Covid) Nasopharyngeal Swab     Status: None   Collection Time: 04/28/20  7:11 PM   Specimen: Nasopharyngeal Swab; Nasopharyngeal(NP) swabs in vial transport medium  Result Value Ref Range Status   SARS Coronavirus 2 by RT PCR NEGATIVE NEGATIVE Final    Comment: (NOTE) SARS-CoV-2 target nucleic acids are NOT DETECTED.  The SARS-CoV-2 RNA is generally detectable in upper respiratory specimens during the acute phase of infection. The lowest concentration of SARS-CoV-2 viral copies this assay can detect is 138 copies/mL. A negative result does not preclude SARS-Cov-2 infection and should not be used as the sole basis for treatment or other patient management decisions. A negative result may occur with  improper specimen collection/handling, submission of specimen other than nasopharyngeal swab, presence of viral mutation(s) within the areas targeted by this assay, and inadequate number of viral copies(<138 copies/mL). A negative result must be combined with clinical observations, patient history, and epidemiological information. The expected result is Negative.  Fact Sheet for Patients:  EntrepreneurPulse.com.au  Fact Sheet for Healthcare Providers:   IncredibleEmployment.be  This test is no t yet approved or cleared by the [place] FDA and  has been authorized for detection and/or diagnosis of SARS-CoV-2 by FDA under an Emergency Use Authorization (EUA). This EUA will remain  in effect (meaning this test can be used) for the duration of the COVID-19 declaration under Section 564(b)(1) of the Act, 21 U.S.C.section 360bbb-3(b)(1), unless the authorization is terminated  or revoked sooner.       Influenza A by PCR NEGATIVE NEGATIVE Final   Influenza B by PCR NEGATIVE NEGATIVE Final    Comment: (NOTE) The Xpert Xpress SARS-CoV-2/FLU/RSV plus assay is intended as an aid in the diagnosis of influenza from Nasopharyngeal swab specimens and should not be used as a sole basis for treatment. Nasal washings and aspirates are unacceptable for Xpert Xpress SARS-CoV-2/FLU/RSV testing.  Fact Sheet for Patients: EntrepreneurPulse.com.au  Fact Sheet for Healthcare Providers: IncredibleEmployment.be  This test is not yet approved or cleared by the [place] FDA and has been authorized for detection and/or diagnosis of SARS-CoV-2 by FDA under an Emergency Use Authorization (EUA). This EUA will remain in effect (meaning  this test can be used) for the duration of the COVID-19 declaration under Section 564(b)(1) of the Act, 21 U.S.C. section 360bbb-3(b)(1), unless the authorization is terminated or revoked.  Performed at [facility], [address] [place], [place] [address]      Time coordinating discharge:  35 minutes  SIGNED:   Barb Merino, MD  [facility] 05/01/2020, 10:10 AM

## 2020-05-01 NOTE — Progress Notes (Signed)
Patient is stable and ready for discharge going home. Patient's IV removed without issues. Patient belongings packed by patient and nurse assisted with getting patient dressed. Patient's sister is picking patient up. Writer went over discharge paperwork with patient and she verbalized understanding. Patient is aware she needs to make follow up apt in 2 weeks. Patient transported via [facility] by nurse staff.

## 2020-05-10 LAB — VITAMIN B1: Vitamin B1 (Thiamine): 177.2 nmol/L (ref 66.5–200.0)

## 2020-05-17 ENCOUNTER — Inpatient Hospital Stay
Admission: EM | Admit: 2020-05-17 | Discharge: 2020-05-22 | DRG: 640 | Disposition: A | Discharge status: Home Health | Payer: Medicare Other | Attending: Internal Medicine | Admitting: Internal Medicine

## 2020-05-17 ENCOUNTER — Other Ambulatory Visit: Payer: Self-pay

## 2020-05-17 ENCOUNTER — Observation Stay: Payer: Medicare Other

## 2020-05-17 ENCOUNTER — Emergency Department: Payer: Medicare Other

## 2020-05-17 DIAGNOSIS — Z881 Allergy status to other antibiotic agents status: Secondary | ICD-10-CM

## 2020-05-17 DIAGNOSIS — Z9181 History of falling: Secondary | ICD-10-CM

## 2020-05-17 DIAGNOSIS — Z79899 Other long term (current) drug therapy: Secondary | ICD-10-CM

## 2020-05-17 DIAGNOSIS — S0512XA Contusion of eyeball and orbital tissues, left eye, initial encounter: Secondary | ICD-10-CM | POA: Diagnosis present

## 2020-05-17 DIAGNOSIS — M19011 Primary osteoarthritis, right shoulder: Secondary | ICD-10-CM | POA: Diagnosis present

## 2020-05-17 DIAGNOSIS — R339 Retention of urine, unspecified: Secondary | ICD-10-CM | POA: Diagnosis present

## 2020-05-17 DIAGNOSIS — Z20822 Contact with and (suspected) exposure to covid-19: Secondary | ICD-10-CM | POA: Diagnosis present

## 2020-05-17 DIAGNOSIS — Z79890 Hormone replacement therapy (postmenopausal): Secondary | ICD-10-CM

## 2020-05-17 DIAGNOSIS — Y92039 Unspecified place in apartment as the place of occurrence of the external cause: Secondary | ICD-10-CM

## 2020-05-17 DIAGNOSIS — E876 Hypokalemia: Principal | ICD-10-CM | POA: Diagnosis present

## 2020-05-17 DIAGNOSIS — R531 Weakness: Secondary | ICD-10-CM | POA: Diagnosis present

## 2020-05-17 DIAGNOSIS — Z833 Family history of diabetes mellitus: Secondary | ICD-10-CM

## 2020-05-17 DIAGNOSIS — K219 Gastro-esophageal reflux disease without esophagitis: Secondary | ICD-10-CM | POA: Diagnosis present

## 2020-05-17 DIAGNOSIS — I5032 Chronic diastolic (congestive) heart failure: Secondary | ICD-10-CM | POA: Diagnosis present

## 2020-05-17 DIAGNOSIS — Z9889 Other specified postprocedural states: Secondary | ICD-10-CM

## 2020-05-17 DIAGNOSIS — Z85828 Personal history of other malignant neoplasm of skin: Secondary | ICD-10-CM

## 2020-05-17 DIAGNOSIS — W010XXA Fall on same level from slipping, tripping and stumbling without subsequent striking against object, initial encounter: Secondary | ICD-10-CM | POA: Diagnosis present

## 2020-05-17 DIAGNOSIS — R0602 Shortness of breath: Secondary | ICD-10-CM

## 2020-05-17 DIAGNOSIS — Z8249 Family history of ischemic heart disease and other diseases of the circulatory system: Secondary | ICD-10-CM

## 2020-05-17 DIAGNOSIS — M25411 Effusion, right shoulder: Secondary | ICD-10-CM | POA: Diagnosis present

## 2020-05-17 DIAGNOSIS — E039 Hypothyroidism, unspecified: Secondary | ICD-10-CM | POA: Diagnosis present

## 2020-05-17 DIAGNOSIS — G2581 Restless legs syndrome: Secondary | ICD-10-CM | POA: Diagnosis present

## 2020-05-17 DIAGNOSIS — F419 Anxiety disorder, unspecified: Secondary | ICD-10-CM | POA: Diagnosis present

## 2020-05-17 DIAGNOSIS — M25511 Pain in right shoulder: Secondary | ICD-10-CM

## 2020-05-17 DIAGNOSIS — I130 Hypertensive heart and chronic kidney disease with heart failure and stage 1 through stage 4 chronic kidney disease, or unspecified chronic kidney disease: Secondary | ICD-10-CM | POA: Diagnosis present

## 2020-05-17 DIAGNOSIS — S7012XA Contusion of left thigh, initial encounter: Secondary | ICD-10-CM | POA: Diagnosis present

## 2020-05-17 DIAGNOSIS — R296 Repeated falls: Secondary | ICD-10-CM

## 2020-05-17 DIAGNOSIS — Z90710 Acquired absence of both cervix and uterus: Secondary | ICD-10-CM

## 2020-05-17 DIAGNOSIS — N183 Chronic kidney disease, stage 3 unspecified: Secondary | ICD-10-CM | POA: Diagnosis present

## 2020-05-17 DIAGNOSIS — G9341 Metabolic encephalopathy: Secondary | ICD-10-CM | POA: Diagnosis present

## 2020-05-17 DIAGNOSIS — N1832 Chronic kidney disease, stage 3b: Secondary | ICD-10-CM | POA: Diagnosis present

## 2020-05-17 DIAGNOSIS — Z882 Allergy status to sulfonamides status: Secondary | ICD-10-CM

## 2020-05-17 DIAGNOSIS — Z87440 Personal history of urinary (tract) infections: Secondary | ICD-10-CM

## 2020-05-17 DIAGNOSIS — F32A Depression, unspecified: Secondary | ICD-10-CM | POA: Diagnosis present

## 2020-05-17 DIAGNOSIS — Z8572 Personal history of non-Hodgkin lymphomas: Secondary | ICD-10-CM

## 2020-05-17 DIAGNOSIS — E890 Postprocedural hypothyroidism: Secondary | ICD-10-CM | POA: Diagnosis present

## 2020-05-17 DIAGNOSIS — M65811 Other synovitis and tenosynovitis, right shoulder: Secondary | ICD-10-CM | POA: Diagnosis present

## 2020-05-17 DIAGNOSIS — I10 Essential (primary) hypertension: Secondary | ICD-10-CM | POA: Diagnosis present

## 2020-05-17 DIAGNOSIS — M75121 Complete rotator cuff tear or rupture of right shoulder, not specified as traumatic: Secondary | ICD-10-CM | POA: Diagnosis present

## 2020-05-17 DIAGNOSIS — E785 Hyperlipidemia, unspecified: Secondary | ICD-10-CM | POA: Diagnosis present

## 2020-05-17 DIAGNOSIS — I2510 Atherosclerotic heart disease of native coronary artery without angina pectoris: Secondary | ICD-10-CM | POA: Diagnosis present

## 2020-05-17 DIAGNOSIS — Z955 Presence of coronary angioplasty implant and graft: Secondary | ICD-10-CM

## 2020-05-17 LAB — URINE DRUG SCREEN, QUALITATIVE (ARMC ONLY)
Amphetamines, Ur Screen: NOT DETECTED
Barbiturates, Ur Screen: NOT DETECTED
Benzodiazepine, Ur Scrn: NOT DETECTED
Cannabinoid 50 Ng, Ur ~~LOC~~: NOT DETECTED
Cocaine Metabolite,Ur ~~LOC~~: NOT DETECTED
MDMA (Ecstasy)Ur Screen: NOT DETECTED
Methadone Scn, Ur: NOT DETECTED
Opiate, Ur Screen: NOT DETECTED
Phencyclidine (PCP) Ur S: NOT DETECTED
Tricyclic, Ur Screen: NOT DETECTED

## 2020-05-17 LAB — CBC
HCT: 38.8 % (ref 36.0–46.0)
Hemoglobin: 13 g/dL (ref 12.0–15.0)
MCH: 30.4 pg (ref 26.0–34.0)
MCHC: 33.5 g/dL (ref 30.0–36.0)
MCV: 90.9 fL (ref 80.0–100.0)
Platelets: 328 10*3/uL (ref 150–400)
RBC: 4.27 MIL/uL (ref 3.87–5.11)
RDW: 13.9 % (ref 11.5–15.5)
WBC: 7.2 10*3/uL (ref 4.0–10.5)
nRBC: 0 % (ref 0.0–0.2)

## 2020-05-17 LAB — URINALYSIS, COMPLETE (UACMP) WITH MICROSCOPIC
Bacteria, UA: NONE SEEN
Bilirubin Urine: NEGATIVE
Glucose, UA: NEGATIVE mg/dL
Hgb urine dipstick: NEGATIVE
Ketones, ur: NEGATIVE mg/dL
Leukocytes,Ua: NEGATIVE
Nitrite: NEGATIVE
Protein, ur: NEGATIVE mg/dL
Specific Gravity, Urine: 1.004 — ABNORMAL LOW (ref 1.005–1.030)
Squamous Epithelial / HPF: NONE SEEN (ref 0–5)
pH: 6 (ref 5.0–8.0)

## 2020-05-17 LAB — TSH: TSH: 0.077 u[IU]/mL — ABNORMAL LOW (ref 0.350–4.500)

## 2020-05-17 LAB — BASIC METABOLIC PANEL
Anion gap: 11 (ref 5–15)
BUN: 28 mg/dL — ABNORMAL HIGH (ref 8–23)
CO2: 29 mmol/L (ref 22–32)
Calcium: 9.2 mg/dL (ref 8.9–10.3)
Chloride: 95 mmol/L — ABNORMAL LOW (ref 98–111)
Creatinine, Ser: 1.14 mg/dL — ABNORMAL HIGH (ref 0.44–1.00)
GFR, Estimated: 47 mL/min — ABNORMAL LOW (ref >60)
Glucose, Bld: 124 mg/dL — ABNORMAL HIGH (ref 70–99)
Potassium: 2.9 mmol/L — ABNORMAL LOW (ref 3.5–5.1)
Sodium: 135 mmol/L (ref 135–145)

## 2020-05-17 LAB — TROPONIN I (HIGH SENSITIVITY)
Troponin I (High Sensitivity): 6 ng/L (ref <18)
Troponin I (High Sensitivity): 6 ng/L (ref <18)

## 2020-05-17 MED ORDER — OXYBUTYNIN CHLORIDE 5 MG PO TABS
5.0000 mg | ORAL_TABLET | Freq: Three times a day (TID) | ORAL | Status: DC
  Administered 2020-05-17 – 2020-05-21 (×9): 5 mg via ORAL
  Filled 2020-05-17 (×13): qty 1

## 2020-05-17 MED ORDER — ACETAMINOPHEN 325 MG PO TABS: 325.0000 mg | ORAL_TABLET | Freq: Four times a day (QID) | ORAL | Status: AC | PRN

## 2020-05-17 MED ORDER — ONDANSETRON HCL 4 MG PO TABS: 4.0000 mg | ORAL_TABLET | Freq: Four times a day (QID) | ORAL | Status: DC | PRN

## 2020-05-17 MED ORDER — FUROSEMIDE 20 MG PO TABS
20.0000 mg | ORAL_TABLET | Freq: Every day | ORAL | Status: DC
  Administered 2020-05-18: 20 mg via ORAL
  Filled 2020-05-17: qty 1

## 2020-05-17 MED ORDER — DULOXETINE HCL 20 MG PO CPEP
20.0000 mg | ORAL_CAPSULE | Freq: Every day | ORAL | Status: DC
  Administered 2020-05-18 – 2020-05-22 (×4): 20 mg via ORAL
  Filled 2020-05-17 (×6): qty 1

## 2020-05-17 MED ORDER — LIOTHYRONINE SODIUM 5 MCG PO TABS
5.0000 ug | ORAL_TABLET | Freq: Every day | ORAL | Status: DC
  Filled 2020-05-17: qty 1

## 2020-05-17 MED ORDER — PANTOPRAZOLE SODIUM 40 MG PO TBEC
40.0000 mg | DELAYED_RELEASE_TABLET | Freq: Every day | ORAL | Status: DC
  Administered 2020-05-18 – 2020-05-22 (×4): 40 mg via ORAL
  Filled 2020-05-17 (×4): qty 1

## 2020-05-17 MED ORDER — PRAVASTATIN SODIUM 20 MG PO TABS
40.0000 mg | ORAL_TABLET | Freq: Every day | ORAL | Status: DC
  Administered 2020-05-18 – 2020-05-21 (×3): 40 mg via ORAL
  Filled 2020-05-17 (×3): qty 2

## 2020-05-17 MED ORDER — ROPINIROLE HCL 1 MG PO TABS
2.0000 mg | ORAL_TABLET | Freq: Two times a day (BID) | ORAL | Status: DC
  Administered 2020-05-17 – 2020-05-22 (×9): 2 mg via ORAL
  Filled 2020-05-17 (×9): qty 2

## 2020-05-17 MED ORDER — SODIUM CHLORIDE 0.9 % IV BOLUS
500.0000 mL | Freq: Once | INTRAVENOUS | Status: AC
  Administered 2020-05-17: 500 mL via INTRAVENOUS

## 2020-05-17 MED ORDER — HEPARIN SODIUM (PORCINE) 5000 UNIT/ML IJ SOLN
5000.0000 [IU] | Freq: Three times a day (TID) | INTRAMUSCULAR | Status: DC
  Administered 2020-05-17 – 2020-05-22 (×14): 5000 [IU] via SUBCUTANEOUS
  Filled 2020-05-17 (×14): qty 1

## 2020-05-17 MED ORDER — POTASSIUM CHLORIDE CRYS ER 20 MEQ PO TBCR
40.0000 meq | EXTENDED_RELEASE_TABLET | Freq: Once | ORAL | Status: AC
  Administered 2020-05-17: 40 meq via ORAL
  Filled 2020-05-17: qty 2

## 2020-05-17 MED ORDER — ACETAMINOPHEN 650 MG RE SUPP: 325.0000 mg | Freq: Four times a day (QID) | RECTAL | Status: AC | PRN

## 2020-05-17 MED ORDER — ONDANSETRON HCL 4 MG/2ML IJ SOLN: 4.0000 mg | Freq: Four times a day (QID) | INTRAMUSCULAR | Status: DC | PRN

## 2020-05-17 NOTE — ED Notes (Signed)
Patient denies pain and is resting comfortably.  

## 2020-05-17 NOTE — ED Notes (Signed)
Patient transported to CT 

## 2020-05-17 NOTE — ED Notes (Signed)
ED Provider at bedside. 

## 2020-05-17 NOTE — ED Notes (Signed)
Helped pt to toilet in room. Pt urinated. Back in bed. Steady gait noted. Daughter at bedside.

## 2020-05-17 NOTE — ED Notes (Signed)
Family updated as to patient's status. Patient's daughter notified of pending admission.

## 2020-05-17 NOTE — ED Triage Notes (Signed)
Pt to ER with daughter. Reports increased falls with two today. Pt reports no memory of the events, was able to get up the first time and get to her neighbor's house and contact her daughter. When daughter got to her house she had fallen a second time and now has a black eye on the left side. Pt also broke out in hives three weeks ago.  Pt has also been over medicating with potassium, lasix, , and synthyroid pills.   Hx of UTI. Daughter reports increase in confusion with cefdinir.

## 2020-05-17 NOTE — ED Notes (Addendum)
Patient assisted to restroom with 1x assist. Patient gait unsteady. Patient removed dentures while on the toilet. Dentures placed in a denture cup and provided back to patient.

## 2020-05-17 NOTE — ED Notes (Signed)
Family updated as to patient's status. Daughter updated on inpatient bed assignment.

## 2020-05-17 NOTE — H&P (Signed)
History and Physical   [name] GBT:[id number] DOB: [birth year] DOA: 05/17/2020  PCP: Tracie Harrier, MD  Patient coming from: Home  I have personally briefly reviewed patient's old medical records in [place].  Chief Concern: Weakness and fall  HPI: [name] is a 87 y.o. female with medical history significant for hypothyroid secondary to thyroidectomy, thyroid nodule status post thyroidectomy (01/2020), on levothyroxine and liothyronine T3, nonhodgkin's lymphoma and completed chemotherapy about 10 years ago,  presents emergency department for chief concerns of frequent fall.  She fell on Tuesday morning, 05/16/20.  She denies remembering whether she felt lightheaded, overt chest pain, shortness of breath, dizziness, palpitations.  At baseline, she ambulates with walker and/or cane, however she didn't use either her cane or walker when she fell yesterday. She doesn't remember or know how she fell. She just felt like she was pushed and she fell backwards hitting her head. Initially, the pain on the back of her head was so great, she thought she 'busted her head open'.  She felt like at that time her pain was a 10 out of 10.  At this time, she does not have head pain like she did.  Her pain has decreased to a 2 out of 10.  She does not remember hitting her left eye or the left side of her face. She has bilateral lower extremity swelling at baseline and nothing has changed.  She denies new shortness of breath, chest pain, dysuria, hematuria, abdominal pain   She reports baseline consitpation and states she did have a good and normal bowel movement in the ED room.   Social history: lives by herself.  She denies history of tobacco use and recreational drug use.  She reports that she has not had a drink of alcohol over 20 years when she found God.  She is currently retired.  ROS: Constitutional: no weight change, no fever ENT/Mouth: no sore throat, no rhinorrhea Eyes: no eye  pain, no vision changes Cardiovascular: no chest pain, no dyspnea,  no edema, no palpitations Respiratory: no cough, no sputum, no wheezing Gastrointestinal: no nausea, no vomiting, no diarrhea, no constipation Genitourinary: no urinary incontinence, no dysuria, no hematuria Musculoskeletal: + arthralgias, no myalgias Skin: no skin lesions, no pruritus, Neuro: + weakness, no loss of consciousness, no syncope Psych: no anxiety, no depression, no decrease appetite Heme/Lymph: no bruising, no bleeding  ED Course: Discussed with ED provider, patient requiring hospitalization due to weakness and felt to not be a safe discharge home as she lives by herself.  Daughter at bedside expresses wish for short-term placement.  CT of the head without contrast per ED provider was read as mild chronic ischemic white matter disease.  No acute intracranial abnormality seen.  Vitals in the emergency department show temperature of 98 Fahrenheit, respiration rate of 17 to 19, heart rate of 74, blood pressure 116/58, satting at 98% on room air.  Labs in the emergency department revealed potassium of 2.9, serum sodium 135, chloride 95, bicarb 29, BUN 28, serum creatinine of 1.14, nonfasting blood glucose 124, EGFR 47.  CBC was within normal limits.  TSH was 0.077.    Urine analysis was negative for nitrites, ketones, leukocytes, specific gravity was 1.004.  ED provider gave patient 1 dose of by mouth 40 mEq potassium chloride and normal saline bolus of 500 mL.  Assessment/Plan  Principal Problem:   Frequent falls Active Problems:   HLD (hyperlipidemia)   Anxiety   Chronic kidney disease (  CKD), stage III (moderate) (HCC)   S/P complete thyroidectomy   Hypothyroidism   Hypertension   Hypokalemia   Weakness and frequent falls-etiology remains uncertain at this time -Unsafe discharge home as patient lives alone -Checking B12, vitamin D, UDS -Fall precaution -PT, OT, TOC ordered -Admit to  observation, MedSurg with telemetry for 24 hours  TSH is low-query euthyroid sick syndrome -However T4 is pending -We will check free T4 -If T4 is high, I would recommend consider adjusting the dose of levothyroxine adjustment and keep liothyronine dose the same -If T4 is not overtly high, no changes to thyroid medication recommend -Outpatient follow-up -Liothyronine 5 mcg daily resumed for 05/18/2020 in the AM -Levothyroxine 112 mcg has not been reordered  Right-sided shoulder pain-limited flexion compared to left side -Right shoulder x-ray was read as no acute fracture or dislocation chronic widening of the [facility] joint measuring 7.  Bones are osteopenic  Left eye ecchymosis-present on admission secondary to fall  Hyperlipidemia-resumed lovastatin 40 mg nightly  Urinary retention-oxybutynin 5 mg 3 times daily  GERD-pantoprazole 40 mg daily  Restless leg syndrome-resumed home ropinirole 2 mg twice daily  Depression/anxiety-resumed home duloxetine 20 mg daily  Chart reviewed.   DVT prophylaxis: TED hose, heparin Code Status: Full code Diet: Heart healthy Family Communication: No daughter just left for home Disposition Plan: Course Consults called: TOC, PT, OT Admission status: Observation, MedSurg, with telemetry  Past Medical History:  Diagnosis Date  . Afib ([place])   . Anxiety   . Anxiety   . Basal cell carcinoma 11/11/2017   Right inferior knee. Superficial and nodular patterns.  . Bronchitis    recent  . Cancer ([place])    Lymphoma   . Chronic kidney disease    ckd stage 3  . Congestive heart failure (CHF) ([place])   . Coronary artery disease    Status post stent placement in LAD  . Dysrhythmia   . GERD (gastroesophageal reflux disease)   . Hyperlipemia   . Hypertension   . Hypothyroidism   . Thyroid disease    Past Surgical History:  Procedure Laterality Date  . ABDOMINAL HYSTERECTOMY     complete  . CORONARY ANGIOPLASTY     stent  . CORONARY STENT PLACEMENT     . EXCISION OF ABDOMINAL WALL TUMOR Right 05/08/2018   Procedure: EXCISION OF ABDOMINAL WALL-RIGHT;  Surgeon: Herbert Pun, MD;  Location: [facility] ORS;  Service: General;  Laterality: Right;  . INCISIONAL HERNIA REPAIR N/A 05/08/2018   Procedure: LAPAROSCOPIC VS. OPEN INCISIONAL HERNIA REPAIR WITH MESH;  Surgeon: Herbert Pun, MD;  Location: [facility] ORS;  Service: General;  Laterality: N/A;  . THYROIDECTOMY Bilateral 11/17/2019   Procedure: TOTAL THYROIDECTOMY;  Surgeon: Margaretha Sheffield, MD;  Location: [facility] ORS;  Service: ENT;  Laterality: Bilateral;  . VEIN SURGERY     Endovenous ablation of saphenous vein   Social History:  reports that she has never smoked. She has never used smokeless tobacco. She reports that she does not drink alcohol and does not use drugs.  Allergies  Allergen Reactions  . Cefdinir Other (See Comments)  . Sulfa Antibiotics Hives and Rash   Family History  Problem Relation Age of Onset  . Heart Problems Mother   . Heart Problems Father   . Heart attack Father   . Diabetes Brother   . Diabetes Brother    Family history: Family history reviewed and not pertinent  Prior to Admission medications   Medication Sig Start Date End Date Taking?  Authorizing Provider  Calcium Carb-Cholecalciferol (CALCIUM + D3 PO) Take 1 tablet by mouth in the morning and at bedtime.   Yes [provider]  DULoxetine (CYMBALTA) 20 MG capsule Take 20 mg by mouth daily.  04/14/19  Yes [provider]  furosemide (LASIX) 40 MG tablet Take 0.5 tablets (20 mg total) by mouth daily. Patient taking differently: Take 40 mg by mouth daily. 05/01/20  Yes Barb Merino, MD  levothyroxine (SYNTHROID) 112 MCG tablet Take 112 mcg by mouth every morning. 04/22/20  Yes [provider]  liothyronine (CYTOMEL) 5 MCG tablet Take 5 mcg by mouth daily. 04/14/20 04/14/21 Yes [provider]  lovastatin (MEVACOR) 40 MG tablet Take 40 mg by mouth daily with supper.   03/01/15  Yes [provider]  oxybutynin (DITROPAN) 5 MG tablet Take 5 mg by mouth 3 (three) times daily. 08/14/19  Yes [provider]  pantoprazole (PROTONIX) 40 MG tablet Take 40 mg by mouth daily. 04/14/19  Yes [provider]  potassium chloride SA (KLOR-CON) 20 MEQ tablet Take 1 tablet (20 mEq total) by mouth daily. Patient taking differently: Take 10 mEq by mouth daily. 05/01/20 05/31/20 Yes Ghimire, Dante Gang, MD  rOPINIRole (REQUIP) 2 MG tablet Take 2 mg by mouth 2 (two) times daily.   Yes [provider]  traMADol (ULTRAM) 50 MG tablet Take 50 mg by mouth 2 (two) times daily as needed. 11/27/19  Yes [provider]   Physical Exam: Vitals:   05/17/20 1930 05/17/20 2000 05/17/20 2030 05/17/20 2100  BP: 123/62 114/62 (!) 107/52 106/63  Pulse: 75 75 72 73  Resp: 20 19 20 17   Temp:      SpO2: 99%   98%  Weight:      Height:       Constitutional: appears age-appropriate, NAD, calm, comfortable Eyes: PERRL, lids and conjunctivae normal ENMT: Mucous membranes are moist. Posterior pharynx clear of any exudate or lesions. Age-appropriate dentition. Dentures in place.  Mild hearing loss Neck: normal, supple, no masses, no thyromegaly Respiratory: clear to auscultation bilaterally, no wheezing, no crackles. Normal respiratory effort. No accessory muscle use.  Cardiovascular: Regular rate and rhythm, no murmurs / rubs / gallops. No extremity edema. 2+ pedal pulses. No carotid bruits.  Abdomen: no tenderness, no masses palpated, no hepatosplenomegaly. Bowel sounds positive.  Musculoskeletal: no clubbing / cyanosis. No joint deformity upper and lower extremities. Good ROM, no contractures, no atrophy. Normal muscle tone.  Skin: no rashes, lesions, ulcers. No induration Neurologic: Sensation intact. Strength 5/5 in all 4.  Psychiatric: Normal judgment and insight. Alert and oriented x 3. Normal mood.   EKG: independently reviewed, showing sinus rhythm with  rate of 76, QTc 461  Chest x-ray on Admission: I personally reviewed and I agree with radiologist reading as below.  DG Shoulder Right  Result Date: 05/17/2020 CLINICAL DATA:  87 year old female with fall and right shoulder pain. EXAM: RIGHT SHOULDER - 2+ VIEW COMPARISON:  Chest radiograph dated 0 05/17/2020 FINDINGS: There is no acute fracture or dislocation. The bones are osteopenic. There is chronic widening of the [facility] joint measuring approximately 7 mm. The soft tissues are unremarkable. IMPRESSION: No acute fracture or dislocation. Electronically Signed   By: Anner Crete M.D.   On: 05/17/2020 22:41   CT HEAD WO CONTRAST  Result Date: 05/17/2020 CLINICAL DATA:  Head injury after fall. EXAM: CT HEAD WITHOUT CONTRAST TECHNIQUE: Contiguous axial images were obtained from the base of the skull through the vertex without  intravenous contrast. COMPARISON:  April 28, 2020. FINDINGS: Brain: Mild chronic ischemic white matter disease is noted. No mass effect or midline shift is noted. Ventricular size is within normal limits. There is no evidence of mass lesion, hemorrhage or acute infarction. Vascular: No hyperdense vessel or unexpected calcification. Skull: Normal. Negative for fracture or focal lesion. Sinuses/Orbits: No acute finding. Other: None. IMPRESSION: Mild chronic ischemic white matter disease. No acute intracranial abnormality seen. Electronically Signed   By: Marijo Conception M.D.   On: 05/17/2020 16:02   DG Chest Port 1 View  Result Date: 05/17/2020 CLINICAL DATA:  Fall for the last 2 days EXAM: PORTABLE CHEST 1 VIEW COMPARISON:  February 15, 2020 FINDINGS: The heart size and mediastinal contours are within normal limits. Both lungs are clear. The visualized skeletal structures are unremarkable. IMPRESSION: No active disease. Electronically Signed   By: Prudencio Pair M.D.   On: 05/17/2020 22:42   Labs on Admission: I have personally reviewed following labs  CBC: Recent Labs  Lab  05/17/20 1609  WBC 7.2  HGB 13.0  HCT 38.8  MCV 90.9  PLT 264   Basic Metabolic Panel: Recent Labs  Lab 05/17/20 1609  NA 135  K 2.9*  CL 95*  CO2 29  GLUCOSE 124*  BUN 28*  CREATININE 1.14*  CALCIUM 9.2   GFR: Estimated Creatinine Clearance: 32.8 mL/min (A) (by C-G formula based on SCr of 1.14 mg/dL (H)).  Thyroid Function Tests: Recent Labs    05/17/20 2032  TSH 0.077*   Urine analysis:    Component Value Date/Time   COLORURINE STRAW (A) 05/17/2020 1609   APPEARANCEUR CLEAR (A) 05/17/2020 1609   APPEARANCEUR Clear 03/28/2015 1537   LABSPEC 1.004 (L) 05/17/2020 1609   LABSPEC 1.002 12/06/2013 1654   PHURINE 6.0 05/17/2020 1609   GLUCOSEU NEGATIVE 05/17/2020 1609   GLUCOSEU Negative 12/06/2013 1654   HGBUR NEGATIVE 05/17/2020 1609   BILIRUBINUR NEGATIVE 05/17/2020 1609   BILIRUBINUR Negative 03/28/2015 1537   BILIRUBINUR Negative 12/06/2013 [place] 05/17/2020 1609   PROTEINUR NEGATIVE 05/17/2020 1609   NITRITE NEGATIVE 05/17/2020 1609   LEUKOCYTESUR NEGATIVE 05/17/2020 1609   LEUKOCYTESUR Negative 12/06/2013 1654   Averill Pons N Monserrat Vidaurri D.O. [facility]  If 7PM-7AM, please contact overnight-coverage provider If 7AM-7PM, please contact day coverage provider [email or web address]  05/17/2020, 10:54 PM

## 2020-05-17 NOTE — ED Notes (Signed)
Dr. Archie Balboa updated on patient unsteady gait. Patient updated [name].

## 2020-05-17 NOTE — ED Provider Notes (Signed)
[facility] Emergency Department Provider Note   ____________________________________________   I have reviewed the triage vital signs and the nursing notes.   HISTORY  Chief Complaint Fall   History limited by: Not Limited   HPI [name] is a 87 y.o. female who presents to the emergency department today because of concern for falls. The patient had her first fall around 7 this morning. Was able to get up by herself and get dressed and go to a neighbors to call her daughter. She says that she did hit the back of her head. The patient is not sure why she fell and is not sure if she passed out. She then had another fall later this afternoon, again not recalling what happened. She does not remember having any chest pain or shortness of breath. No palpitations. Daughter states it appears she has been taking more of her medication then prescribed.    Records reviewed. Per medical record review patient has a history of HLD, HTN. Recent admission for confusion. Recent UTI.   Past Medical History:  Diagnosis Date  . Afib ([place])   . Anxiety   . Anxiety   . Basal cell carcinoma 11/11/2017   Right inferior knee. Superficial and nodular patterns.  . Bronchitis    recent  . Cancer ([place])    Lymphoma   . Chronic kidney disease    ckd stage 3  . Congestive heart failure (CHF) ([place])   . Coronary artery disease    Status post stent placement in LAD  . Dysrhythmia   . GERD (gastroesophageal reflux disease)   . Hyperlipemia   . Hypertension   . Hypothyroidism   . Thyroid disease     Patient Active Problem List   Diagnosis Date Noted  . AMS (altered mental status) 04/28/2020  . Hypothyroidism   . Hypertension   . Coronary artery disease   . Congestive heart failure (CHF) ([place])   . Major depressive disorder, recurrent, in remission ([place]) 04/14/2020  . S/P complete thyroidectomy 11/17/2019  . Acute cystitis without hematuria 09/28/2019  . Varicose veins  of leg with pain, bilateral 08/10/2019  . Swelling of limb 06/01/2019  . Leg pain 03/10/2019  . History of lymphoma 03/08/2019  . Vascular disease 11/25/2018  . Chronic obstructive pulmonary disease ([place]) 05/22/2018  . Incisional hernia 05/08/2018  . Gastro-esophageal reflux disease without esophagitis 07/24/2017  . Left carpal tunnel syndrome 03/29/2016  . Varicose veins of leg with pain, left 01/23/2016  . Gross hematuria 03/28/2015  . Nephrolithiasis 03/28/2015  . Chronic kidney disease (CKD), stage III (moderate) ([place]) 03/23/2015  . Pneumonia 02/10/2015  . HLD (hyperlipidemia) 07/29/2014  . Clinical depression 07/29/2014  . Arteriosclerosis of coronary artery 07/29/2014  . Anxiety 07/29/2014  . Diverticulitis 07/23/2014  . Edema leg 07/08/2014  . Acute bronchitis due to infection 01/24/2014  . Lumbar canal stenosis 12/13/2013  . Neuritis or radiculitis due to rupture of lumbar intervertebral disc 12/13/2013  . Lumbar radiculitis 12/13/2013    Past Surgical History:  Procedure Laterality Date  . ABDOMINAL HYSTERECTOMY     complete  . CORONARY ANGIOPLASTY     stent  . CORONARY STENT PLACEMENT    . EXCISION OF ABDOMINAL WALL TUMOR Right 05/08/2018   Procedure: EXCISION OF ABDOMINAL WALL-RIGHT;  Surgeon: Herbert Pun, MD;  Location: [facility] ORS;  Service: General;  Laterality: Right;  . INCISIONAL HERNIA REPAIR N/A 05/08/2018   Procedure: LAPAROSCOPIC VS. OPEN INCISIONAL HERNIA REPAIR WITH MESH;  Surgeon:  Herbert Pun, MD;  Location: [facility] ORS;  Service: General;  Laterality: N/A;  . THYROIDECTOMY Bilateral 11/17/2019   Procedure: TOTAL THYROIDECTOMY;  Surgeon: Margaretha Sheffield, MD;  Location: [facility] ORS;  Service: ENT;  Laterality: Bilateral;  . VEIN SURGERY     Endovenous ablation of saphenous vein    Prior to Admission medications   Medication Sig Start Date End Date Taking? Authorizing Provider  albuterol (VENTOLIN HFA) 108 (90 Base) MCG/ACT inhaler Inhale 2  puffs into the lungs every 6 (six) hours as needed for wheezing or shortness of breath. Patient not taking: Reported on 04/28/2020 03/01/19   Merlyn Lot, MD  azelastine (ASTELIN) 0.1 % nasal spray Place 1 spray into both nostrils 2 (two) times daily as needed (allergies.).  09/18/19   [provider]  Calcium Carb-Cholecalciferol (CALCIUM + D3 PO) Take 1 tablet by mouth in the morning and at bedtime.    [provider]  diphenhydrAMINE (BENADRYL) 50 MG tablet Take 1 tablet (50 mg total) by mouth at bedtime as needed for itching. 04/15/20   Rudene Re, MD  DULoxetine (CYMBALTA) 20 MG capsule Take 20 mg by mouth daily.  04/14/19   [provider]  furosemide (LASIX) 40 MG tablet Take 0.5 tablets (20 mg total) by mouth daily. 05/01/20   Barb Merino, MD  levothyroxine (SYNTHROID) 112 MCG tablet Take 112 mcg by mouth every morning. 04/22/20   [provider]  liothyronine (CYTOMEL) 5 MCG tablet Take by mouth. 04/14/20 04/14/21  [provider]  lovastatin (MEVACOR) 40 MG tablet Take 40 mg by mouth daily with supper.  03/01/15   [provider]  oxybutynin (DITROPAN) 5 MG tablet Take 5 mg by mouth 3 (three) times daily. 08/14/19   [provider]  pantoprazole (PROTONIX) 40 MG tablet Take 40 mg by mouth daily. 04/14/19   [provider]  potassium chloride SA (KLOR-CON) 20 MEQ tablet Take 1 tablet (20 mEq total) by mouth daily. 05/01/20 05/31/20  Barb Merino, MD  rOPINIRole (REQUIP) 2 MG tablet Take 2 mg by mouth 2 (two) times daily.    [provider]  traMADol (ULTRAM) 50 MG tablet Take 50 mg by mouth 2 (two) times daily as needed. 11/27/19   [provider]    Allergies Sulfa antibiotics  Family History  Problem Relation Age of Onset  . Heart Problems Mother   . Heart Problems Father   . Heart attack Father   . Diabetes Brother   . Diabetes Brother     Social History Social History   Tobacco Use  .  Smoking status: Never Smoker  . Smokeless tobacco: Never Used  Vaping Use  . Vaping Use: Never used  Substance Use Topics  . Alcohol use: No  . Drug use: No    Review of Systems Constitutional: No fever/chills Eyes: No visual changes. ENT: No sore throat. Cardiovascular: Denies chest pain. Respiratory: Denies shortness of breath. Gastrointestinal: No abdominal pain.  No nausea, no vomiting.  No diarrhea.   Genitourinary: Negative for dysuria. Musculoskeletal: Negative for back pain. Skin: Positive for bruising around the left eye.  Neurological: Positive for headache.  ____________________________________________   PHYSICAL EXAM:  VITAL SIGNS: ED Triage Vitals  Enc Vitals Group     BP 05/17/20 1532 121/67     Pulse Rate 05/17/20 1532 80     Resp 05/17/20 1532 18     Temp 05/17/20 1532 98 F (36.7 C)     Temp src --  SpO2 05/17/20 1532 99 %     Weight 05/17/20 1533 158 lb 11.7 oz (72 kg)     Height 05/17/20 1533 5\' 1"  (1.549 m)     Head Circumference --      Peak Flow --      Pain Score 05/17/20 1533 7   Constitutional: Alert and oriented.  Eyes: Conjunctivae are normal. Periorbital ecchymosis to the left eye. EOMI.  ENT      Head: Normocephalic and atraumatic.      Nose: No congestion/rhinnorhea.      Mouth/Throat: Mucous membranes are moist.      Neck: No stridor. No midline tenderness.  Hematological/Lymphatic/Immunilogical: No cervical lymphadenopathy. Cardiovascular: Normal rate, regular rhythm.  No murmurs, rubs, or gallops.  Respiratory: Normal respiratory effort without tachypnea nor retractions. Breath sounds are clear and equal bilaterally. No wheezes/rales/rhonchi. Gastrointestinal: Soft and non tender. No rebound. No guarding.  Genitourinary: Deferred Musculoskeletal: Normal range of motion in all extremities. No lower extremity edema. Neurologic:  Normal speech and language. No gross focal neurologic deficits are appreciated.  Skin:  Skin is  warm, dry and intact. No rash noted. Psychiatric: Mood and affect are normal. Speech and behavior are normal. Patient exhibits appropriate insight and judgment.  ____________________________________________    LABS (pertinent positives/negatives)  Trop hs 6 BMP na 135, k 2.9, glu 124, cr 1.14 CBC wbc 7.2, hgb 13.0, plt 328 UDS clear, 0-5 rbc and wbc ____________________________________________   EKG  I, Nance Pear, attending physician, personally viewed and interpreted this EKG  EKG Time: 1534 Rate: 76 Rhythm: normal sinus rhythm Axis: normal Intervals: qtc 461 QRS: narrow ST changes: no st elevation Impression: normal ekg  ____________________________________________    RADIOLOGY  CT head No acute abnormality  ____________________________________________   PROCEDURES  Procedures  ____________________________________________   INITIAL IMPRESSION / ASSESSMENT AND PLAN / ED COURSE  Pertinent labs & imaging results that were available during my care of the patient were reviewed by me and considered in my medical decision making (see chart for details).   Patient presents to the emergency department today after suffering 2 falls.  Work-up here without any obvious signs of infection.  Found was negative x2.  Patient was found to have slightly decreased potassium.  It does sound like the patient has been missed taking her medication.  She was observed in the emergency department for a number of hours and continued to be quite weak and unable to safely ambulate.  Will plan on admission to the hospital service.  At this time do wonder if patient is suffering from medication issues.  ____________________________________________   FINAL CLINICAL IMPRESSION(S) / ED DIAGNOSES  Final diagnoses:  Shortness of breath  Weakness  Right shoulder pain  Frequent falls     Note: This dictation was prepared with Dragon dictation. Any transcriptional errors that result  from this process are unintentional     Nance Pear, MD 05/17/20 2239

## 2020-05-17 NOTE — ED Notes (Signed)
Patient and daughter updated on POC.

## 2020-05-17 NOTE — ED Notes (Signed)
Patient ambulated to in room restroom with steady gait and stand by assistance. Cane at bedside.

## 2020-05-17 NOTE — ED Notes (Signed)
Transport requested for patient transport to floor.

## 2020-05-17 NOTE — ED Notes (Signed)
Daughter reports 2 falls in the last 24 hours. Daughter also reports some confusion. Daughter reports the patient cannot remember what pills she has or hasn't taken, and has taken multiple doses of her medications today. The patient reports she remembers falling around 7am this morning, but denies remembering her second fall today. Patient is alert, oriented x4 at this time. Patient denies neuro symptoms, but reports pain to the right shoulder and right elbow.

## 2020-05-17 NOTE — ED Notes (Signed)
External urinary catheter placed r/t patient unsteady gait.

## 2020-05-17 NOTE — ED Notes (Signed)
Patient appears to be sleeping. Respirations even and unlabored. NADN.

## 2020-05-18 DIAGNOSIS — R296 Repeated falls: Secondary | ICD-10-CM | POA: Diagnosis not present

## 2020-05-18 LAB — BASIC METABOLIC PANEL
Anion gap: 11 (ref 5–15)
BUN: 25 mg/dL — ABNORMAL HIGH (ref 8–23)
CO2: 28 mmol/L (ref 22–32)
Calcium: 8.7 mg/dL — ABNORMAL LOW (ref 8.9–10.3)
Chloride: 102 mmol/L (ref 98–111)
Creatinine, Ser: 1.08 mg/dL — ABNORMAL HIGH (ref 0.44–1.00)
GFR, Estimated: 50 mL/min — ABNORMAL LOW (ref >60)
Glucose, Bld: 99 mg/dL (ref 70–99)
Potassium: 3.2 mmol/L — ABNORMAL LOW (ref 3.5–5.1)
Sodium: 141 mmol/L (ref 135–145)

## 2020-05-18 LAB — CBC
HCT: 35 % — ABNORMAL LOW (ref 36.0–46.0)
Hemoglobin: 11.7 g/dL — ABNORMAL LOW (ref 12.0–15.0)
MCH: 30.4 pg (ref 26.0–34.0)
MCHC: 33.4 g/dL (ref 30.0–36.0)
MCV: 90.9 fL (ref 80.0–100.0)
Platelets: 289 10*3/uL (ref 150–400)
RBC: 3.85 MIL/uL — ABNORMAL LOW (ref 3.87–5.11)
RDW: 13.9 % (ref 11.5–15.5)
WBC: 5.7 10*3/uL (ref 4.0–10.5)
nRBC: 0 % (ref 0.0–0.2)

## 2020-05-18 LAB — T4, FREE: Free T4: 2.19 ng/dL — ABNORMAL HIGH (ref 0.61–1.12)

## 2020-05-18 LAB — VITAMIN D 25 HYDROXY (VIT D DEFICIENCY, FRACTURES): Vit D, 25-Hydroxy: 58.34 ng/mL (ref 30–100)

## 2020-05-18 LAB — MAGNESIUM: Magnesium: 2.2 mg/dL (ref 1.7–2.4)

## 2020-05-18 LAB — SARS CORONAVIRUS 2 (TAT 6-24 HRS): SARS Coronavirus 2: NEGATIVE

## 2020-05-18 MED ORDER — LIOTHYRONINE SODIUM 5 MCG PO TABS
5.0000 ug | ORAL_TABLET | Freq: Every day | ORAL | Status: DC
  Administered 2020-05-19 – 2020-05-22 (×3): 5 ug via ORAL
  Filled 2020-05-18 (×4): qty 1

## 2020-05-18 MED ORDER — LEVOTHYROXINE SODIUM 100 MCG PO TABS
100.0000 ug | ORAL_TABLET | Freq: Every day | ORAL | Status: DC
  Administered 2020-05-19 – 2020-05-22 (×4): 100 ug via ORAL
  Filled 2020-05-18 (×4): qty 1

## 2020-05-18 MED ORDER — POTASSIUM CHLORIDE CRYS ER 20 MEQ PO TBCR
40.0000 meq | EXTENDED_RELEASE_TABLET | Freq: Once | ORAL | Status: AC
  Administered 2020-05-18: 40 meq via ORAL
  Filled 2020-05-18: qty 2

## 2020-05-18 MED ORDER — SODIUM CHLORIDE 0.9 % IV SOLN: INTRAVENOUS | Status: DC

## 2020-05-18 MED ORDER — POTASSIUM CHLORIDE CRYS ER 20 MEQ PO TBCR
20.0000 meq | EXTENDED_RELEASE_TABLET | Freq: Once | ORAL | Status: AC
  Administered 2020-05-18: 20 meq via ORAL
  Filled 2020-05-18: qty 1

## 2020-05-18 NOTE — Evaluation (Signed)
Occupational Therapy Evaluation Patient Details Name: [name] MRN: [id number] DOB: [birth year] Today's Date: 05/18/2020    History of Present Illness [name] is a 87 y.o. female with medical history significant for hypothyroid secondary to thyroidectomy, thyroid nodule status post thyroidectomy (01/2020), on levothyroxine and liothyronine T3, nonhodgkin's lymphoma and completed chemotherapy about 10 years ago,  presents emergency department for chief concerns of frequent fall.  She fell on Tuesday morning, 05/16/20.  She denies remembering whether she felt lightheaded, overt chest pain, shortness of breath, dizziness, palpitations.   Clinical Impression   Patient presenting with decreased I in self care, balance, functional mobility/transfers, endurance, and safety awareness. Patient reports living alone in an apartment at mod I level PTA. Pt endorses using RW, cane, and rollator for functional mobility and uses devices based on how she feels. She uses rollator in community always. Patient currently functioning at min guard for functional mobility and standing ADLs without use of AD. Pt is very pleasant and agreeable to OT intervention.  Patient will benefit from acute OT to increase overall independence in the areas of ADLs, functional mobility, and safety awareness in order to safely discharge to next venue of care.    Follow Up Recommendations  Home health OT , intermittent supervision( self care tasks and mobility)   Equipment Recommendations  None recommended by OT       Precautions / Restrictions Precautions Precautions: Fall Restrictions Weight Bearing Restrictions: No      Mobility Bed Mobility Overal bed mobility: Modified Independent             General bed mobility comments: no physical assistance but HOB elevated    Transfers Overall transfer level: Needs assistance Equipment used: None Transfers: Sit to/from Stand Sit to Stand: Min guard               Balance Overall balance assessment: Needs assistance Sitting-balance support: Feet supported Sitting balance-Leahy Scale: Good     Standing balance support: Bilateral upper extremity supported;During functional activity Standing balance-Leahy Scale: Fair Standing balance comment: unsteady when she does not have B UE support, requiring min assist for ambulating a few feet to bathroom.                           ADL either performed or assessed with clinical judgement   ADL Overall ADL's : Needs assistance/impaired     Grooming: Wash/dry hands;Wash/dry face;Oral care;Standing;Min guard       Lower Body Bathing: Min guard;Sit to/from stand Lower Body Bathing Details (indicate cue type and reason): Pt standing to wash peri area and buttocks     Lower Body Dressing: Min guard;Cueing for safety;Sit to/from stand                       Vision Patient Visual Report: No change from baseline              Pertinent Vitals/Pain Pain Assessment: Faces Faces Pain Scale: Hurts a little bit Pain Location: R shoulder, face where bruised Pain Descriptors / Indicators: Sore Pain Intervention(s): Monitored during session     Hand Dominance Right   Extremity/Trunk Assessment Upper Extremity Assessment Upper Extremity Assessment: Generalized weakness   Lower Extremity Assessment Lower Extremity Assessment: Generalized weakness   Cervical / Trunk Assessment Cervical / Trunk Exceptions: forward head/shoulders   Communication Communication Communication: No difficulties   Cognition Arousal/Alertness: Awake/alert Behavior During Therapy: WFL for tasks assessed/performed  Overall Cognitive Status: Within Functional Limits for tasks assessed                                 General Comments: Does not remember what led to fall. Pt A & O x4              Home Living Family/patient expects to be discharged to:: Private residence Living  Arrangements: Alone Available Help at Discharge: Family;Available PRN/intermittently Type of Home: Apartment Home Access: Elevator     Home Layout: One level     Bathroom Shower/Tub: Teacher, early years/pre: Standard     Home Equipment: Grab bars - tub/shower;Grab bars - toilet;Walker - 2 wheels;Walker - 4 wheels;Cane - single point          Prior Functioning/Environment Level of Independence: Independent with assistive device(s);Needs assistance  Gait / Transfers Assistance Needed: Patient ambulates with cane, RW, or rollator. She reports using rollator in community but uses any one of them depending on how she feels ADL's / Homemaking Assistance Needed: Pt reports performing all self care tasks with use of AD as needed. Pt reports getting into tub for bathing.   Comments: Multiple falls at home and does not remember this last one.        OT Problem List: Decreased strength;Impaired balance (sitting and/or standing);Decreased safety awareness;Decreased knowledge of use of DME or AE;Decreased activity tolerance      OT Treatment/Interventions: Self-care/ADL training;DME and/or AE instruction;Therapeutic activities;Balance training;Therapeutic exercise;Manual therapy;Modalities;Energy conservation;Patient/family education    OT Goals(Current goals can be found in the care plan section) Acute Rehab OT Goals Patient Stated Goal: to go home OT Goal Formulation: With patient Time For Goal Achievement: 06/01/20 Potential to Achieve Goals: Good ADL Goals Pt Will Perform Grooming: with modified independence Pt Will Perform Lower Body Dressing: with modified independence Pt Will Transfer to Toilet: with modified independence Pt Will Perform Toileting - Clothing Manipulation and hygiene: with modified independence Pt Will Perform Tub/Shower Transfer: with modified independence;ambulating;shower seat;rolling walker  OT Frequency: Min 2X/week    AM-PAC OT "6 Clicks"  Daily Activity     Outcome Measure   Help from another person taking care of personal grooming?: A Little Help from another person toileting, which includes using toliet, bedpan, or urinal?: A Little Help from another person bathing (including washing, rinsing, drying)?: A Little Help from another person to put on and taking off regular upper body clothing?: None Help from another person to put on and taking off regular lower body clothing?: A Little 6 Click Score: 16   End of Session Nurse Communication: Mobility status  Activity Tolerance:   Patient left: in bed;with call bell/phone within reach  OT Visit Diagnosis: Unsteadiness on feet (R26.81);Repeated falls (R29.6)                Time: 9892-1194 OT Time Calculation (min): 31 min Charges:  OT General Charges $OT Visit: 1 Visit OT Evaluation $OT Eval Low Complexity: 1 Low OT Treatments $Self Care/Home Management : 23-37 mins  Darleen Crocker, MS, OTR/L , CBIS ascom [phone]  05/18/20, 12:54 PM

## 2020-05-18 NOTE — Evaluation (Signed)
Physical Therapy Evaluation Patient Details Name: [name] MRN: [id number] DOB: May 27, 1932 Today's Date: 05/18/2020   History of Present Illness  [name] is a 87 y.o. female with medical history significant for hypothyroid secondary to thyroidectomy, thyroid nodule status post thyroidectomy (01/2020), on levothyroxine and liothyronine T3, nonhodgkin's lymphoma and completed chemotherapy about 10 years ago,  presents emergency department for chief concerns of frequent fall.  She fell on Tuesday morning, 05/16/20.  She denies remembering whether she felt lightheaded, overt chest pain, shortness of breath, dizziness, palpitations.  Clinical Impression  Patient received in bed, she has questions about why I'm wearing gown and door hanger at room. Patient is alert, conversational. Patient is mod independent with bed mobility, patient transfers with min guard and ambulated with RW 200 feet. She ambulated in/out of bathroom with min guard, no AD. Patient will continue to benefit from skilled PT while here to improve safety and functional independence. She will benefit from 24 hour assist at home and HHPT to improve balance and safety and to prevent falls.          Follow Up Recommendations Home health PT;Supervision for mobility/OOB    Equipment Recommendations  None recommended by PT    Recommendations for Other Services       Precautions / Restrictions Precautions Precautions: Fall Restrictions Weight Bearing Restrictions: No      Mobility  Bed Mobility Overal bed mobility: Modified Independent             General bed mobility comments: Semi-supine to sitting edge of bed without any noted difficulties; HOB elevated.    Transfers Overall transfer level: Needs assistance Equipment used: None Transfers: Sit to/from Stand Sit to Stand: Min guard            Ambulation/Gait Ambulation/Gait assistance: Min guard Gait Distance (Feet): 200 Feet Assistive device:  Rolling walker (2 wheeled) Gait Pattern/deviations: Step-through pattern;WFL(Within Functional Limits) Gait velocity: WNL   General Gait Details: steady ambulation with RW use  Stairs            Wheelchair Mobility    Modified Rankin (Stroke Patients Only)       Balance Overall balance assessment: Needs assistance Sitting-balance support: Feet supported Sitting balance-Leahy Scale: Good     Standing balance support: Bilateral upper extremity supported;During functional activity Standing balance-Leahy Scale: Good Standing balance comment: unsteady when she does not have B UE support, requiring min assist for ambulating a few feet to bathroom.                             Pertinent Vitals/Pain Pain Assessment: Faces Faces Pain Scale: Hurts a little bit Pain Location: R shoulder, face where bruised Pain Descriptors / Indicators: Sore Pain Intervention(s): Monitored during session    Home Living Family/patient expects to be discharged to:: Private residence Living Arrangements: Alone Available Help at Discharge: Family;Available PRN/intermittently Type of Home: Apartment Home Access: Elevator     Home Layout: One level Home Equipment: Grab bars - tub/shower;Grab bars - toilet;Walker - 2 wheels;Walker - 4 wheels;Cane - single point      Prior Function Level of Independence: Independent with assistive device(s);Needs assistance   Gait / Transfers Assistance Needed: Patient ambulates with cane or rw, not sure that she is actually using either one while at home     Comments: Pt ambulates with SPC vs RW vs rollator depending on how she feels.  H/o falls (R shoulder  pain since fall after thyroid surgery 10/2019).     Hand Dominance        Extremity/Trunk Assessment   Upper Extremity Assessment Upper Extremity Assessment: Generalized weakness    Lower Extremity Assessment Lower Extremity Assessment: Generalized weakness    Cervical / Trunk  Assessment Cervical / Trunk Exceptions: forward head/shoulders  Communication   Communication: No difficulties  Cognition Arousal/Alertness: Awake/alert Behavior During Therapy: WFL for tasks assessed/performed Overall Cognitive Status: Within Functional Limits for tasks assessed                                 General Comments: Does not remember what led to fall      General Comments      Exercises     Assessment/Plan    PT Assessment Patient needs continued PT services  PT Problem List Decreased mobility;Decreased balance;Decreased safety awareness       PT Treatment Interventions DME instruction;Gait training;Functional mobility training;Therapeutic activities;Therapeutic exercise;Balance training;Patient/family education    PT Goals (Current goals can be found in the Care Plan section)  Acute Rehab PT Goals Patient Stated Goal: to go home PT Goal Formulation: With patient Time For Goal Achievement: 06/01/20 Potential to Achieve Goals: Good    Frequency Min 2X/week   Barriers to discharge Decreased caregiver support lives alone    Co-evaluation               AM-PAC PT "6 Clicks" Mobility  Outcome Measure Help needed turning from your back to your side while in a flat bed without using bedrails?: None Help needed moving from lying on your back to sitting on the side of a flat bed without using bedrails?: None Help needed moving to and from a bed to a chair (including a wheelchair)?: A Little Help needed standing up from a chair using your arms (e.g., wheelchair or bedside chair)?: A Little Help needed to walk in hospital room?: A Little Help needed climbing 3-5 steps with a railing? : A Little 6 Click Score: 20    End of Session Equipment Utilized During Treatment: Gait belt Activity Tolerance: Patient tolerated treatment well Patient left: with bed alarm set;in bed;with call bell/phone within reach;with SCD's reapplied Nurse  Communication: Mobility status;Precautions PT Visit Diagnosis: Other abnormalities of gait and mobility (R26.89);Unsteadiness on feet (R26.81);Repeated falls (R29.6)    Time: 0920-0950 PT Time Calculation (min) (ACUTE ONLY): 30 min   Charges:   PT Evaluation $PT Eval Moderate Complexity: 1 Mod PT Treatments $Gait Training: 8-22 mins        Terin Dierolf, PT, GCS 05/18/20,10:11 AM

## 2020-05-18 NOTE — Progress Notes (Signed)
PROGRESS NOTE    [name]  HQI:[id number] DOB: 03-Mar-1932 DOA: 05/17/2020 PCP: Tracie Harrier, MD   Brief Narrative: 87-year-old with past medical history significant for hypothyroidism secondary to thyroidectomy, thyroid nodule, non-Hodgkin lymphoma completed therapy 10 years ago, who presents after frequent fall.  Patient fell on Tuesday morning 05/16/2020.  She does not remember how she fell.  She fell back water heater head also she has left thigh hematoma. Evaluation in the ED CT head with no acute abnormality. 135, creatinine 1.1, TSH 0.07 UA negative  Assessment & Plan:   Principal Problem:   Frequent falls Active Problems:   HLD (hyperlipidemia)   Anxiety   Chronic kidney disease (CKD), stage III (moderate) (HCC)   S/P complete thyroidectomy   Hypothyroidism   Hypertension   Hypokalemia  1-Weakness and frequent fall;  PT OT evaluation patient will require supervision UDS negative, vitamin D normal B12 Her thyroid free T4 was elevated, will adjust her medication She received IV fluids continue  2-hypothyroidism: TSH low and free T4 elevated.  Plan to hold thyroid medications for today and start Synthroid tomorrow at 100 mcg  3-right-sided shoulder pain: X-ray negative for fracture  4-Left eye ecchymosis: CT head negative  5-Hyperlipidemia continue with lovastatin 6-History  urinary retention continue with oxybutynin  7-GERD continue with pantoprazole 8-Restless leg syndrome continue with ropinirole 9-Depression; continue with duloxetin.      Estimated body mass index is 29.99 kg/m as calculated from the following:   Height as of this encounter: 5\' 1"  (1.549 m).   Weight as of this encounter: 72 kg.   DVT prophylaxis: Heparin  Code Status: Full code Family Communication: Daughter Disposition Plan:  Status is: Observation  The patient remains OBS appropriate and will d/c before 2 midnights.  Dispo: The patient is from: Home               Anticipated d/c is to: to be determine              Patient currently is not medically stable to d/c.   Difficult to place patient No        Consultants:   None  Procedures:   none  Antimicrobials:    Subjective: She denies headaches. She has mild tremors.    Objective: Vitals:   05/17/20 2030 05/17/20 2100 05/17/20 2317 05/18/20 0506  BP: (!) 107/52 106/63 (!) 101/51 (!) 112/56  Pulse: 72 73 67 89  Resp: 20 17 18 18   Temp:   97.9 F (36.6 C) 98.6 F (37 C)  TempSrc:   Oral Oral  SpO2:  98% 100% 97%  Weight:      Height:       No intake or output data in the 24 hours ending 05/18/20 0735 Filed Weights   05/17/20 1533  Weight: 72 kg    Examination:  General exam: Appears calm and comfortable  Respiratory system: Clear to auscultation. Respiratory effort normal. Cardiovascular system: S1 & S2 heard, RRR.  Gastrointestinal system: Abdomen is nondistended, soft and nontender. No organomegaly or masses felt. Normal bowel sounds heard. Central nervous system: Alert and oriented. Extremities: Symmetric 5 x 5 power.   Data Reviewed: I have personally reviewed following labs and imaging studies  CBC: Recent Labs  Lab 05/17/20 1609 05/18/20 0453  WBC 7.2 5.7  HGB 13.0 11.7*  HCT 38.8 35.0*  MCV 90.9 90.9  PLT 328 132   Basic Metabolic Panel: Recent Labs  Lab 05/17/20 1609 05/18/20 0453  NA  135 141  K 2.9* 3.2*  CL 95* 102  CO2 29 28  GLUCOSE 124* 99  BUN 28* 25*  CREATININE 1.14* 1.08*  CALCIUM 9.2 8.7*  MG  --  2.2   GFR: Estimated Creatinine Clearance: 34.6 mL/min (A) (by C-G formula based on SCr of 1.08 mg/dL (H)). Liver Function Tests: No results for input(s): AST, ALT, ALKPHOS, BILITOT, PROT, ALBUMIN in the last 168 hours. No results for input(s): LIPASE, AMYLASE in the last 168 hours. No results for input(s): AMMONIA in the last 168 hours. Coagulation Profile: No results for input(s): INR, PROTIME in the last 168 hours. Cardiac  Enzymes: No results for input(s): CKTOTAL, CKMB, CKMBINDEX, TROPONINI in the last 168 hours. BNP (last 3 results) No results for input(s): PROBNP in the last 8760 hours. HbA1C: No results for input(s): HGBA1C in the last 72 hours. CBG: No results for input(s): GLUCAP in the last 168 hours. Lipid Profile: No results for input(s): CHOL, HDL, LDLCALC, TRIG, CHOLHDL, LDLDIRECT in the last 72 hours. Thyroid Function Tests: Recent Labs    05/17/20 2032 05/18/20 0453  TSH 0.077*  --   FREET4  --  2.19*   Anemia Panel: No results for input(s): VITAMINB12, FOLATE, FERRITIN, TIBC, IRON, RETICCTPCT in the last 72 hours. Sepsis Labs: No results for input(s): PROCALCITON, LATICACIDVEN in the last 168 hours.  No results found for this or any previous visit (from the past 240 hour(s)).       Radiology Studies: DG Shoulder Right  Result Date: 05/17/2020 CLINICAL DATA:  87 year old female with fall and right shoulder pain. EXAM: RIGHT SHOULDER - 2+ VIEW COMPARISON:  Chest radiograph dated 0 05/17/2020 FINDINGS: There is no acute fracture or dislocation. The bones are osteopenic. There is chronic widening of the [facility] joint measuring approximately 7 mm. The soft tissues are unremarkable. IMPRESSION: No acute fracture or dislocation. Electronically Signed   By: Anner Crete M.D.   On: 05/17/2020 22:41   CT HEAD WO CONTRAST  Result Date: 05/17/2020 CLINICAL DATA:  Head injury after fall. EXAM: CT HEAD WITHOUT CONTRAST TECHNIQUE: Contiguous axial images were obtained from the base of the skull through the vertex without intravenous contrast. COMPARISON:  April 28, 2020. FINDINGS: Brain: Mild chronic ischemic white matter disease is noted. No mass effect or midline shift is noted. Ventricular size is within normal limits. There is no evidence of mass lesion, hemorrhage or acute infarction. Vascular: No hyperdense vessel or unexpected calcification. Skull: Normal. Negative for fracture or focal lesion.  Sinuses/Orbits: No acute finding. Other: None. IMPRESSION: Mild chronic ischemic white matter disease. No acute intracranial abnormality seen. Electronically Signed   By: Marijo Conception M.D.   On: 05/17/2020 16:02   DG Chest Port 1 View  Result Date: 05/17/2020 CLINICAL DATA:  Fall for the last 2 days EXAM: PORTABLE CHEST 1 VIEW COMPARISON:  February 15, 2020 FINDINGS: The heart size and mediastinal contours are within normal limits. Both lungs are clear. The visualized skeletal structures are unremarkable. IMPRESSION: No active disease. Electronically Signed   By: Prudencio Pair M.D.   On: 05/17/2020 22:42        Scheduled Meds: . DULoxetine  20 mg Oral Daily  . furosemide  20 mg Oral Daily  . heparin  5,000 Units Subcutaneous Q8H  . liothyronine  5 mcg Oral Daily  . oxybutynin  5 mg Oral TID  . pantoprazole  40 mg Oral Daily  . pravastatin  40 mg Oral q1800  . rOPINIRole  2 mg Oral BID   Continuous Infusions:   LOS: 0 days    Time spent: 35 minutes    Lyvonne Cassell A Gurleen Larrivee, MD [facility]   If 7PM-7AM, please contact night-coverage [email or web address]  05/18/2020, 7:35 AM

## 2020-05-18 NOTE — Care Management Obs Status (Signed)
[facility] NOTIFICATION   Patient Details  Name: [name] MRN: [id number] Date of Birth: 1932/06/27   Medicare Observation Status Notification Given:  Yes    Beverly Sessions, RN 05/18/2020, 4:26 PM

## 2020-05-18 NOTE — TOC Initial Note (Signed)
Transition of Care [facility]) - Initial/Assessment Note    Patient Details  Name: [name] MRN: [id number] Date of Birth: [birth year]  Transition of Care [facility]) CM/SW Contact:    Beverly Sessions, RN Phone Number: 05/18/2020, 4:33 PM  Clinical Narrative:                  Patient admitted from home with falls Patient lives at home alone Sister and daughter live locally for support and available for transportation  [place] [facility] - denies issues obtaining medications  PT has assessed patient and recommends home health Patient agreeable and states she does not have a preference of home health agency.  Referral made to [facility] with [place]  Expected Discharge Plan: [place] Barriers to Discharge: Continued Medical Work up   Patient Goals and CMS Choice        Expected Discharge Plan and Services Expected Discharge Plan: [place]       Living arrangements for the past 2 months: Apartment                           HH Arranged: PT,OT [place] Agency: [place] Date HH Agency Contacted: 05/18/20   Representative spoke with at [place]: Corene Cornea  Prior Living Arrangements/Services Living arrangements for the past 2 months: Apartment Lives with:: Self Patient language and need for interpreter reviewed:: Yes Do you feel safe going back to the place where you live?: Yes      Need for Family Participation in Patient Care: Yes (Comment) Care giver support system in place?: Yes (comment) Current home services: DME Criminal Activity/Legal Involvement Pertinent to Current Situation/Hospitalization: No - Comment as needed  Activities of Daily Living Home Assistive Devices/Equipment: Cane (specify quad or straight) ADL Screening (condition at time of admission) Patient's cognitive ability adequate to safely complete daily activities?: Yes Is the patient deaf or have difficulty hearing?: No Does the  patient have difficulty seeing, even when wearing glasses/contacts?: No Does the patient have difficulty concentrating, remembering, or making decisions?: No Patient able to express need for assistance with ADLs?: Yes Does the patient have difficulty dressing or bathing?: No Independently performs ADLs?: Yes (appropriate for developmental age) Does the patient have difficulty walking or climbing stairs?: Yes Weakness of Legs: Both Weakness of Arms/Hands: None  Permission Sought/Granted                  Emotional Assessment       Orientation: : Oriented to Place,Oriented to Self,Oriented to  Time,Oriented to Situation      Admission diagnosis:  Shortness of breath [R06.02] Hypokalemia [E87.6] Weakness [R53.1] Right shoulder pain [M25.511] Frequent falls [R29.6] Patient Active Problem List   Diagnosis Date Noted  . Hypokalemia 05/17/2020  . Frequent falls 05/17/2020  . AMS (altered mental status) 04/28/2020  . Hypothyroidism   . Hypertension   . Coronary artery disease   . Congestive heart failure (CHF) ([place])   . Major depressive disorder, recurrent, in remission ([place]) 04/14/2020  . S/P complete thyroidectomy 11/17/2019  . Acute cystitis without hematuria 09/28/2019  . Varicose veins of leg with pain, bilateral 08/10/2019  . Swelling of limb 06/01/2019  . Leg pain 03/10/2019  . History of lymphoma 03/08/2019  . Vascular disease 11/25/2018  . Chronic obstructive pulmonary disease ([place]) 05/22/2018  . Incisional hernia 05/08/2018  . Gastro-esophageal reflux disease without esophagitis 07/24/2017  .  Left carpal tunnel syndrome 03/29/2016  . Varicose veins of leg with pain, left 01/23/2016  . Gross hematuria 03/28/2015  . Nephrolithiasis 03/28/2015  . Chronic kidney disease (CKD), stage III (moderate) ([place]) 03/23/2015  . Pneumonia 02/10/2015  . HLD (hyperlipidemia) 07/29/2014  . Clinical depression 07/29/2014  . Arteriosclerosis of coronary artery 07/29/2014  .  Anxiety 07/29/2014  . Diverticulitis 07/23/2014  . Edema leg 07/08/2014  . Acute bronchitis due to infection 01/24/2014  . Lumbar canal stenosis 12/13/2013  . Neuritis or radiculitis due to rupture of lumbar intervertebral disc 12/13/2013  . Lumbar radiculitis 12/13/2013   PCP:  Tracie Harrier, MD Pharmacy:   [id number] [address], [place] [address] Phone: [phone] Fax: [phone]     Social Determinants of Health (SDOH) Interventions    Readmission Risk Interventions No flowsheet data found.

## 2020-05-19 ENCOUNTER — Observation Stay: Payer: Medicare Other

## 2020-05-19 DIAGNOSIS — R296 Repeated falls: Secondary | ICD-10-CM | POA: Diagnosis not present

## 2020-05-19 LAB — URINE CULTURE: Culture: NO GROWTH

## 2020-05-19 LAB — BASIC METABOLIC PANEL
Anion gap: 8 (ref 5–15)
BUN: 18 mg/dL (ref 8–23)
CO2: 25 mmol/L (ref 22–32)
Calcium: 8.6 mg/dL — ABNORMAL LOW (ref 8.9–10.3)
Chloride: 102 mmol/L (ref 98–111)
Creatinine, Ser: 0.99 mg/dL (ref 0.44–1.00)
GFR, Estimated: 56 mL/min — ABNORMAL LOW (ref >60)
Glucose, Bld: 113 mg/dL — ABNORMAL HIGH (ref 70–99)
Potassium: 3.8 mmol/L (ref 3.5–5.1)
Sodium: 135 mmol/L (ref 135–145)

## 2020-05-19 LAB — T4, FREE: Free T4: 1.66 ng/dL — ABNORMAL HIGH (ref 0.61–1.12)

## 2020-05-19 LAB — T4: T4, Total: 13.7 ug/dL — ABNORMAL HIGH (ref 4.5–12.0)

## 2020-05-19 MED ORDER — DICLOFENAC SODIUM 1 % EX GEL
2.0000 g | Freq: Four times a day (QID) | CUTANEOUS | Status: DC
  Administered 2020-05-19 – 2020-05-22 (×9): 2 g via TOPICAL
  Filled 2020-05-19: qty 100

## 2020-05-19 MED ORDER — ACETAMINOPHEN 325 MG PO TABS: 325.0000 mg | ORAL_TABLET | Freq: Four times a day (QID) | ORAL | 0 refills | Status: DC | PRN

## 2020-05-19 MED ORDER — DICLOFENAC SODIUM 1 % EX GEL: 2.0000 g | Freq: Four times a day (QID) | CUTANEOUS | 1 refills | Status: DC

## 2020-05-19 MED ORDER — LEVOTHYROXINE SODIUM 100 MCG PO TABS: 100.0000 ug | ORAL_TABLET | Freq: Every day | ORAL | 2 refills | Status: DC

## 2020-05-19 MED ORDER — TRAMADOL HCL 50 MG PO TABS
50.0000 mg | ORAL_TABLET | Freq: Two times a day (BID) | ORAL | Status: DC | PRN
  Administered 2020-05-19 (×2): 50 mg via ORAL
  Filled 2020-05-19 (×2): qty 1

## 2020-05-19 NOTE — Progress Notes (Signed)
PROGRESS NOTE    [name]  WNI:[id number] DOB: [birth year] DOA: 05/17/2020 PCP: Tracie Harrier, MD   Brief Narrative: 87-year-old with past medical history significant for hypothyroidism secondary to thyroidectomy, thyroid nodule, non-Hodgkin lymphoma completed therapy 10 years ago, who presents after frequent fall.  Patient fell on Tuesday morning 05/16/2020.  She does not remember how she fell.  She fell back water heater head also she has left thigh hematoma. Evaluation in the ED CT head with no acute abnormality. 135, creatinine 1.1, TSH 0.07 UA negative  Assessment & Plan:   Principal Problem:   Frequent falls Active Problems:   HLD (hyperlipidemia)   Anxiety   Chronic kidney disease (CKD), stage III (moderate) (HCC)   S/P complete thyroidectomy   Hypothyroidism   Hypertension   Hypokalemia  1-Weakness and frequent fall;  PT OT evaluation patient will require supervision UDS negative, vitamin D normal B-12; 713. Her thyroid free T4 was elevated, will adjust her medication. Synthroid reduce to 100 mcg.  She received IV fluids  HH PT>   2-hypothyroidism: TSH low and free T4 elevated.   Held synthroid for one day. Reduce dose to 100 mcg  3-right-sided shoulder pain: X-ray negative for fracture Pain persist, worse, unable to move arm well.  Plan to get MRI shoulder to further evaluate   4-Left eye ecchymosis: CT head negative  5-Hyperlipidemia continue with lovastatin 6-History  urinary retention continue with oxybutynin  7-GERD continue with pantoprazole 8-Restless leg syndrome continue with ropinirole 9-Depression; continue with duloxetin.      Estimated body mass index is 29.99 kg/m as calculated from the following:   Height as of this encounter: 5\' 1"  (1.549 m).   Weight as of this encounter: 72 kg.   DVT prophylaxis: Heparin  Code Status: Full code Family Communication: Daughter over phone and sister who was at bedside.  Disposition Plan:   Status is: Observation  The patient remains OBS appropriate and will d/c before 2 midnights.  Dispo: The patient is from: Home              Anticipated d/c is to: to be determine              Patient currently is not medically stable to d/c.   Difficult to place patient No        Consultants:   None  Procedures:   none  Antimicrobials:    Subjective: She is not feeling well. Complaining of shoulder pain worse since the fall. , unable to move arm.   Objective: Vitals:   05/18/20 2019 05/19/20 0331 05/19/20 0936 05/19/20 1052  BP: 135/76 (!) 122/58 (!) 94/54 (!) 127/95  Pulse: 98 (!) 108 82 84  Resp: 20 20 18 16   Temp: 98.7 F (37.1 C) 99.3 F (37.4 C) 98.3 F (36.8 C) 99.4 F (37.4 C)  TempSrc: Oral Oral Oral   SpO2: 100% 96% 99% 96%  Weight:      Height:        Intake/Output Summary (Last 24 hours) at 05/19/2020 1419 Last data filed at 05/19/2020 1146 Gross per 24 hour  Intake 1580.58 ml  Output --  Net 1580.58 ml   Filed Weights   05/17/20 1533  Weight: 72 kg    Examination:  General exam: NAD Respiratory system: CTA Cardiovascular system: S 1, S 2 RRR Gastrointestinal system: BS present, soft. , nt Central nervous system: alert Extremities no edema   Data Reviewed: I have personally reviewed following labs and imaging  studies  CBC: Recent Labs  Lab 05/17/20 1609 05/18/20 0453  WBC 7.2 5.7  HGB 13.0 11.7*  HCT 38.8 35.0*  MCV 90.9 90.9  PLT 328 062   Basic Metabolic Panel: Recent Labs  Lab 05/17/20 1609 05/18/20 0453 05/19/20 0807  NA 135 141 135  K 2.9* 3.2* 3.8  CL 95* 102 102  CO2 29 28 25   GLUCOSE 124* 99 113*  BUN 28* 25* 18  CREATININE 1.14* 1.08* 0.99  CALCIUM 9.2 8.7* 8.6*  MG  --  2.2  --    GFR: Estimated Creatinine Clearance: 37.7 mL/min (by C-G formula based on SCr of 0.99 mg/dL). Liver Function Tests: No results for input(s): AST, ALT, ALKPHOS, BILITOT, PROT, ALBUMIN in the last 168 hours. No results  for input(s): LIPASE, AMYLASE in the last 168 hours. No results for input(s): AMMONIA in the last 168 hours. Coagulation Profile: No results for input(s): INR, PROTIME in the last 168 hours. Cardiac Enzymes: No results for input(s): CKTOTAL, CKMB, CKMBINDEX, TROPONINI in the last 168 hours. BNP (last 3 results) No results for input(s): PROBNP in the last 8760 hours. HbA1C: No results for input(s): HGBA1C in the last 72 hours. CBG: No results for input(s): GLUCAP in the last 168 hours. Lipid Profile: No results for input(s): CHOL, HDL, LDLCALC, TRIG, CHOLHDL, LDLDIRECT in the last 72 hours. Thyroid Function Tests: Recent Labs    05/17/20 1609 05/17/20 2032 05/18/20 0453 05/19/20 0459  TSH  --  0.077*  --   --   T4TOTAL 13.7*  --   --   --   FREET4  --   --    < > 1.66*   < > = values in this interval not displayed.   Anemia Panel: No results for input(s): VITAMINB12, FOLATE, FERRITIN, TIBC, IRON, RETICCTPCT in the last 72 hours. Sepsis Labs: No results for input(s): PROCALCITON, LATICACIDVEN in the last 168 hours.  Recent Results (from the past 240 hour(s))  Urine culture     Status: None   Collection Time: 05/17/20  4:09 PM   Specimen: Urine, Random  Result Value Ref Range Status   Specimen Description   Final    URINE, RANDOM Performed at [facility], [address], [place], [place] [address]    Special Requests   Final    NONE Performed at [facility], [address] [place], [place] [address]    Culture   Final    NO GROWTH Performed at [facility], [place] [address], [place], [place] [address]    Report Status 05/19/2020 FINAL  Final  SARS CORONAVIRUS 2 (TAT 6-24 HRS) Nasopharyngeal Nasopharyngeal Swab     Status: None   Collection Time: 05/17/20  8:33 PM   Specimen: Nasopharyngeal Swab  Result Value Ref Range Status   SARS Coronavirus 2 NEGATIVE NEGATIVE Final    Comment: (NOTE) SARS-CoV-2 target nucleic acids are NOT  DETECTED.  The SARS-CoV-2 RNA is generally detectable in upper and lower respiratory specimens during the acute phase of infection. Negative results do not preclude SARS-CoV-2 infection, do not rule out co-infections with other pathogens, and should not be used as the sole basis for treatment or other patient management decisions. Negative results must be combined with clinical observations, patient history, and epidemiological information. The expected result is Negative.  Fact Sheet for Patients: SugarRoll.be  Fact Sheet for Healthcare Providers: https://www.woods-mathews.com/  This test is not yet approved or cleared by the [place] FDA and  has been authorized for detection and/or diagnosis of SARS-CoV-2 by FDA under an Emergency Use Authorization (EUA). This EUA will remain  in effect (meaning this test can be used) for the duration of the COVID-19 declaration under Se ction 564(b)(1) of the Act, 21 U.S.C. section 360bbb-3(b)(1), unless the authorization is terminated or revoked sooner.  Performed at [facility], [place] [address] [place], [place] [address]          Radiology Studies: DG Shoulder Right  Result Date: 05/17/2020 CLINICAL DATA:  87 year old female with fall and right shoulder pain. EXAM: RIGHT SHOULDER - 2+ VIEW COMPARISON:  Chest radiograph dated 0 05/17/2020 FINDINGS: There is no acute fracture or dislocation. The bones are osteopenic. There is chronic widening of the [facility] joint measuring approximately 7 mm. The soft tissues are unremarkable. IMPRESSION: No acute fracture or dislocation. Electronically Signed   By: Anner Crete M.D.   On: 05/17/2020 22:41   CT HEAD WO CONTRAST  Result Date: 05/17/2020 CLINICAL DATA:  Head injury after fall. EXAM: CT HEAD WITHOUT CONTRAST TECHNIQUE: Contiguous axial images were obtained from the base of the skull through the vertex without intravenous contrast.  COMPARISON:  April 28, 2020. FINDINGS: Brain: Mild chronic ischemic white matter disease is noted. No mass effect or midline shift is noted. Ventricular size is within normal limits. There is no evidence of mass lesion, hemorrhage or acute infarction. Vascular: No hyperdense vessel or unexpected calcification. Skull: Normal. Negative for fracture or focal lesion. Sinuses/Orbits: No acute finding. Other: None. IMPRESSION: Mild chronic ischemic white matter disease. No acute intracranial abnormality seen. Electronically Signed   By: Marijo Conception M.D.   On: 05/17/2020 16:02   DG Chest Port 1 View  Result Date: 05/17/2020 CLINICAL DATA:  Fall for the last 2 days EXAM: PORTABLE CHEST 1 VIEW COMPARISON:  February 15, 2020 FINDINGS: The heart size and mediastinal contours are within normal limits. Both lungs are clear. The visualized skeletal structures are unremarkable. IMPRESSION: No active disease. Electronically Signed   By: Prudencio Pair M.D.   On: 05/17/2020 22:42        Scheduled Meds: . diclofenac Sodium  2 g Topical QID  . DULoxetine  20 mg Oral Daily  . heparin  5,000 Units Subcutaneous Q8H  . levothyroxine  100 mcg Oral Q0600  . liothyronine  5 mcg Oral Daily  . oxybutynin  5 mg Oral TID  . pantoprazole  40 mg Oral Daily  . pravastatin  40 mg Oral q1800  . rOPINIRole  2 mg Oral BID   Continuous Infusions: . sodium chloride Stopped (05/19/20 1128)     LOS: 0 days    Time spent: 35 minutes    Kinya Meine A Katalena Malveaux, MD [facility]   If 7PM-7AM, please contact night-coverage [email or web address]  05/19/2020, 2:19 PM

## 2020-05-19 NOTE — Consult Note (Signed)
Full consult to follow.  Patient is an 87 year old female, s/p fall.   MRI demonstrates a full-thickness and retracted rotator cuff tear.  P There is nothing surgical to do acutely during this hospitalization for her injuries. Patient can have a sling for comfort and follow up as an outpatient. She has no restrictions on her shoulder and can use it as her pain allows. Patient would benefit from PT/OT evaluation.

## 2020-05-19 NOTE — Progress Notes (Signed)
PT Cancellation Note  Patient Details Name: [name] MRN: [id number] DOB: [birth year]   Cancelled Treatment:    Reason Eval/Treat Not Completed: Fatigue/lethargy limiting ability to participate. Patient sleeping soundly when entered room. Will re-attempt PT another day/time.    Nimesh Riolo 05/19/2020, 2:48 PM

## 2020-05-19 NOTE — TOC Transition Note (Addendum)
Transition of Care [facility]) - CM/SW Discharge Note   Patient Details  Name: CHARNE MCBRIEN MRN: [id number] Date of Birth: Jun 08, 1932  [facility]) CM/SW Contact:  Beverly Sessions, RN Phone Number: 05/19/2020, 1:31 PM   Clinical Narrative:     Patient to discharge home today.  Daughter to transport at discharge Corene Cornea with [place] notified of discharge Emailed daughter list of ALF, and PCS services for future reference   Start of care for home health Monday per Corene Cornea with [place]   Final next level of care: [place] Barriers to Discharge: No Barriers Identified   Patient Goals and CMS Choice        Discharge Placement                       Discharge Plan and Services                          HH Arranged: PT,OT,Nurse's Aide,Social Work [facility] Agency: [place]) Date [place]: 05/19/20   Representative spoke with at [place]: [place]) Interventions     Readmission Risk Interventions No flowsheet data found.

## 2020-05-20 ENCOUNTER — Inpatient Hospital Stay: Payer: Medicare Other

## 2020-05-20 DIAGNOSIS — E876 Hypokalemia: Secondary | ICD-10-CM | POA: Diagnosis present

## 2020-05-20 DIAGNOSIS — M25411 Effusion, right shoulder: Secondary | ICD-10-CM | POA: Diagnosis present

## 2020-05-20 DIAGNOSIS — W010XXA Fall on same level from slipping, tripping and stumbling without subsequent striking against object, initial encounter: Secondary | ICD-10-CM | POA: Diagnosis present

## 2020-05-20 DIAGNOSIS — R339 Retention of urine, unspecified: Secondary | ICD-10-CM | POA: Diagnosis present

## 2020-05-20 DIAGNOSIS — G2581 Restless legs syndrome: Secondary | ICD-10-CM | POA: Diagnosis present

## 2020-05-20 DIAGNOSIS — S0512XA Contusion of eyeball and orbital tissues, left eye, initial encounter: Secondary | ICD-10-CM | POA: Diagnosis present

## 2020-05-20 DIAGNOSIS — E785 Hyperlipidemia, unspecified: Secondary | ICD-10-CM | POA: Diagnosis present

## 2020-05-20 DIAGNOSIS — I2510 Atherosclerotic heart disease of native coronary artery without angina pectoris: Secondary | ICD-10-CM | POA: Diagnosis present

## 2020-05-20 DIAGNOSIS — G9341 Metabolic encephalopathy: Secondary | ICD-10-CM | POA: Diagnosis present

## 2020-05-20 DIAGNOSIS — E890 Postprocedural hypothyroidism: Secondary | ICD-10-CM | POA: Diagnosis present

## 2020-05-20 DIAGNOSIS — R296 Repeated falls: Secondary | ICD-10-CM | POA: Diagnosis present

## 2020-05-20 DIAGNOSIS — I5032 Chronic diastolic (congestive) heart failure: Secondary | ICD-10-CM | POA: Diagnosis present

## 2020-05-20 DIAGNOSIS — S7012XA Contusion of left thigh, initial encounter: Secondary | ICD-10-CM | POA: Diagnosis present

## 2020-05-20 DIAGNOSIS — F32A Depression, unspecified: Secondary | ICD-10-CM | POA: Diagnosis present

## 2020-05-20 DIAGNOSIS — R531 Weakness: Secondary | ICD-10-CM | POA: Diagnosis present

## 2020-05-20 DIAGNOSIS — N1832 Chronic kidney disease, stage 3b: Secondary | ICD-10-CM | POA: Diagnosis present

## 2020-05-20 DIAGNOSIS — Y92039 Unspecified place in apartment as the place of occurrence of the external cause: Secondary | ICD-10-CM | POA: Diagnosis not present

## 2020-05-20 DIAGNOSIS — Z8572 Personal history of non-Hodgkin lymphomas: Secondary | ICD-10-CM | POA: Diagnosis not present

## 2020-05-20 DIAGNOSIS — I130 Hypertensive heart and chronic kidney disease with heart failure and stage 1 through stage 4 chronic kidney disease, or unspecified chronic kidney disease: Secondary | ICD-10-CM | POA: Diagnosis present

## 2020-05-20 DIAGNOSIS — M19011 Primary osteoarthritis, right shoulder: Secondary | ICD-10-CM | POA: Diagnosis present

## 2020-05-20 DIAGNOSIS — Z20822 Contact with and (suspected) exposure to covid-19: Secondary | ICD-10-CM | POA: Diagnosis present

## 2020-05-20 DIAGNOSIS — Z9181 History of falling: Secondary | ICD-10-CM | POA: Diagnosis not present

## 2020-05-20 DIAGNOSIS — F419 Anxiety disorder, unspecified: Secondary | ICD-10-CM | POA: Diagnosis present

## 2020-05-20 DIAGNOSIS — K219 Gastro-esophageal reflux disease without esophagitis: Secondary | ICD-10-CM | POA: Diagnosis present

## 2020-05-20 DIAGNOSIS — M65811 Other synovitis and tenosynovitis, right shoulder: Secondary | ICD-10-CM | POA: Diagnosis present

## 2020-05-20 DIAGNOSIS — M75121 Complete rotator cuff tear or rupture of right shoulder, not specified as traumatic: Secondary | ICD-10-CM | POA: Diagnosis present

## 2020-05-20 LAB — BASIC METABOLIC PANEL
Anion gap: 8 (ref 5–15)
BUN: 18 mg/dL (ref 8–23)
CO2: 24 mmol/L (ref 22–32)
Calcium: 8.5 mg/dL — ABNORMAL LOW (ref 8.9–10.3)
Chloride: 103 mmol/L (ref 98–111)
Creatinine, Ser: 1.15 mg/dL — ABNORMAL HIGH (ref 0.44–1.00)
GFR, Estimated: 47 mL/min — ABNORMAL LOW (ref >60)
Glucose, Bld: 90 mg/dL (ref 70–99)
Potassium: 3.4 mmol/L — ABNORMAL LOW (ref 3.5–5.1)
Sodium: 135 mmol/L (ref 135–145)

## 2020-05-20 LAB — CBC
HCT: 35.1 % — ABNORMAL LOW (ref 36.0–46.0)
Hemoglobin: 11.6 g/dL — ABNORMAL LOW (ref 12.0–15.0)
MCH: 30.2 pg (ref 26.0–34.0)
MCHC: 33 g/dL (ref 30.0–36.0)
MCV: 91.4 fL (ref 80.0–100.0)
Platelets: 260 10*3/uL (ref 150–400)
RBC: 3.84 MIL/uL — ABNORMAL LOW (ref 3.87–5.11)
RDW: 14 % (ref 11.5–15.5)
WBC: 7.1 10*3/uL (ref 4.0–10.5)
nRBC: 0 % (ref 0.0–0.2)

## 2020-05-20 LAB — AMMONIA: Ammonia: 14 umol/L (ref 9–35)

## 2020-05-20 MED ORDER — POTASSIUM CHLORIDE 10 MEQ/100ML IV SOLN
10.0000 meq | INTRAVENOUS | Status: AC
  Administered 2020-05-20: 10 meq via INTRAVENOUS
  Filled 2020-05-20: qty 100

## 2020-05-20 MED ORDER — SODIUM CHLORIDE 0.9 % IV SOLN: INTRAVENOUS | Status: DC

## 2020-05-20 MED ORDER — MIRTAZAPINE 15 MG PO TABS
7.5000 mg | ORAL_TABLET | Freq: Every day | ORAL | Status: DC
  Administered 2020-05-20: 7.5 mg via ORAL
  Filled 2020-05-20: qty 1

## 2020-05-20 MED ORDER — HALOPERIDOL LACTATE 5 MG/ML IJ SOLN
2.5000 mg | Freq: Once | INTRAMUSCULAR | Status: AC
  Administered 2020-05-20: 2.5 mg via INTRAVENOUS
  Filled 2020-05-20: qty 1

## 2020-05-20 MED ORDER — POTASSIUM CHLORIDE CRYS ER 20 MEQ PO TBCR: 20.0000 meq | EXTENDED_RELEASE_TABLET | Freq: Once | ORAL | Status: DC

## 2020-05-20 MED ORDER — POTASSIUM CHLORIDE 10 MEQ/100ML IV SOLN
10.0000 meq | INTRAVENOUS | Status: DC
  Administered 2020-05-20: 10 meq via INTRAVENOUS
  Filled 2020-05-20: qty 100

## 2020-05-20 MED ORDER — MIRTAZAPINE 15 MG PO TABS: 7.5000 mg | ORAL_TABLET | Freq: Two times a day (BID) | ORAL | Status: DC | PRN

## 2020-05-20 NOTE — Progress Notes (Signed)
PROGRESS NOTE    [name]  QQP:[id number] DOB: 04-21-1932 DOA: 05/17/2020 PCP: Tracie Harrier, MD   Brief Narrative: 87-year-old with past medical history significant for hypothyroidism secondary to thyroidectomy, thyroid nodule, non-Hodgkin lymphoma completed therapy 10 years ago, who presents after frequent fall.  Patient fell on Tuesday morning 05/16/2020.  She does not remember how she fell.  She fell back water heater head also she has left thigh hematoma. Evaluation in the ED CT head with no acute abnormality. 135, creatinine 1.1, TSH 0.07 UA negative  Assessment & Plan:   Principal Problem:   Frequent falls Active Problems:   HLD (hyperlipidemia)   Anxiety   Chronic kidney disease (CKD), stage III (moderate) (HCC)   S/P complete thyroidectomy   Hypothyroidism   Hypertension   Hypokalemia  1-Weakness and frequent fall;  PT OT evaluation patient will require supervision UDS negative, vitamin D normal B-12; 713. Her thyroid free T4 was elevated, will adjust her medication. Synthroid reduce to 100 mcg.  She received IV fluids  Will have PT to re-evaluate patient again.   2-Acute Metabolic encephalopathy;  -Patient become confuse agitated overnight. Per daughter patient was not walking well last night and was confuse. -Patient received Haldol and  Remeron last night.  -She has been asleep all morning. On my evaluation around 12 noon patient wake up to voice, she was able to tell me her name and her daughter name. She didn't know where she was at or why she is in the [facility].  -Will rule out infection process, check UA and CBC> check ammonia level. Will also check CT head,   Hypothyroidism: TSH low and free T4 elevated.   Held synthroid for one day. Reduce dose to 100 mcg  -Right-sided shoulder pain: Retracted Rotator Cuff tear.  X-ray negative for fracture MRI showed: Large full-thickness retracted rotator cuff tear as described above.  Likely torn and retracted long head biceps tendon.Labral degenerative changes and probable superior labral tear.Moderate to large joint effusion and mild synovitis. Evaluated by Ortho recommend sling for comfort, no surgical indication during this hospitalization. Cloae follow up with Ortho.   -Left eye ecchymosis: CT head negative  -Hyperlipidemia continue with lovastatin -History  urinary retention continue with oxybutynin  -GERD continue with pantoprazole -Restless leg syndrome continue with ropinirole -Depression; continue with duloxetin.      Estimated body mass index is 29.99 kg/m as calculated from the following:   Height as of this encounter: 5\' 1"  (1.549 m).   Weight as of this encounter: 72 kg.   DVT prophylaxis: Heparin  Code Status: Full code Family Communication: Daughter over phone and sister who was at bedside.  Disposition Plan:  Status is: Observation  The patient remains OBS appropriate and will d/c before 2 midnights.  Dispo: The patient is from: Home              Anticipated d/c is to: to be determine              Patient currently is not medically stable to d/c.   Difficult to place patient No        Consultants:   None  Procedures:   none  Antimicrobials:    Subjective: Events from last night notice.  Patient got confuse last night, agitated. Per daughter patient was not walking well last night.  Patient was asleep most of the morning, wake up to voice, oriented to person . Patient continue to be sleepy.  Objective: Vitals:   05/19/20 1835 05/19/20 2316 05/20/20 0958 05/20/20 1123  BP: (!) 100/54 119/70 (!) 108/52 (!) 98/46  Pulse: 97 (!) 102 65 66  Resp: 18 20 18 18   Temp: 98.7 F (37.1 C) 98.3 F (36.8 C) 98.1 F (36.7 C) 98.4 F (36.9 C)  TempSrc:  Oral Oral   SpO2: 98% 98% 100% 100%  Weight:      Height:        Intake/Output Summary (Last 24 hours) at 05/20/2020 1240 Last data filed at 05/19/2020 2209 Gross per 24  hour  Intake -  Output 0 ml  Net 0 ml   Filed Weights   05/17/20 1533  Weight: 72 kg    Examination:  General exam: Sleepy, wake up answer few question  Respiratory system: CTA Cardiovascular system: S 1, S 2 RRR Gastrointestinal system: BS present, soft nt Central nervous system: Wake up to voice, speech clear, soft, oriented to person. Moves BL LE passively  Extremities no edema   Data Reviewed: I have personally reviewed following labs and imaging studies  CBC: Recent Labs  Lab 05/17/20 1609 05/18/20 0453  WBC 7.2 5.7  HGB 13.0 11.7*  HCT 38.8 35.0*  MCV 90.9 90.9  PLT 328 253   Basic Metabolic Panel: Recent Labs  Lab 05/17/20 1609 05/18/20 0453 05/19/20 0807  NA 135 141 135  K 2.9* 3.2* 3.8  CL 95* 102 102  CO2 29 28 25   GLUCOSE 124* 99 113*  BUN 28* 25* 18  CREATININE 1.14* 1.08* 0.99  CALCIUM 9.2 8.7* 8.6*  MG  --  2.2  --    GFR: Estimated Creatinine Clearance: 37.7 mL/min (by C-G formula based on SCr of 0.99 mg/dL). Liver Function Tests: No results for input(s): AST, ALT, ALKPHOS, BILITOT, PROT, ALBUMIN in the last 168 hours. No results for input(s): LIPASE, AMYLASE in the last 168 hours. No results for input(s): AMMONIA in the last 168 hours. Coagulation Profile: No results for input(s): INR, PROTIME in the last 168 hours. Cardiac Enzymes: No results for input(s): CKTOTAL, CKMB, CKMBINDEX, TROPONINI in the last 168 hours. BNP (last 3 results) No results for input(s): PROBNP in the last 8760 hours. HbA1C: No results for input(s): HGBA1C in the last 72 hours. CBG: No results for input(s): GLUCAP in the last 168 hours. Lipid Profile: No results for input(s): CHOL, HDL, LDLCALC, TRIG, CHOLHDL, LDLDIRECT in the last 72 hours. Thyroid Function Tests: Recent Labs    05/17/20 1609 05/17/20 2032 05/18/20 0453 05/19/20 0459  TSH  --  0.077*  --   --   T4TOTAL 13.7*  --   --   --   FREET4  --   --    < > 1.66*   < > = values in this interval  not displayed.   Anemia Panel: No results for input(s): VITAMINB12, FOLATE, FERRITIN, TIBC, IRON, RETICCTPCT in the last 72 hours. Sepsis Labs: No results for input(s): PROCALCITON, LATICACIDVEN in the last 168 hours.  Recent Results (from the past 240 hour(s))  Urine culture     Status: None   Collection Time: 05/17/20  4:09 PM   Specimen: Urine, Random  Result Value Ref Range Status   Specimen Description   Final    URINE, RANDOM Performed at [facility], [address] [place], [place] [address]    Special Requests   Final    NONE Performed at [facility], [address] [place], [place] [address]  Culture   Final    NO GROWTH Performed at [facility], [place] [address] [place], [place] [address]    Report Status 05/19/2020 FINAL  Final  SARS CORONAVIRUS 2 (TAT 6-24 HRS) Nasopharyngeal Nasopharyngeal Swab     Status: None   Collection Time: 05/17/20  8:33 PM   Specimen: Nasopharyngeal Swab  Result Value Ref Range Status   SARS Coronavirus 2 NEGATIVE NEGATIVE Final    Comment: (NOTE) SARS-CoV-2 target nucleic acids are NOT DETECTED.  The SARS-CoV-2 RNA is generally detectable in upper and lower respiratory specimens during the acute phase of infection. Negative results do not preclude SARS-CoV-2 infection, do not rule out co-infections with other pathogens, and should not be used as the sole basis for treatment or other patient management decisions. Negative results must be combined with clinical observations, patient history, and epidemiological information. The expected result is Negative.  Fact Sheet for Patients: SugarRoll.be  Fact Sheet for Healthcare Providers: https://www.woods-mathews.com/  This test is not yet approved or cleared by the [place] FDA and  has been authorized for detection and/or diagnosis of SARS-CoV-2 by FDA under an Emergency Use Authorization (EUA). This EUA will  remain  in effect (meaning this test can be used) for the duration of the COVID-19 declaration under Se ction 564(b)(1) of the Act, 21 U.S.C. section 360bbb-3(b)(1), unless the authorization is terminated or revoked sooner.  Performed at [facility], [place] [address], [place], [place] [address]          Radiology Studies: MR SHOULDER RIGHT WO CONTRAST  Result Date: 05/19/2020 CLINICAL DATA:  Right shoulder pain and limited range of motion. History of multiple falls. EXAM: MRI OF THE RIGHT SHOULDER WITHOUT CONTRAST TECHNIQUE: Multiplanar, multisequence MR imaging of the shoulder was performed. No intravenous contrast was administered. COMPARISON:  Radiographs 05/17/2020 FINDINGS: Exam is moderately limited by patient motion. Rotator cuff: Large full-thickness retracted rotator cuff tear. The infraspinatus and subscapularis tendons are torn and retracted. The upper fibers of the subscapularis tendon are also retracted. Maximum retraction of the supraspinatus tendon is 3.5 cm. Muscles: Mild fatty atrophy of the rotator cuff muscles. There is also moderate edema like signal changes extending back into the supraspinatus and infraspinatus muscles. Biceps long head: Difficult to identified the intra-articular portion. It is likely torn and retracted. Acromioclavicular Joint: Mild to moderate AC joint degenerative changes. Type 2-3 acromion. No lateral downsloping or undersurface spurring. Glenohumeral Joint: Moderate degenerative changes. Moderate to large joint effusion mild synovitis. Labrum: Labral degenerative changes. Suspect superior labral tear. The anterior and posterior labrum are grossly intact. Bones:  No acute bony findings. Other: Expected fluid in the subacromial/subdeltoid bursa. IMPRESSION: 1. Large full-thickness retracted rotator cuff tear as described above. 2. Likely torn and retracted long head biceps tendon. 3. Labral degenerative changes and probable superior labral tear. 4.  Moderate to large joint effusion and mild synovitis. Electronically Signed   By: Marijo Sanes M.D.   On: 05/19/2020 15:56        Scheduled Meds: . diclofenac Sodium  2 g Topical QID  . DULoxetine  20 mg Oral Daily  . heparin  5,000 Units Subcutaneous Q8H  . levothyroxine  100 mcg Oral Q0600  . liothyronine  5 mcg Oral Daily  . mirtazapine  7.5 mg Oral QHS  . oxybutynin  5 mg Oral TID  . pantoprazole  40 mg Oral Daily  . pravastatin  40 mg Oral q1800  . rOPINIRole  2 mg Oral  BID   Continuous Infusions: . sodium chloride       LOS: 0 days    Time spent: 35 minutes    Belkys A Regalado, MD [facility]   If 7PM-7AM, please contact night-coverage [email or web address]  05/20/2020, 12:40 PM

## 2020-05-20 NOTE — Plan of Care (Signed)
Pt on room air, afebrile. Pt confused this shift and trying to jump out of bed multiple times. Provider notified and 1 dose haldol and Remeron given per order with little effect. Falls precautions remained in place, call bell within reach. No falls this shift Problem: Elimination: Goal: Will not experience complications related to bowel motility Outcome: Progressing   Problem: Pain Managment: Goal: General experience of comfort will improve Outcome: Progressing   Problem: Safety: Goal: Ability to remain free from injury will improve Outcome: Progressing

## 2020-05-20 NOTE — Evaluation (Addendum)
Occupational Therapy Re-Evaluation Patient Details Name: [name] MRN: [id number] DOB: [birth year] Today's Date: 05/20/2020    History of Present Illness [name] is a 87 y.o. female with medical history significant for hypothyroid secondary to thyroidectomy, thyroid nodule status post thyroidectomy (01/2020), on levothyroxine and liothyronine T3, nonhodgkin's lymphoma and completed chemotherapy about 10 years ago,  presents emergency department for chief concerns of frequent fall.  She fell on Tuesday morning, 05/16/20.  She denies remembering whether she felt lightheaded, overt chest pain, shortness of breath, dizziness, palpitations.   Clinical Impression   Pt seen for re-evaluation after now having been diagnosed with R rotator cuff tear. Pt appearing much more confused this session. OT reviewed this information with pt. No sling present in the room. Pt also does not complain of pain during session and attempts to utilize in functional manner. Sling would restrict her ability to utilize so we discussed that a sling can be used for comfort if needed secondary to pain. Pt appeared to have no interest in sling this session. Pt needing min A to transfer from [facility] to bed. Assistance needed to cut food this session but then pt utilizing B hands to hold items and eat food. Pt requesting to rest at this time. Sitter returning to room before therapist exited. Pt does reports she will be going to sister's home at discharge. Unsure if family is able to provide 24/7 assistance and supervision as pt lives alone. If family is unable to assist pt, she will need short term rehab stay to address functional deficits before returning home.    Follow Up Recommendations  SNF , 24/7 supervision   Equipment Recommendations  3 in 1 bedside commode       Precautions / Restrictions Precautions Precautions: Fall Restrictions Weight Bearing Restrictions: No Other Position/Activity Restrictions: new rotator  cuff tear. No plans for surgery. No weight bearing restrictions or limitations for functional tasks. Can wear sling for comfort.      Mobility Bed Mobility Overal bed mobility: Needs Assistance Bed Mobility: Sit to Supine       Sit to supine: Supervision   General bed mobility comments: no physical assistance needed    Transfers Overall transfer level: Needs assistance Equipment used: None Transfers: Sit to/from Stand Sit to Stand: Min assist              Balance Overall balance assessment: Needs assistance Sitting-balance support: Feet supported Sitting balance-Leahy Scale: Good Sitting balance - Comments: steady sitting reaching outside BOS   Standing balance support: Bilateral upper extremity supported;During functional activity Standing balance-Leahy Scale: Fair                             ADL either performed or assessed with clinical judgement   ADL Overall ADL's : Needs assistance/impaired Eating/Feeding: Set up;Sitting Eating/Feeding Details (indicate cue type and reason): assist to cut food                     Toilet Transfer: Minimal assistance;BSC                   Vision Patient Visual Report: No change from baseline              Pertinent Vitals/Pain Pain Assessment: No/denies pain              Cognition Arousal/Alertness: Awake/alert Behavior During Therapy: WFL for tasks assessed/performed Overall Cognitive Status: Impaired/Different  from baseline                                 General Comments: Pt appears much more confused this session but remains pleasant and cooperative. Pt with no c/o pain this session.              OT Goals(Current goals can be found in the care plan section) Acute Rehab OT Goals Patient Stated Goal: to go home OT Goal Formulation: With patient Time For Goal Achievement: 06/03/20 Potential to Achieve Goals: Good  OT Frequency: Min 2X/week    AM-PAC OT "6  Clicks" Daily Activity     Outcome Measure Help from another person eating meals?: A Little Help from another person taking care of personal grooming?: A Little Help from another person toileting, which includes using toliet, bedpan, or urinal?: A Little Help from another person bathing (including washing, rinsing, drying)?: A Little Help from another person to put on and taking off regular upper body clothing?: None Help from another person to put on and taking off regular lower body clothing?: A Little 6 Click Score: 19   End of Session Nurse Communication: Mobility status  Activity Tolerance: Patient tolerated treatment well Patient left: in bed;with call bell/phone within reach  OT Visit Diagnosis: Unsteadiness on feet (R26.81);Repeated falls (R29.6)                Time: 6811-5726 OT Time Calculation (min): 24 min Charges:  OT General Charges $OT Visit: 1 Visit OT Evaluation $OT Re-eval: 1 Re-eval OT Treatments $Self Care/Home Management : 23-37 mins  Darleen Crocker, MS, OTR/L , CBIS ascom [phone]  05/20/20, 2:40 PM

## 2020-05-20 NOTE — Consult Note (Signed)
ORTHOPAEDIC CONSULTATION  REQUESTING PHYSICIAN: Elmarie Shiley, MD  Chief Complaint: Right shoulder pain status post fall  HPI: [name] is a 87 y.o. female who injured her shoulder during a recent fall.  An MRI of the right shoulder revealed a large full-thickness and retracted rotator cuff tear involving the supra and infraspinatus tendons.  Orthopedics is consulted for her rotator cuff tear.  At the bedside today the patient is drowsy but arousable.  She denies any significant pain in the right shoulder at this time.  There is a sitter at the bedside.  Past Medical History:  Diagnosis Date  . Afib ([place])   . Anxiety   . Anxiety   . Basal cell carcinoma 11/11/2017   Right inferior knee. Superficial and nodular patterns.  . Bronchitis    recent  . Cancer ([place])    Lymphoma   . Chronic kidney disease    ckd stage 3  . Congestive heart failure (CHF) ([place])   . Coronary artery disease    Status post stent placement in LAD  . Dysrhythmia   . GERD (gastroesophageal reflux disease)   . Hyperlipemia   . Hypertension   . Hypothyroidism   . Thyroid disease    Past Surgical History:  Procedure Laterality Date  . ABDOMINAL HYSTERECTOMY     complete  . CORONARY ANGIOPLASTY     stent  . CORONARY STENT PLACEMENT    . EXCISION OF ABDOMINAL WALL TUMOR Right 05/08/2018   Procedure: EXCISION OF ABDOMINAL WALL-RIGHT;  Surgeon: Herbert Pun, MD;  Location: [facility] ORS;  Service: General;  Laterality: Right;  . INCISIONAL HERNIA REPAIR N/A 05/08/2018   Procedure: LAPAROSCOPIC VS. OPEN INCISIONAL HERNIA REPAIR WITH MESH;  Surgeon: Herbert Pun, MD;  Location: [facility] ORS;  Service: General;  Laterality: N/A;  . THYROIDECTOMY Bilateral 11/17/2019   Procedure: TOTAL THYROIDECTOMY;  Surgeon: Margaretha Sheffield, MD;  Location: [facility] ORS;  Service: ENT;  Laterality: Bilateral;  . VEIN SURGERY     Endovenous ablation of saphenous vein   Social History   Socioeconomic History  .  Marital status: Single    Spouse name: Not on file  . Number of children: Not on file  . Years of education: Not on file  . Highest education level: Not on file  Occupational History  . Occupation: retired  Tobacco Use  . Smoking status: Never Smoker  . Smokeless tobacco: Never Used  Vaping Use  . Vaping Use: Never used  Substance and Sexual Activity  . Alcohol use: No  . Drug use: No  . Sexual activity: Not on file  Other Topics Concern  . Not on file  Social History Narrative   Lives at home independently. Has a cane to ambulate occasionally. Continues to drive.   Social Determinants of Health   Financial Resource Strain: Not on file  Food Insecurity: Not on file  Transportation Needs: Not on file  Physical Activity: Not on file  Stress: Not on file  Social Connections: Not on file   Family History  Problem Relation Age of Onset  . Heart Problems Mother   . Heart Problems Father   . Heart attack Father   . Diabetes Brother   . Diabetes Brother    Allergies  Allergen Reactions  . Cefdinir Other (See Comments)  . Sulfa Antibiotics Hives and Rash   Prior to Admission medications   Medication Sig Start Date End Date Taking? Authorizing Provider  Calcium Carb-Cholecalciferol (CALCIUM + D3 PO) Take 1  tablet by mouth in the morning and at bedtime.   Yes [provider]  DULoxetine (CYMBALTA) 20 MG capsule Take 20 mg by mouth daily.  04/14/19  Yes [provider]  furosemide (LASIX) 40 MG tablet Take 0.5 tablets (20 mg total) by mouth daily. Patient taking differently: Take 40 mg by mouth daily. 05/01/20  Yes Barb Merino, MD  levothyroxine (SYNTHROID) 112 MCG tablet Take 112 mcg by mouth every morning. 04/22/20  Yes [provider]  liothyronine (CYTOMEL) 5 MCG tablet Take 5 mcg by mouth daily. 04/14/20 04/14/21 Yes [provider]  lovastatin (MEVACOR) 40 MG tablet Take 40 mg by mouth daily with supper.  03/01/15  Yes [provider]  oxybutynin (DITROPAN) 5 MG tablet Take 5 mg by mouth 3 (three) times daily. 08/14/19  Yes [provider]  pantoprazole (PROTONIX) 40 MG tablet Take 40 mg by mouth daily. 04/14/19  Yes [provider]  potassium chloride SA (KLOR-CON) 20 MEQ tablet Take 1 tablet (20 mEq total) by mouth daily. Patient taking differently: Take 10 mEq by mouth daily. 05/01/20 05/31/20 Yes Ghimire, Dante Gang, MD  rOPINIRole (REQUIP) 2 MG tablet Take 2 mg by mouth 2 (two) times daily.   Yes [provider]  traMADol (ULTRAM) 50 MG tablet Take 50 mg by mouth 2 (two) times daily as needed. 11/27/19  Yes [provider]  acetaminophen (TYLENOL) 325 MG tablet Take 1 tablet (325 mg total) by mouth every 6 (six) hours as needed for mild pain, fever or headache (or Fever >/= 101). 05/19/20   Regalado, Belkys A, MD  diclofenac Sodium (VOLTAREN) 1 % GEL Apply 2 g topically 4 (four) times daily. 05/19/20   Regalado, Jerald Kief A, MD  levothyroxine (SYNTHROID) 100 MCG tablet Take 1 tablet (100 mcg total) by mouth daily at 6 (six) AM. 05/20/20   Regalado, Cassie Freer, MD   MR SHOULDER RIGHT WO CONTRAST  Result Date: 05/19/2020 CLINICAL DATA:  Right shoulder pain and limited range of motion. History of multiple falls. EXAM: MRI OF THE RIGHT SHOULDER WITHOUT CONTRAST TECHNIQUE: Multiplanar, multisequence MR imaging of the shoulder was performed. No intravenous contrast was administered. COMPARISON:  Radiographs 05/17/2020 FINDINGS: Exam is moderately limited by patient motion. Rotator cuff: Large full-thickness retracted rotator cuff tear. The infraspinatus and subscapularis tendons are torn and retracted. The upper fibers of the subscapularis tendon are also retracted. Maximum retraction of the supraspinatus tendon is 3.5 cm. Muscles: Mild fatty atrophy of the rotator cuff muscles. There is also moderate edema like signal changes extending back into the supraspinatus and infraspinatus muscles. Biceps long head:  Difficult to identified the intra-articular portion. It is likely torn and retracted. Acromioclavicular Joint: Mild to moderate AC joint degenerative changes. Type 2-3 acromion. No lateral downsloping or undersurface spurring. Glenohumeral Joint: Moderate degenerative changes. Moderate to large joint effusion mild synovitis. Labrum: Labral degenerative changes. Suspect superior labral tear. The anterior and posterior labrum are grossly intact. Bones:  No acute bony findings. Other: Expected fluid in the subacromial/subdeltoid bursa. IMPRESSION: 1. Large full-thickness retracted rotator cuff tear as described above. 2. Likely torn and retracted long head biceps tendon. 3. Labral degenerative changes and probable superior labral tear. 4. Moderate to large joint effusion and mild synovitis. Electronically Signed   By: Marijo Sanes M.D.   On: 05/19/2020 15:56    Positive ROS: All other systems have been reviewed and were otherwise negative with the exception of those mentioned in the HPI and as above.  Physical Exam: General: Drowsy but arousable.  MUSCULOSKELETAL: Right shoulder: Patient skin is intact.  There is no erythema ecchymosis or large effusion.  There is no deformity.  Patient demonstrates that she can forward elevate above her head with minimal pain.  Patient demonstrates no obvious weakness to gentle strength testing of the right shoulder.  She has full digital wrist and elbow range of motion, intact sensation light touch and a palpable radial pulse.  Assessment: Full-thickness tear of the rotator cuff  Plan: I reviewed the patient's MRI.  She has full-thickness rotator cuff tear involving the supra and infraspinatus tendons which are retracted to the glenohumeral joint.  Patient has mild fatty atrophy of the rotator cuff muscles.  She appears to have a spontaneous rupture of the long head of the biceps tendon as well.  She has moderate AC joint and glenohumeral joint arthrosis.  At this  time the patient is not having significant pain and is to move her right shoulder well despite her large tear.  There is no surgical intervention necessary during this hospitalization.  She may follow-up in the office to discuss operative and nonoperative treatment for her injury.  It is possible this is the acute on chronic injury.  I recommend the patient continue physical and occupational therapy and follow-up at [facility] in [place].  Patient may call for an appointment at [phone].  The patient has no limitations to motion of her right shoulder.  She is only limited by her own pain.     Thornton Park, MD    05/20/2020 5:07 PM

## 2020-05-21 DIAGNOSIS — R296 Repeated falls: Secondary | ICD-10-CM | POA: Diagnosis not present

## 2020-05-21 LAB — URINALYSIS, COMPLETE (UACMP) WITH MICROSCOPIC
Bacteria, UA: NONE SEEN
Bilirubin Urine: NEGATIVE
Glucose, UA: NEGATIVE mg/dL
Hgb urine dipstick: NEGATIVE
Ketones, ur: NEGATIVE mg/dL
Leukocytes,Ua: NEGATIVE
Nitrite: NEGATIVE
Protein, ur: NEGATIVE mg/dL
Specific Gravity, Urine: 1.004 — ABNORMAL LOW (ref 1.005–1.030)
pH: 6 (ref 5.0–8.0)

## 2020-05-21 LAB — METHYLMALONIC ACID, SERUM: Methylmalonic Acid, Quantitative: 328 nmol/L (ref 0–378)

## 2020-05-21 NOTE — Progress Notes (Signed)
PROGRESS NOTE    [name]  JGG:[id number] DOB: Nov 26, 1932 DOA: 05/17/2020 PCP: Tracie Harrier, MD   Brief Narrative: 87-year-old with past medical history significant for hypothyroidism secondary to thyroidectomy, thyroid nodule, non-Hodgkin lymphoma completed therapy 10 years ago, who presents after frequent fall.  Patient fell on Tuesday morning 05/16/2020.  She does not remember how she fell.  She fell back water heater head also she has left thigh hematoma. Evaluation in the ED CT head with no acute abnormality. 135, creatinine 1.1, TSH 0.07 UA negative.   Assessment & Plan:   Principal Problem:   Frequent falls Active Problems:   HLD (hyperlipidemia)   Anxiety   Chronic kidney disease (CKD), stage III (moderate) (HCC)   S/P complete thyroidectomy   Hypothyroidism   Hypertension   Hypokalemia  1-Weakness and frequent fall;  PT OT evaluation patient will require supervision UDS negative, vitamin D normal B-12; 713. Her thyroid free T4 was elevated, will adjust her medication. Synthroid reduce to 100 mcg.  She received IV fluids  Patient is more unstable,and weak than baseline. She will benefit from SNF to regain prior independent level.  SW consulted for SNF   2-Acute Metabolic encephalopathy;  -Patient become confuse agitated overnight. Per daughter patient was not walking well last night and was confuse. -Patient received Haldol and  Remeron last night.  -She has been asleep all morning. On my evaluation around 12 noon patient wake up to voice, she was able to tell me her name and her daughter name. She didn't know where she was at or why she is in the [facility] Delirium.  -CT head negative for subdural hematoma, UA negative for UTI, No leukocytosis. Ammonia level normal/  -Suspect lethargic related to medications and Hospital delirium.  -She is more alert, less confuse, doesn't remember events.    Hypothyroidism: TSH low and free T4 elevated.    Held synthroid for one day. Reduce dose to 100 mcg  -Right-sided shoulder pain: Retracted Rotator Cuff tear.  X-ray negative for fracture MRI showed: Large full-thickness retracted rotator cuff tear as described above. Likely torn and retracted long head biceps tendon.Labral degenerative changes and probable superior labral tear.Moderate to large joint effusion and mild synovitis. Evaluated by Ortho recommend sling for comfort, no surgical indication during this hospitalization. Close follow up with Ortho.   -Left eye ecchymosis: CT head negative  -Hyperlipidemia continue with lovastatin -History  urinary retention continue with oxybutynin  -GERD continue with pantoprazole -Restless leg syndrome continue with ropinirole -Depression; continue with duloxetin.      Estimated body mass index is 29.99 kg/m as calculated from the following:   Height as of this encounter: 5\' 1"  (1.549 m).   Weight as of this encounter: 72 kg.   DVT prophylaxis: Heparin  Code Status: Full code Family Communication: Daughter who was at bedside.  Disposition Plan:  Status is: Observation  The patient remains OBS appropriate and will d/c before 2 midnights.  Dispo: The patient is from: Home              Anticipated d/c is to: to be determine              Patient currently is not medically stable to d/c.   Difficult to place patient No        Consultants:   None  Procedures:   none  Antimicrobials:    Subjective: She is more alert, less confuse. Complaining shoulder pain, heaviness.  Daughter notice  some improvement.   Objective: Vitals:   05/21/20 0430 05/21/20 0758 05/21/20 1144 05/21/20 1213  BP: (!) 115/58 (!) 101/46 (!) 102/55 (!) 106/47  Pulse: 93 70 66 63  Resp: 18 18 18 18   Temp: 99.3 F (37.4 C) 97.6 F (36.4 C) 98.3 F (36.8 C) 98.3 F (36.8 C)  TempSrc:      SpO2: 96% 97% 97% 96%  Weight:      Height:        Intake/Output Summary (Last 24 hours) at  05/21/2020 1347 Last data filed at 05/21/2020 1300 Gross per 24 hour  Intake 2203.21 ml  Output 1850 ml  Net 353.21 ml   Filed Weights   05/17/20 1533  Weight: 72 kg    Examination:  General exam: she is more alert Respiratory system: CTA Cardiovascular system: S1, S 2 RRR Gastrointestinal system: BS present, soft, nt Central nervous system: alert, follows command, speech clear Extremities no edema   Data Reviewed: I have personally reviewed following labs and imaging studies  CBC: Recent Labs  Lab 05/17/20 1609 05/18/20 0453 05/20/20 1216  WBC 7.2 5.7 7.1  HGB 13.0 11.7* 11.6*  HCT 38.8 35.0* 35.1*  MCV 90.9 90.9 91.4  PLT 328 289 157   Basic Metabolic Panel: Recent Labs  Lab 05/17/20 1609 05/18/20 0453 05/19/20 0807 05/20/20 1216  NA 135 141 135 135  K 2.9* 3.2* 3.8 3.4*  CL 95* 102 102 103  CO2 29 28 25 24   GLUCOSE 124* 99 113* 90  BUN 28* 25* 18 18  CREATININE 1.14* 1.08* 0.99 1.15*  CALCIUM 9.2 8.7* 8.6* 8.5*  MG  --  2.2  --   --    GFR: Estimated Creatinine Clearance: 32.5 mL/min (A) (by C-G formula based on SCr of 1.15 mg/dL (H)). Liver Function Tests: No results for input(s): AST, ALT, ALKPHOS, BILITOT, PROT, ALBUMIN in the last 168 hours. No results for input(s): LIPASE, AMYLASE in the last 168 hours. Recent Labs  Lab 05/20/20 1237  AMMONIA 14   Coagulation Profile: No results for input(s): INR, PROTIME in the last 168 hours. Cardiac Enzymes: No results for input(s): CKTOTAL, CKMB, CKMBINDEX, TROPONINI in the last 168 hours. BNP (last 3 results) No results for input(s): PROBNP in the last 8760 hours. HbA1C: No results for input(s): HGBA1C in the last 72 hours. CBG: No results for input(s): GLUCAP in the last 168 hours. Lipid Profile: No results for input(s): CHOL, HDL, LDLCALC, TRIG, CHOLHDL, LDLDIRECT in the last 72 hours. Thyroid Function Tests: Recent Labs    05/19/20 0459  FREET4 1.66*   Anemia Panel: No results for  input(s): VITAMINB12, FOLATE, FERRITIN, TIBC, IRON, RETICCTPCT in the last 72 hours. Sepsis Labs: No results for input(s): PROCALCITON, LATICACIDVEN in the last 168 hours.  Recent Results (from the past 240 hour(s))  Urine culture     Status: None   Collection Time: 05/17/20  4:09 PM   Specimen: Urine, Random  Result Value Ref Range Status   Specimen Description   Final    URINE, RANDOM Performed at [facility], [address], [place], [place] [address]    Special Requests   Final    NONE Performed at [facility], [address], [place], [place] [address]    Culture   Final    NO GROWTH Performed at [facility], [place] [address], [place],  [address]    Report Status 05/19/2020 FINAL  Final  SARS CORONAVIRUS 2 (TAT 6-24  HRS) Nasopharyngeal Nasopharyngeal Swab     Status: None   Collection Time: 05/17/20  8:33 PM   Specimen: Nasopharyngeal Swab  Result Value Ref Range Status   SARS Coronavirus 2 NEGATIVE NEGATIVE Final    Comment: (NOTE) SARS-CoV-2 target nucleic acids are NOT DETECTED.  The SARS-CoV-2 RNA is generally detectable in upper and lower respiratory specimens during the acute phase of infection. Negative results do not preclude SARS-CoV-2 infection, do not rule out co-infections with other pathogens, and should not be used as the sole basis for treatment or other patient management decisions. Negative results must be combined with clinical observations, patient history, and epidemiological information. The expected result is Negative.  Fact Sheet for Patients: SugarRoll.be  Fact Sheet for Healthcare Providers: https://www.woods-mathews.com/  This test is not yet approved or cleared by the [place] FDA and  has been authorized for detection and/or diagnosis of SARS-CoV-2 by FDA under an Emergency Use Authorization (EUA). This EUA will remain  in effect (meaning this test can be  used) for the duration of the COVID-19 declaration under Se ction 564(b)(1) of the Act, 21 U.S.C. section 360bbb-3(b)(1), unless the authorization is terminated or revoked sooner.  Performed at [facility], [place] [address] [place], [place] [address]          Radiology Studies: CT HEAD WO CONTRAST  Result Date: 05/20/2020 CLINICAL DATA:  Delirium, recent fall EXAM: CT HEAD WITHOUT CONTRAST TECHNIQUE: Contiguous axial images were obtained from the base of the skull through the vertex without intravenous contrast. COMPARISON:  05/17/2020 FINDINGS: Brain: No evidence of acute infarction, hemorrhage, hydrocephalus, extra-axial collection or mass lesion/mass effect. Scattered low-density changes within the periventricular and subcortical white matter compatible with chronic microvascular ischemic change. Mild diffuse cerebral volume loss. Vascular: Atherosclerotic calcifications involving the large vessels of the skull base. No unexpected hyperdense vessel. Skull: Normal. Negative for fracture or focal lesion. Sinuses/Orbits: No acute finding. Other: None. IMPRESSION: 1. No acute intracranial findings. 2. Chronic microvascular ischemic change and cerebral volume loss. Electronically Signed   By: Davina Poke D.O.   On: 05/20/2020 20:27   MR SHOULDER RIGHT WO CONTRAST  Result Date: 05/19/2020 CLINICAL DATA:  Right shoulder pain and limited range of motion. History of multiple falls. EXAM: MRI OF THE RIGHT SHOULDER WITHOUT CONTRAST TECHNIQUE: Multiplanar, multisequence MR imaging of the shoulder was performed. No intravenous contrast was administered. COMPARISON:  Radiographs 05/17/2020 FINDINGS: Exam is moderately limited by patient motion. Rotator cuff: Large full-thickness retracted rotator cuff tear. The infraspinatus and subscapularis tendons are torn and retracted. The upper fibers of the subscapularis tendon are also retracted. Maximum retraction of the supraspinatus tendon is 3.5 cm.  Muscles: Mild fatty atrophy of the rotator cuff muscles. There is also moderate edema like signal changes extending back into the supraspinatus and infraspinatus muscles. Biceps long head: Difficult to identified the intra-articular portion. It is likely torn and retracted. Acromioclavicular Joint: Mild to moderate AC joint degenerative changes. Type 2-3 acromion. No lateral downsloping or undersurface spurring. Glenohumeral Joint: Moderate degenerative changes. Moderate to large joint effusion mild synovitis. Labrum: Labral degenerative changes. Suspect superior labral tear. The anterior and posterior labrum are grossly intact. Bones:  No acute bony findings. Other: Expected fluid in the subacromial/subdeltoid bursa. IMPRESSION: 1. Large full-thickness retracted rotator cuff tear as described above. 2. Likely torn and retracted long head biceps tendon. 3. Labral degenerative changes and probable superior labral tear. 4. Moderate to large joint effusion and mild synovitis. Electronically Signed  By: Marijo Sanes M.D.   On: 05/19/2020 15:56        Scheduled Meds: . diclofenac Sodium  2 g Topical QID  . DULoxetine  20 mg Oral Daily  . heparin  5,000 Units Subcutaneous Q8H  . levothyroxine  100 mcg Oral Q0600  . liothyronine  5 mcg Oral Daily  . oxybutynin  5 mg Oral TID  . pantoprazole  40 mg Oral Daily  . pravastatin  40 mg Oral q1800  . rOPINIRole  2 mg Oral BID   Continuous Infusions: . sodium chloride 75 mL/hr at 05/21/20 1300     LOS: 1 day    Time spent: 35 minutes    Belkys A Regalado, MD [facility]   If 7PM-7AM, please contact night-coverage [email or web address]  05/21/2020, 1:47 PM

## 2020-05-22 DIAGNOSIS — R296 Repeated falls: Secondary | ICD-10-CM | POA: Diagnosis not present

## 2020-05-22 LAB — BASIC METABOLIC PANEL
Anion gap: 6 (ref 5–15)
BUN: 17 mg/dL (ref 8–23)
CO2: 22 mmol/L (ref 22–32)
Calcium: 8.3 mg/dL — ABNORMAL LOW (ref 8.9–10.3)
Chloride: 112 mmol/L — ABNORMAL HIGH (ref 98–111)
Creatinine, Ser: 0.98 mg/dL (ref 0.44–1.00)
GFR, Estimated: 57 mL/min — ABNORMAL LOW (ref >60)
Glucose, Bld: 105 mg/dL — ABNORMAL HIGH (ref 70–99)
Potassium: 3.7 mmol/L (ref 3.5–5.1)
Sodium: 140 mmol/L (ref 135–145)

## 2020-05-22 NOTE — TOC Transition Note (Signed)
Transition of Care [facility]) - CM/SW Discharge Note   Patient Details  Name: WILEY FLICKER MRN: [id number] Date of Birth: 11/01/1932  [facility] of [facility]) CM/SW Contact:  Candie Chroman, LCSW Phone Number: 05/22/2020, 1:58 PM   Clinical Narrative: Patient has orders to discharge home today. [facility] representative is aware. RN will call daughter to arrange a time for pickup. No further concerns. CSW signing off.    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

## 2020-05-22 NOTE — Discharge Summary (Addendum)
Physician Discharge Summary  [name] NGE:[id number] DOB: 03-17-1932 DOA: 05/17/2020  PCP: Tracie Harrier, MD  Admit date: 05/17/2020 Discharge date: 05/22/2020  Admitted From: Home  Disposition:  Home   Recommendations for Outpatient Follow-up:  1. Follow up with PCP in 1-2 weeks 2. Please obtain BMP/CBC in one week 3. Follow up with Dr Mack Guise orthopoedic for further care of Rotator Cuff tear.  4. Check TSH and Free T 4   Home Health: Yes.   Discharge Condition: Stable.  CODE STATUS: Full Code Diet recommendation: Heart Healthy   Brief/Interim Summary: 87 year old with past medical history significant for hypothyroidism secondary to thyroidectomy, thyroid nodule, non-Hodgkin lymphoma completed therapy 10 years ago, who presents after frequent fall.  Patient fell on Tuesday morning 05/16/2020.  She does not remember how she fell.  She fell back water heater head also she has left thigh hematoma. Evaluation in the ED CT head with no acute abnormality. 135, creatinine 1.1, TSH 0.07 UA negative.   1-Weakness and frequent fall;  PT OT evaluation patient will require supervision UDS negative, vitamin D normal B-12; 713. Her thyroid free T4 was elevated, will adjust her medication. Synthroid reduce to 100 mcg.  She received IV fluids  Patient was able to ambulate 600 feet today. PT now recommending HH. Plan to discharge home with [facility].   2-Acute Metabolic encephalopathy;  -Patient become confuse agitated overnight. Per daughter patient was not walking well last night and was confuse. -Patient received Haldol and  Remeron last night.  -She has been asleep all morning. On my evaluation around 12 noon patient wake up to voice, she was able to tell me her name and her daughter name. She didn't know where she was at or why she is in the [facility] Delirium.  -CT head negative for subdural hematoma, UA negative for UTI, No leukocytosis. Ammonia level normal/   -Suspect lethargic related to medications and Hospital delirium.  -She is improved.   Hypothyroidism: TSH low and free T4 elevated.   Held synthroid for one day. Reduce dose to 100 mcg Needs repeat TSH and Free T 4 in 4 weeks.   -Right-sided shoulder pain: Retracted Rotator Cuff tear.  X-ray negative for fracture MRI showed: Large full-thickness retracted rotator cuff tear as described above. Likely torn and retracted long head biceps tendon.Labral degenerative changes and probable superior labral tear.Moderate to large joint effusion and mild synovitis. Evaluated by Ortho recommend sling for comfort, no surgical indication during this hospitalization. Close follow up with Ortho.  Resume home tramadol.   -Left eye ecchymosis: CT head negative  -Hyperlipidemia continue with lovastatin -History  urinary retention continue with oxybutynin  -GERD Continue with pantoprazole -Restless leg syndrome Continue with ropinirole -Depression; Continue with duloxetin.  Chronic Diastolic HF; compensated. Lasix held at discharge to avoid dehydration and recurrent fall. CKD stage IIIb   Discharge Diagnoses:  Principal Problem:   Frequent falls Active Problems:   HLD (hyperlipidemia)   Anxiety   Chronic kidney disease (CKD), stage III (moderate) (HCC)   S/P complete thyroidectomy   Hypothyroidism   Hypertension   Hypokalemia    Discharge Instructions  Discharge Instructions    Diet - low sodium heart healthy   Complete by: As directed    Diet - low sodium heart healthy   Complete by: As directed    Increase activity slowly   Complete by: As directed    Increase activity slowly   Complete by: As directed  Allergies as of 05/22/2020      Reactions   Cefdinir Other (See Comments)   Sulfa Antibiotics Hives, Rash      Medication List    STOP taking these medications   furosemide 40 MG tablet Commonly known as: LASIX   potassium chloride SA 20 MEQ tablet Commonly  known as: KLOR-CON     TAKE these medications   acetaminophen 325 MG tablet Commonly known as: TYLENOL Take 1 tablet (325 mg total) by mouth every 6 (six) hours as needed for mild pain, fever or headache (or Fever >/= 101).   CALCIUM + D3 PO Take 1 tablet by mouth in the morning and at bedtime.   diclofenac Sodium 1 % Gel Commonly known as: VOLTAREN Apply 2 g topically 4 (four) times daily.   DULoxetine 20 MG capsule Commonly known as: CYMBALTA Take 20 mg by mouth daily.   levothyroxine 100 MCG tablet Commonly known as: SYNTHROID Take 1 tablet (100 mcg total) by mouth daily at 6 (six) AM. What changed:   medication strength  how much to take  when to take this   liothyronine 5 MCG tablet Commonly known as: CYTOMEL Take 5 mcg by mouth daily.   lovastatin 40 MG tablet Commonly known as: MEVACOR Take 40 mg by mouth daily with supper.   oxybutynin 5 MG tablet Commonly known as: DITROPAN Take 5 mg by mouth 3 (three) times daily.   pantoprazole 40 MG tablet Commonly known as: PROTONIX Take 40 mg by mouth daily.   rOPINIRole 2 MG tablet Commonly known as: REQUIP Take 2 mg by mouth 2 (two) times daily.   traMADol 50 MG tablet Commonly known as: ULTRAM Take 50 mg by mouth 2 (two) times daily as needed.       Follow-up Information    Tracie Harrier, MD On 05/23/2020.   Specialty: Internal Medicine Why: 3:00pm  Contact information: [address] [place] [address] [phone]        Thornton Park, MD Follow up in 1 week(s).   Specialty: Orthopedic Surgery Contact information: [place] [address] [phone]              Allergies  Allergen Reactions  . Cefdinir Other (See Comments)  . Sulfa Antibiotics Hives and Rash    Consultations:  Orthro    Procedures/Studies: DG Shoulder Right  Result Date: 05/17/2020 CLINICAL DATA:  87 year old female with fall and right shoulder  pain. EXAM: RIGHT SHOULDER - 2+ VIEW COMPARISON:  Chest radiograph dated 0 05/17/2020 FINDINGS: There is no acute fracture or dislocation. The bones are osteopenic. There is chronic widening of the [facility] joint measuring approximately 7 mm. The soft tissues are unremarkable. IMPRESSION: No acute fracture or dislocation. Electronically Signed   By: Anner Crete M.D.   On: 05/17/2020 22:41   CT HEAD WO CONTRAST  Result Date: 05/20/2020 CLINICAL DATA:  Delirium, recent fall EXAM: CT HEAD WITHOUT CONTRAST TECHNIQUE: Contiguous axial images were obtained from the base of the skull through the vertex without intravenous contrast. COMPARISON:  05/17/2020 FINDINGS: Brain: No evidence of acute infarction, hemorrhage, hydrocephalus, extra-axial collection or mass lesion/mass effect. Scattered low-density changes within the periventricular and subcortical white matter compatible with chronic microvascular ischemic change. Mild diffuse cerebral volume loss. Vascular: Atherosclerotic calcifications involving the large vessels of the skull base. No unexpected hyperdense vessel. Skull: Normal. Negative for fracture or focal lesion. Sinuses/Orbits: No acute finding. Other: None. IMPRESSION: 1. No acute intracranial  findings. 2. Chronic microvascular ischemic change and cerebral volume loss. Electronically Signed   By: Davina Poke D.O.   On: 05/20/2020 20:27   CT HEAD WO CONTRAST  Result Date: 05/17/2020 CLINICAL DATA:  Head injury after fall. EXAM: CT HEAD WITHOUT CONTRAST TECHNIQUE: Contiguous axial images were obtained from the base of the skull through the vertex without intravenous contrast. COMPARISON:  April 28, 2020. FINDINGS: Brain: Mild chronic ischemic white matter disease is noted. No mass effect or midline shift is noted. Ventricular size is within normal limits. There is no evidence of mass lesion, hemorrhage or acute infarction. Vascular: No hyperdense vessel or unexpected calcification. Skull: Normal.  Negative for fracture or focal lesion. Sinuses/Orbits: No acute finding. Other: None. IMPRESSION: Mild chronic ischemic white matter disease. No acute intracranial abnormality seen. Electronically Signed   By: Marijo Conception M.D.   On: 05/17/2020 16:02   CT Head Wo Contrast  Result Date: 04/28/2020 CLINICAL DATA:  Confusion, weakness, dizziness EXAM: CT HEAD WITHOUT CONTRAST TECHNIQUE: Contiguous axial images were obtained from the base of the skull through the vertex without intravenous contrast. COMPARISON:  02/15/2020 FINDINGS: Brain: No acute infarct or hemorrhage. Lateral ventricles and midline structures are unremarkable. No acute extra-axial fluid collections. No mass effect. Vascular: No hyperdense vessel or unexpected calcification. Skull: Normal. Negative for fracture or focal lesion. Sinuses/Orbits: No acute finding. Other: None. IMPRESSION: 1. Stable head CT, no acute intracranial process. Electronically Signed   By: Randa Ngo M.D.   On: 04/28/2020 20:59   CT Angio Chest PE W and/or Wo Contrast  Result Date: 04/28/2020 CLINICAL DATA:  Increasing confusing, weakness, elevated white blood cell count, lower abdominal pain EXAM: CT ANGIOGRAPHY CHEST CT ABDOMEN AND PELVIS WITH CONTRAST TECHNIQUE: Multidetector CT imaging of the chest was performed using the standard protocol during bolus administration of intravenous contrast. Multiplanar CT image reconstructions and MIPs were obtained to evaluate the vascular anatomy. Multidetector CT imaging of the abdomen and pelvis was performed using the standard protocol during bolus administration of intravenous contrast. CONTRAST:  80mL OMNIPAQUE IOHEXOL 350 MG/ML SOLN COMPARISON:  06/15/2019 FINDINGS: CTA CHEST FINDINGS Cardiovascular: This is a technically adequate evaluation of the pulmonary vasculature. No filling defects or pulmonary emboli. There is extensive calcification of the mitral annulus. No pericardial effusion. No evidence of thoracic aortic  aneurysm or dissection. Continued atherosclerosis of the aorta and coronary vessels. Mediastinum/Nodes: No enlarged mediastinal, hilar, or axillary lymph nodes. Thyroid gland, trachea, and esophagus demonstrate no significant findings. Lungs/Pleura: Mosaic areas of ground-glass attenuation within the mid and lower lung zones, favor small airway disease and air trapping. There is mild bibasilar bronchial wall thickening. No effusion or pneumothorax. Musculoskeletal: No acute or destructive bony lesions. Reconstructed images demonstrate no additional findings. Review of the MIP images confirms the above findings. CT ABDOMEN and PELVIS FINDINGS Hepatobiliary: No focal liver abnormality is seen. No gallstones, gallbladder wall thickening, or biliary dilatation. Pancreas: Unremarkable. No pancreatic ductal dilatation or surrounding inflammatory changes. Spleen: Normal in size without focal abnormality. Adrenals/Urinary Tract: Stable peripelvic cyst lower pole right kidney. Otherwise the kidneys enhance normally and symmetrically. No urinary tract calculi or evidence of obstruction. Bladder is moderately distended without focal abnormality. The adrenals are normal. Stomach/Bowel: No bowel obstruction or ileus. Moderate stool throughout the colon. No bowel wall thickening or inflammatory change. Vascular/Lymphatic: Aortic atherosclerosis. No enlarged abdominal or pelvic lymph nodes. Reproductive: Status post hysterectomy. No adnexal masses. Other: No free fluid or free gas. No abdominal wall hernia.  Musculoskeletal: No acute or destructive bony lesions. Prominent spondylosis at L2-3. Reconstructed images demonstrate no additional findings. Review of the MIP images confirms the above findings. IMPRESSION: 1. No evidence of pulmonary embolus. 2. Bibasilar bronchial wall thickening consistent with bronchitis. Ground-glass attenuation at the bases consistent with small airway disease and air trapping. 3. No acute  intra-abdominal or intrapelvic process. 4. Moderate fecal retention. 5. Aortic Atherosclerosis (ICD10-I70.0). Electronically Signed   By: Randa Ngo M.D.   On: 04/28/2020 21:09   MR BRAIN WO CONTRAST  Result Date: 04/29/2020 CLINICAL DATA:  Initial evaluation for acute delirium, mental status change. EXAM: MRI HEAD WITHOUT CONTRAST TECHNIQUE: Multiplanar, multiecho pulse sequences of the brain and surrounding structures were obtained without intravenous contrast. COMPARISON:  Prior CT from earlier the same day. FINDINGS: Brain: Diffuse prominence of the CSF containing spaces compatible generalized age-related cerebral atrophy. Minimal chronic microvascular ischemic disease for age. Small focus of encephalomalacia noted at the peripheral left cerebellum, stable from previous. No abnormal foci of restricted diffusion to suggest acute or subacute ischemia or other abnormality. Gray-white matter differentiation well maintained. No other areas of encephalomalacia to suggest chronic cortical infarction or other insult. No foci of susceptibility artifact to suggest acute or chronic intracranial hemorrhage. No mass lesion, midline shift or mass effect. No hydrocephalus or extra-axial fluid collection. Pituitary gland suprasellar region within normal limits. Midline structures intact. Vascular: Major intracranial vascular flow voids are maintained. Skull and upper cervical spine: Craniocervical junction within normal limits. Bone marrow signal intensity normal. No scalp soft tissue abnormality. Sinuses/Orbits: Globes and orbital soft tissues within normal limits. Mild scattered mucosal thickening noted within the ethmoidal air cells. Paranasal sinuses are otherwise clear. No mastoid effusion. Inner ear structures grossly normal. Other: None. IMPRESSION: 1. No acute intracranial abnormality. 2. Small focus of encephalomalacia at the peripheral left cerebellum, stable from previous. 3. Age-related cerebral atrophy with  mild chronic small vessel ischemic disease. Electronically Signed   By: Jeannine Boga M.D.   On: 04/29/2020 00:30   CT ABDOMEN PELVIS W CONTRAST  Result Date: 04/28/2020 CLINICAL DATA:  Increasing confusing, weakness, elevated white blood cell count, lower abdominal pain EXAM: CT ANGIOGRAPHY CHEST CT ABDOMEN AND PELVIS WITH CONTRAST TECHNIQUE: Multidetector CT imaging of the chest was performed using the standard protocol during bolus administration of intravenous contrast. Multiplanar CT image reconstructions and MIPs were obtained to evaluate the vascular anatomy. Multidetector CT imaging of the abdomen and pelvis was performed using the standard protocol during bolus administration of intravenous contrast. CONTRAST:  50mL OMNIPAQUE IOHEXOL 350 MG/ML SOLN COMPARISON:  06/15/2019 FINDINGS: CTA CHEST FINDINGS Cardiovascular: This is a technically adequate evaluation of the pulmonary vasculature. No filling defects or pulmonary emboli. There is extensive calcification of the mitral annulus. No pericardial effusion. No evidence of thoracic aortic aneurysm or dissection. Continued atherosclerosis of the aorta and coronary vessels. Mediastinum/Nodes: No enlarged mediastinal, hilar, or axillary lymph nodes. Thyroid gland, trachea, and esophagus demonstrate no significant findings. Lungs/Pleura: Mosaic areas of ground-glass attenuation within the mid and lower lung zones, favor small airway disease and air trapping. There is mild bibasilar bronchial wall thickening. No effusion or pneumothorax. Musculoskeletal: No acute or destructive bony lesions. Reconstructed images demonstrate no additional findings. Review of the MIP images confirms the above findings. CT ABDOMEN and PELVIS FINDINGS Hepatobiliary: No focal liver abnormality is seen. No gallstones, gallbladder wall thickening, or biliary dilatation. Pancreas: Unremarkable. No pancreatic ductal dilatation or surrounding inflammatory changes. Spleen: Normal in  size without focal abnormality.  Adrenals/Urinary Tract: Stable peripelvic cyst lower pole right kidney. Otherwise the kidneys enhance normally and symmetrically. No urinary tract calculi or evidence of obstruction. Bladder is moderately distended without focal abnormality. The adrenals are normal. Stomach/Bowel: No bowel obstruction or ileus. Moderate stool throughout the colon. No bowel wall thickening or inflammatory change. Vascular/Lymphatic: Aortic atherosclerosis. No enlarged abdominal or pelvic lymph nodes. Reproductive: Status post hysterectomy. No adnexal masses. Other: No free fluid or free gas. No abdominal wall hernia. Musculoskeletal: No acute or destructive bony lesions. Prominent spondylosis at L2-3. Reconstructed images demonstrate no additional findings. Review of the MIP images confirms the above findings. IMPRESSION: 1. No evidence of pulmonary embolus. 2. Bibasilar bronchial wall thickening consistent with bronchitis. Ground-glass attenuation at the bases consistent with small airway disease and air trapping. 3. No acute intra-abdominal or intrapelvic process. 4. Moderate fecal retention. 5. Aortic Atherosclerosis (ICD10-I70.0). Electronically Signed   By: Randa Ngo M.D.   On: 04/28/2020 21:09   MR SHOULDER RIGHT WO CONTRAST  Result Date: 05/19/2020 CLINICAL DATA:  Right shoulder pain and limited range of motion. History of multiple falls. EXAM: MRI OF THE RIGHT SHOULDER WITHOUT CONTRAST TECHNIQUE: Multiplanar, multisequence MR imaging of the shoulder was performed. No intravenous contrast was administered. COMPARISON:  Radiographs 05/17/2020 FINDINGS: Exam is moderately limited by patient motion. Rotator cuff: Large full-thickness retracted rotator cuff tear. The infraspinatus and subscapularis tendons are torn and retracted. The upper fibers of the subscapularis tendon are also retracted. Maximum retraction of the supraspinatus tendon is 3.5 cm. Muscles: Mild fatty atrophy of the  rotator cuff muscles. There is also moderate edema like signal changes extending back into the supraspinatus and infraspinatus muscles. Biceps long head: Difficult to identified the intra-articular portion. It is likely torn and retracted. Acromioclavicular Joint: Mild to moderate AC joint degenerative changes. Type 2-3 acromion. No lateral downsloping or undersurface spurring. Glenohumeral Joint: Moderate degenerative changes. Moderate to large joint effusion mild synovitis. Labrum: Labral degenerative changes. Suspect superior labral tear. The anterior and posterior labrum are grossly intact. Bones:  No acute bony findings. Other: Expected fluid in the subacromial/subdeltoid bursa. IMPRESSION: 1. Large full-thickness retracted rotator cuff tear as described above. 2. Likely torn and retracted long head biceps tendon. 3. Labral degenerative changes and probable superior labral tear. 4. Moderate to large joint effusion and mild synovitis. Electronically Signed   By: Marijo Sanes M.D.   On: 05/19/2020 15:56   DG Chest Port 1 View  Result Date: 05/17/2020 CLINICAL DATA:  Fall for the last 2 days EXAM: PORTABLE CHEST 1 VIEW COMPARISON:  February 15, 2020 FINDINGS: The heart size and mediastinal contours are within normal limits. Both lungs are clear. The visualized skeletal structures are unremarkable. IMPRESSION: No active disease. Electronically Signed   By: Prudencio Pair M.D.   On: 05/17/2020 22:42   ECHOCARDIOGRAM COMPLETE  Result Date: 04/29/2020    ECHOCARDIOGRAM REPORT   Patient Name:   CRYSTELLE FERRUFINO Date of Exam: 04/29/2020 Medical Rec #:  [id number]      Height:       61.0 in Accession #:    [id number]     Weight:       158.0 lb Date of Birth:  August 24, 1932      BSA:          1.709 m Patient Age:    39 years       BP:           101/54 mmHg Patient Gender: F  HR:           66 bpm. Exam Location:  [facility] Procedure: 2D Echo, Cardiac Doppler and Color Doppler Indications:     Fever R50.9  History:          Patient has prior history of Echocardiogram examinations. CHF;                  Risk Factors:Hypertension.  Sonographer:     Alyse Low Roar Referring Phys:  [place] Diagnosing Phys: Ida Rogue MD IMPRESSIONS  1. Left ventricular ejection fraction, by estimation, is 60 to 65%. The left ventricle has normal function. The left ventricle has no regional wall motion abnormalities. Left ventricular diastolic parameters are consistent with Grade I diastolic dysfunction (impaired relaxation).  2. Right ventricular systolic function is normal. The right ventricular size is normal. There is mildly elevated pulmonary artery systolic pressure. The estimated right ventricular systolic pressure is 24.4 mmHg.  3. Left atrial size was mildly dilated.  4. The mitral valve is normal in structure. Mild to moderate mitral valve regurgitation.  5. Tricuspid valve regurgitation is mild to moderate.  6. The aortic valve is normal in structure. Aortic valve regurgitation is mild. FINDINGS  Left Ventricle: Left ventricular ejection fraction, by estimation, is 60 to 65%. The left ventricle has normal function. The left ventricle has no regional wall motion abnormalities. The left ventricular internal cavity size was normal in size. There is  no left ventricular hypertrophy. Left ventricular diastolic parameters are consistent with Grade I diastolic dysfunction (impaired relaxation). Right Ventricle: The right ventricular size is normal. No increase in right ventricular wall thickness. Right ventricular systolic function is normal. There is mildly elevated pulmonary artery systolic pressure. The tricuspid regurgitant velocity is 2.81  m/s, and with an assumed right atrial pressure of 5 mmHg, the estimated right ventricular systolic pressure is 01.0 mmHg. Left Atrium: Left atrial size was mildly dilated. Right Atrium: Right atrial size was normal in size. Pericardium: There is no evidence of pericardial effusion. Mitral  Valve: The mitral valve is normal in structure. Moderate mitral annular calcification. Mild to moderate mitral valve regurgitation. No evidence of mitral valve stenosis. Tricuspid Valve: The tricuspid valve is normal in structure. Tricuspid valve regurgitation is mild to moderate. No evidence of tricuspid stenosis. Aortic Valve: The aortic valve is normal in structure. Aortic valve regurgitation is mild. Mild aortic valve sclerosis is present, with no evidence of aortic valve stenosis. Aortic valve peak gradient measures 14.6 mmHg. Pulmonic Valve: The pulmonic valve was normal in structure. Pulmonic valve regurgitation is not visualized. No evidence of pulmonic stenosis. Aorta: The aortic root is normal in size and structure. Venous: The inferior vena cava is normal in size with greater than 50% respiratory variability, suggesting right atrial pressure of 3 mmHg. IAS/Shunts: No atrial level shunt detected by color flow Doppler.  LEFT VENTRICLE PLAX 2D LVIDd:         4.41 cm  Diastology LVIDs:         3.03 cm  LV e' medial:    5.77 cm/s LV PW:         1.08 cm  LV E/e' medial:  24.4 LV IVS:        1.18 cm  LV e' lateral:   9.46 cm/s LVOT diam:     1.70 cm  LV E/e' lateral: 14.9 LVOT Area:     2.27 cm  RIGHT VENTRICLE RV Mid diam:    3.19 cm RV S  prime:     13.20 cm/s TAPSE (M-mode): 2.0 cm LEFT ATRIUM             Index       RIGHT ATRIUM           Index LA diam:        4.00 cm 2.34 cm/m  RA Area:     12.90 cm LA Vol (A2C):   66.2 ml 38.74 ml/m RA Volume:   28.50 ml  16.68 ml/m LA Vol (A4C):   71.4 ml 41.79 ml/m LA Biplane Vol: 70.8 ml 41.43 ml/m  AORTIC VALVE                PULMONIC VALVE AV Area (Vmax): 1.53 cm    PV Vmax:        1.07 m/s AV Vmax:        191.00 cm/s PV Peak grad:   4.6 mmHg AV Peak Grad:   14.6 mmHg   RVOT Peak grad: 2 mmHg LVOT Vmax:      129.00 cm/s  AORTA Ao Root diam: 2.70 cm MITRAL VALVE                TRICUSPID VALVE MV Area (PHT): 2.74 cm     TR Peak grad:   31.6 mmHg MV Decel Time:  277 msec     TR Vmax:        281.00 cm/s MV E velocity: 141.00 cm/s MV A velocity: 156.00 cm/s  SHUNTS MV E/A ratio:  0.90         Systemic Diam: 1.70 cm MV A Prime:    7.9 cm/s Ida Rogue MD Electronically signed by Ida Rogue MD Signature Date/Time: 04/29/2020/8:03:23 PM    Final       Subjective: Alert, oriented. She was able to ambulate to lap in the unit  Discharge Exam: Vitals:   05/22/20 0432 05/22/20 0814  BP: 127/61 (!) 115/55  Pulse: 89 73  Resp: 18 18  Temp: 98.5 F (36.9 C) 98.2 F (36.8 C)  SpO2: 95% 96%     General: Pt is alert, awake, not in acute distress Cardiovascular: RRR, S1/S2 +, no rubs, no gallops Respiratory: CTA bilaterally, no wheezing, no rhonchi Abdominal: Soft, NT, ND, bowel sounds + Extremities: no edema, no cyanosis    The results of significant diagnostics from this hospitalization (including imaging, microbiology, ancillary and laboratory) are listed below for reference.     Microbiology: Recent Results (from the past 240 hour(s))  Urine culture     Status: None   Collection Time: 05/17/20  4:09 PM   Specimen: Urine, Random  Result Value Ref Range Status   Specimen Description   Final    URINE, RANDOM Performed at [facility], [address], [place], [place] [address]    Special Requests   Final    NONE Performed at [facility], [address] [place], [place] [address]    Culture   Final    NO GROWTH Performed at [facility], [place] [address], [place], [place] [address]    Report Status 05/19/2020 FINAL  Final  SARS CORONAVIRUS 2 (TAT 6-24 HRS) Nasopharyngeal Nasopharyngeal Swab     Status: None   Collection Time: 05/17/20  8:33 PM   Specimen: Nasopharyngeal Swab  Result Value Ref Range Status   SARS Coronavirus 2 NEGATIVE NEGATIVE Final    Comment: (NOTE) SARS-CoV-2 target nucleic acids are NOT DETECTED.  The SARS-CoV-2 RNA is generally detectable  in upper and lower respiratory specimens  during the acute phase of infection. Negative results do not preclude SARS-CoV-2 infection, do not rule out co-infections with other pathogens, and should not be used as the sole basis for treatment or other patient management decisions. Negative results must be combined with clinical observations, patient history, and epidemiological information. The expected result is Negative.  Fact Sheet for Patients: SugarRoll.be  Fact Sheet for Healthcare Providers: https://www.woods-mathews.com/  This test is not yet approved or cleared by the [place] FDA and  has been authorized for detection and/or diagnosis of SARS-CoV-2 by FDA under an Emergency Use Authorization (EUA). This EUA will remain  in effect (meaning this test can be used) for the duration of the COVID-19 declaration under Se ction 564(b)(1) of the Act, 21 U.S.C. section 360bbb-3(b)(1), unless the authorization is terminated or revoked sooner.  Performed at [facility], [place] [address], [place], [place] [address]      Labs: BNP (last 3 results) Recent Labs    02/06/20 1433  BNP 70.6   Basic Metabolic Panel: Recent Labs  Lab 05/17/20 1609 05/18/20 0453 05/19/20 0807 05/20/20 1216 05/22/20 0448  NA 135 141 135 135 140  K 2.9* 3.2* 3.8 3.4* 3.7  CL 95* 102 102 103 112*  CO2 29 28 25 24 22   GLUCOSE 124* 99 113* 90 105*  BUN 28* 25* 18 18 17   CREATININE 1.14* 1.08* 0.99 1.15* 0.98  CALCIUM 9.2 8.7* 8.6* 8.5* 8.3*  MG  --  2.2  --   --   --    Liver Function Tests: No results for input(s): AST, ALT, ALKPHOS, BILITOT, PROT, ALBUMIN in the last 168 hours. No results for input(s): LIPASE, AMYLASE in the last 168 hours. Recent Labs  Lab 05/20/20 1237  AMMONIA 14   CBC: Recent Labs  Lab 05/17/20 1609 05/18/20 0453 05/20/20 1216  WBC 7.2 5.7 7.1  HGB 13.0 11.7* 11.6*  HCT 38.8 35.0* 35.1*  MCV 90.9 90.9 91.4  PLT 328 289 260   Cardiac Enzymes: No  results for input(s): CKTOTAL, CKMB, CKMBINDEX, TROPONINI in the last 168 hours. BNP: Invalid input(s): POCBNP CBG: No results for input(s): GLUCAP in the last 168 hours. D-Dimer No results for input(s): DDIMER in the last 72 hours. Hgb A1c No results for input(s): HGBA1C in the last 72 hours. Lipid Profile No results for input(s): CHOL, HDL, LDLCALC, TRIG, CHOLHDL, LDLDIRECT in the last 72 hours. Thyroid function studies No results for input(s): TSH, T4TOTAL, T3FREE, THYROIDAB in the last 72 hours.  Invalid input(s): FREET3 Anemia work up No results for input(s): VITAMINB12, FOLATE, FERRITIN, TIBC, IRON, RETICCTPCT in the last 72 hours. Urinalysis    Component Value Date/Time   COLORURINE YELLOW (A) 05/20/2020 1208   APPEARANCEUR CLEAR (A) 05/20/2020 1208   APPEARANCEUR Clear 03/28/2015 1537   LABSPEC 1.004 (L) 05/20/2020 1208   LABSPEC 1.002 12/06/2013 1654   PHURINE 6.0 05/20/2020 1208   GLUCOSEU NEGATIVE 05/20/2020 1208   GLUCOSEU Negative 12/06/2013 1654   HGBUR NEGATIVE 05/20/2020 1208   BILIRUBINUR NEGATIVE 05/20/2020 1208   BILIRUBINUR Negative 03/28/2015 1537   BILIRUBINUR Negative 12/06/2013 [place] 05/20/2020 1208   PROTEINUR NEGATIVE 05/20/2020 1208   NITRITE NEGATIVE 05/20/2020 1208   LEUKOCYTESUR NEGATIVE 05/20/2020 1208   LEUKOCYTESUR Negative 12/06/2013 1654   Sepsis Labs Invalid input(s): PROCALCITONIN,  WBC,  LACTICIDVEN Microbiology Recent Results (from the past 240 hour(s))  Urine culture     Status: None  Collection Time: 05/17/20  4:09 PM   Specimen: Urine, Random  Result Value Ref Range Status   Specimen Description   Final    URINE, RANDOM Performed at [facility], [address] [place], [place] [address]    Special Requests   Final    NONE Performed at [facility], [address], [place], [place] [address]    Culture   Final    NO GROWTH Performed at [facility], [place] [address],  [place], [place] [address]    Report Status 05/19/2020 FINAL  Final  SARS CORONAVIRUS 2 (TAT 6-24 HRS) Nasopharyngeal Nasopharyngeal Swab     Status: None   Collection Time: 05/17/20  8:33 PM   Specimen: Nasopharyngeal Swab  Result Value Ref Range Status   SARS Coronavirus 2 NEGATIVE NEGATIVE Final    Comment: (NOTE) SARS-CoV-2 target nucleic acids are NOT DETECTED.  The SARS-CoV-2 RNA is generally detectable in upper and lower respiratory specimens during the acute phase of infection. Negative results do not preclude SARS-CoV-2 infection, do not rule out co-infections with other pathogens, and should not be used as the sole basis for treatment or other patient management decisions. Negative results must be combined with clinical observations, patient history, and epidemiological information. The expected result is Negative.  Fact Sheet for Patients: SugarRoll.be  Fact Sheet for Healthcare Providers: https://www.woods-mathews.com/  This test is not yet approved or cleared by the [place] FDA and  has been authorized for detection and/or diagnosis of SARS-CoV-2 by FDA under an Emergency Use Authorization (EUA). This EUA will remain  in effect (meaning this test can be used) for the duration of the COVID-19 declaration under Se ction 564(b)(1) of the Act, 21 U.S.C. section 360bbb-3(b)(1), unless the authorization is terminated or revoked sooner.  Performed at [facility], [place] [address], [place], [place] [address]      Time coordinating discharge: 40 minutes  SIGNED:   Elmarie Shiley, MD  [facility]

## 2020-05-22 NOTE — TOC Progression Note (Signed)
Transition of Care [facility]) - Progression Note    Patient Details  Name: [name] MRN: [id number] Date of Birth: 07/02/1932  Transition of Care [facility]) CM/SW [place], LCSW Phone Number: 05/22/2020, 11:42 AM  Clinical Narrative: CSW called and updated daughter regarding PT session this morning. Explained that insurance will not pay for SNF, even if it's just working on her shoulder, with how well she is progressing and that home health would work with her regarding those needs.  Expected Discharge Plan: [place] Barriers to Discharge: No Barriers Identified  Expected Discharge Plan and Services Expected Discharge Plan: [place] arrangements for the past 2 months: Apartment Expected Discharge Date: 05/19/20                         [facility] Arranged: PT,OT,Nurse's Aide,Social Work [employer] Agency: [place] ([place]) Date [place]: 05/19/20   Representative spoke with at [place]: [place] ([place]) Interventions    Readmission Risk Interventions No flowsheet data found.

## 2020-05-22 NOTE — Progress Notes (Addendum)
   05/22/20 1045  Clinical Encounter Type  Visited With Patient  Visit Type Spiritual support;Social support;Initial  Referral From Nurse  Consult/Referral To Chaplain   Chaplain received an Order Requisition stated the PT requested a Bible. Chaplain spoke briefly with the PT, but the nurse was in the process of checking her vitals. Chaplain will attempt to follow-up with PT.

## 2020-05-22 NOTE — Progress Notes (Signed)
Physical Therapy Treatment Patient Details Name: [name] MRN: [id number] DOB: 1932/09/04 Today's Date: 05/22/2020    History of Present Illness [name] is a 87 y.o. female with medical history significant for hypothyroid secondary to thyroidectomy, thyroid nodule status post thyroidectomy (01/2020), on levothyroxine and liothyronine T3, nonhodgkin's lymphoma and completed chemotherapy about 10 years ago,  presents emergency department for chief concerns of frequent fall.  She fell on Tuesday morning, 05/16/20.  She denies remembering whether she felt lightheaded, overt chest pain, shortness of breath, dizziness, palpitations.    PT Comments    Excited to walk and get up this morning.  She is able to exit bed and complete 3 laps with RW and min guard/supervision with no LOB or buckling noted.  Some fatigue noted at end of 3rd lap which is reasonable.  Remained up in chair after session, needs met and alarm on.   Follow Up Recommendations  Home health PT;Supervision for mobility/OOB     Equipment Recommendations  None recommended by PT    Recommendations for Other Services       Precautions / Restrictions Precautions Precautions: Fall Restrictions Weight Bearing Restrictions: No Other Position/Activity Restrictions: new rotator cuff tear. No plans for surgery. No weight bearing restrictions or limitations for functional tasks. Can wear sling for comfort.    Mobility  Bed Mobility Overal bed mobility: Needs Assistance         Sit to supine: Supervision        Transfers Overall transfer level: Needs assistance Equipment used: None Transfers: Sit to/from Stand Sit to Stand: Supervision            Ambulation/Gait Ambulation/Gait assistance: Min guard;Supervision Gait Distance (Feet): 600 Feet Assistive device: Rolling walker (2 wheeled) Gait Pattern/deviations: Step-through pattern;WFL(Within Functional Limits) Gait velocity: WNL   General Gait  Details: steady ambulation with RW use   Stairs             Wheelchair Mobility    Modified Rankin (Stroke Patients Only)       Balance Overall balance assessment: Needs assistance Sitting-balance support: Feet supported Sitting balance-Leahy Scale: Good Sitting balance - Comments: steady sitting reaching outside BOS   Standing balance support: Bilateral upper extremity supported;During functional activity Standing balance-Leahy Scale: Fair                              Cognition Arousal/Alertness: Awake/alert Behavior During Therapy: WFL for tasks assessed/performed Overall Cognitive Status: Within Functional Limits for tasks assessed                                 General Comments: no confusion noted today.  very talkative and seems accurate      Exercises      General Comments        Pertinent Vitals/Pain Pain Assessment: Faces Faces Pain Scale: Hurts little more Pain Location: R shoulder Pain Descriptors / Indicators: Sore Pain Intervention(s): Limited activity within patient's tolerance;Monitored during session    Home Living                      Prior Function            PT Goals (current goals can now be found in the care plan section) Progress towards PT goals: Progressing toward goals    Frequency    Min 2X/week  PT Plan      Co-evaluation              AM-PAC PT "6 Clicks" Mobility   Outcome Measure  Help needed turning from your back to your side while in a flat bed without using bedrails?: None Help needed moving from lying on your back to sitting on the side of a flat bed without using bedrails?: None Help needed moving to and from a bed to a chair (including a wheelchair)?: A Little Help needed standing up from a chair using your arms (e.g., wheelchair or bedside chair)?: A Little Help needed to walk in hospital room?: A Little Help needed climbing 3-5 steps with a railing? : A  Little 6 Click Score: 20    End of Session Equipment Utilized During Treatment: Gait belt Activity Tolerance: Patient tolerated treatment well Patient left: with bed alarm set;in bed;with call bell/phone within reach;with SCD's reapplied Nurse Communication: Mobility status;Precautions PT Visit Diagnosis: Other abnormalities of gait and mobility (R26.89);Unsteadiness on feet (R26.81);Repeated falls (R29.6)     Time: 3419-6222 PT Time Calculation (min) (ACUTE ONLY): 20 min  Charges:  $Gait Training: 8-22 mins                    Chesley Noon, PTA 05/22/20, 9:18 AM

## 2020-06-13 ENCOUNTER — Encounter: Payer: Self-pay | Admitting: Dermatology

## 2020-06-13 ENCOUNTER — Ambulatory Visit (INDEPENDENT_AMBULATORY_CARE_PROVIDER_SITE_OTHER): Payer: 59 | Admitting: Dermatology

## 2020-06-13 ENCOUNTER — Other Ambulatory Visit: Payer: Self-pay

## 2020-06-13 DIAGNOSIS — L578 Other skin changes due to chronic exposure to nonionizing radiation: Secondary | ICD-10-CM | POA: Diagnosis not present

## 2020-06-13 DIAGNOSIS — Z85828 Personal history of other malignant neoplasm of skin: Secondary | ICD-10-CM

## 2020-06-13 DIAGNOSIS — L820 Inflamed seborrheic keratosis: Secondary | ICD-10-CM

## 2020-06-13 DIAGNOSIS — L821 Other seborrheic keratosis: Secondary | ICD-10-CM | POA: Diagnosis not present

## 2020-06-13 DIAGNOSIS — C4492 Squamous cell carcinoma of skin, unspecified: Secondary | ICD-10-CM

## 2020-06-13 DIAGNOSIS — C44729 Squamous cell carcinoma of skin of left lower limb, including hip: Secondary | ICD-10-CM

## 2020-06-13 DIAGNOSIS — D485 Neoplasm of uncertain behavior of skin: Secondary | ICD-10-CM

## 2020-06-13 HISTORY — DX: Squamous cell carcinoma of skin, unspecified: C44.92

## 2020-06-13 NOTE — Patient Instructions (Signed)
Cryotherapy Aftercare  . Wash gently with soap and water everyday.   Marland Kitchen Apply Vaseline and Band-Aid daily until healed.  Wound Care Instructions  1. Cleanse wound gently with soap and water once a day then pat dry with clean gauze. Apply a thing coat of Petrolatum (petroleum jelly, "Vaseline") over the wound (unless you have an allergy to this). We recommend that you use a new, sterile tube of Vaseline. Do not pick or remove scabs. Do not remove the yellow or white "healing tissue" from the base of the wound.  2. Cover the wound with fresh, clean, nonstick gauze and secure with paper tape. You may use Band-Aids in place of gauze and tape if the would is small enough, but would recommend trimming much of the tape off as there is often too much. Sometimes Band-Aids can irritate the skin.  3. You should call the office for your biopsy report after 1 week if you have not already been contacted.  4. If you experience any problems, such as abnormal amounts of bleeding, swelling, significant bruising, significant pain, or evidence of infection, please call the office immediately.  5. FOR ADULT SURGERY PATIENTS: If you need something for pain relief you may take 1 extra strength Tylenol (acetaminophen) AND 2 Ibuprofen (200mg  each) together every 4 hours as needed for pain. (do not take these if you are allergic to them or if you have a reason you should not take them.) Typically, you may only need pain medication for 1 to 3 days.    If you have any questions or concerns for your doctor, please call our main line at [phone] and press option 4 to reach your doctor's medical assistant. If no one answers, please leave a voicemail as directed and we will return your call as soon as possible. Messages left after 4 pm will be answered the following business day.   You may also send [place] a message via [place]. We typically respond to MyChart messages within 1-2 business days.  For prescription refills,  please ask your pharmacy to contact our office. Our fax number is [phone].  If you have an urgent issue when the clinic is closed that cannot wait until the next business day, you can page your doctor at the number below.    Please note that while we do our best to be available for urgent issues outside of office hours, we are not available 24/7.   If you have an urgent issue and are unable to reach [place], you may choose to seek medical care at your doctor's office, retail clinic, urgent care center, or emergency room.  If you have a medical emergency, please immediately call 911 or go to the emergency department.  Pager Numbers  - Dr. Nehemiah Massed: [phone]  - Dr. Laurence Ferrari: [phone]  - Dr. Nicole Kindred: [phone]  In the event of inclement weather, please call our main line at [phone] for an update on the status of any delays or closures.  Dermatology Medication Tips: Please keep the boxes that topical medications come in in order to help keep track of the instructions about where and how to use these. Pharmacies typically print the medication instructions only on the boxes and not directly on the medication tubes.   If your medication is too expensive, please contact our office at [phone] option 4 or send [place] a message through [place].   We are unable to tell what your co-pay for medications will be in advance as this is different depending  on your insurance coverage. However, we may be able to find a substitute medication at lower cost or fill out paperwork to get insurance to cover a needed medication.   If a prior authorization is required to get your medication covered by your insurance company, please allow [place] 1-2 business days to complete this process.  Drug prices often vary depending on where the prescription is filled and some pharmacies may offer cheaper prices.  The website www.goodrx.com contains coupons for medications through different pharmacies. The prices  here do not account for what the cost may be with help from insurance (it may be cheaper with your insurance), but the website can give you the price if you did not use any insurance.  - You can print the associated coupon and take it with your prescription to the pharmacy.  - You may also stop by our office during regular business hours and pick up a GoodRx coupon card.  - If you need your prescription sent electronically to a different pharmacy, notify our office through [facility] or by phone at [phone] option 4.

## 2020-06-13 NOTE — Progress Notes (Signed)
Follow-Up Visit   Subjective  [name] is a 87 y.o. female who presents for the following: Follow-up (Patient has several spots of concern on her lower legs, some are sore and itchy. She has a history of SCC of the right lower calf.).  The following portions of the chart were reviewed this encounter and updated as appropriate:       Review of Systems:  No other skin or systemic complaints except as noted in HPI or Assessment and Plan.  Objective  Well appearing patient in no apparent distress; mood and affect are within normal limits.  A focused examination was performed including face, legs, back. Relevant physical exam findings are noted in the Assessment and Plan.  Objective  Left Thigh x 1, R thigh x 1, L pretibia x 1, R spinal mid back x 4, spinal upper back x 2 (9): Erythematous keratotic or waxy stuck-on papule or plaque.   Objective  Left Mid Pretibia: 8.61mm pink scaly papule- tender to touch       Objective  Right Lower Calf: Well healed scar with no evidence of recurrence.    Assessment & Plan   Actinic Damage - chronic, secondary to cumulative UV radiation exposure/sun exposure over time - diffuse scaly erythematous macules with underlying dyspigmentation - Recommend daily broad spectrum sunscreen SPF 30+ to sun-exposed areas, reapply every 2 hours as needed.  - Recommend staying in the shade or wearing long sleeves, sun glasses (UVA+UVB protection) and wide brim hats (4-inch brim around the entire circumference of the hat). - Call for new or changing lesions.  Seborrheic Keratoses - Stuck-on, waxy, tan-brown papules and/or plaques  - Benign-appearing - Discussed benign etiology and prognosis. - Observe - Call for any changes   Inflamed seborrheic keratosis (9) Left Thigh x 1, R thigh x 1, L pretibia x 1, R spinal mid back x 4, spinal upper back x 2  Prior to procedure, discussed risks of blister formation, small wound, skin dyspigmentation,  or rare scar following cryotherapy.    Destruction of lesion - Left Thigh x 1, R thigh x 1, L pretibia x 1, R spinal mid back x 4, spinal upper back x 2  Destruction method: cryotherapy   Informed consent: discussed and consent obtained   Lesion destroyed using liquid nitrogen: Yes   Region frozen until ice ball extended beyond lesion: Yes   Outcome: patient tolerated procedure well with no complications   Post-procedure details: wound care instructions given    Neoplasm of uncertain behavior of skin Left Mid Pretibia  Skin / nail biopsy Type of biopsy: tangential   Informed consent: discussed and consent obtained   Patient was prepped and draped in usual sterile fashion: Area prepped with alcohol. 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 Observe for recurrence. Call clinic for new or changing lesions.  Recommend regular skin exams, daily broad-spectrum spf 30+ sunscreen use, and photoprotection.     Return pending  biopsy results.   IJamesetta Orleans, CMA, am acting as scribe for Brendolyn Patty, MD .  Documentation: I have reviewed the above documentation for accuracy and completeness, and I agree with the above.  Brendolyn Patty MD

## 2020-06-20 ENCOUNTER — Telehealth: Payer: Self-pay

## 2020-06-20 NOTE — Telephone Encounter (Signed)
-----   Message from Brendolyn Patty, MD sent at 06/16/2020 11:17 AM EDT ----- Skin , left mid pretibia WELL DIFFERENTIATED SQUAMOUS CELL CARCINOMA  SCC skin cancer- needs EDC

## 2020-06-20 NOTE — Telephone Encounter (Signed)
Left pt's sister, Inez Catalina, message to call for pt's bx results./sh

## 2020-06-26 ENCOUNTER — Other Ambulatory Visit: Payer: Self-pay

## 2020-06-26 ENCOUNTER — Ambulatory Visit (INDEPENDENT_AMBULATORY_CARE_PROVIDER_SITE_OTHER): Payer: Medicare Other | Admitting: Dermatology

## 2020-06-26 DIAGNOSIS — C44729 Squamous cell carcinoma of skin of left lower limb, including hip: Secondary | ICD-10-CM

## 2020-06-26 DIAGNOSIS — L820 Inflamed seborrheic keratosis: Secondary | ICD-10-CM | POA: Diagnosis not present

## 2020-06-26 DIAGNOSIS — I872 Venous insufficiency (chronic) (peripheral): Secondary | ICD-10-CM | POA: Diagnosis not present

## 2020-06-26 DIAGNOSIS — I87302 Chronic venous hypertension (idiopathic) without complications of left lower extremity: Secondary | ICD-10-CM | POA: Diagnosis not present

## 2020-06-26 DIAGNOSIS — C4492 Squamous cell carcinoma of skin, unspecified: Secondary | ICD-10-CM

## 2020-06-26 MED ORDER — DOXYCYCLINE MONOHYDRATE 100 MG PO TABS: 100.0000 mg | ORAL_TABLET | Freq: Two times a day (BID) | ORAL | 0 refills | Status: AC

## 2020-06-26 MED ORDER — MUPIROCIN 2 % EX OINT: TOPICAL_OINTMENT | CUTANEOUS | 2 refills | Status: DC

## 2020-06-26 NOTE — Patient Instructions (Addendum)
Stasis in the legs causes chronic leg swelling, which may result in itchy or painful rashes, skin discoloration, skin texture changes, and sometimes ulceration.  Recommend daily compression hose/stockings- easiest to put on first thing in morning, remove at bedtime.  Elevate legs as much as possible. Avoid salt/sodium rich foods.  Start doxycycline 100mg  take 1 pill twice daily with food x 14 days. Restart mupirocin 2% ointment - Apply to affected areas once a day followed by bandage.  If you have any questions or concerns for your doctor, please call our main line at [phone] and press option 4 to reach your doctor's medical assistant. If no one answers, please leave a voicemail as directed and we will return your call as soon as possible. Messages left after 4 pm will be answered the following business day.   You may also send [place] a message via [place]. We typically respond to MyChart messages within 1-2 business days.  For prescription refills, please ask your pharmacy to contact our office. Our fax number is [phone].  If you have an urgent issue when the clinic is closed that cannot wait until the next business day, you can page your doctor at the number below.    Please note that while we do our best to be available for urgent issues outside of office hours, we are not available 24/7.   If you have an urgent issue and are unable to reach [place], you may choose to seek medical care at your doctor's office, retail clinic, urgent care center, or emergency room.  If you have a medical emergency, please immediately call 911 or go to the emergency department.  Pager Numbers  - Dr. Nehemiah Massed: [phone]  - Dr. Laurence Ferrari: [phone]  - Dr. Nicole Kindred: [phone]  In the event of inclement weather, please call our main line at [phone] for an update on the status of any delays or closures.  Dermatology Medication Tips: Please keep the boxes that topical medications come in in order to  help keep track of the instructions about where and how to use these. Pharmacies typically print the medication instructions only on the boxes and not directly on the medication tubes.   If your medication is too expensive, please contact our office at [phone] option 4 or send [place] a message through [place].   We are unable to tell what your co-pay for medications will be in advance as this is different depending on your insurance coverage. However, we may be able to find a substitute medication at lower cost or fill out paperwork to get insurance to cover a needed medication.   If a prior authorization is required to get your medication covered by your insurance company, please allow [place] 1-2 business days to complete this process.  Drug prices often vary depending on where the prescription is filled and some pharmacies may offer cheaper prices.  The website www.goodrx.com contains coupons for medications through different pharmacies. The prices here do not account for what the cost may be with help from insurance (it may be cheaper with your insurance), but the website can give you the price if you did not use any insurance.  - You can print the associated coupon and take it with your prescription to the pharmacy.  - You may also stop by our office during regular business hours and pick up a GoodRx coupon card.  - If you need your prescription sent electronically to a different pharmacy, notify our office through [facility] or by  phone at [phone] option 4.

## 2020-06-26 NOTE — Progress Notes (Signed)
   Follow-Up Visit   Subjective  [name] is a 87 y.o. female who presents for the following: Follow-up (Patient here to have ISKs rechecked that were treated with cryotherapy 06/13/2020. Areas have been throbbing and lower legs are swollen. She is not on a diuretic and not wearing compression. She also has an SCC, biopsy proven, of the left mid pretibia. Discussed pathology results with patient today. Will need EDC.).  Patient here with her daughter who contributes to history.  The following portions of the chart were reviewed this encounter and updated as appropriate:       Review of Systems:  No other skin or systemic complaints except as noted in HPI or Assessment and Plan.  Objective  Well appearing patient in no apparent distress; mood and affect are within normal limits.  A focused examination was performed including face, legs. Relevant physical exam findings are noted in the Assessment and Plan.  Objective  Left Mid Pretibia: 5.0 x 4.77mm ulceration  Objective  Left Thigh, Right thigh, L pretibia: Crusted hemorrhagic papules  Objective  Left Lower Leg: Erythema and 2-3+ pitting edema.   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 Discussed results with patient and daughter today.  Observation vs EDC on follow-up after stasis dermatitis is improved.  Continue wound care with mupirocin 2% ointment QD with bandage changes.   Inflamed seborrheic keratosis Left Thigh, Right thigh, L pretibia  Healing from cryotherapy.  Continue mupirocin 2% ointment qd with bandage changes until improved.  Stasis edema of left lower extremity  Venous stasis dermatitis of left lower extremity Left Lower Leg  With flare Stasis in the legs causes chronic leg swelling, which may result in itchy or painful rashes, skin discoloration, skin texture changes, and sometimes ulceration.  Recommend daily compression hose/stockings- easiest  to put on first thing in morning, remove at bedtime.  Elevate legs as much as possible. Avoid salt/sodium rich foods.  Start doxycycline 100mg  take 1 po BID with food x 14 days 0Rf. Restart mupirocin 2% ointment Apply QD to open sores with bandage changes dsp 22g 2Rf. Compression hose qam- very important! Also discussed UNNA boot  Doxycycline should be taken with food to prevent nausea. Do not lay down for 30 minutes after taking. Be cautious with sun exposure and use good sun protection while on this medication. Pregnant women should not take this medication.    doxycycline (ADOXA) 100 MG tablet - Left Lower Leg  mupirocin ointment (BACTROBAN) 2 % - Left Lower Leg  Return in about 4 weeks (around 07/24/2020) for f/u SCC, Stasis.   IJamesetta Orleans, CMA, am acting as scribe for Brendolyn Patty, MD .  Documentation: I have reviewed the above documentation for accuracy and completeness, and I agree with the above.  Brendolyn Patty MD

## 2020-07-18 ENCOUNTER — Ambulatory Visit (INDEPENDENT_AMBULATORY_CARE_PROVIDER_SITE_OTHER): Payer: Medicare Other | Admitting: Dermatology

## 2020-07-18 ENCOUNTER — Encounter: Payer: Self-pay | Admitting: Dermatology

## 2020-07-18 ENCOUNTER — Other Ambulatory Visit: Payer: Self-pay

## 2020-07-18 DIAGNOSIS — I83028 Varicose veins of left lower extremity with ulcer other part of lower leg: Secondary | ICD-10-CM | POA: Diagnosis not present

## 2020-07-18 DIAGNOSIS — I872 Venous insufficiency (chronic) (peripheral): Secondary | ICD-10-CM

## 2020-07-18 DIAGNOSIS — Z85828 Personal history of other malignant neoplasm of skin: Secondary | ICD-10-CM | POA: Diagnosis not present

## 2020-07-18 DIAGNOSIS — L97821 Non-pressure chronic ulcer of other part of left lower leg limited to breakdown of skin: Secondary | ICD-10-CM

## 2020-07-18 NOTE — Progress Notes (Signed)
   Follow-Up Visit   Subjective  [name] is a 87 y.o. female who presents for the following: Follow-up (Patient here today for concerning left leg. Patient is concerned it is not healing and she is having a lot of swelling. Patient is states the area is giving her a hard time and draining. Patient has been wearing compressions stockings, using mupirocin and taking doxycycline. ).  Scc not yet treated but appears clear   The following portions of the chart were reviewed this encounter and updated as appropriate:        Objective  Well appearing patient in no apparent distress; mood and affect are within normal limits.  A focused examination was performed including legs. Relevant physical exam findings are noted in the Assessment and Plan.  Objective  Left Lower Leg - Anterior: Erythema with 3+ pitting edema tender to touch L lower leg  Objective  Left Lower Leg - Anterior: 6 mm full thickness ulceration, tender to touch, different spot than SCC biopsy site which has healed without recurrence  Images      Assessment & Plan  Venous stasis dermatitis of left lower extremity Left Lower Leg - Anterior  Discussed referral to wound clinic- need for more aggressive compression.  UNNA boot applied today Start TMC 0.1% cream to L lower leg bid prn itchy rash if not in [employer] boot  Adjustment in diuretic dose may be needed  Stasis in the legs causes chronic leg swelling, which may result in itchy or painful rashes, skin discoloration, skin texture changes, and sometimes ulceration.  Recommend daily compression hose/stockings- easiest to put on first thing in morning, remove at bedtime.  Elevate legs as much as possible. Avoid salt/sodium rich foods.   Other Related Medications mupirocin ointment (BACTROBAN) 2 %  Venous stasis ulcer of other part of left lower leg limited to breakdown of skin, unspecified whether varicose veins present (HCC) Left Lower Leg -  Anterior  Referral to wound care clinic for stasis ulcer  Today in office- Mupirocin to ulceration followed by Apexicon cream to surrounding skin, followed by Rolena Infante.  Advised patient to leave on until appointment at wound care clinic this week.  continue Doxycycline 100 mg PO bid  Ambulatory referral to Wound Clinic - Left Lower Leg - Anterior  History of Squamous Cell Carcinoma of the Skin - No evidence of recurrence today of L mid pretibia from biopsy only, not yet treated.  Will continue to observe.  If recurs will EDC once leg swelling/stasis dermatitis is improved. - Recommend regular full body skin exams - Recommend daily broad spectrum sunscreen SPF 30+ to sun-exposed areas, reapply every 2 hours as needed.  - Call if any new or changing lesions are noted between office visits    Return for as scheduled .   I, Ruthell Rummage, CMA, am acting as scribe for Brendolyn Patty, MD.  Documentation: I have reviewed the above documentation for accuracy and completeness, and I agree with the above.  Brendolyn Patty MD

## 2020-07-18 NOTE — Patient Instructions (Signed)

## 2020-07-21 ENCOUNTER — Other Ambulatory Visit: Payer: Self-pay

## 2020-07-21 ENCOUNTER — Encounter: Payer: Medicare Other | Attending: Internal Medicine | Admitting: Internal Medicine

## 2020-07-21 DIAGNOSIS — N186 End stage renal disease: Secondary | ICD-10-CM | POA: Insufficient documentation

## 2020-07-21 DIAGNOSIS — L97828 Non-pressure chronic ulcer of other part of left lower leg with other specified severity: Secondary | ICD-10-CM | POA: Insufficient documentation

## 2020-07-21 DIAGNOSIS — I480 Paroxysmal atrial fibrillation: Secondary | ICD-10-CM | POA: Insufficient documentation

## 2020-07-21 DIAGNOSIS — I872 Venous insufficiency (chronic) (peripheral): Secondary | ICD-10-CM | POA: Diagnosis not present

## 2020-07-21 DIAGNOSIS — I132 Hypertensive heart and chronic kidney disease with heart failure and with stage 5 chronic kidney disease, or end stage renal disease: Secondary | ICD-10-CM | POA: Diagnosis not present

## 2020-07-21 DIAGNOSIS — Z8572 Personal history of non-Hodgkin lymphomas: Secondary | ICD-10-CM | POA: Insufficient documentation

## 2020-07-21 DIAGNOSIS — Z955 Presence of coronary angioplasty implant and graft: Secondary | ICD-10-CM | POA: Diagnosis not present

## 2020-07-21 DIAGNOSIS — I509 Heart failure, unspecified: Secondary | ICD-10-CM | POA: Diagnosis not present

## 2020-07-21 DIAGNOSIS — I2510 Atherosclerotic heart disease of native coronary artery without angina pectoris: Secondary | ICD-10-CM | POA: Insufficient documentation

## 2020-07-21 DIAGNOSIS — Z882 Allergy status to sulfonamides status: Secondary | ICD-10-CM | POA: Diagnosis not present

## 2020-07-21 DIAGNOSIS — Z9221 Personal history of antineoplastic chemotherapy: Secondary | ICD-10-CM | POA: Diagnosis not present

## 2020-07-21 DIAGNOSIS — Z881 Allergy status to other antibiotic agents status: Secondary | ICD-10-CM | POA: Diagnosis not present

## 2020-07-21 DIAGNOSIS — G9009 Other idiopathic peripheral autonomic neuropathy: Secondary | ICD-10-CM | POA: Insufficient documentation

## 2020-07-21 DIAGNOSIS — G629 Polyneuropathy, unspecified: Secondary | ICD-10-CM | POA: Insufficient documentation

## 2020-07-21 NOTE — Progress Notes (Signed)
[name], [name] ([id number]) Visit Report for 07/21/2020 Abuse/Suicide Risk Screen Details Patient Name: [name], [name]. Date of Service: 07/21/2020 8:30 AM Medical Record Number: [id number] Patient Account Number: [id number] Date of Birth/Sex: [birth year] (87 y.o. F) Treating RN: Cornell Barman Primary Care Isadore Bokhari: Tracie Harrier Other Clinician: Referring Rook Maue: Brendolyn Patty Treating Johnda Billiot/Extender: Tito Dine in Treatment: 0 Abuse/Suicide Risk Screen Items Answer ABUSE RISK SCREEN: Has anyone close to you tried to hurt or harm you recentlyo No Do you feel uncomfortable with anyone in your familyo No Has anyone forced you do things that you didnot want to doo No Electronic Signature(s) Signed: 07/21/2020 3:58:10 PM By: Gretta Cool, BSN, RN, CWS, Kim RN, BSN Entered By: Gretta Cool, BSN, RN, CWS, Kim on 07/21/2020 09:05:01 Griffee, [name] ([id number]) -------------------------------------------------------------------------------- Activities of Daily Living Details Patient Name: [name], [name]. Date of Service: 07/21/2020 8:30 AM Medical Record Number: [id number] Patient Account Number: [id number] Date of Birth/Sex: 06-09-1932 (87 y.o. F) Treating RN: Cornell Barman Primary Care Etienne Millward: Tracie Harrier Other Clinician: Referring Lorik Guo: Brendolyn Patty Treating Rai Severns/Extender: Tito Dine in Treatment: 0 Activities of Daily Living Items Answer Activities of Daily Living (Please select one for each item) Drive Automobile Not Able Take Medications Completely Able Use Telephone Completely Able Care for Appearance Completely Able Use Toilet Completely Able Bath / Shower Completely Able Dress Self Completely Able Feed Self Completely Able Walk Completely Able Get In / Out Bed Completely Able Housework Completely Able Prepare Meals Completely [place] for Self Completely Able Notes PT come in for shoulder. Electronic  Signature(s) Signed: 07/21/2020 3:58:10 PM By: Gretta Cool, BSN, RN, CWS, Kim RN, BSN Entered By: Gretta Cool, BSN, RN, CWS, Kim on 07/21/2020 09:06:21 [name] ([id number]) -------------------------------------------------------------------------------- Education Screening Details Patient Name: [name], [name]. Date of Service: 07/21/2020 8:30 AM Medical Record Number: [id number] Patient Account Number: [id number] Date of Birth/Sex: 1932/09/30 (87 y.o. F) Treating RN: Cornell Barman Primary Care Jaina Morin: Tracie Harrier Other Clinician: Referring Juelz Whittenberg: Brendolyn Patty Treating Hanny Elsberry/Extender: Tito Dine in Treatment: 0 Learning Preferences/Education Level/Primary Language Learning Preference: Explanation, Demonstration Highest Education Level: Grade School Preferred Language: English Cognitive Barrier Language Barrier: No Translator Needed: No Memory Deficit: No Emotional Barrier: No Physical Barrier Impaired Vision: No Impaired Hearing: No Knowledge/Comprehension Knowledge Level: Medium Comprehension Level: Medium Ability to understand written instructions: Medium Ability to understand verbal instructions: Medium Motivation Anxiety Level: Calm Cooperation: Cooperative Education Importance: Acknowledges Need Interest in Health Problems: Asks Questions Perception: Confused Willingness to Engage in Self-Management High Activities: Readiness to Engage in Self-Management High Activities: Engineer, maintenance) Signed: 07/21/2020 3:58:10 PM By: Gretta Cool, BSN, RN, CWS, Kim RN, BSN Entered By: Gretta Cool, BSN, RN, CWS, Kim on 07/21/2020 09:06:58 [name] ([id number]) -------------------------------------------------------------------------------- Fall Risk Assessment Details Patient Name: [name] Date of Service: 07/21/2020 8:30 AM Medical Record Number: [id number] Patient Account Number: [id number] Date of Birth/Sex: 1932/07/22 (87 y.o. F) Treating RN:  Cornell Barman Primary Care Wilmore Holsomback: Tracie Harrier Other Clinician: Referring Oliviana Mcgahee: Brendolyn Patty Treating Nyair Depaulo/Extender: Tito Dine in Treatment: 0 Fall Risk Assessment Items Have you had 2 or more falls in the last 12 monthso 0 Yes Have you had any fall that resulted in injury in the last 12 monthso 0 Yes FALLS RISK SCREEN History of falling - immediate or within 3 months 0 No Secondary diagnosis (Do you have 2 or more medical diagnoseso) 0 No Ambulatory aid None/bed rest/wheelchair/nurse 0 Yes Crutches/cane/walker 15 Yes Furniture 0 No  Intravenous therapy Access/Saline/Heparin Lock 0 No Gait/Transferring Normal/ bed rest/ wheelchair 0 No Weak (short steps with or without shuffle, stooped but able to lift head while walking, may 10 Yes seek support from furniture) Impaired (short steps with shuffle, may have difficulty arising from chair, head down, impaired 0 No balance) Mental Status Oriented to own ability 0 No Electronic Signature(s) Signed: 07/21/2020 3:58:10 PM By: Gretta Cool, BSN, RN, CWS, Kim RN, BSN Entered By: Gretta Cool, BSN, RN, CWS, Kim on 07/21/2020 09:07:58 Solow, [name] ([id number]) -------------------------------------------------------------------------------- Foot Assessment Details Patient Name: [name], [name]. Date of Service: 07/21/2020 8:30 AM Medical Record Number: [id number] Patient Account Number: [id number] Date of Birth/Sex: 09/28/1932 (87 y.o. F) Treating RN: Cornell Barman Primary Care Aleria Maheu: Tracie Harrier Other Clinician: Referring Verbie Babic: Brendolyn Patty Treating Laird Runnion/Extender: Tito Dine in Treatment: 0 Foot Assessment Items Site Locations + = Sensation present, - = Sensation absent, C = Callus, U = Ulcer R = Redness, W = Warmth, M = Maceration, PU = Pre-ulcerative lesion F = Fissure, S = Swelling, D = Dryness Assessment Right: Left: Other Deformity: No No Prior Foot Ulcer: No No Prior Amputation: No  No Charcot Joint: No No Ambulatory Status: Ambulatory With Help Assistance Device: Cane Gait: Steady Notes Walked in with daughter's assistance. Uses can or walker when she wants too. Electronic Signature(s) Signed: 07/21/2020 3:58:10 PM By: Gretta Cool, BSN, RN, CWS, Kim RN, BSN Entered By: Gretta Cool, BSN, RN, CWS, Kim on 07/21/2020 09:09:34 [name] ([id number]) -------------------------------------------------------------------------------- Nutrition Risk Screening Details Patient Name: [name], [name]. Date of Service: 07/21/2020 8:30 AM Medical Record Number: [id number] Patient Account Number: [id number] Date of Birth/Sex: 08-26-1932 (87 y.o. F) Treating RN: Cornell Barman Primary Care Kerrie Timm: Tracie Harrier Other Clinician: Referring Dade Rodin: Brendolyn Patty Treating Shatha Hooser/Extender: Tito Dine in Treatment: 0 Height (in): 60 Weight (lbs): 155 Body Mass Index (BMI): 30.3 Nutrition Risk Screening Items Score Screening NUTRITION RISK SCREEN: I have an illness or condition that made me change the kind and/or amount of food I eat 0 No I eat fewer than two meals per day 0 No I eat few fruits and vegetables, or milk products 0 No I have three or more drinks of beer, liquor or wine almost every day 0 No I have tooth or mouth problems that make it hard for me to eat 0 No I don't always have enough money to buy the food I need 0 No I eat alone most of the time 0 No I take three or more different prescribed or over-the-counter drugs a day 1 Yes Without wanting to, I have lost or gained 10 pounds in the last six months 0 No I am not always physically able to shop, cook and/or feed myself 0 No Nutrition Protocols Good Risk Protocol Moderate Risk Protocol High Risk Proctocol Risk Level: Good Risk Score: 1 Electronic Signature(s) Signed: 07/21/2020 3:58:10 PM By: Gretta Cool, BSN, RN, CWS, Kim RN, BSN Entered By: Gretta Cool, BSN, RN, CWS, Kim on 07/21/2020 09:08:35

## 2020-07-25 ENCOUNTER — Ambulatory Visit: Payer: Medicare Other | Admitting: Dermatology

## 2020-07-25 NOTE — Progress Notes (Signed)
DNASIA, GAUNA ([id number]) Visit Report for 07/21/2020 Chief Complaint Document Details Patient Name: [name], [name]. Date of Service: 07/21/2020 8:30 AM Medical Record Number: [id number] Patient Account Number: [id number] Date of Birth/Sex: 1932/03/13 (87 y.o. F) Treating RN: Carlene Coria Primary Care Provider: Tracie Harrier Other Clinician: Referring Provider: Brendolyn Patty Treating Provider/Extender: Tito Dine in Treatment: 0 Information Obtained from: Patient Chief Complaint 07/21/2020; patient is here for review of a small wound on the left anterior mid tibia. Electronic Signature(s) Signed: 07/21/2020 4:35:25 PM By: Linton Ham MD Entered By: Linton Ham on 07/21/2020 09:51:13 Foos, [name] ([id number]) -------------------------------------------------------------------------------- Debridement Details Patient Name: [name] Date of Service: 07/21/2020 8:30 AM Medical Record Number: [id number] Patient Account Number: [id number] Date of Birth/Sex: 14-Nov-1932 (87 y.o. F) Treating RN: Carlene Coria Primary Care Provider: Tracie Harrier Other Clinician: Referring Provider: Brendolyn Patty Treating Provider/Extender: Tito Dine in Treatment: 0 Debridement Performed for Wound #1 Left,Midline Lower Leg Assessment: Performed By: Physician Ricard Dillon, MD Debridement Type: Debridement Severity of Tissue Pre Debridement: Fat layer exposed Level of Consciousness (Pre- Awake and Alert procedure): Pre-procedure Verification/Time Out Yes - 09:25 Taken: Start Time: 09:25 Pain Control: Lidocaine 4% Topical Solution Total Area Debrided (L x W): 0.5 (cm) x 0.7 (cm) = 0.35 (cm) Tissue and other material Viable, Non-Viable, Slough, Subcutaneous, Skin: Dermis , Skin: Epidermis, Slough debrided: Level: Skin/Subcutaneous Tissue Debridement Description: Excisional Instrument: Curette Bleeding: Minimum Hemostasis Achieved:  Pressure End Time: 09:28 Procedural Pain: 0 Post Procedural Pain: 0 Response to Treatment: Procedure was tolerated well Level of Consciousness (Post- Awake and Alert procedure): Post Debridement Measurements of Total Wound Length: (cm) 0.5 Width: (cm) 0.7 Depth: (cm) 0.2 Volume: (cm) 0.055 Character of Wound/Ulcer Post Debridement: Improved Severity of Tissue Post Debridement: Fat layer exposed Post Procedure Diagnosis Same as Pre-procedure Electronic Signature(s) Signed: 07/21/2020 4:35:25 PM By: Linton Ham MD Signed: 07/25/2020 3:50:09 PM By: Carlene Coria RN Entered By: Linton Ham on 07/21/2020 09:50:19 Haddon, [name] ([id number]) -------------------------------------------------------------------------------- HPI Details Patient Name: [name] Date of Service: 07/21/2020 8:30 AM Medical Record Number: [id number] Patient Account Number: [id number] Date of Birth/Sex: 1932/06/08 (87 y.o. F) Treating RN: Carlene Coria Primary Care Provider: Tracie Harrier Other Clinician: Referring Provider: Brendolyn Patty Treating Provider/Extender: Tito Dine in Treatment: 0 History of Present Illness HPI Description: ADMISSION 07/21/2020 This is an 87 year old woman who arrived in clinic accompanied by her daughter. Referred by Dr. Brendolyn Patty her dermatologist. She is felt to have venous insufficiency causing the swelling and skin breakdown. I note that she is also had a skin cancer recently removed just above where the wound is but Dr. Nicole Kindred did not feel that needed to be biopsied per the patient. She is also had a history of a skin cancer on the right posterior calf at some time in the past. Dr. Nicole Kindred placed an Louretta Parma boot on her the last time she was seen on 07/18/2020 she took this off at home because it was too tight and uncomfortable the wound itself has been there for about a month. She came in with a Band-Aid over this and multiple Band-Aids on her leg  so that this is the only open area I could see. She complains of a lot of pain in both legs that does not seem particularly activity related. She says she has a neuropathy related to a past history of chemotherapy for her lymphoma Past medical history includes neuropathy, gout, chronic kidney disease stage III,  lymphedema, coronary artery disease with a stent, stasis dermatitis, thyroidectomy in 2021, low back pain, bilateral lower extremity edema. Paroxysmal atrial fibrillation and squamous cell CA. She was recently in hospital for congestive heart failure and delirium ABI in our clinic was 1.31 on the left Electronic Signature(s) Signed: 07/21/2020 4:35:25 PM By: Linton Ham MD Entered By: Linton Ham on 07/21/2020 09:53:59 Charlie, [name] ([id number]) -------------------------------------------------------------------------------- Physical Exam Details Patient Name: [name], SMOOT. Date of Service: 07/21/2020 8:30 AM Medical Record Number: [id number] Patient Account Number: [id number] Date of Birth/Sex: 11-12-1932 (87 y.o. F) Treating RN: Carlene Coria Primary Care Provider: Tracie Harrier Other Clinician: Referring Provider: Brendolyn Patty Treating Provider/Extender: Tito Dine in Treatment: 0 Constitutional Sitting or standing Blood Pressure is within target range for patient.. Pulse regular and within target range for patient.Marland Kitchen Respirations regular, non- labored and within target range.. Temperature is normal and within the target range for the patient.Marland Kitchen appears in no distress. Respiratory Respiratory effort is easy and symmetric bilaterally. Rate is normal at rest and on room air.. Bilateral breath sounds are clear and equal in all lobes with no wheezes, rales or rhonchi.. Cardiovascular No signs of congestive heart failure no murmurs jugular venous pressure not elevated. Pedal pulses were robust at both the dorsalis pedis and posterior tibial. Significant edema  in both legs left more than right. This is pitting no signs of an acute DVT.Marland Kitchen Notes Wound exam; she has a small area on the left anterior mid tibia. This does not look infected and would not obviously seem to be a source of severe pain. I used a #3 curette to clean off the surface of this to healthier looking granulation. Hemostasis with direct pressure. Again no evidence of surrounding infection Electronic Signature(s) Signed: 07/21/2020 4:35:25 PM By: Linton Ham MD Entered By: Linton Ham on 07/21/2020 09:55:52 Eifler, [name] ([id number]) -------------------------------------------------------------------------------- Physician Orders Details Patient Name: [name] Date of Service: 07/21/2020 8:30 AM Medical Record Number: [id number] Patient Account Number: [id number] Date of Birth/Sex: 02-21-1933 (87 y.o. F) Treating RN: Carlene Coria Primary Care Provider: Tracie Harrier Other Clinician: Referring Provider: Brendolyn Patty Treating Provider/Extender: Tito Dine in Treatment: 0 Verbal / Phone Orders: No Diagnosis Coding Follow-up Appointments o Return Appointment in 1 week. Bathing/ Shower/ Hygiene o May shower with wound dressing protected with water repellent cover or cast protector. Edema Control - Lymphedema / Segmental Compressive Device / Other o Optional: One layer of unna paste to top of compression wrap (to act as an anchor). o Elevate, Exercise Daily and Avoid Standing for Long Periods of Time. o Elevate legs to the level of the heart and pump ankles as often as possible o Elevate leg(s) parallel to the floor when sitting. Wound Treatment Wound #1 - Lower Leg Wound Laterality: Left, Midline Cleanser: Soap and Water 1 x Per Week/30 Days Discharge Instructions: Gently cleanse wound with antibacterial soap, rinse and pat dry prior to dressing wounds Primary Dressing: IODOFLEX 0.9% Cadexomer Iodine Pad 1 x Per Week/30 Days Discharge  Instructions: Apply Iodoflex to wound bed only as directed. Secondary Dressing: ABD Pad 5x9 (in/in) 1 x Per Week/30 Days Discharge Instructions: Cover with ABD pad Compression Wrap: Profore Lite LF 3 Multilayer Compression Bandaging System 1 x Per Week/30 Days Discharge Instructions: Apply 3 multi-layer wrap as prescribed. Electronic Signature(s) Signed: 07/21/2020 4:35:25 PM By: Linton Ham MD Signed: 07/25/2020 3:50:09 PM By: Carlene Coria RN Entered By: Carlene Coria on 07/21/2020 09:29:25 Bina, [name] ([id number]) -------------------------------------------------------------------------------- Problem  List Details Patient Name: AMARE, KONTOS. Date of Service: 07/21/2020 8:30 AM Medical Record Number: [id number] Patient Account Number: [id number] Date of Birth/Sex: 1932-08-18 (87 y.o. F) Treating RN: Carlene Coria Primary Care Provider: Tracie Harrier Other Clinician: Referring Provider: Brendolyn Patty Treating Provider/Extender: Tito Dine in Treatment: 0 Active Problems ICD-10 Encounter Code Description Active Date MDM Diagnosis I87.332 Chronic venous hypertension (idiopathic) with ulcer and inflammation of 07/21/2020 No Yes left lower extremity L97.828 Non-pressure chronic ulcer of other part of left lower leg with other 07/21/2020 No Yes specified severity G90.09 Other idiopathic peripheral autonomic neuropathy 07/21/2020 No Yes Inactive Problems Resolved Problems Electronic Signature(s) Signed: 07/21/2020 4:35:25 PM By: Linton Ham MD Entered By: Linton Ham on 07/21/2020 09:50:01 Kyne, [name] ([id number]) -------------------------------------------------------------------------------- Progress Note Details Patient Name: [name] Date of Service: 07/21/2020 8:30 AM Medical Record Number: [id number] Patient Account Number: [id number] Date of Birth/Sex: 1932/07/25 (87 y.o. F) Treating RN: Carlene Coria Primary Care Provider: Tracie Harrier Other Clinician: Referring Provider: Brendolyn Patty Treating Provider/Extender: Tito Dine in Treatment: 0 Subjective Chief Complaint Information obtained from Patient 07/21/2020; patient is here for review of a small wound on the left anterior mid tibia. History of Present Illness (HPI) ADMISSION 07/21/2020 This is an 87 year old woman who arrived in clinic accompanied by her daughter. Referred by Dr. Brendolyn Patty her dermatologist. She is felt to have venous insufficiency causing the swelling and skin breakdown. I note that she is also had a skin cancer recently removed just above where the wound is but Dr. Nicole Kindred did not feel that needed to be biopsied per the patient. She is also had a history of a skin cancer on the right posterior calf at some time in the past. Dr. Nicole Kindred placed an Louretta Parma boot on her the last time she was seen on 07/18/2020 she took this off at home because it was too tight and uncomfortable the wound itself has been there for about a month. She came in with a Band-Aid over this and multiple Band-Aids on her leg so that this is the only open area I could see. She complains of a lot of pain in both legs that does not seem particularly activity related. She says she has a neuropathy related to a past history of chemotherapy for her lymphoma Past medical history includes neuropathy, gout, chronic kidney disease stage III, lymphedema, coronary artery disease with a stent, stasis dermatitis, thyroidectomy in 2021, low back pain, bilateral lower extremity edema. Paroxysmal atrial fibrillation and squamous cell CA. She was recently in hospital for congestive heart failure and delirium ABI in our clinic was 1.31 on the left Patient History Information obtained from Patient. Allergies Sulfa (Sulfonamide Antibiotics), cefdinir, cefuroxime Family History Diabetes - Siblings. Social History Never smoker, Marital Status - Widowed, Alcohol Use - Never, Drug  Use - No History, Caffeine Use - Rarely. Medical History Hematologic/Lymphatic Patient has history of Lymphedema Respiratory Patient has history of Chronic Obstructive Pulmonary Disease (COPD) Cardiovascular Patient has history of Congestive Heart Failure - stent, Coronary Artery Disease Denies history of Deep Vein Thrombosis, Hypertension, Hypotension, Myocardial Infarction, Peripheral Arterial Disease, Peripheral Venous Disease Endocrine Denies history of Type I Diabetes, Type II Diabetes Genitourinary Patient has history of End Stage Renal Disease - Stage III-not treated Musculoskeletal Patient has history of Gout - feet, Osteoarthritis Neurologic Patient has history of Neuropathy - feet Oncologic Patient has history of Received Chemotherapy Hospitalization/Surgery History - April-dehydration. Medical And Surgical History Notes Oncologic Lymphoma 2009  Review of Systems (ROS) Constitutional Symptoms (General Health) Denies complaints or symptoms of Chills. Eyes TONAE, LIVOLSI. ([id number]) Complains or has symptoms of Glasses / Contacts. Ear/Nose/Mouth/Throat Denies complaints or symptoms of Difficult clearing ears, Sinusitis. Hematologic/Lymphatic Denies complaints or symptoms of Bleeding / Clotting Disorders, Human Immunodeficiency Virus. Cardiovascular Complains or has symptoms of LE edema. Gastrointestinal Denies complaints or symptoms of Frequent diarrhea, Nausea, Vomiting. Endocrine Complains or has symptoms of Thyroid disease - removed 2021. Denies complaints or symptoms of Hepatitis, Polydypsia (Excessive Thirst). Immunological Denies complaints or symptoms of Hives, Itching. Integumentary (Skin) Complains or has symptoms of Wounds. Denies complaints or symptoms of Bleeding or bruising tendency, Breakdown, Swelling, Skin cancer removed, one on each leg. Psychiatric Complains or has symptoms of Anxiety, Claustrophobia. Objective Constitutional Sitting or  standing Blood Pressure is within target range for patient.. Pulse regular and within target range for patient.Marland Kitchen Respirations regular, non- labored and within target range.. Temperature is normal and within the target range for the patient.Marland Kitchen appears in no distress. Vitals Time Taken: 8:52 AM, Height: 60 in, Weight: 155 lbs, BMI: 30.3, Temperature: 98.3 F, Pulse: 98 bpm, Respiratory Rate: 18 breaths/min, Blood Pressure: 144/82 mmHg. Respiratory Respiratory effort is easy and symmetric bilaterally. Rate is normal at rest and on room air.. Bilateral breath sounds are clear and equal in all lobes with no wheezes, rales or rhonchi.. Cardiovascular No signs of congestive heart failure no murmurs jugular venous pressure not elevated. Pedal pulses were robust at both the dorsalis pedis and posterior tibial. Significant edema in both legs left more than right. This is pitting no signs of an acute DVT.Marland Kitchen General Notes: Wound exam; she has a small area on the left anterior mid tibia. This does not look infected and would not obviously seem to be a source of severe pain. I used a #3 curette to clean off the surface of this to healthier looking granulation. Hemostasis with direct pressure. Again no evidence of surrounding infection Integumentary (Hair, Skin) Wound #1 status is Open. Original cause of wound was Gradually Appeared. The date acquired was: 06/21/2020. The wound is located on the Left,Midline Lower Leg. The wound measures 0.5cm length x 0.7cm width x 0.2cm depth; 0.275cm^2 area and 0.055cm^3 volume. There is Fat Layer (Subcutaneous Tissue) exposed. There is no tunneling or undermining noted. There is a medium amount of serous drainage noted. The wound margin is flat and intact. There is no granulation within the wound bed. There is a large (67-100%) amount of necrotic tissue within the wound bed including Adherent Slough. Assessment Active Problems ICD-10 Chronic venous hypertension (idiopathic)  with ulcer and inflammation of left lower extremity Non-pressure chronic ulcer of other part of left lower leg with other specified severity Other idiopathic peripheral autonomic neuropathy SHONETTE, RHAMES. ([id number]) Procedures Wound #1 Pre-procedure diagnosis of Wound #1 is a Venous Leg Ulcer located on the Left,Midline Lower Leg .Severity of Tissue Pre Debridement is: Fat layer exposed. There was a Excisional Skin/Subcutaneous Tissue Debridement with a total area of 0.35 sq cm performed by Ricard Dillon, MD. With the following instrument(s): Curette to remove Viable and Non-Viable tissue/material. Material removed includes Subcutaneous Tissue, Slough, Skin: Dermis, and Skin: Epidermis after achieving pain control using Lidocaine 4% Topical Solution. No specimens were taken. A time out was conducted at 09:25, prior to the start of the procedure. A Minimum amount of bleeding was controlled with Pressure. The procedure was tolerated well with a pain level of 0 throughout and a pain level of  0 following the procedure. Post Debridement Measurements: 0.5cm length x 0.7cm width x 0.2cm depth; 0.055cm^3 volume. Character of Wound/Ulcer Post Debridement is improved. Severity of Tissue Post Debridement is: Fat layer exposed. Post procedure Diagnosis Wound #1: Same as Pre-Procedure Plan Follow-up Appointments: Return Appointment in 1 week. Bathing/ Shower/ Hygiene: May shower with wound dressing protected with water repellent cover or cast protector. Edema Control - Lymphedema / Segmental Compressive Device / Other: Optional: One layer of unna paste to top of compression wrap (to act as an anchor). Elevate, Exercise Daily and Avoid Standing for Long Periods of Time. Elevate legs to the level of the heart and pump ankles as often as possible Elevate leg(s) parallel to the floor when sitting. WOUND #1: - Lower Leg Wound Laterality: Left, Midline Cleanser: Soap and Water 1 x Per Week/30  Days Discharge Instructions: Gently cleanse wound with antibacterial soap, rinse and pat dry prior to dressing wounds Primary Dressing: IODOFLEX 0.9% Cadexomer Iodine Pad 1 x Per Week/30 Days Discharge Instructions: Apply Iodoflex to wound bed only as directed. Secondary Dressing: ABD Pad 5x9 (in/in) 1 x Per Week/30 Days Discharge Instructions: Cover with ABD pad Compression Wrap: Profore Lite LF 3 Multilayer Compression Bandaging System 1 x Per Week/30 Days Discharge Instructions: Apply 3 multi-layer wrap as prescribed. 1. Common things being common this is presumably ulcer secondary to chronic venous insufficiency with uncontrolled edema in the left leg. The wound was debrided. We will use Iodoflex under 3 layer compression although truthfully I think she can handle 4-layer from a purely vascular point of view 2. I have asked her to leave this on all week and to not get it wet. Advised about the possibility of obtaining a cast protector 3. I saw no evidence of infection currently I note that she has had at least 2 rounds of doxycycline and I do not think that is necessary today. 4. Dysesthetic pain in her left leg I think this is probably because of the neuropathy and perhaps somewhat because of the swelling. Be interesting to see if controlling the swelling will help with her discomfort. 5. Very anxious woman I almost thought that some of her involuntary movements might be abnormal although these seem to just anxiety related after had time to watch her for a while. 6. Very unsteady on her feet. She apparently uses a walker or cane at home. I would hope that that would be mostly the former. I spent 35 minutes in review of this patients PMH face-to-face evaluation and preparation of this record. I will be interested in seeing whether the edema control makes this lady's leg feel better Electronic Signature(s) Signed: 07/21/2020 4:35:25 PM By: Linton Ham MD Entered By: Linton Ham on  07/21/2020 10:02:28 [name] ([id number]) -------------------------------------------------------------------------------- ROS/PFSH Details Patient Name: [name] Date of Service: 07/21/2020 8:30 AM Medical Record Number: [id number] Patient Account Number: [id number] Date of Birth/Sex: February 18, 1933 (87 y.o. F) Treating RN: Cornell Barman Primary Care Provider: Tracie Harrier Other Clinician: Referring Provider: Brendolyn Patty Treating Provider/Extender: Tito Dine in Treatment: 0 Information Obtained From Patient Constitutional Symptoms ([place]) Complaints and Symptoms: Negative for: Chills Eyes Complaints and Symptoms: Positive for: Glasses / Contacts Ear/Nose/Mouth/Throat Complaints and Symptoms: Negative for: Difficult clearing ears; Sinusitis Hematologic/Lymphatic Complaints and Symptoms: Negative for: Bleeding / Clotting Disorders; Human Immunodeficiency Virus Medical History: Positive for: Lymphedema Cardiovascular Complaints and Symptoms: Positive for: LE edema Medical History: Positive for: Congestive Heart Failure - stent; Coronary Artery Disease Negative for: Deep  Vein Thrombosis; Hypertension; Hypotension; Myocardial Infarction; Peripheral Arterial Disease; Peripheral Venous Disease Gastrointestinal Complaints and Symptoms: Negative for: Frequent diarrhea; Nausea; Vomiting Endocrine Complaints and Symptoms: Positive for: Thyroid disease - removed 2021 Negative for: Hepatitis; Polydypsia (Excessive Thirst) Medical History: Negative for: Type I Diabetes; Type II Diabetes Immunological Complaints and Symptoms: Negative for: Hives; Itching Integumentary (Skin) Complaints and Symptoms: Positive for: Wounds Negative for: Bleeding or bruising tendency; Breakdown; Swelling Review of System Notes: KEEANNA, VILLAFRANCA ([id number]) Skin cancer removed, one on each leg. Psychiatric Complaints and Symptoms: Positive for: Anxiety;  Claustrophobia Respiratory Medical History: Positive for: Chronic Obstructive Pulmonary Disease (COPD) Genitourinary Medical History: Positive for: End Stage Renal Disease - Stage III-not treated Musculoskeletal Medical History: Positive for: Gout - feet; Osteoarthritis Neurologic Medical History: Positive for: Neuropathy - feet Oncologic Medical History: Positive for: Received Chemotherapy Past Medical History Notes: Lymphoma 2009 Immunizations Pneumococcal Vaccine: Received Pneumococcal Vaccination: Yes Implantable Devices No devices added Hospitalization / Surgery History Type of Hospitalization/Surgery April-dehydration Family and Social History Diabetes: Yes - Siblings; Never smoker; Marital Status - Widowed; Alcohol Use: Never; Drug Use: No History; Caffeine Use: Rarely Electronic Signature(s) Signed: 07/21/2020 3:58:10 PM By: Gretta Cool, BSN, RN, CWS, Kim RN, BSN Signed: 07/21/2020 4:35:25 PM By: Linton Ham MD Entered By: Gretta Cool, BSN, RN, CWS, Kim on 07/21/2020 09:14:17 Stansell, [name] ([id number]) -------------------------------------------------------------------------------- SuperBill Details Patient Name: LORETTO, [name]. Date of Service: 07/21/2020 Medical Record Number: [id number] Patient Account Number: [id number] Date of Birth/Sex: 1932/02/27 (87 y.o. F) Treating RN: Carlene Coria Primary Care Provider: Tracie Harrier Other Clinician: Referring Provider: Brendolyn Patty Treating Provider/Extender: Tito Dine in Treatment: 0 Diagnosis Coding ICD-10 Codes Code Description [phone] Chronic venous hypertension (idiopathic) with ulcer and inflammation of left lower extremity L97.828 Non-pressure chronic ulcer of other part of left lower leg with other specified severity G90.09 Other idiopathic peripheral autonomic neuropathy Facility Procedures CPT4 Code: [id number] Description: 15945 - WOUND CARE VISIT-LEV 4 EST PT Modifier: Quantity: 1 CPT4 Code:  [id number] Description: 62863 - DEB SUBQ TISSUE 20 SQ CM/< Modifier: Quantity: 1 CPT4 Code: Description: ICD-10 Diagnosis Description L97.828 Non-pressure chronic ulcer of other part of left lower leg with other speci Modifier: fied severity Quantity: Physician Procedures CPT4 Code Description: [id number] WC PHYS LEVEL 3 o NEW PT Modifier: 25 Quantity: 1 CPT4 Code Description: ICD-10 Diagnosis Description I87.332 Chronic venous hypertension (idiopathic) with ulcer and inflammation of lef L97.828 Non-pressure chronic ulcer of other part of left lower leg with other speci G90.09 Other idiopathic peripheral  autonomic neuropathy Modifier: t lower extremity fied severity Quantity: CPT4 Code Description: [id number] 33832 - WC PHYS SUBQ TISS 20 SQ CM Modifier: Quantity: 1 CPT4 Code Description: ICD-10 Diagnosis Description L97.828 Non-pressure chronic ulcer of other part of left lower leg with other speci Modifier: fied severity Quantity: Electronic Signature(s) Signed: 07/21/2020 4:35:25 PM By: Linton Ham MD Entered By: Linton Ham on 07/21/2020 10:03:01

## 2020-07-25 NOTE — Progress Notes (Signed)
KOURTNEY, TERRIQUEZ ([id number]) Visit Report for 07/21/2020 Allergy List Details Patient Name: LILIANAH, BUFFIN. Date of Service: 07/21/2020 8:30 AM Medical Record Number: [id number] Patient Account Number: [id number] Date of Birth/Sex: 01/27/1933 (87 y.o. F) Treating RN: Cornell Barman Primary Care Sibel Khurana: Tracie Harrier Other Clinician: Referring Shadonna Benedick: Brendolyn Patty Treating Avon Mergenthaler/Extender: Tito Dine in Treatment: 0 Allergies Active Allergies Sulfa (Sulfonamide Antibiotics) cefdinir cefuroxime Allergy Notes Electronic Signature(s) Signed: 07/21/2020 3:58:10 PM By: Gretta Cool, BSN, RN, CWS, Kim RN, BSN Entered By: Gretta Cool, BSN, RN, CWS, Kim on 07/21/2020 08:58:47 [name] ([id number]) -------------------------------------------------------------------------------- Arrival Information Details Patient Name: [name] Date of Service: 07/21/2020 8:30 AM Medical Record Number: [id number] Patient Account Number: [id number] Date of Birth/Sex: December 17, 1932 (87 y.o. F) Treating RN: Carlene Coria Primary Care Mickell Birdwell: Tracie Harrier Other Clinician: Referring Artie Mcintyre: Brendolyn Patty Treating Raizy Auzenne/Extender: Tito Dine in Treatment: 0 Visit Information Patient Arrived: Ambulatory Arrival Time: 08:43 Accompanied By: daughter Transfer Assistance: None Patient Identification Verified: Yes Secondary Verification Process Completed: Yes Patient Requires Transmission-Based Precautions: No Patient Has Alerts: Yes Patient Alerts: NOT DIABETIC Electronic Signature(s) Signed: 07/21/2020 3:58:10 PM By: Gretta Cool, BSN, RN, CWS, Kim RN, BSN Entered By: Gretta Cool, BSN, RN, CWS, Kim on 07/21/2020 08:54:34 Monie, [name] ([id number]) -------------------------------------------------------------------------------- Clinic Level of Care Assessment Details Patient Name: YUVAL, NOLET. Date of Service: 07/21/2020 8:30 AM Medical Record Number: [id number] Patient Account  Number: [id number] Date of Birth/Sex: April 01, 1932 (87 y.o. F) Treating RN: Carlene Coria Primary Care Roxie Kreeger: Tracie Harrier Other Clinician: Referring Jahna Liebert: Brendolyn Patty Treating Ohanna Gassert/Extender: Tito Dine in Treatment: 0 Clinic Level of Care Assessment Items TOOL 2 Quantity Score X - Use when only an EandM is performed on the INITIAL visit 1 0 ASSESSMENTS - Nursing Assessment / Reassessment X - General Physical Exam (combine w/ comprehensive assessment (listed just below) when performed on new 1 20 pt. evals) X- 1 25 Comprehensive Assessment (HX, ROS, Risk Assessments, Wounds Hx, etc.) ASSESSMENTS - Wound and Skin Assessment / Reassessment X - Simple Wound Assessment / Reassessment - one wound 1 5 []  - 0 Complex Wound Assessment / Reassessment - multiple wounds []  - 0 Dermatologic / Skin Assessment (not related to wound area) ASSESSMENTS - Ostomy and/or Continence Assessment and Care []  - Incontinence Assessment and Management 0 []  - 0 Ostomy Care Assessment and Management (repouching, etc.) PROCESS - Coordination of Care X - Simple Patient / Family Education for ongoing care 1 15 []  - 0 Complex (extensive) Patient / Family Education for ongoing care X- 1 10 Staff obtains Programmer, systems, Records, Test Results / Process Orders []  - 0 Staff telephones HHA, Nursing Homes / Clarify orders / etc []  - 0 Routine Transfer to another Facility (non-emergent condition) []  - 0 Routine Hospital Admission (non-emergent condition) []  - 0 New Admissions / Biomedical engineer / Ordering NPWT, Apligraf, etc. []  - 0 Emergency Hospital Admission (emergent condition) X- 1 10 Simple Discharge Coordination []  - 0 Complex (extensive) Discharge Coordination PROCESS - Special Needs []  - Pediatric / Minor Patient Management 0 []  - 0 Isolation Patient Management []  - 0 Hearing / Language / Visual special needs []  - 0 Assessment of Community assistance (transportation,  D/C planning, etc.) []  - 0 Additional assistance / Altered mentation []  - 0 Support Surface(s) Assessment (bed, cushion, seat, etc.) INTERVENTIONS - Wound Cleansing / Measurement X - Wound Imaging (photographs - any number of wounds) 1 5 []  - 0 Wound Tracing (instead of photographs) X- 1 5 Simple  Wound Measurement - one wound []  - 0 Complex Wound Measurement - multiple wounds HOLLIN, CREWE. ([id number]) X- 1 5 Simple Wound Cleansing - one wound []  - 0 Complex Wound Cleansing - multiple wounds INTERVENTIONS - Wound Dressings X - Small Wound Dressing one or multiple wounds 1 10 []  - 0 Medium Wound Dressing one or multiple wounds []  - 0 Large Wound Dressing one or multiple wounds []  - 0 Application of Medications - injection INTERVENTIONS - Miscellaneous []  - External ear exam 0 []  - 0 Specimen Collection (cultures, biopsies, blood, body fluids, etc.) []  - 0 Specimen(s) / Culture(s) sent or taken to Lab for analysis []  - 0 Patient Transfer (multiple staff / Civil Service fast streamer / Similar devices) []  - 0 Simple Staple / Suture removal (25 or less) []  - 0 Complex Staple / Suture removal (26 or more) []  - 0 Hypo / Hyperglycemic Management (close monitor of Blood Glucose) X- 1 15 Ankle / Brachial Index (ABI) - do not check if billed separately Has the patient been seen at the hospital within the last three years: Yes Total Score: 125 Level Of Care: New/Established - Level 4 Electronic Signature(s) Signed: 07/25/2020 3:50:09 PM By: Carlene Coria RN Entered By: Carlene Coria on 07/21/2020 09:29:55 Deeley, [name] ([id number]) -------------------------------------------------------------------------------- Encounter Discharge Information Details Patient Name: [name]. Date of Service: 07/21/2020 8:30 AM Medical Record Number: [id number] Patient Account Number: [id number] Date of Birth/Sex: 11-Jul-1932 (87 y.o. F) Treating RN: Dolan Amen Primary Care Lindell Renfrew: Tracie Harrier Other Clinician: Referring Sivan Quast: Brendolyn Patty Treating Zadiel Leyh/Extender: Tito Dine in Treatment: 0 Encounter Discharge Information Items Post Procedure Vitals Discharge Condition: Stable Temperature (F): 98.3 Ambulatory Status: Ambulatory Pulse (bpm): 98 Discharge Destination: Home Respiratory Rate (breaths/min): 18 Transportation: Private Auto Blood Pressure (mmHg): 144/82 Accompanied By: daughter Schedule Follow-up Appointment: Yes Clinical Summary of Care: Electronic Signature(s) Signed: 07/21/2020 9:58:37 AM By: Georges Mouse, Minus Breeding RN Entered By: Georges Mouse, Minus Breeding on 07/21/2020 09:58:37 Kost, [name] ([id number]) -------------------------------------------------------------------------------- Lower Extremity Assessment Details Patient Name: [name]. Date of Service: 07/21/2020 8:30 AM Medical Record Number: [id number] Patient Account Number: [id number] Date of Birth/Sex: 1933/02/13 (87 y.o. F) Treating RN: Cornell Barman Primary Care Kween Bacorn: Tracie Harrier Other Clinician: Referring Sulamita Lafountain: Brendolyn Patty Treating Kirubel Aja/Extender: Tito Dine in Treatment: 0 Edema Assessment Assessed: [Left: No] [Right: No] [Left: Edema] [Right: :] Calf Left: Right: Point of Measurement: 30 cm From Medial Instep 36.7 cm 35 cm Ankle Left: Right: Point of Measurement: 9 cm From Medial Instep 23 cm 21.5 cm Knee To Floor Left: Right: From Medial Instep 38 cm 38 cm Vascular Assessment Pulses: Dorsalis Pedis Palpable: [Left:Yes] [Right:Yes] Doppler Audible: [Left:Yes] [Right:Yes] Posterior Tibial Palpable: [Left:Yes] [Right:Yes] Doppler Audible: [Left:Yes] [Right:Yes] Blood Pressure: Brachial: [Left:136] Dorsalis Pedis: 178 Ankle: Posterior Tibial: 170 Ankle Brachial Index: [Left:1.31] Electronic Signature(s) Signed: 07/21/2020 3:58:10 PM By: Gretta Cool, BSN, RN, CWS, Kim RN, BSN Entered By: Gretta Cool, BSN, RN, CWS, Kim on  07/21/2020 09:11:40 Holsworth, [name] ([id number]) -------------------------------------------------------------------------------- Multi Wound Chart Details Patient Name: [name] Date of Service: 07/21/2020 8:30 AM Medical Record Number: [id number] Patient Account Number: [id number] Date of Birth/Sex: 1932/03/29 (87 y.o. F) Treating RN: Carlene Coria Primary Care Joy Haegele: Tracie Harrier Other Clinician: Referring Karuna Balducci: Brendolyn Patty Treating Yatzary Merriweather/Extender: Tito Dine in Treatment: 0 Vital Signs Height(in): 60 Pulse(bpm): 98 Weight(lbs): 155 Blood Pressure(mmHg): 144/82 Body Mass Index(BMI): 30 Temperature(F): 98.3 Respiratory Rate(breaths/min): 18 Photos: [N/A:N/A] Wound Location: Left, Midline Lower Leg N/A N/A  Wounding Event: Gradually Appeared N/A N/A Primary Etiology: Venous Leg Ulcer N/A N/A Comorbid History: Lymphedema, Chronic Obstructive N/A N/A Pulmonary Disease (COPD), Congestive Heart Failure, Coronary Artery Disease, End Stage Renal Disease, Gout, Osteoarthritis, Neuropathy, Received Chemotherapy Date Acquired: 06/21/2020 N/A N/A Weeks of Treatment: 0 N/A N/A Wound Status: Open N/A N/A Measurements L x W x D (cm) 0.5x0.7x0.2 N/A N/A Area (cm) : 0.275 N/A N/A Volume (cm) : 0.055 N/A N/A % Reduction in Area: 0.00% N/A N/A % Reduction in Volume: 0.00% N/A N/A Classification: Full Thickness Without Exposed N/A N/A Support Structures Exudate Amount: Medium N/A N/A Exudate Type: Serous N/A N/A Exudate Color: amber N/A N/A Wound Margin: Flat and Intact N/A N/A Granulation Amount: None Present (0%) N/A N/A Necrotic Amount: Large (67-100%) N/A N/A Exposed Structures: Fat Layer (Subcutaneous Tissue): N/A N/A Yes Fascia: No Tendon: No Muscle: No Joint: No Bone: No Epithelialization: None N/A N/A Debridement: Debridement - Excisional N/A N/A Pre-procedure Verification/Time 09:25 N/A N/A Out Taken: Pain Control: Lidocaine 4%  Topical Solution N/A N/A Tissue Debrided: Subcutaneous, Slough N/A N/A Level: Skin/Subcutaneous Tissue N/A N/A Debridement Area (sq cm): 0.35 N/A N/A Instrument: Curette N/A N/A Bleeding: Minimum N/A N/A YESLI, VANDERHOFF ([id number]) Hemostasis Achieved: Pressure N/A N/A Procedural Pain: 0 N/A N/A Post Procedural Pain: 0 N/A N/A Debridement Treatment Procedure was tolerated well N/A N/A Response: Post Debridement 0.5x0.7x0.2 N/A N/A Measurements L x W x D (cm) Post Debridement Volume: 0.055 N/A N/A (cm) Procedures Performed: Debridement N/A N/A Treatment Notes Electronic Signature(s) Signed: 07/21/2020 4:35:25 PM By: Linton Ham MD Entered By: Linton Ham on 07/21/2020 09:50:09 Dingledine, [name] ([id number]) -------------------------------------------------------------------------------- [place] Plan Details Patient Name: MARGUERITE, BARBA. Date of Service: 07/21/2020 8:30 AM Medical Record Number: [id number] Patient Account Number: [id number] Date of Birth/Sex: 1932-11-05 (87 y.o. F) Treating RN: Carlene Coria Primary Care Sulema Braid: Tracie Harrier Other Clinician: Referring Melquiades Kovar: Brendolyn Patty Treating Shirleen Mcfaul/Extender: Tito Dine in Treatment: 0 Active Inactive Wound/Skin Impairment Nursing Diagnoses: Knowledge deficit related to ulceration/compromised skin integrity Goals: Patient/caregiver will verbalize understanding of skin care regimen Date Initiated: 07/21/2020 Target Resolution Date: 08/21/2020 Goal Status: Active Ulcer/skin breakdown will have a volume reduction of 30% by week 4 Date Initiated: 07/21/2020 Target Resolution Date: 08/21/2020 Goal Status: Active Ulcer/skin breakdown will have a volume reduction of 50% by week 8 Date Initiated: 07/21/2020 Target Resolution Date: 09/20/2020 Goal Status: Active Ulcer/skin breakdown will have a volume reduction of 80% by week 12 Date Initiated: 07/21/2020 Target Resolution Date:  10/21/2020 Goal Status: Active Ulcer/skin breakdown will heal within 14 weeks Date Initiated: 07/21/2020 Target Resolution Date: 11/21/2020 Goal Status: Active Interventions: Assess patient/caregiver ability to obtain necessary supplies Assess patient/caregiver ability to perform ulcer/skin care regimen upon admission and as needed Assess ulceration(s) every visit Notes: Electronic Signature(s) Signed: 07/25/2020 3:50:09 PM By: Carlene Coria RN Entered By: Carlene Coria on 07/21/2020 09:22:55 Conrey, [name] ([id number]) -------------------------------------------------------------------------------- Pain Assessment Details Patient Name: [name] Date of Service: 07/21/2020 8:30 AM Medical Record Number: [id number] Patient Account Number: [id number] Date of Birth/Sex: 1932-10-28 (87 y.o. F) Treating RN: Cornell Barman Primary Care Kellin Fifer: Tracie Harrier Other Clinician: Referring Cecil Bixby: Brendolyn Patty Treating Ruchama Kubicek/Extender: Tito Dine in Treatment: 0 Active Problems Location of Pain Severity and Description of Pain Patient Has Paino Yes Site Locations Pain Location: Pain in Ulcers Rate the pain. Current Pain Level: 7 Character of Pain Describe the Pain: Aching, Burning, Throbbing Pain Management and Medication Current Pain Management: Electronic Signature(s) Signed:  07/21/2020 3:58:10 PM By: Gretta Cool, BSN, RN, CWS, Kim RN, BSN Entered By: Gretta Cool, BSN, RN, CWS, Kim on 07/21/2020 08:51:56 Schult, [name] ([id number]) -------------------------------------------------------------------------------- Patient/Caregiver Education Details Patient Name: SHONICA, WEIER. Date of Service: 07/21/2020 8:30 AM Medical Record Number: [id number] Patient Account Number: [id number] Date of Birth/Gender: [birth year] (87 y.o. F) Treating RN: Carlene Coria Primary Care Physician: Tracie Harrier Other Clinician: Referring Physician: Brendolyn Patty Treating Physician/Extender:  Tito Dine in Treatment: 0 Education Assessment Education Provided To: Patient Education Topics Provided Wound/Skin Impairment: Methods: Explain/Verbal Responses: State content correctly Electronic Signature(s) Signed: 07/25/2020 3:50:09 PM By: Carlene Coria RN Entered By: Carlene Coria on 07/21/2020 09:30:22 Zaccaro, [name] ([id number]) -------------------------------------------------------------------------------- Wound Assessment Details Patient Name: [name] Date of Service: 07/21/2020 8:30 AM Medical Record Number: [id number] Patient Account Number: [id number] Date of Birth/Sex: 07/18/1932 (87 y.o. F) Treating RN: Cornell Barman Primary Care Detrick Dani: Tracie Harrier Other Clinician: Referring Cortlynn Hollinsworth: Brendolyn Patty Treating Vince Ainsley/Extender: Tito Dine in Treatment: 0 Wound Status Wound Number: 1 Primary Venous Leg Ulcer Etiology: Wound Location: Left, Midline Lower Leg Wound Open Wounding Event: Gradually Appeared Status: Date Acquired: 06/21/2020 Comorbid Lymphedema, Chronic Obstructive Pulmonary Disease Weeks Of Treatment: 0 History: (COPD), Congestive Heart Failure, Coronary Artery Disease, Clustered Wound: No End Stage Renal Disease, Gout, Osteoarthritis, Neuropathy, Received Chemotherapy Photos Wound Measurements Length: (cm) 0.5 Width: (cm) 0.7 Depth: (cm) 0.2 Area: (cm) 0.275 Volume: (cm) 0.055 % Reduction in Area: 0% % Reduction in Volume: 0% Epithelialization: None Tunneling: No Undermining: No Wound Description Classification: Full Thickness Without Exposed Support Structu Wound Margin: Flat and Intact Exudate Amount: Medium Exudate Type: Serous Exudate Color: amber res Foul Odor After Cleansing: No Slough/Fibrino Yes Wound Bed Granulation Amount: None Present (0%) Exposed Structure Necrotic Amount: Large (67-100%) Fascia Exposed: No Necrotic Quality: Adherent Slough Fat Layer (Subcutaneous Tissue) Exposed:  Yes Tendon Exposed: No Muscle Exposed: No Joint Exposed: No Bone Exposed: No Treatment Notes Wound #1 (Lower Leg) Wound Laterality: Left, Midline Cleanser Soap and Water Discharge Instruction: Gently cleanse wound with antibacterial soap, rinse and pat dry prior to dressing wounds [place], [place]. ([id number]) Peri-Wound Care Topical Primary Dressing IODOFLEX 0.9% Cadexomer Iodine Pad Discharge Instruction: Apply Iodoflex to wound bed only as directed. Secondary Dressing ABD Pad 5x9 (in/in) Discharge Instruction: Cover with ABD pad Secured With Compression Wrap Profore Lite LF 3 Multilayer Compression Bandaging System Discharge Instruction: Apply 3 multi-layer wrap as prescribed. Compression Stockings Environmental education officer) Signed: 07/21/2020 9:24:58 AM By: Gretta Cool, BSN, RN, CWS, Kim RN, BSN Entered By: Gretta Cool, BSN, RN, CWS, Kim on 07/21/2020 09:24:57 [name] ([id number]) -------------------------------------------------------------------------------- Vitals Details Patient Name: DESTANI, WAMSER. Date of Service: 07/21/2020 8:30 AM Medical Record Number: [id number] Patient Account Number: [id number] Date of Birth/Sex: 07-21-1932 (87 y.o. F) Treating RN: Cornell Barman Primary Care Artha Stavros: Tracie Harrier Other Clinician: Referring Maycee Blasco: Brendolyn Patty Treating Kadijah Shamoon/Extender: Tito Dine in Treatment: 0 Vital Signs Time Taken: 08:52 Temperature (F): 98.3 Height (in): 60 Pulse (bpm): 98 Weight (lbs): 155 Respiratory Rate (breaths/min): 18 Body Mass Index (BMI): 30.3 Blood Pressure (mmHg): 144/82 Reference Range: 80 - 120 mg / dl Electronic Signature(s) Signed: 07/21/2020 3:58:10 PM By: Gretta Cool, BSN, RN, CWS, Kim RN, BSN Entered By: Gretta Cool, BSN, RN, CWS, Kim on 07/21/2020 08:52:35

## 2020-07-28 ENCOUNTER — Other Ambulatory Visit: Payer: Self-pay

## 2020-07-28 ENCOUNTER — Encounter: Payer: Medicare Other | Attending: Physician Assistant | Admitting: Physician Assistant

## 2020-07-28 DIAGNOSIS — I87332 Chronic venous hypertension (idiopathic) with ulcer and inflammation of left lower extremity: Secondary | ICD-10-CM | POA: Insufficient documentation

## 2020-07-28 DIAGNOSIS — Z85828 Personal history of other malignant neoplasm of skin: Secondary | ICD-10-CM | POA: Diagnosis not present

## 2020-07-28 DIAGNOSIS — Z9221 Personal history of antineoplastic chemotherapy: Secondary | ICD-10-CM | POA: Diagnosis not present

## 2020-07-28 DIAGNOSIS — I480 Paroxysmal atrial fibrillation: Secondary | ICD-10-CM | POA: Insufficient documentation

## 2020-07-28 DIAGNOSIS — G9009 Other idiopathic peripheral autonomic neuropathy: Secondary | ICD-10-CM | POA: Diagnosis not present

## 2020-07-28 DIAGNOSIS — L97828 Non-pressure chronic ulcer of other part of left lower leg with other specified severity: Secondary | ICD-10-CM | POA: Diagnosis present

## 2020-07-28 DIAGNOSIS — Z8572 Personal history of non-Hodgkin lymphomas: Secondary | ICD-10-CM | POA: Insufficient documentation

## 2020-07-28 DIAGNOSIS — I509 Heart failure, unspecified: Secondary | ICD-10-CM | POA: Diagnosis not present

## 2020-07-28 DIAGNOSIS — I2510 Atherosclerotic heart disease of native coronary artery without angina pectoris: Secondary | ICD-10-CM | POA: Insufficient documentation

## 2020-07-28 DIAGNOSIS — Z955 Presence of coronary angioplasty implant and graft: Secondary | ICD-10-CM | POA: Diagnosis not present

## 2020-07-28 NOTE — Progress Notes (Addendum)
[name], [name] ([id number]) Visit Report for 07/28/2020 Chief Complaint Document Details Patient Name: [name]. Date of Service: 07/28/2020 1:15 PM Medical Record Number: [id number] Patient Account Number: [id number] Date of Birth/Sex: August 18, 1932 (87 y.o. F) Treating RN: Carlene Coria Primary Care Provider: Tracie Harrier Other Clinician: Referring Provider: Tracie Harrier Treating Provider/Extender: Skipper Cliche in Treatment: 1 Information Obtained from: Patient Chief Complaint 07/21/2020; patient is here for review of a small wound on the left anterior mid tibia. Electronic Signature(s) Signed: 07/28/2020 1:35:12 PM By: Worthy Keeler PA-C Entered By: Worthy Keeler on 07/28/2020 13:35:12 [name] ([id number]) -------------------------------------------------------------------------------- Debridement Details Patient Name: [name] Date of Service: 07/28/2020 1:15 PM Medical Record Number: [id number] Patient Account Number: [id number] Date of Birth/Sex: 04/14/1932 (87 y.o. F) Treating RN: Carlene Coria Primary Care Provider: Tracie Harrier Other Clinician: Referring Provider: Tracie Harrier Treating Provider/Extender: Skipper Cliche in Treatment: 1 Debridement Performed for Wound #1 Left,Midline Lower Leg Assessment: Performed By: Physician Tommie Sams., PA-C Debridement Type: Debridement Severity of Tissue Pre Debridement: Fat layer exposed Level of Consciousness (Pre- Awake and Alert procedure): Pre-procedure Verification/Time Out Yes - 13:42 Taken: Start Time: 13:42 Pain Control: Lidocaine 4% Topical Solution Total Area Debrided (L x W): 0.5 (cm) x 0.6 (cm) = 0.3 (cm) Tissue and other material Viable, Non-Viable, Slough, Subcutaneous, Skin: Dermis , Skin: Epidermis, Slough debrided: Level: Skin/Subcutaneous Tissue Debridement Description: Excisional Instrument: Curette Bleeding: Moderate Hemostasis Achieved: Pressure End  Time: 13:45 Procedural Pain: 3 Post Procedural Pain: 0 Response to Treatment: Procedure was tolerated well Level of Consciousness (Post- Awake and Alert procedure): Post Debridement Measurements of Total Wound Length: (cm) 0.5 Width: (cm) 0.6 Depth: (cm) 0.1 Volume: (cm) 0.024 Character of Wound/Ulcer Post Debridement: Improved Severity of Tissue Post Debridement: Fat layer exposed Post Procedure Diagnosis Same as Pre-procedure Electronic Signature(s) Signed: 07/28/2020 5:30:36 PM By: Worthy Keeler PA-C Signed: 08/01/2020 8:01:20 AM By: Carlene Coria RN Entered By: Carlene Coria on 07/28/2020 13:45:44 [name] ([id number]) -------------------------------------------------------------------------------- HPI Details Patient Name: [name] Date of Service: 07/28/2020 1:15 PM Medical Record Number: [id number] Patient Account Number: [id number] Date of Birth/Sex: [birth year] (87 y.o. F) Treating RN: Carlene Coria Primary Care Provider: Tracie Harrier Other Clinician: Referring Provider: Tracie Harrier Treating Provider/Extender: Skipper Cliche in Treatment: 1 History of Present Illness HPI Description: ADMISSION 07/21/2020 This is an 87 year old woman who arrived in clinic accompanied by her daughter. Referred by Dr. Brendolyn Patty her dermatologist. She is felt to have venous insufficiency causing the swelling and skin breakdown. I note that she is also had a skin cancer recently removed just above where the wound is but Dr. Nicole Kindred did not feel that needed to be biopsied per the patient. She is also had a history of a skin cancer on the right posterior calf at some time in the past. Dr. Nicole Kindred placed an Louretta Parma boot on her the last time she was seen on 07/18/2020 she took this off at home because it was too tight and uncomfortable the wound itself has been there for about a month. She came in with a Band-Aid over this and multiple Band-Aids on her leg so that this is the  only open area I could see. She complains of a lot of pain in both legs that does not seem particularly activity related. She says she has a neuropathy related to a past history of chemotherapy for her lymphoma Past medical history includes neuropathy, gout, chronic kidney disease stage III,  lymphedema, coronary artery disease with a stent, stasis dermatitis, thyroidectomy in 2021, low back pain, bilateral lower extremity edema. Paroxysmal atrial fibrillation and squamous cell CA. She was recently in hospital for congestive heart failure and delirium ABI in our clinic was 1.31 on the left 07/28/2020 upon evaluation today patient's wound actually showing signs of improvement. This is a little bit smaller although it is something that I believe is going to require little bit of sharp debridement today. Fortunately there does not appear to be any evidence of active infection which is great and overall I am extremely pleased with where things stand today. Electronic Signature(s) Signed: 07/28/2020 2:16:01 PM By: Worthy Keeler PA-C Entered By: Worthy Keeler on 07/28/2020 14:16:01 [name] ([id number]) -------------------------------------------------------------------------------- Physical Exam Details Patient Name: [name]. Date of Service: 07/28/2020 1:15 PM Medical Record Number: [id number] Patient Account Number: [id number] Date of Birth/Sex: 12-25-1932 (87 y.o. F) Treating RN: Carlene Coria Primary Care Provider: Tracie Harrier Other Clinician: Referring Provider: Tracie Harrier Treating Provider/Extender: Skipper Cliche in Treatment: 1 Constitutional Well-nourished and well-hydrated in no acute distress. Respiratory normal breathing without difficulty. Psychiatric this patient is able to make decisions and demonstrates good insight into disease process. Alert and Oriented x 3. pleasant and cooperative. Notes Patient's wound bed showed signs of good granulation  epithelization at this point. I do not see any evidence whatsoever of active infection which is great news. In general I think that the patient is making progress I did perform sharp debridement to clear away some of the remaining eschar and what appears to be Iodoflex which had dried into the wound bed. She tolerated that without complication this was down to good subcutaneous tissue removing slough along the way as well. Post debridement the wound bed appears to be doing much better. Electronic Signature(s) Signed: 07/28/2020 2:16:31 PM By: Worthy Keeler PA-C Entered By: Worthy Keeler on 07/28/2020 14:16:31 Pattillo, Littie Erickson ([id number]) -------------------------------------------------------------------------------- Physician Orders Details Patient Name: [name] Date of Service: 07/28/2020 1:15 PM Medical Record Number: [id number] Patient Account Number: [id number] Date of Birth/Sex: Mar 29, 1932 (87 y.o. F) Treating RN: Carlene Coria Primary Care Provider: Tracie Harrier Other Clinician: Referring Provider: Tracie Harrier Treating Provider/Extender: Skipper Cliche in Treatment: 1 Verbal / Phone Orders: No Diagnosis Coding ICD-10 Coding Code Description [phone] Chronic venous hypertension (idiopathic) with ulcer and inflammation of left lower extremity L97.828 Non-pressure chronic ulcer of other part of left lower leg with other specified severity G90.09 Other idiopathic peripheral autonomic neuropathy Follow-up Appointments o Return Appointment in 1 week. Bathing/ Shower/ Hygiene o May shower with wound dressing protected with water repellent cover or cast protector. Edema Control - Lymphedema / Segmental Compressive Device / Other o Optional: One layer of unna paste to top of compression wrap (to act as an anchor). o Elevate, Exercise Daily and Avoid Standing for Long Periods of Time. o Elevate legs to the level of the heart and pump ankles as often as  possible o Elevate leg(s) parallel to the floor when sitting. Wound Treatment Wound #1 - Lower Leg Wound Laterality: Left, Midline Cleanser: Soap and Water 1 x Per Week/30 Days Discharge Instructions: Gently cleanse wound with antibacterial soap, rinse and pat dry prior to dressing wounds Primary Dressing: Prisma 4.34 (in) 1 x Per Week/30 Days Discharge Instructions: Moisten w/normal saline or sterile water; Cover wound as directed. Do not remove from wound bed. Secondary Dressing: ABD Pad 5x9 (in/in) 1 x Per Week/30 Days Discharge Instructions:  Cover with ABD pad Compression Wrap: Profore Lite LF 3 Multilayer Compression Bandaging System 1 x Per Week/30 Days Discharge Instructions: Apply 3 multi-layer wrap as prescribed. Electronic Signature(s) Signed: 07/28/2020 5:30:36 PM By: Worthy Keeler PA-C Signed: 08/01/2020 8:01:20 AM By: Carlene Coria RN Entered By: Carlene Coria on 07/28/2020 13:46:17 Mccaffery, Littie Erickson ([id number]) -------------------------------------------------------------------------------- Problem List Details Patient Name: JAIMI, BELLE. Date of Service: 07/28/2020 1:15 PM Medical Record Number: [id number] Patient Account Number: [id number] Date of Birth/Sex: 1933/01/10 (87 y.o. F) Treating RN: Carlene Coria Primary Care Provider: Tracie Harrier Other Clinician: Referring Provider: Tracie Harrier Treating Provider/Extender: Skipper Cliche in Treatment: 1 Active Problems ICD-10 Encounter Code Description Active Date MDM Diagnosis I87.332 Chronic venous hypertension (idiopathic) with ulcer and inflammation of 07/21/2020 No Yes left lower extremity L97.828 Non-pressure chronic ulcer of other part of left lower leg with other 07/21/2020 No Yes specified severity G90.09 Other idiopathic peripheral autonomic neuropathy 07/21/2020 No Yes Inactive Problems Resolved Problems Electronic Signature(s) Signed: 07/28/2020 1:35:07 PM By: Worthy Keeler PA-C Entered By: Worthy Keeler on 07/28/2020 13:35:07 Ellner, Littie Erickson ([id number]) -------------------------------------------------------------------------------- Progress Note Details Patient Name: [name] Date of Service: 07/28/2020 1:15 PM Medical Record Number: [id number] Patient Account Number: [id number] Date of Birth/Sex: 10/21/1932 (87 y.o. F) Treating RN: Carlene Coria Primary Care Provider: Tracie Harrier Other Clinician: Referring Provider: Tracie Harrier Treating Provider/Extender: Skipper Cliche in Treatment: 1 Subjective Chief Complaint Information obtained from Patient 07/21/2020; patient is here for review of a small wound on the left anterior mid tibia. History of Present Illness (HPI) ADMISSION 07/21/2020 This is an 87 year old woman who arrived in clinic accompanied by her daughter. Referred by Dr. Brendolyn Patty her dermatologist. She is felt to have venous insufficiency causing the swelling and skin breakdown. I note that she is also had a skin cancer recently removed just above where the wound is but Dr. Nicole Kindred did not feel that needed to be biopsied per the patient. She is also had a history of a skin cancer on the right posterior calf at some time in the past. Dr. Nicole Kindred placed an Louretta Parma boot on her the last time she was seen on 07/18/2020 she took this off at home because it was too tight and uncomfortable the wound itself has been there for about a month. She came in with a Band-Aid over this and multiple Band-Aids on her leg so that this is the only open area I could see. She complains of a lot of pain in both legs that does not seem particularly activity related. She says she has a neuropathy related to a past history of chemotherapy for her lymphoma Past medical history includes neuropathy, gout, chronic kidney disease stage III, lymphedema, coronary artery disease with a stent, stasis dermatitis, thyroidectomy in 2021, low back pain, bilateral lower extremity edema.  Paroxysmal atrial fibrillation and squamous cell CA. She was recently in hospital for congestive heart failure and delirium ABI in our clinic was 1.31 on the left 07/28/2020 upon evaluation today patient's wound actually showing signs of improvement. This is a little bit smaller although it is something that I believe is going to require little bit of sharp debridement today. Fortunately there does not appear to be any evidence of active infection which is great and overall I am extremely pleased with where things stand today. Objective Constitutional Well-nourished and well-hydrated in no acute distress. Vitals Time Taken: 1:18 PM, Height: 60 in, Weight: 155 lbs, BMI: 30.3, Temperature: 98.2 F,  Pulse: 93 bpm, Respiratory Rate: 20 breaths/min, Blood Pressure: 148/68 mmHg. Respiratory normal breathing without difficulty. Psychiatric this patient is able to make decisions and demonstrates good insight into disease process. Alert and Oriented x 3. pleasant and cooperative. General Notes: Patient's wound bed showed signs of good granulation epithelization at this point. I do not see any evidence whatsoever of active infection which is great news. In general I think that the patient is making progress I did perform sharp debridement to clear away some of the remaining eschar and what appears to be Iodoflex which had dried into the wound bed. She tolerated that without complication this was down to good subcutaneous tissue removing slough along the way as well. Post debridement the wound bed appears to be doing much better. Integumentary (Hair, Skin) Wound #1 status is Open. Original cause of wound was Gradually Appeared. The date acquired was: 06/21/2020. The wound has been in treatment 1 weeks. The wound is located on the Left,Midline Lower Leg. The wound measures 0.5cm length x 0.6cm width x 0.1cm depth; 0.236cm^2 area and 0.024cm^3 volume. There is Fat Layer (Subcutaneous Tissue) exposed. There is  no tunneling or undermining noted. There is a none present amount of drainage noted. The wound margin is flat and intact. There is no granulation within the wound bed. There is a large (67-100%) amount of necrotic tissue within the wound bed including Eschar. 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 There was a Excisional Skin/Subcutaneous Tissue Debridement with a total area of 0.3 sq cm performed by Tommie Sams., PA-C. With the following instrument(s): Curette to remove Viable and Non-Viable tissue/material. Material removed includes Subcutaneous Tissue, Slough, Skin: Dermis, and Skin: Epidermis after achieving pain control using Lidocaine 4% Topical Solution. No specimens were taken. A time out was conducted at 13:42, prior to the start of the procedure. A Moderate amount of bleeding was controlled with Pressure. The procedure was tolerated well with a pain level of 3 throughout and a pain level of 0 following the procedure. Post Debridement Measurements: 0.5cm length x 0.6cm width x 0.1cm depth; 0.024cm^3 volume. Character of Wound/Ulcer Post Debridement is improved. Severity of Tissue Post Debridement is: Fat layer exposed. Post procedure Diagnosis Wound #1: Same as Pre-Procedure Plan Follow-up Appointments: Return Appointment in 1 week. Bathing/ Shower/ Hygiene: May shower with wound dressing protected with water repellent cover or cast protector. Edema Control - Lymphedema / Segmental Compressive Device / Other: Optional: One layer of unna paste to top of  compression wrap (to act as an anchor). Elevate, Exercise Daily and Avoid Standing for Long Periods of Time. Elevate legs to the level of the heart and pump ankles as often as possible Elevate leg(s) parallel to the floor when sitting. WOUND #1: - Lower Leg Wound Laterality: Left, Midline Cleanser: Soap and Water 1 x Per Week/30 Days Discharge Instructions: Gently cleanse wound with antibacterial soap, rinse and pat dry prior to dressing wounds Primary Dressing: Prisma 4.34 (in) 1 x Per Week/30 Days Discharge Instructions: Moisten w/normal saline or sterile water; Cover wound as directed. Do not remove from wound bed. Secondary Dressing: ABD Pad 5x9 (in/in) 1 x Per  Week/30 Days Discharge Instructions: Cover with ABD pad Compression Wrap: Profore Lite LF 3 Multilayer Compression Bandaging System 1 x Per Week/30 Days Discharge Instructions: Apply 3 multi-layer wrap as prescribed. 1. Would recommend currently that we going to continue with the wound care measures as before and the patient is in agreement with plan this includes the use of the silver collagen dressing which I think is actually good to do better than the Iodoflex now that the wound is clean. 2. We will also continue with an ABD pad to cover followed by 3 layer compression wrap. 3. I did recommend the patient contact her primary care provider to see about getting in sooner to discuss her fluid pill and see if there is anything they can do to get her back on this. Obviously a take her off work as she was in the hospital due to low potassium. I completely understand being leery about this may be there is another option. We will see patient back for reevaluation in 1 week here in the clinic. If anything worsens or changes patient will contact our office for additional recommendations. Electronic Signature(s) Signed: 07/28/2020 2:17:14 PM By: Worthy Keeler PA-C Entered By: Worthy Keeler on 07/28/2020 14:17:14 [place], Littie Erickson  ([id number]) [address] Littie Erickson ([id number]) -------------------------------------------------------------------------------- SuperBill Details Patient Name: [name] Date of Service: 07/28/2020 Medical Record Number: [id number] Patient Account Number: [id number] Date of Birth/Sex: 27-Nov-1932 (87 y.o. F) Treating RN: Carlene Coria Primary Care Provider: Tracie Harrier Other Clinician: Referring Provider: Tracie Harrier Treating Provider/Extender: Skipper Cliche in Treatment: 1 Diagnosis Coding ICD-10 Codes Code Description [phone] Chronic venous hypertension (idiopathic) with ulcer and inflammation of left lower extremity L97.828 Non-pressure chronic ulcer of other part of left lower leg with other specified severity G90.09 Other idiopathic peripheral autonomic neuropathy Facility Procedures CPT4 Code Description: [id number] - DEB SUBQ TISSUE 20 SQ CM/< Modifier: Quantity: 1 CPT4 Code Description: ICD-10 Diagnosis Description I87.332 Chronic venous hypertension (idiopathic) with ulcer and inflammation of le Modifier: ft lower extremity Quantity: Physician Procedures CPT4 Code Description: [id number] - WC PHYS SUBQ TISS 20 SQ CM Modifier: Quantity: 1 CPT4 Code Description: ICD-10 Diagnosis Description I87.332 Chronic venous hypertension (idiopathic) with ulcer and inflammation of le Modifier: ft lower extremity Quantity: Electronic Signature(s) Signed: 07/28/2020 2:17:23 PM By: Worthy Keeler PA-C Entered By: Worthy Keeler on 07/28/2020 14:17:22

## 2020-08-01 NOTE — Progress Notes (Signed)
[name], [name] ([id number]) Visit Report for 07/28/2020 Arrival Information Details Patient Name: [name], [name]. Date of Service: 07/28/2020 1:15 PM Medical Record Number: [id number] Patient Account Number: [id number] Date of Birth/Sex: [birth year] (87 y.o. F) Treating RN: Donnamarie Poag Primary Care Bhavika Schnider: Tracie Harrier Other Clinician: Referring Raivyn Kabler: Tracie Harrier Treating Zaina Jenkin/Extender: Skipper Cliche in Treatment: 1 Visit Information History Since Last Visit Added or deleted any medications: No Patient Arrived: [name] Had a fall or experienced change in No Arrival Time: 13:15 activities of daily living that may affect Accompanied By: daughter risk of falls: Transfer Assistance: None Hospitalized since last visit: No Patient Identification Verified: Yes Has Dressing in Place as Prescribed: Yes Secondary Verification Process Completed: Yes Has Compression in Place as Prescribed: Yes Patient Requires Transmission-Based Precautions: No Pain Present Now: Yes Patient Has Alerts: Yes Patient Alerts: NOT DIABETIC Electronic Signature(s) Signed: 07/28/2020 3:38:14 PM By: Donnamarie Poag Entered By: Donnamarie Poag on 07/28/2020 13:19:48 Keator, [name] ([id number]) -------------------------------------------------------------------------------- Clinic Level of Care Assessment Details Patient Name: [name] Date of Service: 07/28/2020 1:15 PM Medical Record Number: [id number] Patient Account Number: [id number] Date of Birth/Sex: Oct 07, 1932 (87 y.o. F) Treating RN: Carlene Coria Primary Care Alma Mohiuddin: Tracie Harrier Other Clinician: Referring Dicie Edelen: Tracie Harrier Treating Tresten Pantoja/Extender: Skipper Cliche in Treatment: 1 Clinic Level of Care Assessment Items TOOL 1 Quantity Score []  - Use when EandM and Procedure is performed on INITIAL visit 0 ASSESSMENTS - Nursing Assessment / Reassessment []  - General Physical Exam (combine w/ comprehensive assessment  (listed just below) when performed on new 0 pt. evals) []  - 0 Comprehensive Assessment (HX, ROS, Risk Assessments, Wounds Hx, etc.) ASSESSMENTS - Wound and Skin Assessment / Reassessment []  - Dermatologic / Skin Assessment (not related to wound area) 0 ASSESSMENTS - Ostomy and/or Continence Assessment and Care []  - Incontinence Assessment and Management 0 []  - 0 Ostomy Care Assessment and Management (repouching, etc.) PROCESS - Coordination of Care []  - Simple Patient / Family Education for ongoing care 0 []  - 0 Complex (extensive) Patient / Family Education for ongoing care []  - 0 Staff obtains Programmer, systems, Records, Test Results / Process Orders []  - 0 Staff telephones HHA, Nursing Homes / Clarify orders / etc []  - 0 Routine Transfer to another Facility (non-emergent condition) []  - 0 Routine Hospital Admission (non-emergent condition) []  - 0 New Admissions / Biomedical engineer / Ordering NPWT, Apligraf, etc. []  - 0 Emergency Hospital Admission (emergent condition) PROCESS - Special Needs []  - Pediatric / Minor Patient Management 0 []  - 0 Isolation Patient Management []  - 0 Hearing / Language / Visual special needs []  - 0 Assessment of Community assistance (transportation, D/C planning, etc.) []  - 0 Additional assistance / Altered mentation []  - 0 Support Surface(s) Assessment (bed, cushion, seat, etc.) INTERVENTIONS - Miscellaneous []  - External ear exam 0 []  - 0 Patient Transfer (multiple staff / Civil Service fast streamer / Similar devices) []  - 0 Simple Staple / Suture removal (25 or less) []  - 0 Complex Staple / Suture removal (26 or more) []  - 0 Hypo/Hyperglycemic Management (do not check if billed separately) []  - 0 Ankle / Brachial Index (ABI) - do not check if billed separately Has the patient been seen at the hospital within the last three years: Yes Total Score: 0 Level Of Care: ____ [name] ([id number]) Electronic Signature(s) Signed: 08/01/2020 8:01:20  AM By: Carlene Coria RN Entered By: Carlene Coria on 07/28/2020 13:46:33 Nolting, [name] ([id number]) -------------------------------------------------------------------------------- Encounter Discharge Information Details  Patient Name: [name], [name]. Date of Service: 07/28/2020 1:15 PM Medical Record Number: [id number] Patient Account Number: [id number] Date of Birth/Sex: [birth year] (87 y.o. F) Treating RN: Donnamarie Poag Primary Care Angelee Bahr: Tracie Harrier Other Clinician: Referring Penne Rosenstock: Tracie Harrier Treating Meldon Hanzlik/Extender: Skipper Cliche in Treatment: 1 Encounter Discharge Information Items Post Procedure Vitals Discharge Condition: Stable Temperature (F): 97.8 Ambulatory Status: Cane Pulse (bpm): 93 Discharge Destination: Home Respiratory Rate (breaths/min): 20 Transportation: Private Auto Blood Pressure (mmHg): 148/68 Accompanied By: daughter Schedule Follow-up Appointment: Yes Clinical Summary of Care: Electronic Signature(s) Signed: 07/28/2020 3:38:14 PM By: Donnamarie Poag Entered By: Donnamarie Poag on 07/28/2020 14:05:25 Hernandez, [name] ([id number]) -------------------------------------------------------------------------------- Lower Extremity Assessment Details Patient Name: [name]. Date of Service: 07/28/2020 1:15 PM Medical Record Number: [id number] Patient Account Number: [id number] Date of Birth/Sex: [birth year] (87 y.o. F) Treating RN: Donnamarie Poag Primary Care Lennie Vasco: Tracie Harrier Other Clinician: Referring Adrianah Prophete: Tracie Harrier Treating Jeilyn Reznik/Extender: Skipper Cliche in Treatment: 1 Edema Assessment Assessed: [Left: No] [Right: No] [Left: Edema] [Right: :] Calf Left: Right: Point of Measurement: 30 cm From Medial Instep 39.4 cm Ankle Left: Right: Point of Measurement: 9 cm From Medial Instep 22 cm Knee To Floor Left: Right: From Medial Instep 38 cm Vascular Assessment Pulses: Dorsalis Pedis Palpable:  [Left:Yes] Electronic Signature(s) Signed: 07/28/2020 3:38:14 PM By: Donnamarie Poag Entered By: Donnamarie Poag on 07/28/2020 13:30:05 Cronce, [name] ([id number]) -------------------------------------------------------------------------------- Multi Wound Chart Details Patient Name: [name]. Date of Service: 07/28/2020 1:15 PM Medical Record Number: [id number] Patient Account Number: [id number] Date of Birth/Sex: [birth year] (87 y.o. F) Treating RN: Carlene Coria Primary Care Isao Seltzer: Tracie Harrier Other Clinician: Referring Essance Gatti: Tracie Harrier Treating Jakel Alphin/Extender: Skipper Cliche in Treatment: 1 Vital Signs Height(in): 60 Pulse(bpm): 93 Weight(lbs): 155 Blood Pressure(mmHg): 148/68 Body Mass Index(BMI): 30 Temperature(F): 98.2 Respiratory Rate(breaths/min): 20 Photos: [N/A:N/A] Wound Location: Left, Midline Lower Leg N/A N/A Wounding Event: Gradually Appeared N/A N/A Primary Etiology: Venous Leg Ulcer N/A N/A Comorbid History: Lymphedema, Chronic Obstructive N/A N/A Pulmonary Disease (COPD), Congestive Heart Failure, Coronary Artery Disease, End Stage Renal Disease, Gout, Osteoarthritis, Neuropathy, Received Chemotherapy Date Acquired: 06/21/2020 N/A N/A Weeks of Treatment: 1 N/A N/A Wound Status: Open N/A N/A Measurements L x W x D (cm) 0.5x0.6x0.1 N/A N/A Area (cm) : 0.236 N/A N/A Volume (cm) : 0.024 N/A N/A % Reduction in Area: 14.20% N/A N/A % Reduction in Volume: 56.40% N/A N/A Classification: Full Thickness Without Exposed N/A N/A Support Structures Exudate Amount: None Present N/A N/A Wound Margin: Flat and Intact N/A N/A Granulation Amount: None Present (0%) N/A N/A Necrotic Amount: Large (67-100%) N/A N/A Necrotic Tissue: Eschar N/A N/A Exposed Structures: Fat Layer (Subcutaneous Tissue): N/A N/A Yes Fascia: No Tendon: No Muscle: No Joint: No Bone: No Epithelialization: None N/A N/A Treatment Notes Electronic  Signature(s) Signed: 08/01/2020 8:01:20 AM By: Carlene Coria RN Entered By: Carlene Coria on 07/28/2020 13:43:33 Copes, [name] ([id number]) [name], [name] ([id number]) -------------------------------------------------------------------------------- Multi-Disciplinary Care Plan Details Patient Name: [name] Date of Service: 07/28/2020 1:15 PM Medical Record Number: [id number] Patient Account Number: [id number] Date of Birth/Sex: Jul 25, 1932 (87 y.o. F) Treating RN: Carlene Coria Primary Care Yuvin Bussiere: Tracie Harrier Other Clinician: Referring Takiera Mayo: Tracie Harrier Treating Djon Tith/Extender: Skipper Cliche in Treatment: 1 Active Inactive Wound/Skin Impairment Nursing Diagnoses: Knowledge deficit related to ulceration/compromised skin integrity Goals: Patient/caregiver will verbalize understanding of skin care regimen Date Initiated: 07/21/2020 Target Resolution Date: 08/21/2020 Goal Status: Active Ulcer/skin breakdown will have a volume reduction  of 30% by week 4 Date Initiated: 07/21/2020 Target Resolution Date: 08/21/2020 Goal Status: Active Ulcer/skin breakdown will have a volume reduction of 50% by week 8 Date Initiated: 07/21/2020 Target Resolution Date: 09/20/2020 Goal Status: Active Ulcer/skin breakdown will have a volume reduction of 80% by week 12 Date Initiated: 07/21/2020 Target Resolution Date: 10/21/2020 Goal Status: Active Ulcer/skin breakdown will heal within 14 weeks Date Initiated: 07/21/2020 Target Resolution Date: 11/21/2020 Goal Status: Active Interventions: Assess patient/caregiver ability to obtain necessary supplies Assess patient/caregiver ability to perform ulcer/skin care regimen upon admission and as needed Assess ulceration(s) every visit Notes: Electronic Signature(s) Signed: 08/01/2020 8:01:20 AM By: Carlene Coria RN Entered By: Carlene Coria on 07/28/2020 13:43:21 Dearden, [name]  ([id number]) -------------------------------------------------------------------------------- Pain Assessment Details Patient Name: [name] Date of Service: 07/28/2020 1:15 PM Medical Record Number: [id number] Patient Account Number: [id number] Date of Birth/Sex: Dec 22, 1932 (87 y.o. F) Treating RN: Donnamarie Poag Primary Care Bartosz Luginbill: Tracie Harrier Other Clinician: Referring Jaszmine Navejas: Tracie Harrier Treating Karisha Marlin/Extender: Skipper Cliche in Treatment: 1 Active Problems Location of Pain Severity and Description of Pain Patient Has Paino Yes Site Locations Pain Location: Generalized Pain, Pain in Ulcers Rate the pain. Current Pain Level: 7 Pain Management and Medication Current Pain Management: Electronic Signature(s) Signed: 07/28/2020 3:38:14 PM By: Donnamarie Poag Entered By: Donnamarie Poag on 07/28/2020 13:22:14 Maggi, [name] ([id number]) -------------------------------------------------------------------------------- Patient/Caregiver Education Details Patient Name: [name] Date of Service: 07/28/2020 1:15 PM Medical Record Number: [id number] Patient Account Number: [id number] Date of Birth/Gender: 1932-08-26 (87 y.o. F) Treating RN: Carlene Coria Primary Care Physician: Tracie Harrier Other Clinician: Referring Physician: Tracie Harrier Treating Physician/Extender: Skipper Cliche in Treatment: 1 Education Assessment Education Provided To: Patient Education Topics Provided Wound/Skin Impairment: Methods: Explain/Verbal Responses: State content correctly Electronic Signature(s) Signed: 08/01/2020 8:01:20 AM By: Carlene Coria RN Entered By: Carlene Coria on 07/28/2020 13:46:48 Bouldin, [name] ([id number]) -------------------------------------------------------------------------------- Wound Assessment Details Patient Name: [name] Date of Service: 07/28/2020 1:15 PM Medical Record Number: [id number] Patient Account Number: [id number] Date  of Birth/Sex: Jun 11, 1932 (87 y.o. F) Treating RN: Donnamarie Poag Primary Care Kourtnie Sachs: Tracie Harrier Other Clinician: Referring Devera Englander: Tracie Harrier Treating Ione Sandusky/Extender: Skipper Cliche in Treatment: 1 Wound Status Wound Number: 1 Primary Venous Leg Ulcer Etiology: Wound Location: Left, Midline Lower Leg Wound Open Wounding Event: Gradually Appeared Status: Date Acquired: 06/21/2020 Comorbid Lymphedema, Chronic Obstructive Pulmonary Disease Weeks Of Treatment: 1 History: (COPD), Congestive Heart Failure, Coronary Artery Disease, Clustered Wound: No End Stage Renal Disease, Gout, Osteoarthritis, Neuropathy, Received Chemotherapy Photos Wound Measurements Length: (cm) 0.5 Width: (cm) 0.6 Depth: (cm) 0.1 Area: (cm) 0.236 Volume: (cm) 0.024 % Reduction in Area: 14.2% % Reduction in Volume: 56.4% Epithelialization: None Tunneling: No Undermining: No Wound Description Classification: Full Thickness Without Exposed Support Structures Wound Margin: Flat and Intact Exudate Amount: None Present Foul Odor After Cleansing: No Slough/Fibrino No Wound Bed Granulation Amount: None Present (0%) Exposed Structure Necrotic Amount: Large (67-100%) Fascia Exposed: No Necrotic Quality: Eschar Fat Layer (Subcutaneous Tissue) Exposed: Yes Tendon Exposed: No Muscle Exposed: No Joint Exposed: No Bone Exposed: No Treatment Notes Wound #1 (Lower Leg) Wound Laterality: Left, Midline Cleanser Soap and Water Discharge Instruction: Gently cleanse wound with antibacterial soap, rinse and pat dry prior to dressing wounds Peri-Wound Care SHEWANDA, SHARPE ([id number]) Topical Primary Dressing Prisma 4.34 (in) Discharge Instruction: Moisten w/normal saline or sterile water; Cover wound as directed. Do not remove from wound bed. Secondary Dressing ABD Pad 5x9 (in/in) Discharge Instruction: Cover  with ABD pad Secured With Compression Wrap Profore Lite LF 3 Multilayer  Compression Bandaging System Discharge Instruction: Apply 3 multi-layer wrap as prescribed. Compression Stockings Add-Ons Electronic Signature(s) Signed: 07/28/2020 3:38:14 PM By: Donnamarie Poag Entered By: Donnamarie Poag on 07/28/2020 13:27:21 Heaton, [name] ([id number]) -------------------------------------------------------------------------------- Vitals Details Patient Name: [name]. Date of Service: 07/28/2020 1:15 PM Medical Record Number: [id number] Patient Account Number: [id number] Date of Birth/Sex: [birth year] (87 y.o. F) Treating RN: Donnamarie Poag Primary Care Trelon Plush: Tracie Harrier Other Clinician: Referring Daphnee Preiss: Tracie Harrier Treating [place] Luppino/Extender: Skipper Cliche in Treatment: 1 Vital Signs Time Taken: 13:18 Temperature (F): 98.2 Height (in): 60 Pulse (bpm): 93 Weight (lbs): 155 Respiratory Rate (breaths/min): 20 Body Mass Index (BMI): 30.3 Blood Pressure (mmHg): 148/68 Reference Range: 80 - 120 mg / dl Electronic Signature(s) Signed: 07/28/2020 3:38:14 PM By: Donnamarie Poag Entered ByDonnamarie Poag on 07/28/2020 13:22:04

## 2020-08-04 ENCOUNTER — Other Ambulatory Visit: Payer: Self-pay

## 2020-08-04 ENCOUNTER — Encounter: Payer: Medicare Other | Admitting: Physician Assistant

## 2020-08-04 DIAGNOSIS — I87332 Chronic venous hypertension (idiopathic) with ulcer and inflammation of left lower extremity: Secondary | ICD-10-CM | POA: Diagnosis not present

## 2020-08-04 NOTE — Progress Notes (Addendum)
**Note Erickson-Identified via Obfuscation** [name], [name] ([id number]) Visit Report for 08/04/2020 Chief Complaint Document Details Patient Name: [name], [name]. Date of Service: 08/04/2020 3:00 PM Medical Record Number: [id number] Patient Account Number: [id number] Date of Birth/Sex: Jan 13, 1933 (87 y.o. F) Treating RN: Carlene Coria Primary Care Provider: Tracie Harrier Other Clinician: Referring Provider: Tracie Harrier Treating Provider/Extender: Skipper Cliche in Treatment: 2 Information Obtained from: Patient Chief Complaint 07/21/2020; patient is here for review of a small wound on the left anterior mid tibia. Electronic Signature(s) Signed: 08/04/2020 3:40:46 PM By: Worthy Keeler PA-C Entered By: Worthy Keeler on 08/04/2020 15:40:45 [place], [name] ([id number]) -------------------------------------------------------------------------------- HPI Details Patient Name: [name] Date of Service: 08/04/2020 3:00 PM Medical Record Number: [id number] Patient Account Number: [id number] Date of Birth/Sex: 02-Dec-1932 (87 y.o. F) Treating RN: Carlene Coria Primary Care Provider: Tracie Harrier Other Clinician: Referring Provider: Tracie Harrier Treating Provider/Extender: Skipper Cliche in Treatment: 2 History of Present Illness HPI Description: ADMISSION 07/21/2020 This is an 87 year old woman who arrived in clinic accompanied by her daughter. Referred by Dr. Brendolyn Patty her dermatologist. She is felt to have venous insufficiency causing the swelling and skin breakdown. I note that she is also had a skin cancer recently removed just above where the wound is but Dr. Nicole Kindred did not feel that needed to be biopsied per the patient. She is also had a history of a skin cancer on the right posterior calf at some time in the past. Dr. Nicole Kindred placed an Louretta Parma boot on her the last time she was seen on 07/18/2020 she took this off at home because it was too tight and uncomfortable the wound itself has been there for  about a month. She came in with a Band-Aid over this and multiple Band-Aids on her leg so that this is the only open area I could see. She complains of a lot of pain in both legs that does not seem particularly activity related. She says she has a neuropathy related to a past history of chemotherapy for her lymphoma Past medical history includes neuropathy, gout, chronic kidney disease stage III, lymphedema, coronary artery disease with a stent, stasis dermatitis, thyroidectomy in 2021, low back pain, bilateral lower extremity edema. Paroxysmal atrial fibrillation and squamous cell CA. She was recently in hospital for congestive heart failure and delirium ABI in our clinic was 1.31 on the left 07/28/2020 upon evaluation today patient's wound actually showing signs of improvement. This is a little bit smaller although it is something that I believe is going to require little bit of sharp debridement today. Fortunately there does not appear to be any evidence of active infection which is great and overall I am extremely pleased with where things stand today. 08/04/2020 upon evaluation today patient appears to be doing well with regard to her wound. She has been tolerating the dressing changes without complication. Fortunately there does not appear to be any sign of active infection at this time. No fevers, chills, nausea, vomiting, or diarrhea. Electronic Signature(s) Signed: 08/04/2020 5:30:12 PM By: Worthy Keeler PA-C Entered By: Worthy Keeler on 08/04/2020 17:30:12 [name], [name] ([id number]) -------------------------------------------------------------------------------- Physical Exam Details Patient Name: [name], [name]. Date of Service: 08/04/2020 3:00 PM Medical Record Number: [id number] Patient Account Number: [id number] Date of Birth/Sex: 03-Nov-1932 (86 y.o. F) Treating RN: Carlene Coria Primary Care Provider: Tracie Harrier Other Clinician: Referring Provider: Tracie Harrier Treating Provider/Extender: Skipper Cliche in Treatment: 2 Constitutional Well-nourished and well-hydrated in no acute distress. Respiratory normal  breathing without difficulty. Psychiatric this patient is able to make decisions and demonstrates good insight into disease process. Alert and Oriented x 3. pleasant and cooperative. Notes Upon inspection patient's wound bed showed signs of good granulation epithelization at this point. There does not appear to be any signs of active infection which is great and overall I am extremely pleased with where things stand at this point. No fevers, chills, nausea, vomiting, or diarrhea. Electronic Signature(s) Signed: 08/04/2020 5:30:29 PM By: Worthy Keeler PA-C Entered By: Worthy Keeler on 08/04/2020 17:30:29 [name], [name] ([id number]) -------------------------------------------------------------------------------- Physician Orders Details Patient Name: [name] Date of Service: 08/04/2020 3:00 PM Medical Record Number: [id number] Patient Account Number: [id number] Date of Birth/Sex: 09-28-1932 (87 y.o. F) Treating RN: Dolan Amen Primary Care Provider: Tracie Harrier Other Clinician: Referring Provider: Tracie Harrier Treating Provider/Extender: Skipper Cliche in Treatment: 2 Verbal / Phone Orders: No Diagnosis Coding ICD-10 Coding Code Description [phone] Chronic venous hypertension (idiopathic) with ulcer and inflammation of left lower extremity L97.828 Non-pressure chronic ulcer of other part of left lower leg with other specified severity G90.09 Other idiopathic peripheral autonomic neuropathy Follow-up Appointments o Return Appointment in 1 week. Bathing/ Shower/ Hygiene o May shower with wound dressing protected with water repellent cover or cast protector. Edema Control - Lymphedema / Segmental Compressive Device / Other o Optional: One layer of unna paste to top of compression wrap (to  act as an anchor). o Elevate, Exercise Daily and Avoid Standing for Long Periods of Time. o Elevate legs to the level of the heart and pump ankles as often as possible o Elevate leg(s) parallel to the floor when sitting. Wound Treatment Wound #1 - Lower Leg Wound Laterality: Left, Midline Cleanser: Soap and Water 1 x Per Week/30 Days Discharge Instructions: Gently cleanse wound with antibacterial soap, rinse and pat dry prior to dressing wounds Peri-Wound Care: Triamcinolone Acetonide Cream, 0.1%, 15 (g) tube 1 x Per Week/30 Days Primary Dressing: Prisma 4.34 (in) 1 x Per Week/30 Days Discharge Instructions: Moisten w/normal saline or sterile water; Cover wound as directed. Do not remove from wound bed. Secondary Dressing: ABD Pad 5x9 (in/in) 1 x Per Week/30 Days Discharge Instructions: Cover with ABD pad Compression Wrap: Profore Lite LF 3 Multilayer Compression Bandaging System 1 x Per Week/30 Days Discharge Instructions: Apply 3 multi-layer wrap as prescribed. Electronic Signature(s) Signed: 08/04/2020 4:59:55 PM By: Georges Mouse, Minus Breeding RN Signed: 08/04/2020 5:52:53 PM By: Worthy Keeler PA-C Entered By: Georges Mouse, Minus Breeding on 08/04/2020 16:13:09 [name], [name] ([id number]) -------------------------------------------------------------------------------- Problem List Details Patient Name: [name], [name]. Date of Service: 08/04/2020 3:00 PM Medical Record Number: [id number] Patient Account Number: [id number] Date of Birth/Sex: 1932-08-01 (87 y.o. F) Treating RN: Carlene Coria Primary Care Provider: Tracie Harrier Other Clinician: Referring Provider: Tracie Harrier Treating Provider/Extender: Skipper Cliche in Treatment: 2 Active Problems ICD-10 Encounter Code Description Active Date MDM Diagnosis I87.332 Chronic venous hypertension (idiopathic) with ulcer and inflammation of 07/21/2020 No Yes left lower extremity L97.828 Non-pressure chronic ulcer of other part  of left lower leg with other 07/21/2020 No Yes specified severity G90.09 Other idiopathic peripheral autonomic neuropathy 07/21/2020 No Yes Inactive Problems Resolved Problems Electronic Signature(s) Signed: 08/04/2020 3:40:39 PM By: Worthy Keeler PA-C Entered By: Worthy Keeler on 08/04/2020 15:40:39 [name], [name] ([id number]) -------------------------------------------------------------------------------- Progress Note Details Patient Name: [name] Date of Service: 08/04/2020 3:00 PM Medical Record Number: [id number] Patient Account Number: [id number] Date of Birth/Sex: September 21, 1932 (87 y.o. F)  Treating RN: Carlene Coria Primary Care Provider: Tracie Harrier Other Clinician: Referring Provider: Tracie Harrier Treating Provider/Extender: Skipper Cliche in Treatment: 2 Subjective Chief Complaint Information obtained from Patient 07/21/2020; patient is here for review of a small wound on the left anterior mid tibia. History of Present Illness (HPI) ADMISSION 07/21/2020 This is an 87 year old woman who arrived in clinic accompanied by her daughter. Referred by Dr. Brendolyn Patty her dermatologist. She is felt to have venous insufficiency causing the swelling and skin breakdown. I note that she is also had a skin cancer recently removed just above where the wound is but Dr. Nicole Kindred did not feel that needed to be biopsied per the patient. She is also had a history of a skin cancer on the right posterior calf at some time in the past. Dr. Nicole Kindred placed an Louretta Parma boot on her the last time she was seen on 07/18/2020 she took this off at home because it was too tight and uncomfortable the wound itself has been there for about a month. She came in with a Band-Aid over this and multiple Band-Aids on her leg so that this is the only open area I could see. She complains of a lot of pain in both legs that does not seem particularly activity related. She says she has a neuropathy related  to a past history of chemotherapy for her lymphoma Past medical history includes neuropathy, gout, chronic kidney disease stage III, lymphedema, coronary artery disease with a stent, stasis dermatitis, thyroidectomy in 2021, low back pain, bilateral lower extremity edema. Paroxysmal atrial fibrillation and squamous cell CA. She was recently in hospital for congestive heart failure and delirium ABI in our clinic was 1.31 on the left 07/28/2020 upon evaluation today patient's wound actually showing signs of improvement. This is a little bit smaller although it is something that I believe is going to require little bit of sharp debridement today. Fortunately there does not appear to be any evidence of active infection which is great and overall I am extremely pleased with where things stand today. 08/04/2020 upon evaluation today patient appears to be doing well with regard to her wound. She has been tolerating the dressing changes without complication. Fortunately there does not appear to be any sign of active infection at this time. No fevers, chills, nausea, vomiting, or diarrhea. Objective Constitutional Well-nourished and well-hydrated in no acute distress. Vitals Time Taken: 3:23 PM, Height: 60 in, Weight: 155 lbs, BMI: 30.3, Temperature: 98.1 F, Pulse: 69 bpm, Respiratory Rate: 20 breaths/min, Blood Pressure: 142/74 mmHg. Respiratory normal breathing without difficulty. Psychiatric this patient is able to make decisions and demonstrates good insight into disease process. Alert and Oriented x 3. pleasant and cooperative. General Notes: Upon inspection patient's wound bed showed signs of good granulation epithelization at this point. There does not appear to be any signs of active infection which is great and overall I am extremely pleased with where things stand at this point. No fevers, chills, nausea, vomiting, or diarrhea. Integumentary (Hair, Skin) Wound #1 status is Open. Original  cause of wound was Gradually Appeared. The date acquired was: 06/21/2020. The wound has been in treatment 2 weeks. The wound is located on the Left,Midline Lower Leg. The wound measures 0.5cm length x 0.3cm width x 0.2cm depth; 0.118cm^2 area and 0.024cm^3 volume. There is Fat Layer (Subcutaneous Tissue) exposed. There is no tunneling or undermining noted. There is a none present amount of drainage noted. The wound margin is flat and intact. There is  medium (34-66%) granulation within the wound bed. There is a medium [name], [name]. ([id number]) (34-66%) amount of necrotic tissue within the wound bed including Adherent Slough. 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 There was a Three Layer Compression Therapy Procedure with a pre-treatment ABI of 1.3 by Dolan Amen, RN. Post procedure Diagnosis Wound #1: Same as Pre-Procedure Plan Follow-up Appointments: Return Appointment in 1 week. Bathing/ Shower/ Hygiene: May shower with wound dressing protected with water repellent cover or cast protector. Edema Control - Lymphedema / Segmental Compressive Device / Other: Optional: One layer of unna paste to top of compression wrap (to act as an anchor). Elevate, Exercise Daily and Avoid Standing for Long Periods of Time. Elevate legs to the level of the heart and pump ankles as often as possible Elevate leg(s) parallel to the floor when sitting. WOUND #1: - Lower Leg Wound Laterality: Left, Midline Cleanser: Soap and Water 1 x Per Week/30 Days Discharge Instructions: Gently cleanse wound with antibacterial soap, rinse and pat dry prior to dressing  wounds Peri-Wound Care: Triamcinolone Acetonide Cream, 0.1%, 15 (g) tube 1 x Per Week/30 Days Primary Dressing: Prisma 4.34 (in) 1 x Per Week/30 Days Discharge Instructions: Moisten w/normal saline or sterile water; Cover wound as directed. Do not remove from wound bed. Secondary Dressing: ABD Pad 5x9 (in/in) 1 x Per Week/30 Days Discharge Instructions: Cover with ABD pad Compression Wrap: Profore Lite LF 3 Multilayer Compression Bandaging System 1 x Per Week/30 Days Discharge Instructions: Apply 3 multi-layer wrap as prescribed. 1. I am going to suggest that we continue with the triamcinolone to the leg followed by collagen to the wound itself I think this is doing a good job. 2. We will cover with an ABD pad followed by 3 layer compression wrap. We will see patient back for reevaluation in 1 week here in the clinic. If anything worsens or changes patient will contact our office for additional recommendations. Electronic Signature(s) Signed: 08/04/2020 5:31:03 PM By: Worthy Keeler PA-C Entered By: Worthy Keeler on 08/04/2020 17:31:02 [name], [name] ([id number]) -------------------------------------------------------------------------------- SuperBill Details Patient Name: [name] Date of Service: 08/04/2020 Medical Record Number: [id number] Patient Account Number: [id number] Date of Birth/Sex: Jan 12, 1933 (87 y.o. F) Treating RN: Dolan Amen Primary Care Provider: Tracie Harrier Other Clinician: Referring Provider: Tracie Harrier Treating Provider/Extender: Skipper Cliche in Treatment: 2 Diagnosis Coding ICD-10 Codes Code Description [phone] Chronic venous hypertension (idiopathic) with ulcer and inflammation of left lower extremity L97.828 Non-pressure chronic ulcer of other part of left lower leg with other specified severity G90.09 Other idiopathic peripheral autonomic neuropathy Facility Procedures CPT4 Code: [id number] Description: (Facility Use Only)  [phone] -  LWR LT LEG Modifier: Quantity: 1 Physician Procedures CPT4 Code Description: [id number] - WC PHYS LEVEL 3 - EST PT Modifier: Quantity: 1 CPT4 Code Description: ICD-10 Diagnosis Description I87.332 Chronic venous hypertension (idiopathic) with ulcer and inflammation of le L97.828 Non-pressure chronic ulcer of other part of left lower leg with other spec G90.09 Other idiopathic peripheral  autonomic neuropathy Modifier: ft lower extremity ified severity Quantity: Electronic Signature(s) Signed: 08/04/2020 5:31:16 PM By: Worthy Keeler PA-C Previous Signature: 08/04/2020 4:59:55 PM Version By: Georges Mouse, Minus Breeding RN Entered  By: Worthy Keeler on 08/04/2020 17:31:16

## 2020-08-04 NOTE — Progress Notes (Signed)
HANAKO, TIPPING ([id number]) Visit Report for 08/04/2020 Arrival Information Details Patient Name: [name], [name]. Date of Service: 08/04/2020 3:00 PM Medical Record Number: [id number] Patient Account Number: [id number] Date of Birth/Sex: 11/24/1932 (87 y.o. F) Treating RN: Donnamarie Poag Primary Care Asma Boldon: Tracie Harrier Other Clinician: Referring Ayelet Gruenewald: Tracie Harrier Treating Walaa Carel/Extender: Skipper Cliche in Treatment: 2 Visit Information History Since Last Visit Added or deleted any medications: No Patient Arrived: Ambulatory Had a fall or experienced change in No Arrival Time: 15:19 activities of daily living that may affect Accompanied By: daughter risk of falls: Transfer Assistance: None Hospitalized since last visit: No Patient Identification Verified: Yes Has Dressing in Place as Prescribed: Yes Secondary Verification Process Completed: Yes Pain Present Now: No Patient Requires Transmission-Based Precautions: No Patient Has Alerts: Yes Patient Alerts: NOT DIABETIC Electronic Signature(s) Signed: 08/04/2020 5:10:42 PM By: Donnamarie Poag Entered By: Donnamarie Poag on 08/04/2020 15:21:30 Kearse, [name] ([id number]) -------------------------------------------------------------------------------- Clinic Level of Care Assessment Details Patient Name: [name] Date of Service: 08/04/2020 3:00 PM Medical Record Number: [id number] Patient Account Number: [id number] Date of Birth/Sex: [birth year] (87 y.o. F) Treating RN: Dolan Amen Primary Care Jalyn Dutta: Tracie Harrier Other Clinician: Referring Tung Pustejovsky: Tracie Harrier Treating Tila Millirons/Extender: Skipper Cliche in Treatment: 2 Clinic Level of Care Assessment Items TOOL 1 Quantity Score []  - Use when EandM and Procedure is performed on INITIAL visit 0 ASSESSMENTS - Nursing Assessment / Reassessment []  - General Physical Exam (combine w/ comprehensive assessment (listed just below) when performed  on new 0 pt. evals) []  - 0 Comprehensive Assessment (HX, ROS, Risk Assessments, Wounds Hx, etc.) ASSESSMENTS - Wound and Skin Assessment / Reassessment []  - Dermatologic / Skin Assessment (not related to wound area) 0 ASSESSMENTS - Ostomy and/or Continence Assessment and Care []  - Incontinence Assessment and Management 0 []  - 0 Ostomy Care Assessment and Management (repouching, etc.) PROCESS - Coordination of Care []  - Simple Patient / Family Education for ongoing care 0 []  - 0 Complex (extensive) Patient / Family Education for ongoing care []  - 0 Staff obtains Programmer, systems, Records, Test Results / Process Orders []  - 0 Staff telephones HHA, Nursing Homes / Clarify orders / etc []  - 0 Routine Transfer to another Facility (non-emergent condition) []  - 0 Routine Hospital Admission (non-emergent condition) []  - 0 New Admissions / Biomedical engineer / Ordering NPWT, Apligraf, etc. []  - 0 Emergency Hospital Admission (emergent condition) PROCESS - Special Needs []  - Pediatric / Minor Patient Management 0 []  - 0 Isolation Patient Management []  - 0 Hearing / Language / Visual special needs []  - 0 Assessment of Community assistance (transportation, D/C planning, etc.) []  - 0 Additional assistance / Altered mentation []  - 0 Support Surface(s) Assessment (bed, cushion, seat, etc.) INTERVENTIONS - Miscellaneous []  - External ear exam 0 []  - 0 Patient Transfer (multiple staff / Civil Service fast streamer / Similar devices) []  - 0 Simple Staple / Suture removal (25 or less) []  - 0 Complex Staple / Suture removal (26 or more) []  - 0 Hypo/Hyperglycemic Management (do not check if billed separately) []  - 0 Ankle / Brachial Index (ABI) - do not check if billed separately Has the patient been seen at the hospital within the last three years: Yes Total Score: 0 Level Of Care: ____ [name] ([id number]) Electronic Signature(s) Signed: 08/04/2020 4:59:55 PM By: Georges Mouse, Minus Breeding  RN Entered By: Georges Mouse, Minus Breeding on 08/04/2020 16:12:01 [name] ([id number]) -------------------------------------------------------------------------------- Compression Therapy Details Patient Name: [name] Date  of Service: 08/04/2020 3:00 PM Medical Record Number: [id number] Patient Account Number: [id number] Date of Birth/Sex: Jul 02, 1932 (87 y.o. F) Treating RN: Dolan Amen Primary Care Jovanni Eckhart: Tracie Harrier Other Clinician: Referring Lindsee Labarre: Tracie Harrier Treating Sani Loiseau/Extender: Skipper Cliche in Treatment: 2 Compression Therapy Performed for Wound Assessment: Wound #1 Left,Midline Lower Leg Performed By: Clinician Dolan Amen, RN Compression Type: Three Layer Pre Treatment ABI: 1.3 Post Procedure Diagnosis Same as Pre-procedure Electronic Signature(s) Signed: 08/04/2020 4:59:55 PM By: Georges Mouse, Minus Breeding RN Entered By: Georges Mouse, Kenia on 08/04/2020 16:11:37 Nurse, [name] ([id number]) -------------------------------------------------------------------------------- Encounter Discharge Information Details Patient Name: [name] Date of Service: 08/04/2020 3:00 PM Medical Record Number: [id number] Patient Account Number: [id number] Date of Birth/Sex: Apr 06, 1932 (87 y.o. F) Treating RN: Donnamarie Poag Primary Care Wilbern Pennypacker: Tracie Harrier Other Clinician: Referring Trygve Thal: Tracie Harrier Treating Aija Scarfo/Extender: Skipper Cliche in Treatment: 2 Encounter Discharge Information Items Discharge Condition: Stable Ambulatory Status: Ambulatory Discharge Destination: Home Transportation: Private Auto Accompanied By: daughter Schedule Follow-up Appointment: Yes Clinical Summary of Care: Electronic Signature(s) Signed: 08/04/2020 5:10:42 PM By: Donnamarie Poag Entered By: Donnamarie Poag on 08/04/2020 16:26:41 Villarruel, [name]  ([id number]) -------------------------------------------------------------------------------- Lower Extremity Assessment Details Patient Name: [name]. Date of Service: 08/04/2020 3:00 PM Medical Record Number: [id number] Patient Account Number: [id number] Date of Birth/Sex: 10-Jan-1933 (87 y.o. F) Treating RN: Donnamarie Poag Primary Care Nello Corro: Tracie Harrier Other Clinician: Referring Batya Citron: Tracie Harrier Treating Yuvan Medinger/Extender: Skipper Cliche in Treatment: 2 Edema Assessment Assessed: [Left: Yes] [Right: No] Edema: [Left: Ye] [Right: s] Calf Left: Right: Point of Measurement: 30 cm From Medial Instep 37 cm Ankle Left: Right: Point of Measurement: 9 cm From Medial Instep 22 cm Vascular Assessment Pulses: Dorsalis Pedis Palpable: [Left:Yes] Electronic Signature(s) Signed: 08/04/2020 5:10:42 PM By: Donnamarie Poag Entered By: Donnamarie Poag on 08/04/2020 15:27:50 Bandel, [name] ([id number]) -------------------------------------------------------------------------------- Multi Wound Chart Details Patient Name: [name]. Date of Service: 08/04/2020 3:00 PM Medical Record Number: [id number] Patient Account Number: [id number] Date of Birth/Sex: 28-Feb-1932 (87 y.o. F) Treating RN: Dolan Amen Primary Care Ewing Fandino: Tracie Harrier Other Clinician: Referring Elif Yonts: Tracie Harrier Treating Benz Vandenberghe/Extender: Skipper Cliche in Treatment: 2 Vital Signs Height(in): 60 Pulse(bpm): 81 Weight(lbs): 155 Blood Pressure(mmHg): 142/74 Body Mass Index(BMI): 30 Temperature(F): 98.1 Respiratory Rate(breaths/min): 20 Photos: [N/A:N/A] Wound Location: Left, Midline Lower Leg N/A N/A Wounding Event: Gradually Appeared N/A N/A Primary Etiology: Venous Leg Ulcer N/A N/A Comorbid History: Lymphedema, Chronic Obstructive N/A N/A Pulmonary Disease (COPD), Congestive Heart Failure, Coronary Artery Disease, End Stage Renal Disease, Gout,  Osteoarthritis, Neuropathy, Received Chemotherapy Date Acquired: 06/21/2020 N/A N/A Weeks of Treatment: 2 N/A N/A Wound Status: Open N/A N/A Measurements L x W x D (cm) 0.5x0.3x0.2 N/A N/A Area (cm) : 0.118 N/A N/A Volume (cm) : 0.024 N/A N/A % Reduction in Area: 57.10% N/A N/A % Reduction in Volume: 56.40% N/A N/A Classification: Full Thickness Without Exposed N/A N/A Support Structures Exudate Amount: None Present N/A N/A Wound Margin: Flat and Intact N/A N/A Granulation Amount: Medium (34-66%) N/A N/A Necrotic Amount: Medium (34-66%) N/A N/A Exposed Structures: Fat Layer (Subcutaneous Tissue): N/A N/A Yes Fascia: No Tendon: No Muscle: No Joint: No Bone: No Epithelialization: None N/A N/A Treatment Notes Electronic Signature(s) Signed: 08/04/2020 4:59:55 PM By: Georges Mouse, Minus Breeding RN Entered By: Georges Mouse, Minus Breeding on 08/04/2020 16:11:15 Atteberry, [name] ([id number]) -------------------------------------------------------------------------------- Multi-Disciplinary Care Plan Details Patient Name: [name] Date of Service: 08/04/2020 3:00 PM Medical Record Number: [id number] Patient Account Number: [id number] Date  of Birth/Sex: 07/12/1932 (87 y.o. F) Treating RN: Dolan Amen Primary Care Viviana Trimble: Tracie Harrier Other Clinician: Referring Jeris Easterly: Tracie Harrier Treating Brick Ketcher/Extender: Skipper Cliche in Treatment: 2 Active Inactive Wound/Skin Impairment Nursing Diagnoses: Knowledge deficit related to ulceration/compromised skin integrity Goals: Patient/caregiver will verbalize understanding of skin care regimen Date Initiated: 07/21/2020 Target Resolution Date: 08/21/2020 Goal Status: Active Ulcer/skin breakdown will have a volume reduction of 30% by week 4 Date Initiated: 07/21/2020 Target Resolution Date: 08/21/2020 Goal Status: Active Ulcer/skin breakdown will have a volume reduction of 50% by week 8 Date Initiated: 07/21/2020 Target  Resolution Date: 09/20/2020 Goal Status: Active Ulcer/skin breakdown will have a volume reduction of 80% by week 12 Date Initiated: 07/21/2020 Target Resolution Date: 10/21/2020 Goal Status: Active Ulcer/skin breakdown will heal within 14 weeks Date Initiated: 07/21/2020 Target Resolution Date: 11/21/2020 Goal Status: Active Interventions: Assess patient/caregiver ability to obtain necessary supplies Assess patient/caregiver ability to perform ulcer/skin care regimen upon admission and as needed Assess ulceration(s) every visit Notes: Electronic Signature(s) Signed: 08/04/2020 4:59:55 PM By: Georges Mouse, Minus Breeding RN Entered By: Georges Mouse, Minus Breeding on 08/04/2020 16:11:09 [name] ([id number]) -------------------------------------------------------------------------------- Pain Assessment Details Patient Name: [name] Date of Service: 08/04/2020 3:00 PM Medical Record Number: [id number] Patient Account Number: [id number] Date of Birth/Sex: 1932/08/12 (87 y.o. F) Treating RN: Donnamarie Poag Primary Care Jakai Risse: Tracie Harrier Other Clinician: Referring Kathleene Bergemann: Tracie Harrier Treating Remmy Riffe/Extender: Skipper Cliche in Treatment: 2 Active Problems Location of Pain Severity and Description of Pain Patient Has Paino No Site Locations Rate the pain. Current Pain Level: 0 Pain Management and Medication Current Pain Management: Electronic Signature(s) Signed: 08/04/2020 5:10:42 PM By: Donnamarie Poag Entered By: Donnamarie Poag on 08/04/2020 15:21:57 Kamel, [name] ([id number]) -------------------------------------------------------------------------------- Patient/Caregiver Education Details Patient Name: [name] Date of Service: 08/04/2020 3:00 PM Medical Record Number: [id number] Patient Account Number: [id number] Date of Birth/Gender: [birth year] (87 y.o. F) Treating RN: Dolan Amen Primary Care Physician: Tracie Harrier Other  Clinician: Referring Physician: Tracie Harrier Treating Physician/Extender: Skipper Cliche in Treatment: 2 Education Assessment Education Provided To: Patient Education Topics Provided Wound/Skin Impairment: Methods: Explain/Verbal Responses: State content correctly Electronic Signature(s) Signed: 08/04/2020 4:59:55 PM By: Georges Mouse, Minus Breeding RN Entered By: Georges Mouse, Minus Breeding on 08/04/2020 16:12:16 Sudberry, [name] ([id number]) -------------------------------------------------------------------------------- Wound Assessment Details Patient Name: [name] Date of Service: 08/04/2020 3:00 PM Medical Record Number: [id number] Patient Account Number: [id number] Date of Birth/Sex: 07/08/1932 (87 y.o. F) Treating RN: Donnamarie Poag Primary Care Ryllie Nieland: Tracie Harrier Other Clinician: Referring Malashia Kamaka: Tracie Harrier Treating Raquan Iannone/Extender: Skipper Cliche in Treatment: 2 Wound Status Wound Number: 1 Primary Venous Leg Ulcer Etiology: Wound Location: Left, Midline Lower Leg Wound Open Wounding Event: Gradually Appeared Status: Date Acquired: 06/21/2020 Comorbid Lymphedema, Chronic Obstructive Pulmonary Disease Weeks Of Treatment: 2 History: (COPD), Congestive Heart Failure, Coronary Artery Disease, Clustered Wound: No End Stage Renal Disease, Gout, Osteoarthritis, Neuropathy, Received Chemotherapy Photos Wound Measurements Length: (cm) 0.5 Width: (cm) 0.3 Depth: (cm) 0.2 Area: (cm) 0.118 Volume: (cm) 0.024 % Reduction in Area: 57.1% % Reduction in Volume: 56.4% Epithelialization: None Tunneling: No Undermining: No Wound Description Classification: Full Thickness Without Exposed Support Structures Wound Margin: Flat and Intact Exudate Amount: None Present Foul Odor After Cleansing: No Slough/Fibrino No Wound Bed Granulation Amount: Medium (34-66%) Exposed Structure Necrotic Amount: Medium (34-66%) Fascia Exposed: No Necrotic Quality:  Adherent Slough Fat Layer (Subcutaneous Tissue) Exposed: Yes Tendon Exposed: No Muscle Exposed: No Joint Exposed: No Bone Exposed: No Treatment Notes Wound #  1 (Lower Leg) Wound Laterality: Left, Midline Cleanser Soap and Water Discharge Instruction: Gently cleanse wound with antibacterial soap, rinse and pat dry prior to dressing wounds Peri-Wound Care Triamcinolone Acetonide Cream, 0.1%, 15 (g) tube Crock, Kielyn J. ([id number]) Topical Primary Dressing Prisma 4.34 (in) Discharge Instruction: Moisten w/normal saline or sterile water; Cover wound as directed. Do not remove from wound bed. Secondary Dressing ABD Pad 5x9 (in/in) Discharge Instruction: Cover with ABD pad Secured With Compression Wrap Profore Lite LF 3 Multilayer Compression Bandaging System Discharge Instruction: Apply 3 multi-layer wrap as prescribed. Compression Stockings Add-Ons Electronic Signature(s) Signed: 08/04/2020 5:10:42 PM By: Donnamarie Poag Entered By: Donnamarie Poag on 08/04/2020 15:27:16 Saiz, [name] ([id number]) -------------------------------------------------------------------------------- Vitals Details Patient Name: [name] Date of Service: 08/04/2020 3:00 PM Medical Record Number: [id number] Patient Account Number: [id number] Date of Birth/Sex: Feb 26, 1932 (87 y.o. F) Treating RN: Donnamarie Poag Primary Care Heber Hoog: Tracie Harrier Other Clinician: Referring Koy Lamp: Tracie Harrier Treating Zayana Salvador/Extender: Skipper Cliche in Treatment: 2 Vital Signs Time Taken: 15:23 Temperature (F): 98.1 Height (in): 60 Pulse (bpm): 69 Weight (lbs): 155 Respiratory Rate (breaths/min): 20 Body Mass Index (BMI): 30.3 Blood Pressure (mmHg): 142/74 Reference Range: 80 - 120 mg / dl Electronic Signature(s) Signed: 08/04/2020 5:10:42 PM By: Donnamarie Poag Entered ByDonnamarie Poag on 08/04/2020 15:21:47

## 2020-08-11 ENCOUNTER — Encounter: Payer: Medicare Other | Admitting: Physician Assistant

## 2020-08-11 ENCOUNTER — Other Ambulatory Visit: Payer: Self-pay

## 2020-08-11 NOTE — Progress Notes (Addendum)
[name] ([id number]) Visit Report for 08/11/2020 Chief Complaint Document Details Patient Name: [name]. Date of Service: 08/11/2020 3:00 PM Medical Record Number: [id number] Patient Account Number: [id number] Date of Birth/Sex: 05-01-1932 (87 y.o. F) Treating RN: Carlene Coria Primary Care Provider: Tracie Harrier Other Clinician: Referring Provider: Tracie Harrier Treating Provider/Extender: Skipper Cliche in Treatment: 3 Information Obtained from: Patient Chief Complaint 07/21/2020; patient is here for review of a small wound on the left anterior mid tibia. Electronic Signature(s) Signed: 08/11/2020 3:11:24 PM By: Worthy Keeler PA-C Entered By: Worthy Keeler on 08/11/2020 15:11:24 [name], [name] ([id number]) -------------------------------------------------------------------------------- HPI Details Patient Name: [name] Date of Service: 08/11/2020 3:00 PM Medical Record Number: [id number] Patient Account Number: [id number] Date of Birth/Sex: January 20, 1933 (87 y.o. F) Treating RN: Carlene Coria Primary Care Provider: Tracie Harrier Other Clinician: Referring Provider: Tracie Harrier Treating Provider/Extender: Skipper Cliche in Treatment: 3 History of Present Illness HPI Description: ADMISSION 07/21/2020 This is an 87 year old woman who arrived in clinic accompanied by her daughter. Referred by Dr. Brendolyn Patty her dermatologist. She is felt to have venous insufficiency causing the swelling and skin breakdown. I note that she is also had a skin cancer recently removed just above where the wound is but Dr. Nicole Kindred did not feel that needed to be biopsied per the patient. She is also had a history of a skin cancer on the right posterior calf at some time in the past. Dr. Nicole Kindred placed an Louretta Parma boot on her the last time she was seen on 07/18/2020 she took this off at home because it was too tight and uncomfortable the wound itself has been there for  about a month. She came in with a Band-Aid over this and multiple Band-Aids on her leg so that this is the only open area I could see. She complains of a lot of pain in both legs that does not seem particularly activity related. She says she has a neuropathy related to a past history of chemotherapy for her lymphoma Past medical history includes neuropathy, gout, chronic kidney disease stage III, lymphedema, coronary artery disease with a stent, stasis dermatitis, thyroidectomy in 2021, low back pain, bilateral lower extremity edema. Paroxysmal atrial fibrillation and squamous cell CA. She was recently in hospital for congestive heart failure and delirium ABI in our clinic was 1.31 on the left 07/28/2020 upon evaluation today patient's wound actually showing signs of improvement. This is a little bit smaller although it is something that I believe is going to require little bit of sharp debridement today. Fortunately there does not appear to be any evidence of active infection which is great and overall I am extremely pleased with where things stand today. 08/04/2020 upon evaluation today patient appears to be doing well with regard to her wound. She has been tolerating the dressing changes without complication. Fortunately there does not appear to be any sign of active infection at this time. No fevers, chills, nausea, vomiting, or diarrhea. 08/11/2020 upon evaluation today patient appears to be doing well with regard to her wound. She has been tolerating the dressing changes without complication. Fortunately the wound seems to be doing great right now with regard to healing I feel like that in fact is getting more dry than anything and potentially she might actually do well to change this more frequently at home for that reason I think we may switch to Tubigrip. Electronic Signature(s) Signed: 08/11/2020 4:06:44 PM By: Worthy Keeler PA-C Entered By:  Worthy Keeler on 08/11/2020 16:06:43 [name] ([id number]) -------------------------------------------------------------------------------- Physical Exam Details Patient Name: [name]. Date of Service: 08/11/2020 3:00 PM Medical Record Number: [id number] Patient Account Number: [id number] Date of Birth/Sex: 1932-05-30 (87 y.o. F) Treating RN: Carlene Coria Primary Care Provider: Tracie Harrier Other Clinician: Referring Provider: Tracie Harrier Treating Provider/Extender: Skipper Cliche in Treatment: 3 Constitutional Well-nourished and well-hydrated in no acute distress. Respiratory normal breathing without difficulty. Psychiatric this patient is able to make decisions and demonstrates good insight into disease process. Alert and Oriented x 3. pleasant and cooperative. Notes Patient's wound bed showed signs of good granulation epithelization at this point. Fortunately there does not appear to be any signs of active infection at this time which is great news and overall I am extremely pleased with where things stand. No fevers, chills, nausea, vomiting, or diarrhea. Electronic Signature(s) Signed: 08/11/2020 4:06:58 PM By: Worthy Keeler PA-C Entered By: Worthy Keeler on 08/11/2020 16:06:58 [name] ([id number]) -------------------------------------------------------------------------------- Physician Orders Details Patient Name: [name] Date of Service: 08/11/2020 3:00 PM Medical Record Number: [id number] Patient Account Number: [id number] Date of Birth/Sex: 1932/08/09 (87 y.o. F) Treating RN: Carlene Coria Primary Care Provider: Tracie Harrier Other Clinician: Referring Provider: Tracie Harrier Treating Provider/Extender: Skipper Cliche in Treatment: 3 Verbal / Phone Orders: No Diagnosis Coding ICD-10 Coding Code Description [phone] Chronic venous hypertension (idiopathic) with ulcer and inflammation of left lower extremity L97.828 Non-pressure chronic ulcer of other part of  left lower leg with other specified severity G90.09 Other idiopathic peripheral autonomic neuropathy Follow-up Appointments o Return Appointment in 2 weeks. Bathing/ Shower/ Hygiene o May shower; gently cleanse wound with antibacterial soap, rinse and pat dry prior to dressing wounds - dial liquid soap Edema Control - Lymphedema / Segmental Compressive Device / Other o Tubigrip double layer applied - size E o Elevate, Exercise Daily and Avoid Standing for Long Periods of Time. o Elevate legs to the level of the heart and pump ankles as often as possible o Elevate leg(s) parallel to the floor when sitting. Wound Treatment Wound #1 - Lower Leg Wound Laterality: Left, Midline Cleanser: Soap and Water 3 x Per Week/30 Days Discharge Instructions: Gently cleanse wound with antibacterial soap, rinse and pat dry prior to dressing wounds Peri-Wound Care: Triamcinolone Acetonide Cream, 0.1%, 15 (g) tube 3 x Per Week/30 Days Primary Dressing: Prisma 4.34 (in) 3 x Per Week/30 Days Discharge Instructions: Moisten w/normal saline or sterile water; Cover wound as directed. Do not remove from wound bed. Secondary Dressing: ABD Pad 5x9 (in/in) 3 x Per Week/30 Days Discharge Instructions: Cover with ABD pad Electronic Signature(s) Signed: 08/11/2020 4:51:48 PM By: Worthy Keeler PA-C Signed: 08/16/2020 4:27:57 PM By: Carlene Coria RN Entered By: Carlene Coria on 08/11/2020 16:06:35 Barbato, [name] ([id number]) -------------------------------------------------------------------------------- Problem List Details Patient Name: ADDYSEN, LOUTH. Date of Service: 08/11/2020 3:00 PM Medical Record Number: [id number] Patient Account Number: [id number] Date of Birth/Sex: 29-Aug-1932 (87 y.o. F) Treating RN: Carlene Coria Primary Care Provider: Tracie Harrier Other Clinician: Referring Provider: Tracie Harrier Treating Provider/Extender: Skipper Cliche in Treatment: 3 Active  Problems ICD-10 Encounter Code Description Active Date MDM Diagnosis I87.332 Chronic venous hypertension (idiopathic) with ulcer and inflammation of 07/21/2020 No Yes left lower extremity L97.828 Non-pressure chronic ulcer of other part of left lower leg with other 07/21/2020 No Yes specified severity G90.09 Other idiopathic peripheral autonomic neuropathy 07/21/2020 No Yes Inactive Problems Resolved Problems Electronic Signature(s) Signed: 08/11/2020 3:11:19 PM  By: Worthy Keeler PA-C Entered By: Worthy Keeler on 08/11/2020 15:11:19 Commons, [name] ([id number]) -------------------------------------------------------------------------------- Progress Note Details Patient Name: MAHALA, ROMMEL. Date of Service: 08/11/2020 3:00 PM Medical Record Number: [id number] Patient Account Number: [id number] Date of Birth/Sex: Dec 15, 1932 (87 y.o. F) Treating RN: Carlene Coria Primary Care Provider: Tracie Harrier Other Clinician: Referring Provider: Tracie Harrier Treating Provider/Extender: Skipper Cliche in Treatment: 3 Subjective Chief Complaint Information obtained from Patient 07/21/2020; patient is here for review of a small wound on the left anterior mid tibia. History of Present Illness (HPI) ADMISSION 07/21/2020 This is an 87 year old woman who arrived in clinic accompanied by her daughter. Referred by Dr. Brendolyn Patty her dermatologist. She is felt to have venous insufficiency causing the swelling and skin breakdown. I note that she is also had a skin cancer recently removed just above where the wound is but Dr. Nicole Kindred did not feel that needed to be biopsied per the patient. She is also had a history of a skin cancer on the right posterior calf at some time in the past. Dr. Nicole Kindred placed an Louretta Parma boot on her the last time she was seen on 07/18/2020 she took this off at home because it was too tight and uncomfortable the wound itself has been there for about a month. She came in  with a Band-Aid over this and multiple Band-Aids on her leg so that this is the only open area I could see. She complains of a lot of pain in both legs that does not seem particularly activity related. She says she has a neuropathy related to a past history of chemotherapy for her lymphoma Past medical history includes neuropathy, gout, chronic kidney disease stage III, lymphedema, coronary artery disease with a stent, stasis dermatitis, thyroidectomy in 2021, low back pain, bilateral lower extremity edema. Paroxysmal atrial fibrillation and squamous cell CA. She was recently in hospital for congestive heart failure and delirium ABI in our clinic was 1.31 on the left 07/28/2020 upon evaluation today patient's wound actually showing signs of improvement. This is a little bit smaller although it is something that I believe is going to require little bit of sharp debridement today. Fortunately there does not appear to be any evidence of active infection which is great and overall I am extremely pleased with where things stand today. 08/04/2020 upon evaluation today patient appears to be doing well with regard to her wound. She has been tolerating the dressing changes without complication. Fortunately there does not appear to be any sign of active infection at this time. No fevers, chills, nausea, vomiting, or diarrhea. 08/11/2020 upon evaluation today patient appears to be doing well with regard to her wound. She has been tolerating the dressing changes without complication. Fortunately the wound seems to be doing great right now with regard to healing I feel like that in fact is getting more dry than anything and potentially she might actually do well to change this more frequently at home for that reason I think we may switch to Tubigrip. Objective Constitutional Well-nourished and well-hydrated in no acute distress. Vitals Time Taken: 3:02 PM, Height: 60 in, Weight: 155 lbs, BMI: 30.3, Temperature:  98.69 F, Pulse: 96 bpm, Respiratory Rate: 18 breaths/min, Blood Pressure: 128/77 mmHg. Respiratory normal breathing without difficulty. Psychiatric this patient is able to make decisions and demonstrates good insight into disease process. Alert and Oriented x 3. pleasant and cooperative. General Notes: Patient's wound bed showed signs of good granulation epithelization at this point.  Fortunately there does not appear to be any signs of active infection at this time which is great news and overall I am extremely pleased with where things stand. No fevers, chills, nausea, vomiting, or diarrhea. Integumentary (Hair, Skin) Hefferan, Kashari J. ([id number]) Wound #1 status is Open. Original cause of wound was Gradually Appeared. The date acquired was: 06/21/2020. The wound has been in treatment 3 weeks. The wound is located on the Left,Midline Lower Leg. The wound measures 0.4cm length x 0.3cm width x 0.1cm depth; 0.094cm^2 area and 0.009cm^3 volume. There is Fat Layer (Subcutaneous Tissue) exposed. There is no tunneling or undermining noted. There is a small amount of serous drainage noted. The wound margin is flat and intact. There is small (1-33%) red granulation within the wound bed. There is a large (67- 100%) amount of necrotic tissue within the wound bed including Eschar and Adherent Slough. 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 Bathing/ Shower/ Hygiene: May shower; gently cleanse wound with antibacterial soap, rinse and pat dry prior to dressing wounds - dial liquid soap Edema Control - Lymphedema / Segmental Compressive Device / Other: Tubigrip double layer applied - size E Elevate, Exercise Daily and  Avoid Standing for Long Periods of Time. Elevate legs to the level of the heart and pump ankles as often as possible Elevate leg(s) parallel to the floor when sitting. WOUND #1: - Lower Leg Wound Laterality: Left, Midline Cleanser: Soap and Water 3 x Per Week/30 Days Discharge Instructions: Gently cleanse wound with antibacterial soap, rinse and pat dry prior to dressing wounds Peri-Wound Care: Triamcinolone Acetonide Cream, 0.1%, 15 (g) tube 3 x Per Week/30 Days Primary Dressing: Prisma 4.34 (in) 3 x Per Week/30 Days Discharge Instructions: Moisten w/normal saline or sterile water; Cover wound as directed. Do not remove from wound bed. Secondary Dressing: ABD Pad 5x9 (in/in) 3 x Per Week/30 Days Discharge Instructions: Cover with ABD pad 1. Would recommend currently that we go ahead and initiate treatment with a change we will continue with the collagen I think this is good. 2. With regard to the change working to discontinue the compression wrap and active use Tubigrip double layer size E in order to help with the swelling. This will hopefully prevent her from drying out as far as the dressing is concerned. I think that is appropriate. Also believe that the patient will benefit from being able to shower and wash the wound especially since she will be able to come next Friday and will be actually 2 weeks from now before she is able to get back into the clinic. We will see patient back for reevaluation in 2 weeks here in the clinic. If anything worsens or changes patient will contact our office for additional recommendations. Electronic Signature(s) Signed: 08/11/2020 4:07:56 PM By: Worthy Keeler PA-C Entered By: Worthy Keeler on 08/11/2020 16:07:56 White, [name] ([id number]) -------------------------------------------------------------------------------- SuperBill Details Patient Name: [name] Date of Service: 08/11/2020 Medical Record Number: [id number] Patient Account  Number: [id number] Date of Birth/Sex: 23-Apr-1932 (87 y.o. F) Treating RN: Carlene Coria Primary Care Provider: Tracie Harrier Other Clinician: Referring Provider: Tracie Harrier Treating Provider/Extender: Skipper Cliche in Treatment: 3 Diagnosis Coding ICD-10 Codes Code Description [phone] Chronic venous hypertension (idiopathic) with ulcer and inflammation of left lower extremity L97.828  Non-pressure chronic ulcer of other part of left lower leg with other specified severity G90.09 Other idiopathic peripheral autonomic neuropathy Physician Procedures CPT4 Code Description: [id number] - WC PHYS LEVEL 3 - EST PT Modifier: Quantity: 1 CPT4 Code Description: ICD-10 Diagnosis Description I87.332 Chronic venous hypertension (idiopathic) with ulcer and inflammation of le L97.828 Non-pressure chronic ulcer of other part of left lower leg with other spec G90.09 Other idiopathic peripheral  autonomic neuropathy Modifier: ft lower extremity ified severity Quantity: Electronic Signature(s) Signed: 08/11/2020 4:51:48 PM By: Worthy Keeler PA-C Signed: 08/16/2020 4:27:57 PM By: Carlene Coria RN Previous Signature: 08/11/2020 4:08:10 PM Version By: Worthy Keeler PA-C Entered By: Carlene Coria on 08/11/2020 16:08:48

## 2020-08-16 NOTE — Progress Notes (Signed)
[name] ([id number]) Visit Report for 08/11/2020 Arrival Information Details Patient Name: [name]. Date of Service: 08/11/2020 3:00 PM Medical Record Number: [id number] Patient Account Number: [id number] Date of Birth/Sex: 06-19-1932 (87 y.o. F) Treating RN: Carlene Coria Primary Care Yui Mulvaney: Tracie Harrier Other Clinician: Referring Galdino Hinchman: Tracie Harrier Treating Remijio Holleran/Extender: Skipper Cliche in Treatment: 3 Visit Information History Since Last Visit Added or deleted any medications: No Patient Arrived: [name] Had a fall or experienced change in No Arrival Time: 14:59 activities of daily living that may affect Accompanied By: self risk of falls: Transfer Assistance: None Hospitalized since last visit: No Patient Identification Verified: Yes Pain Present Now: No Secondary Verification Process Completed: Yes Patient Requires Transmission-Based Precautions: No Patient Has Alerts: Yes Patient Alerts: NOT DIABETIC Electronic Signature(s) Signed: 08/15/2020 4:23:36 PM By: Jeanine Luz Entered By: Jeanine Luz on 08/11/2020 15:02:02 [name], [name] ([id number]) -------------------------------------------------------------------------------- Clinic Level of Care Assessment Details Patient Name: [name] Date of Service: 08/11/2020 3:00 PM Medical Record Number: [id number] Patient Account Number: [id number] Date of Birth/Sex: [birth year] (87 y.o. F) Treating RN: Carlene Coria Primary Care Shayra Anton: Tracie Harrier Other Clinician: Referring Tye Vigo: Tracie Harrier Treating Alicianna Litchford/Extender: Skipper Cliche in Treatment: 3 Clinic Level of Care Assessment Items TOOL 4 Quantity Score X - Use when only an EandM is performed on FOLLOW-UP visit 1 0 ASSESSMENTS - Nursing Assessment / Reassessment X - Reassessment of Co-morbidities (includes updates in patient status) 1 10 X- 1 5 Reassessment of Adherence to Treatment Plan ASSESSMENTS -  Wound and Skin Assessment / Reassessment X - Simple Wound Assessment / Reassessment - one wound 1 5 []  - 0 Complex Wound Assessment / Reassessment - multiple wounds []  - 0 Dermatologic / Skin Assessment (not related to wound area) ASSESSMENTS - Focused Assessment []  - Circumferential Edema Measurements - multi extremities 0 []  - 0 Nutritional Assessment / Counseling / Intervention []  - 0 Lower Extremity Assessment (monofilament, tuning fork, pulses) []  - 0 Peripheral Arterial Disease Assessment (using hand held doppler) ASSESSMENTS - Ostomy and/or Continence Assessment and Care []  - Incontinence Assessment and Management 0 []  - 0 Ostomy Care Assessment and Management (repouching, etc.) PROCESS - Coordination of Care X - Simple Patient / Family Education for ongoing care 1 15 []  - 0 Complex (extensive) Patient / Family Education for ongoing care X- 1 10 Staff obtains Consents, Records, Test Results / Process Orders []  - 0 Staff telephones HHA, Nursing Homes / Clarify orders / etc []  - 0 Routine Transfer to another Facility (non-emergent condition) []  - 0 Routine Hospital Admission (non-emergent condition) []  - 0 New Admissions / Biomedical engineer / Ordering NPWT, Apligraf, etc. []  - 0 Emergency Hospital Admission (emergent condition) X- 1 10 Simple Discharge Coordination []  - 0 Complex (extensive) Discharge Coordination PROCESS - Special Needs []  - Pediatric / Minor Patient Management 0 []  - 0 Isolation Patient Management []  - 0 Hearing / Language / Visual special needs []  - 0 Assessment of Community assistance (transportation, D/C planning, etc.) []  - 0 Additional assistance / Altered mentation []  - 0 Support Surface(s) Assessment (bed, cushion, seat, etc.) INTERVENTIONS - Wound Cleansing / Measurement [name], [name] J. ([id number]) X- 1 5 Simple Wound Cleansing - one wound []  - 0 Complex Wound Cleansing - multiple wounds X- 1 5 Wound Imaging (photographs  - any number of wounds) []  - 0 Wound Tracing (instead of photographs) X- 1 5 Simple Wound Measurement - one wound []  - 0 Complex Wound Measurement - multiple  wounds INTERVENTIONS - Wound Dressings X - Small Wound Dressing one or multiple wounds 1 10 []  - 0 Medium Wound Dressing one or multiple wounds []  - 0 Large Wound Dressing one or multiple wounds X- 1 5 Application of Medications - topical []  - 0 Application of Medications - injection INTERVENTIONS - Miscellaneous []  - External ear exam 0 []  - 0 Specimen Collection (cultures, biopsies, blood, body fluids, etc.) []  - 0 Specimen(s) / Culture(s) sent or taken to Lab for analysis []  - 0 Patient Transfer (multiple staff / Civil Service fast streamer / Similar devices) []  - 0 Simple Staple / Suture removal (25 or less) []  - 0 Complex Staple / Suture removal (26 or more) []  - 0 Hypo / Hyperglycemic Management (close monitor of Blood Glucose) []  - 0 Ankle / Brachial Index (ABI) - do not check if billed separately X- 1 5 Vital Signs Has the patient been seen at the hospital within the last three years: Yes Total Score: 90 Level Of Care: New/Established - Level 3 Electronic Signature(s) Signed: 08/16/2020 4:27:57 PM By: Carlene Coria RN Entered By: Carlene Coria on 08/11/2020 16:08:13 [name], [name] ([id number]) -------------------------------------------------------------------------------- Lower Extremity Assessment Details Patient Name: [name] Date of Service: 08/11/2020 3:00 PM Medical Record Number: [id number] Patient Account Number: [id number] Date of Birth/Sex: 12/15/1932 (87 y.o. F) Treating RN: Carlene Coria Primary Care Hilaria Titsworth: Tracie Harrier Other Clinician: Referring Latonya Knight: Tracie Harrier Treating Karlon Schlafer/Extender: Skipper Cliche in Treatment: 3 Edema Assessment Assessed: [Left: No] [Right: No] [Left: Edema] [Right: :] Calf Left: Right: Point of Measurement: 30 cm From Medial Instep 37  cm Ankle Left: Right: Point of Measurement: 9 cm From Medial Instep 21.3 cm Vascular Assessment Pulses: Dorsalis Pedis Palpable: [Left:Yes] Electronic Signature(s) Signed: 08/15/2020 4:23:36 PM By: Jeanine Luz Signed: 08/16/2020 4:27:57 PM By: Carlene Coria RN Entered By: Jeanine Luz on 08/11/2020 15:13:52 [name], [name] ([id number]) -------------------------------------------------------------------------------- Multi Wound Chart Details Patient Name: [name]. Date of Service: 08/11/2020 3:00 PM Medical Record Number: [id number] Patient Account Number: [id number] Date of Birth/Sex: 1932/12/17 (87 y.o. F) Treating RN: Carlene Coria Primary Care Danalee Flath: Tracie Harrier Other Clinician: Referring Cindia Hustead: Tracie Harrier Treating Masiya Claassen/Extender: Skipper Cliche in Treatment: 3 Vital Signs Height(in): 60 Pulse(bpm): 96 Weight(lbs): 155 Blood Pressure(mmHg): 128/77 Body Mass Index(BMI): 30 Temperature(F): 98.69 Respiratory Rate(breaths/min): 18 Photos: [N/A:N/A] Wound Location: Left, Midline Lower Leg N/A N/A Wounding Event: Gradually Appeared N/A N/A Primary Etiology: Venous Leg Ulcer N/A N/A Comorbid History: Lymphedema, Chronic Obstructive N/A N/A Pulmonary Disease (COPD), Congestive Heart Failure, Coronary Artery Disease, End Stage Renal Disease, Gout, Osteoarthritis, Neuropathy, Received Chemotherapy Date Acquired: 06/21/2020 N/A N/A Weeks of Treatment: 3 N/A N/A Wound Status: Open N/A N/A Measurements L x W x D (cm) 0.4x0.3x0.1 N/A N/A Area (cm) : 0.094 N/A N/A Volume (cm) : 0.009 N/A N/A % Reduction in Area: 65.80% N/A N/A % Reduction in Volume: 83.60% N/A N/A Classification: Full Thickness Without Exposed N/A N/A Support Structures Exudate Amount: Small N/A N/A Exudate Type: Serous N/A N/A Exudate Color: amber N/A N/A Wound Margin: Flat and Intact N/A N/A Granulation Amount: Small (1-33%) N/A N/A Granulation Quality: Red N/A  N/A Necrotic Amount: Large (67-100%) N/A N/A Necrotic Tissue: Eschar, Adherent Slough N/A N/A Exposed Structures: Fat Layer (Subcutaneous Tissue): N/A N/A Yes Fascia: No Tendon: No Muscle: No Joint: No Bone: No Epithelialization: None N/A N/A Treatment Notes Electronic Signature(s) [name], [name] ([id number]) Signed: 08/16/2020 4:27:57 PM By: Carlene Coria RN Entered By: Carlene Coria on 08/11/2020 16:01:55  [name], [name] ([id number]) -------------------------------------------------------------------------------- Multi-Disciplinary Care Plan Details Patient Name: [name], [name]. Date of Service: 08/11/2020 3:00 PM Medical Record Number: [id number] Patient Account Number: [id number] Date of Birth/Sex: 11-06-1932 (87 y.o. F) Treating RN: Carlene Coria Primary Care Zebediah Beezley: Tracie Harrier Other Clinician: Referring Nirav Sweda: Tracie Harrier Treating Luisdaniel Kenton/Extender: Skipper Cliche in Treatment: 3 Active Inactive Wound/Skin Impairment Nursing Diagnoses: Knowledge deficit related to ulceration/compromised skin integrity Goals: Patient/caregiver will verbalize understanding of skin care regimen Date Initiated: 07/21/2020 Date Inactivated: 08/11/2020 Target Resolution Date: 08/21/2020 Goal Status: Met Ulcer/skin breakdown will have a volume reduction of 30% by week 4 Date Initiated: 07/21/2020 Date Inactivated: 08/11/2020 Target Resolution Date: 08/21/2020 Goal Status: Met Ulcer/skin breakdown will have a volume reduction of 50% by week 8 Date Initiated: 07/21/2020 Target Resolution Date: 09/20/2020 Goal Status: Active Ulcer/skin breakdown will have a volume reduction of 80% by week 12 Date Initiated: 07/21/2020 Target Resolution Date: 10/21/2020 Goal Status: Active Ulcer/skin breakdown will heal within 14 weeks Date Initiated: 07/21/2020 Target Resolution Date: 11/21/2020 Goal Status: Active Interventions: Assess patient/caregiver ability to obtain necessary  supplies Assess patient/caregiver ability to perform ulcer/skin care regimen upon admission and as needed Assess ulceration(s) every visit Notes: Electronic Signature(s) Signed: 08/16/2020 4:27:57 PM By: Carlene Coria RN Entered By: Carlene Coria on 08/11/2020 16:01:47 Slay, [name] ([id number]) -------------------------------------------------------------------------------- Pain Assessment Details Patient Name: [name] Date of Service: 08/11/2020 3:00 PM Medical Record Number: [id number] Patient Account Number: [id number] Date of Birth/Sex: [birth year] (87 y.o. F) Treating RN: Carlene Coria Primary Care Tige Meas: Tracie Harrier Other Clinician: Referring Florance Paolillo: Tracie Harrier Treating Atavia Poppe/Extender: Skipper Cliche in Treatment: 3 Active Problems Location of Pain Severity and Description of Pain Patient Has Paino No Site Locations Rate the pain. Current Pain Level: 0 Pain Management and Medication Current Pain Management: Electronic Signature(s) Signed: 08/15/2020 4:23:36 PM By: Jeanine Luz Signed: 08/16/2020 4:27:57 PM By: Carlene Coria RN Entered By: Jeanine Luz on 08/11/2020 15:05:32 [name], [name] ([id number]) -------------------------------------------------------------------------------- Patient/Caregiver Education Details Patient Name: [name] Date of Service: 08/11/2020 3:00 PM Medical Record Number: [id number] Patient Account Number: [id number] Date of Birth/Gender: 05-03-1932 (87 y.o. F) Treating RN: Carlene Coria Primary Care Physician: Tracie Harrier Other Clinician: Referring Physician: Tracie Harrier Treating Physician/Extender: Skipper Cliche in Treatment: 3 Education Assessment Education Provided To: Patient Education Topics Provided Wound/Skin Impairment: Methods: Explain/Verbal Responses: State content correctly Electronic Signature(s) Signed: 08/16/2020 4:27:57 PM By: Carlene Coria RN Entered By: Carlene Coria  on 08/11/2020 16:08:58 [name], [name] ([id number]) -------------------------------------------------------------------------------- Wound Assessment Details Patient Name: [name] Date of Service: 08/11/2020 3:00 PM Medical Record Number: [id number] Patient Account Number: [id number] Date of Birth/Sex: January 11, 1933 (87 y.o. F) Treating RN: Carlene Coria Primary Care Bradely Rudin: Tracie Harrier Other Clinician: Referring Tahani Potier: Tracie Harrier Treating Stella Encarnacion/Extender: Skipper Cliche in Treatment: 3 Wound Status Wound Number: 1 Primary Venous Leg Ulcer Etiology: Wound Location: Left, Midline Lower Leg Wound Open Wounding Event: Gradually Appeared Status: Date Acquired: 06/21/2020 Comorbid Lymphedema, Chronic Obstructive Pulmonary Disease Weeks Of Treatment: 3 History: (COPD), Congestive Heart Failure, Coronary Artery Disease, Clustered Wound: No End Stage Renal Disease, Gout, Osteoarthritis, Neuropathy, Received Chemotherapy Photos Wound Measurements Length: (cm) 0.4 Width: (cm) 0.3 Depth: (cm) 0.1 Area: (cm) 0.094 Volume: (cm) 0.009 % Reduction in Area: 65.8% % Reduction in Volume: 83.6% Epithelialization: None Tunneling: No Undermining: No Wound Description Classification: Full Thickness Without Exposed Support Structu Wound Margin: Flat and Intact Exudate Amount: Small Exudate Type: Serous Exudate Color: amber res Foul Odor After  Cleansing: No Slough/Fibrino No Wound Bed Granulation Amount: Small (1-33%) Exposed Structure Granulation Quality: Red Fascia Exposed: No Necrotic Amount: Large (67-100%) Fat Layer (Subcutaneous Tissue) Exposed: Yes Necrotic Quality: Eschar, Adherent Slough Tendon Exposed: No Muscle Exposed: No Joint Exposed: No Bone Exposed: No Electronic Signature(s) Signed: 08/15/2020 4:23:36 PM By: Jeanine Luz Signed: 08/16/2020 4:27:57 PM By: Carlene Coria RN Entered By: Jeanine Luz on 08/11/2020 15:12:02 [name], [name] ([id number]) -------------------------------------------------------------------------------- Vitals Details Patient Name: [name]. Date of Service: 08/11/2020 3:00 PM Medical Record Number: [id number] Patient Account Number: [id number] Date of Birth/Sex: 1932-07-06 (87 y.o. F) Treating RN: Carlene Coria Primary Care Jakira Mcfadden: Tracie Harrier Other Clinician: Referring Treyshaun Keatts: Tracie Harrier Treating Aamirah Salmi/Extender: Skipper Cliche in Treatment: 3 Vital Signs Time Taken: 15:02 Temperature (F): 98.69 Height (in): 60 Pulse (bpm): 96 Weight (lbs): 155 Respiratory Rate (breaths/min): 18 Body Mass Index (BMI): 30.3 Blood Pressure (mmHg): 128/77 Reference Range: 80 - 120 mg / dl Electronic Signature(s) Signed: 08/15/2020 4:23:36 PM By: Jeanine Luz Entered By: Jeanine Luz on 08/11/2020 15:05:25

## 2020-08-25 ENCOUNTER — Other Ambulatory Visit: Payer: Self-pay

## 2020-08-25 ENCOUNTER — Encounter: Payer: Medicare Other | Attending: Physician Assistant | Admitting: Physician Assistant

## 2020-08-25 DIAGNOSIS — G629 Polyneuropathy, unspecified: Secondary | ICD-10-CM | POA: Diagnosis not present

## 2020-08-25 DIAGNOSIS — I872 Venous insufficiency (chronic) (peripheral): Secondary | ICD-10-CM | POA: Diagnosis not present

## 2020-08-25 DIAGNOSIS — I480 Paroxysmal atrial fibrillation: Secondary | ICD-10-CM | POA: Diagnosis not present

## 2020-08-25 DIAGNOSIS — G9009 Other idiopathic peripheral autonomic neuropathy: Secondary | ICD-10-CM | POA: Diagnosis not present

## 2020-08-25 DIAGNOSIS — L97828 Non-pressure chronic ulcer of other part of left lower leg with other specified severity: Secondary | ICD-10-CM | POA: Diagnosis present

## 2020-08-25 DIAGNOSIS — I509 Heart failure, unspecified: Secondary | ICD-10-CM | POA: Insufficient documentation

## 2020-08-25 DIAGNOSIS — N186 End stage renal disease: Secondary | ICD-10-CM | POA: Insufficient documentation

## 2020-08-25 DIAGNOSIS — Z955 Presence of coronary angioplasty implant and graft: Secondary | ICD-10-CM | POA: Diagnosis not present

## 2020-08-25 DIAGNOSIS — I890 Lymphedema, not elsewhere classified: Secondary | ICD-10-CM | POA: Diagnosis not present

## 2020-08-25 DIAGNOSIS — I132 Hypertensive heart and chronic kidney disease with heart failure and with stage 5 chronic kidney disease, or end stage renal disease: Secondary | ICD-10-CM | POA: Insufficient documentation

## 2020-08-25 NOTE — Progress Notes (Addendum)
Tamara, Erickson ([id number]) Visit Report for 08/25/2020 Chief Complaint Document Details Patient Name: [name], [name]. Date of Service: 08/25/2020 3:45 PM Medical Record Number: [id number] Patient Account Number: [id number] Date of Birth/Sex: 1932/11/05 (87 y.o. F) Treating RN: Carlene Coria Primary Care Provider: Tracie Harrier Other Clinician: Referring Provider: Tracie Harrier Treating Provider/Extender: Skipper Cliche in Treatment: 5 Information Obtained from: Patient Chief Complaint 07/21/2020; patient is here for review of a small wound on the left anterior mid tibia. Electronic Signature(s) Signed: 08/25/2020 3:55:01 PM By: Worthy Keeler PA-C Entered By: Worthy Keeler on 08/25/2020 15:55:01 [name], [name] ([id number]) -------------------------------------------------------------------------------- HPI Details Patient Name: [name] Date of Service: 08/25/2020 3:45 PM Medical Record Number: [id number] Patient Account Number: [id number] Date of Birth/Sex: May 06, 1932 (87 y.o. F) Treating RN: Carlene Coria Primary Care Provider: Tracie Harrier Other Clinician: Referring Provider: Tracie Harrier Treating Provider/Extender: Skipper Cliche in Treatment: 5 History of Present Illness HPI Description: ADMISSION 07/21/2020 This is an 87 year old woman who arrived in clinic accompanied by her daughter. Referred by Dr. Brendolyn Patty her dermatologist. She is felt to have venous insufficiency causing the swelling and skin breakdown. I note that she is also had a skin cancer recently removed just above where the wound is but Dr. Nicole Kindred did not feel that needed to be biopsied per the patient. She is also had a history of a skin cancer on the right posterior calf at some time in the past. Dr. Nicole Kindred placed an Louretta Parma boot on her the last time she was seen on 07/18/2020 she took this off at home because it was too tight and uncomfortable the wound itself has been there for  about a month. She came in with a Band-Aid over this and multiple Band-Aids on her leg so that this is the only open area I could see. She complains of a lot of pain in both legs that does not seem particularly activity related. She says she has a neuropathy related to a past history of chemotherapy for her lymphoma Past medical history includes neuropathy, gout, chronic kidney disease stage III, lymphedema, coronary artery disease with a stent, stasis dermatitis, thyroidectomy in 2021, low back pain, bilateral lower extremity edema. Paroxysmal atrial fibrillation and squamous cell CA. She was recently in hospital for congestive heart failure and delirium ABI in our clinic was 1.31 on the left 07/28/2020 upon evaluation today patient's wound actually showing signs of improvement. This is a little bit smaller although it is something that I believe is going to require little bit of sharp debridement today. Fortunately there does not appear to be any evidence of active infection which is great and overall I am extremely pleased with where things stand today. 08/04/2020 upon evaluation today patient appears to be doing well with regard to her wound. She has been tolerating the dressing changes without complication. Fortunately there does not appear to be any sign of active infection at this time. No fevers, chills, nausea, vomiting, or diarrhea. 08/11/2020 upon evaluation today patient appears to be doing well with regard to her wound. She has been tolerating the dressing changes without complication. Fortunately the wound seems to be doing great right now with regard to healing I feel like that in fact is getting more dry than anything and potentially she might actually do well to change this more frequently at home for that reason I think we may switch to Tubigrip. 08/25/2020 upon evaluation today patient appears to be doing excellent in regard to  her leg ulcer. She has been tolerating the dressing  changes without complication. In general I think she appears to be completely healed in my opinion. Electronic Signature(s) Signed: 08/25/2020 4:08:01 PM By: Worthy Keeler PA-C Entered By: Worthy Keeler on 08/25/2020 16:08:01 [name], [name] ([id number]) -------------------------------------------------------------------------------- Physical Exam Details Patient Name: Tamara, Erickson. Date of Service: 08/25/2020 3:45 PM Medical Record Number: [id number] Patient Account Number: [id number] Date of Birth/Sex: 1932-10-31 (87 y.o. F) Treating RN: Carlene Coria Primary Care Provider: Tracie Harrier Other Clinician: Referring Provider: Tracie Harrier Treating Provider/Extender: Skipper Cliche in Treatment: 5 Constitutional Well-nourished and well-hydrated in no acute distress. Respiratory normal breathing without difficulty. Psychiatric this patient is able to make decisions and demonstrates good insight into disease process. Alert and Oriented x 3. pleasant and cooperative. Notes Patient's wound bed showed signs of good granulation epithelization at this point. There does not appear to be any evidence of active infection which is great news and overall very pleased. She does have some stasis dermatitis noted but again the temperature is really not different at this location versus the remainder of the leg I do not believe this represents cellulitis at all to be honest. Electronic Signature(s) Signed: 08/25/2020 4:08:21 PM By: Worthy Keeler PA-C Entered By: Worthy Keeler on 08/25/2020 16:08:21 [name] ([id number]) -------------------------------------------------------------------------------- Physician Orders Details Patient Name: [name] Date of Service: 08/25/2020 3:45 PM Medical Record Number: [id number] Patient Account Number: [id number] Date of Birth/Sex: March 19, 1932 (87 y.o. F) Treating RN: Dolan Amen Primary Care Provider: Tracie Harrier Other  Clinician: Referring Provider: Tracie Harrier Treating Provider/Extender: Skipper Cliche in Treatment: 5 Verbal / Phone Orders: No Diagnosis Coding ICD-10 Coding Code Description [phone] Chronic venous hypertension (idiopathic) with ulcer and inflammation of left lower extremity L97.828 Non-pressure chronic ulcer of other part of left lower leg with other specified severity G90.09 Other idiopathic peripheral autonomic neuropathy Discharge From [facility] o Discharge from [facility] o Wear compression garments daily. Put garments on first thing when you wake up and remove them before bed. o Moisturize legs daily after removing compression garments. Electronic Signature(s) Signed: 08/25/2020 4:09:56 PM By: Worthy Keeler PA-C Signed: 08/25/2020 4:25:03 PM By: Dolan Amen RN Entered By: Dolan Amen on 08/25/2020 16:03:15 [name], [name] ([id number]) -------------------------------------------------------------------------------- Problem List Details Patient Name: [name], [name]. Date of Service: 08/25/2020 3:45 PM Medical Record Number: [id number] Patient Account Number: [id number] Date of Birth/Sex: 25-Mar-1932 (87 y.o. F) Treating RN: Carlene Coria Primary Care Provider: Tracie Harrier Other Clinician: Referring Provider: Tracie Harrier Treating Provider/Extender: Skipper Cliche in Treatment: 5 Active Problems ICD-10 Encounter Code Description Active Date MDM Diagnosis I87.332 Chronic venous hypertension (idiopathic) with ulcer and inflammation of 07/21/2020 No Yes left lower extremity L97.828 Non-pressure chronic ulcer of other part of left lower leg with other 07/21/2020 No Yes specified severity G90.09 Other idiopathic peripheral autonomic neuropathy 07/21/2020 No Yes Inactive Problems Resolved Problems Electronic Signature(s) Signed: 08/25/2020 3:54:55 PM By: Worthy Keeler PA-C Entered By: Worthy Keeler on 08/25/2020  15:54:55 [name], [name] ([id number]) -------------------------------------------------------------------------------- Progress Note Details Patient Name: [name] Date of Service: 08/25/2020 3:45 PM Medical Record Number: [id number] Patient Account Number: [id number] Date of Birth/Sex: 1932/03/17 (87 y.o. F) Treating RN: Carlene Coria Primary Care Provider: Tracie Harrier Other Clinician: Referring Provider: Tracie Harrier Treating Provider/Extender: Skipper Cliche in Treatment: 5 Subjective Chief Complaint Information obtained from Patient 07/21/2020; patient is here for review of  a small wound on the left anterior mid tibia. History of Present Illness (HPI) ADMISSION 07/21/2020 This is an 87 year old woman who arrived in clinic accompanied by her daughter. Referred by Dr. Brendolyn Patty her dermatologist. She is felt to have venous insufficiency causing the swelling and skin breakdown. I note that she is also had a skin cancer recently removed just above where the wound is but Dr. Nicole Kindred did not feel that needed to be biopsied per the patient. She is also had a history of a skin cancer on the right posterior calf at some time in the past. Dr. Nicole Kindred placed an Louretta Parma boot on her the last time she was seen on 07/18/2020 she took this off at home because it was too tight and uncomfortable the wound itself has been there for about a month. She came in with a Band-Aid over this and multiple Band-Aids on her leg so that this is the only open area I could see. She complains of a lot of pain in both legs that does not seem particularly activity related. She says she has a neuropathy related to a past history of chemotherapy for her lymphoma Past medical history includes neuropathy, gout, chronic kidney disease stage III, lymphedema, coronary artery disease with a stent, stasis dermatitis, thyroidectomy in 2021, low back pain, bilateral lower extremity edema. Paroxysmal atrial  fibrillation and squamous cell CA. She was recently in hospital for congestive heart failure and delirium ABI in our clinic was 1.31 on the left 07/28/2020 upon evaluation today patient's wound actually showing signs of improvement. This is a little bit smaller although it is something that I believe is going to require little bit of sharp debridement today. Fortunately there does not appear to be any evidence of active infection which is great and overall I am extremely pleased with where things stand today. 08/04/2020 upon evaluation today patient appears to be doing well with regard to her wound. She has been tolerating the dressing changes without complication. Fortunately there does not appear to be any sign of active infection at this time. No fevers, chills, nausea, vomiting, or diarrhea. 08/11/2020 upon evaluation today patient appears to be doing well with regard to her wound. She has been tolerating the dressing changes without complication. Fortunately the wound seems to be doing great right now with regard to healing I feel like that in fact is getting more dry than anything and potentially she might actually do well to change this more frequently at home for that reason I think we may switch to Tubigrip. 08/25/2020 upon evaluation today patient appears to be doing excellent in regard to her leg ulcer. She has been tolerating the dressing changes without complication. In general I think she appears to be completely healed in my opinion. Objective Constitutional Well-nourished and well-hydrated in no acute distress. Vitals Time Taken: 3:46 PM, Height: 60 in, Weight: 155 lbs, BMI: 30.3, Temperature: 98.3 F, Pulse: 97 bpm, Respiratory Rate: 18 breaths/min, Blood Pressure: 122/66 mmHg. Respiratory normal breathing without difficulty. Psychiatric this patient is able to make decisions and demonstrates good insight into disease process. Alert and Oriented x 3. pleasant and  cooperative. General Notes: Patient's wound bed showed signs of good granulation epithelization at this point. There does not appear to be any evidence of active infection which is great news and overall very pleased. She does have some stasis dermatitis noted but again the temperature is really not [name], [name]. ([id number]) different at this location versus the remainder of the  leg I do not believe this represents cellulitis at all to be honest. Integumentary (Hair, Skin) Wound #1 status is Healed - Epithelialized. Original cause of wound was Gradually Appeared. The date acquired was: 06/21/2020. The wound has been in treatment 5 weeks. The wound is located on the Left,Midline Lower Leg. The wound measures 0cm length x 0cm width x 0cm depth; 0cm^2 area and 0cm^3 volume. There is no tunneling or undermining noted. There is a none present amount of drainage noted. The wound margin is flat and intact. There is no granulation within the wound bed. There is no necrotic tissue within the wound bed. 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 Put garments on first thing when you wake up and remove them before bed. Moisturize legs daily after removing compression garments. 1. Would recommend currently that we go ahead and initiate treatment with a continuation of the compression therapy. She did have a prescription for a small half a fluid pill given by her primary care provider hopefully this can help some. 2. I am also going to recommend that the patient continue to use her compression stockings which I think are beneficial as far as helping to control the  edema. 3. I am also can recommend that the patient try to elevate her legs is much as she can to help with the edema control as well. We will see patient back for reevaluation in 1 week here in the clinic. If anything worsens or changes patient will contact our office for additional recommendations. Electronic Signature(s) Signed: 08/25/2020 4:09:13 PM By: Worthy Keeler PA-C Entered By: Worthy Keeler on 08/25/2020 16:09:13 [place], [name] ([id number]) -------------------------------------------------------------------------------- SuperBill Details Patient Name: [name] Date of Service: 08/25/2020 Medical Record Number: [id number] Patient Account Number: [id number] Date of Birth/Sex: [birth year] (87 y.o. F) Treating RN: Dolan Amen Primary Care Provider: Tracie Harrier Other Clinician: Referring Provider: Tracie Harrier Treating Provider/Extender: Skipper Cliche in Treatment: 5 Diagnosis Coding ICD-10 Codes Code Description [phone] Chronic venous hypertension (idiopathic) with ulcer and inflammation of left lower extremity L97.828 Non-pressure chronic ulcer of other part of left lower leg with other specified severity G90.09 Other idiopathic peripheral autonomic neuropathy Facility Procedures CPT4 Code: [id number] Description: [phone] - WOUND CARE VISIT-LEV 2 EST PT Modifier: Quantity: 1 Physician Procedures CPT4 Code Description: [id number] - WC PHYS LEVEL 3 - EST PT Modifier: Quantity: 1 CPT4 Code Description: ICD-10 Diagnosis Description I87.332 Chronic venous hypertension (idiopathic) with ulcer and inflammation of le L97.828 Non-pressure chronic ulcer of other part of left lower leg with other spec G90.09 Other idiopathic peripheral  autonomic neuropathy Modifier: ft lower extremity ified severity Quantity: Electronic Signature(s) Signed: 08/25/2020 4:09:25 PM By: Worthy Keeler PA-C Entered By: Worthy Keeler on 08/25/2020 16:09:25

## 2020-08-26 NOTE — Progress Notes (Signed)
[name], [name] ([id number]) Visit Report for 08/25/2020 Arrival Information Details Patient Name: [name], [name]. Date of Service: 08/25/2020 3:45 PM Medical Record Number: [id number] Patient Account Number: [id number] Date of Birth/Sex: 21-Sep-1932 (87 y.o. F) Treating RN: Dolan Amen Primary Care Deniese Oberry: Tracie Harrier Other Clinician: Referring Gregrey Bloyd: Tracie Harrier Treating Almedia Cordell/Extender: Skipper Cliche in Treatment: 5 Visit Information History Since Last Visit Has Dressing in Place as Prescribed: No Patient Arrived: Cane Pain Present Now: No Arrival Time: 15:45 Accompanied By: sister Transfer Assistance: None Patient Identification Verified: Yes Secondary Verification Process Completed: Yes Patient Requires Transmission-Based Precautions: No Patient Has Alerts: Yes Patient Alerts: NOT DIABETIC Electronic Signature(s) Signed: 08/25/2020 4:25:03 PM By: Dolan Amen RN Entered By: Dolan Amen on 08/25/2020 15:46:37 Erickson, Tamara Deeds ([id number]) -------------------------------------------------------------------------------- Clinic Level of Care Assessment Details Patient Name: [name] Date of Service: 08/25/2020 3:45 PM Medical Record Number: [id number] Patient Account Number: [id number] Date of Birth/Sex: [birth year] (87 y.o. F) Treating RN: Dolan Amen Primary Care Megan Hayduk: Tracie Harrier Other Clinician: Referring Tremayne Sheldon: Tracie Harrier Treating Neill Jurewicz/Extender: Skipper Cliche in Treatment: 5 Clinic Level of Care Assessment Items TOOL 4 Quantity Score X - Use when only an EandM is performed on FOLLOW-UP visit 1 0 ASSESSMENTS - Nursing Assessment / Reassessment X - Reassessment of Co-morbidities (includes updates in patient status) 1 10 X- 1 5 Reassessment of Adherence to Treatment Plan ASSESSMENTS - Wound and Skin Assessment / Reassessment []  - Simple Wound Assessment / Reassessment - one wound 0 []  - 0 Complex Wound  Assessment / Reassessment - multiple wounds X- 1 10 Dermatologic / Skin Assessment (not related to wound area) ASSESSMENTS - Focused Assessment []  - Circumferential Edema Measurements - multi extremities 0 []  - 0 Nutritional Assessment / Counseling / Intervention []  - 0 Lower Extremity Assessment (monofilament, tuning fork, pulses) []  - 0 Peripheral Arterial Disease Assessment (using hand held doppler) ASSESSMENTS - Ostomy and/or Continence Assessment and Care []  - Incontinence Assessment and Management 0 []  - 0 Ostomy Care Assessment and Management (repouching, etc.) PROCESS - Coordination of Care X - Simple Patient / Family Education for ongoing care 1 15 []  - 0 Complex (extensive) Patient / Family Education for ongoing care []  - 0 Staff obtains Programmer, systems, Records, Test Results / Process Orders []  - 0 Staff telephones HHA, Nursing Homes / Clarify orders / etc []  - 0 Routine Transfer to another Facility (non-emergent condition) []  - 0 Routine Hospital Admission (non-emergent condition) []  - 0 New Admissions / Biomedical engineer / Ordering NPWT, Apligraf, etc. []  - 0 Emergency Hospital Admission (emergent condition) X- 1 10 Simple Discharge Coordination []  - 0 Complex (extensive) Discharge Coordination PROCESS - Special Needs []  - Pediatric / Minor Patient Management 0 []  - 0 Isolation Patient Management []  - 0 Hearing / Language / Visual special needs []  - 0 Assessment of Community assistance (transportation, D/C planning, etc.) []  - 0 Additional assistance / Altered mentation []  - 0 Support Surface(s) Assessment (bed, cushion, seat, etc.) INTERVENTIONS - Wound Cleansing / Measurement Tamara, Erickson. ([id number]) []  - 0 Simple Wound Cleansing - one wound []  - 0 Complex Wound Cleansing - multiple wounds []  - 0 Wound Imaging (photographs - any number of wounds) []  - 0 Wound Tracing (instead of photographs) []  - 0 Simple Wound Measurement - one wound []  -  0 Complex Wound Measurement - multiple wounds INTERVENTIONS - Wound Dressings []  - Small Wound Dressing one or multiple wounds 0 []  - 0 Medium Wound  Dressing one or multiple wounds []  - 0 Large Wound Dressing one or multiple wounds []  - 0 Application of Medications - topical []  - 0 Application of Medications - injection INTERVENTIONS - Miscellaneous []  - External ear exam 0 []  - 0 Specimen Collection (cultures, biopsies, blood, body fluids, etc.) []  - 0 Specimen(s) / Culture(s) sent or taken to Lab for analysis []  - 0 Patient Transfer (multiple staff / Civil Service fast streamer / Similar devices) []  - 0 Simple Staple / Suture removal (25 or less) []  - 0 Complex Staple / Suture removal (26 or more) []  - 0 Hypo / Hyperglycemic Management (close monitor of Blood Glucose) []  - 0 Ankle / Brachial Index (ABI) - do not check if billed separately X- 1 5 Vital Signs Has the patient been seen at the hospital within the last three years: Yes Total Score: 55 Level Of Care: New/Established - Level 2 Electronic Signature(s) Signed: 08/25/2020 4:25:03 PM By: Dolan Amen RN Entered By: Dolan Amen on 08/25/2020 16:03:33 Erickson, Tamara Deeds ([id number]) -------------------------------------------------------------------------------- Encounter Discharge Information Details Patient Name: [name] Date of Service: 08/25/2020 3:45 PM Medical Record Number: [id number] Patient Account Number: [id number] Date of Birth/Sex: [birth year] (87 y.o. F) Treating RN: Dolan Amen Primary Care Rony Ratz: Tracie Harrier Other Clinician: Referring Elvie Palomo: Tracie Harrier Treating Tahnee Cifuentes/Extender: Skipper Cliche in Treatment: 5 Encounter Discharge Information Items Discharge Condition: Stable Ambulatory Status: Cane Discharge Destination: Home Transportation: Private Auto Accompanied By: sister Schedule Follow-up Appointment: No Clinical Summary of Care: Electronic Signature(s) Signed:  08/25/2020 4:25:03 PM By: Dolan Amen RN Entered By: Dolan Amen on 08/25/2020 16:04:22 [name] ([id number]) -------------------------------------------------------------------------------- Lower Extremity Assessment Details Patient Name: [name] Date of Service: 08/25/2020 3:45 PM Medical Record Number: [id number] Patient Account Number: [id number] Date of Birth/Sex: [birth year] (87 y.o. F) Treating RN: Dolan Amen Primary Care Lleyton Byers: Tracie Harrier Other Clinician: Referring Daxter Paule: Tracie Harrier Treating Reily Ilic/Extender: Skipper Cliche in Treatment: 5 Edema Assessment Assessed: [Left: Yes] [Right: No] Edema: [Left: Ye] [Right: s] Calf Left: Right: Point of Measurement: 30 cm From Medial Instep 34 cm Ankle Left: Right: Point of Measurement: 9 cm From Medial Instep 21.8 cm Vascular Assessment Pulses: Dorsalis Pedis Palpable: [Left:Yes] Electronic Signature(s) Signed: 08/25/2020 4:25:03 PM By: Dolan Amen RN Entered By: Dolan Amen on 08/25/2020 15:53:01 Orsini, Tamara Deeds ([id number]) -------------------------------------------------------------------------------- Multi Wound Chart Details Patient Name: [name] Date of Service: 08/25/2020 3:45 PM Medical Record Number: [id number] Patient Account Number: [id number] Date of Birth/Sex: 02/21/1933 (87 y.o. F) Treating RN: Dolan Amen Primary Care Eyob Godlewski: Tracie Harrier Other Clinician: Referring Terrion Gencarelli: Tracie Harrier Treating Jamiah Recore/Extender: Skipper Cliche in Treatment: 5 Vital Signs Height(in): 60 Pulse(bpm): 97 Weight(lbs): 155 Blood Pressure(mmHg): 122/66 Body Mass Index(BMI): 30 Temperature(F): 98.3 Respiratory Rate(breaths/min): 18 Photos: [N/A:N/A] Wound Location: Left, Midline Lower Leg N/A N/A Wounding Event: Gradually Appeared N/A N/A Primary Etiology: Venous Leg Ulcer N/A N/A Comorbid History: Lymphedema, Chronic Obstructive N/A  N/A Pulmonary Disease (COPD), Congestive Heart Failure, Coronary Artery Disease, End Stage Renal Disease, Gout, Osteoarthritis, Neuropathy, Received Chemotherapy Date Acquired: 06/21/2020 N/A N/A Weeks of Treatment: 5 N/A N/A Wound Status: Healed - Epithelialized N/A N/A Measurements L x W x D (cm) 0x0x0 N/A N/A Area (cm) : 0 N/A N/A Volume (cm) : 0 N/A N/A % Reduction in Area: 100.00% N/A N/A % Reduction in Volume: 100.00% N/A N/A Classification: Full Thickness Without Exposed N/A N/A Support Structures Exudate Amount: None Present N/A N/A Wound Margin: Flat  and Intact N/A N/A Granulation Amount: None Present (0%) N/A N/A Necrotic Amount: None Present (0%) N/A N/A Exposed Structures: Fascia: No N/A N/A Fat Layer (Subcutaneous Tissue): No Tendon: No Muscle: No Joint: No Bone: No Epithelialization: Large (67-100%) N/A N/A Treatment Notes Electronic Signature(s) Signed: 08/25/2020 4:25:03 PM By: Dolan Amen RN Entered By: Dolan Amen on 08/25/2020 16:02:48 Dipasquale, Tamara Deeds ([id number]) -------------------------------------------------------------------------------- [place] Details Patient Name: [name] Date of Service: 08/25/2020 3:45 PM Medical Record Number: [id number] Patient Account Number: [id number] Date of Birth/Sex: 28-Oct-1932 (87 y.o. F) Treating RN: Dolan Amen Primary Care Judye Lorino: Tracie Harrier Other Clinician: Referring Rachal Dvorsky: Tracie Harrier Treating Ziad Maye/Extender: Skipper Cliche in Treatment: 5 Active Inactive Electronic Signature(s) Signed: 08/25/2020 4:25:03 PM By: Dolan Amen RN Entered By: Dolan Amen on 08/25/2020 16:02:42 Masley, Tamara Deeds ([id number]) -------------------------------------------------------------------------------- Pain Assessment Details Patient Name: [name] Date of Service: 08/25/2020 3:45 PM Medical Record Number: [id number] Patient Account Number:  [id number] Date of Birth/Sex: 01/09/1933 (87 y.o. F) Treating RN: Dolan Amen Primary Care Rhett Najera: Tracie Harrier Other Clinician: Referring Jatinder Mcdonagh: Tracie Harrier Treating Tanysha Quant/Extender: Skipper Cliche in Treatment: 5 Active Problems Location of Pain Severity and Description of Pain Patient Has Paino No Site Locations Rate the pain. Current Pain Level: 0 Pain Management and Medication Current Pain Management: Electronic Signature(s) Signed: 08/25/2020 4:25:03 PM By: Dolan Amen RN Entered By: Dolan Amen on 08/25/2020 15:47:06 Heyer, Tamara Deeds ([id number]) -------------------------------------------------------------------------------- Patient/Caregiver Education Details Patient Name: [name] Date of Service: 08/25/2020 3:45 PM Medical Record Number: [id number] Patient Account Number: [id number] Date of Birth/Gender: 1932-03-30 (87 y.o. F) Treating RN: Dolan Amen Primary Care Physician: Tracie Harrier Other Clinician: Referring Physician: Tracie Harrier Treating Physician/Extender: Skipper Cliche in Treatment: 5 Education Assessment Education Provided To: Patient Education Topics Provided Notes discharge instructions Electronic Signature(s) Signed: 08/25/2020 4:25:03 PM By: Dolan Amen RN Entered By: Dolan Amen on 08/25/2020 16:04:03 Riling, Tamara Deeds ([id number]) -------------------------------------------------------------------------------- Wound Assessment Details Patient Name: [name] Date of Service: 08/25/2020 3:45 PM Medical Record Number: [id number] Patient Account Number: [id number] Date of Birth/Sex: 07-05-1932 (87 y.o. F) Treating RN: Dolan Amen Primary Care Mylisa Brunson: Tracie Harrier Other Clinician: Referring Rowen Hur: Tracie Harrier Treating Hailey Miles/Extender: Skipper Cliche in Treatment: 5 Wound Status Wound Number: 1 Primary Venous Leg Ulcer Etiology: Wound Location: Left, Midline  Lower Leg Wound Healed - Epithelialized Wounding Event: Gradually Appeared Status: Date Acquired: 06/21/2020 Comorbid Lymphedema, Chronic Obstructive Pulmonary Disease Weeks Of Treatment: 5 History: (COPD), Congestive Heart Failure, Coronary Artery Disease, Clustered Wound: No End Stage Renal Disease, Gout, Osteoarthritis, Neuropathy, Received Chemotherapy Photos Wound Measurements Length: (cm) 0 Width: (cm) 0 Depth: (cm) 0 Area: (cm) 0 Volume: (cm) 0 % Reduction in Area: 100% % Reduction in Volume: 100% Epithelialization: Large (67-100%) Tunneling: No Undermining: No Wound Description Classification: Full Thickness Without Exposed Support Structure Wound Margin: Flat and Intact Exudate Amount: None Present s Foul Odor After Cleansing: No Slough/Fibrino No Wound Bed Granulation Amount: None Present (0%) Exposed Structure Necrotic Amount: None Present (0%) Fascia Exposed: No Fat Layer (Subcutaneous Tissue) Exposed: No Tendon Exposed: No Muscle Exposed: No Joint Exposed: No Bone Exposed: No Electronic Signature(s) Signed: 08/25/2020 4:25:03 PM By: Dolan Amen RN Entered By: Dolan Amen on 08/25/2020 16:02:30 Nygard, Tamara Deeds ([id number]) -------------------------------------------------------------------------------- Vitals Details Patient Name: [name] Date of Service: 08/25/2020 3:45 PM Medical Record Number: [id number] Patient Account Number: [id number] Date of Birth/Sex: 08-24-1932 (87 y.o.  F) Treating RN: Dolan Amen Primary Care Kamar Callender: Tracie Harrier Other Clinician: Referring Axcel Horsch: Tracie Harrier Treating Safira Proffit/Extender: Skipper Cliche in Treatment: 5 Vital Signs Time Taken: 15:46 Temperature (F): 98.3 Height (in): 60 Pulse (bpm): 97 Weight (lbs): 155 Respiratory Rate (breaths/min): 18 Body Mass Index (BMI): 30.3 Blood Pressure (mmHg): 122/66 Reference Range: 80 - 120 mg / dl Electronic Signature(s) Signed:  08/25/2020 4:25:03 PM By: Dolan Amen RN Entered By: Dolan Amen on 08/25/2020 15:46:55

## 2020-10-12 ENCOUNTER — Other Ambulatory Visit: Payer: Self-pay | Admitting: Nephrology

## 2020-10-12 ENCOUNTER — Other Ambulatory Visit (HOSPITAL_COMMUNITY): Payer: Self-pay | Admitting: Nephrology

## 2020-10-12 DIAGNOSIS — D631 Anemia in chronic kidney disease: Secondary | ICD-10-CM | POA: Insufficient documentation

## 2020-10-12 DIAGNOSIS — N189 Chronic kidney disease, unspecified: Secondary | ICD-10-CM | POA: Insufficient documentation

## 2020-10-12 DIAGNOSIS — R809 Proteinuria, unspecified: Secondary | ICD-10-CM

## 2020-10-12 DIAGNOSIS — N1831 Chronic kidney disease, stage 3a: Secondary | ICD-10-CM

## 2020-10-12 DIAGNOSIS — R609 Edema, unspecified: Secondary | ICD-10-CM | POA: Insufficient documentation

## 2020-10-12 DIAGNOSIS — R82998 Other abnormal findings in urine: Secondary | ICD-10-CM

## 2020-11-07 ENCOUNTER — Other Ambulatory Visit: Payer: Self-pay

## 2020-11-07 ENCOUNTER — Ambulatory Visit
Admission: RE | Admit: 2020-11-07 | Discharge: 2020-11-07 | Disposition: A | Discharge status: Home / Self Care | Payer: Medicare Other | Source: Ambulatory Visit | Attending: Nephrology | Admitting: Nephrology

## 2020-11-07 DIAGNOSIS — R82998 Other abnormal findings in urine: Secondary | ICD-10-CM | POA: Insufficient documentation

## 2020-11-07 DIAGNOSIS — R809 Proteinuria, unspecified: Secondary | ICD-10-CM | POA: Diagnosis present

## 2020-11-07 DIAGNOSIS — N1831 Chronic kidney disease, stage 3a: Secondary | ICD-10-CM | POA: Insufficient documentation

## 2020-11-22 DIAGNOSIS — N281 Cyst of kidney, acquired: Secondary | ICD-10-CM | POA: Insufficient documentation

## 2021-01-08 ENCOUNTER — Ambulatory Visit (INDEPENDENT_AMBULATORY_CARE_PROVIDER_SITE_OTHER): Payer: Medicare Other | Admitting: Urology

## 2021-01-08 ENCOUNTER — Encounter: Payer: Self-pay | Admitting: Urology

## 2021-01-08 ENCOUNTER — Other Ambulatory Visit: Payer: Self-pay

## 2021-01-08 VITALS — BP 125/64 | HR 76 | Ht 61.0 in | Wt 158.0 lb

## 2021-01-08 DIAGNOSIS — R8281 Pyuria: Secondary | ICD-10-CM | POA: Diagnosis not present

## 2021-01-08 DIAGNOSIS — N3941 Urge incontinence: Secondary | ICD-10-CM | POA: Diagnosis not present

## 2021-01-08 MED ORDER — MIRABEGRON ER 25 MG PO TB24: 25.0000 mg | ORAL_TABLET | Freq: Every day | ORAL | 0 refills | Status: DC

## 2021-01-08 NOTE — Progress Notes (Signed)
01/08/2021 10:48 AM   [name] 1932-08-01 [id number]  Referring provider: Peggye Form, NP [place],  [place] [address]  Chief Complaint  Patient presents with   Urinary Incontinence    HPI: [name] is an 87 y.o. female referred for evaluation of urinary incontinence.  She presents today with her daughter.  ~ 2 year history of urinary incontinence which has worsened the last 12 months Started on oxybutynin IR 3 times daily 2 years ago with no real improvement in her symptoms She does have dry mouth, constipation and her daughter has noted some cognitive deficits Complains of frequency, urgency with urge incontinence.  Will change her clothing 2-3 times daily due to incontinent episodes History of recurrent UTI Denies dysuria or gross hematuria RUS 10/2020 showed no upper tract abnormalities   PMH: Past Medical History:  Diagnosis Date   Afib ([place])    Anxiety    Anxiety    Basal cell carcinoma 11/11/2017   Right inferior knee. Superficial and nodular patterns.   Bronchitis    recent   Cancer [facility])    Lymphoma    Chronic kidney disease    ckd stage 3   Congestive heart failure (CHF) (HCC)    Coronary artery disease    Status post stent placement in LAD   Dysrhythmia    GERD (gastroesophageal reflux disease)    Hyperlipemia    Hypertension    Hypothyroidism    Squamous cell carcinoma of skin 06/13/2020   L mid pretibia, biopsy only   Thyroid disease     Surgical History: Past Surgical History:  Procedure Laterality Date   ABDOMINAL HYSTERECTOMY     complete   CORONARY ANGIOPLASTY     stent   CORONARY STENT PLACEMENT     EXCISION OF ABDOMINAL WALL TUMOR Right 05/08/2018   Procedure: EXCISION OF ABDOMINAL WALL-RIGHT;  Surgeon: Herbert Pun, MD;  Location: [facility] ORS;  Service: General;  Laterality: Right;   INCISIONAL HERNIA REPAIR N/A 05/08/2018   Procedure: LAPAROSCOPIC VS. OPEN INCISIONAL HERNIA REPAIR WITH MESH;   Surgeon: Herbert Pun, MD;  Location: [facility] ORS;  Service: General;  Laterality: N/A;   THYROIDECTOMY Bilateral 11/17/2019   Procedure: TOTAL THYROIDECTOMY;  Surgeon: Margaretha Sheffield, MD;  Location: [facility] ORS;  Service: ENT;  Laterality: Bilateral;   VEIN SURGERY     Endovenous ablation of saphenous vein    Home Medications:  Allergies as of 01/08/2021       Reactions   Cefdinir Other (See Comments)   Sulfa Antibiotics Hives, Rash        Medication List        Accurate as of January 08, 2021 10:48 AM. If you have any questions, ask your nurse or doctor.          STOP taking these medications    diclofenac Sodium 1 % Gel Commonly known as: VOLTAREN Stopped by: Abbie Sons, MD   liothyronine 5 MCG tablet Commonly known as: CYTOMEL Stopped by: Abbie Sons, MD   mupirocin ointment 2 % Commonly known as: BACTROBAN Stopped by: Abbie Sons, MD       TAKE these medications    acetaminophen 325 MG tablet Commonly known as: TYLENOL Take 1 tablet (325 mg total) by mouth every 6 (six) hours as needed for mild pain, fever or headache (or Fever >/= 101).   Biotin 5 MG Tabs Take by mouth.   Calcium Carb-Cholecalciferol 600-10 MG-MCG Tabs Take by mouth.  What changed: Another medication with the same name was removed. Continue taking this medication, and follow the directions you see here. Changed by: Abbie Sons, MD   DULoxetine 20 MG capsule Commonly known as: CYMBALTA Take 20 mg by mouth daily.   EasiVent inhaler See admin instructions.   furosemide 20 MG tablet Commonly known as: LASIX Take 1 tablet (20 mg total) by mouth once daily   levothyroxine 112 MCG tablet Commonly known as: SYNTHROID TAKE 1 TABLET ONCE DAILY ON EMPTY STOMACH WITH GLASS OF WATER 30-60 MINUTES BEFORE BREAKFAST What changed: Another medication with the same name was removed. Continue taking this medication, and follow the directions you see here. Changed by: Abbie Sons, MD   lovastatin 40 MG tablet Commonly known as: MEVACOR Take 40 mg by mouth daily with supper.   oxybutynin 5 MG tablet Commonly known as: DITROPAN Take 5 mg by mouth 3 (three) times daily.   pantoprazole 40 MG tablet Commonly known as: PROTONIX Take 40 mg by mouth daily.   QUEtiapine 25 MG tablet Commonly known as: SEROQUEL Take by mouth.   rOPINIRole 2 MG tablet Commonly known as: REQUIP Take 2 mg by mouth 2 (two) times daily.   spironolactone 25 MG tablet Commonly known as: ALDACTONE Take 0.5 tablets (12.5 mg total) by mouth once daily   traMADol 50 MG tablet Commonly known as: ULTRAM Take by mouth as needed. What changed: Another medication with the same name was removed. Continue taking this medication, and follow the directions you see here. Changed by: Abbie Sons, MD   WOMENS 50+ MULTI VITAMIN/MIN PO Take by mouth.        Allergies:  Allergies  Allergen Reactions   Cefdinir Other (See Comments)   Sulfa Antibiotics Hives and Rash    Family History: Family History  Problem Relation Age of Onset   Heart Problems Mother    Heart Problems Father    Heart attack Father    Diabetes Brother    Diabetes Brother     Social History:  reports that she has never smoked. She has never used smokeless tobacco. She reports that she does not drink alcohol and does not use drugs.   Physical Exam: BP 125/64 (BP Location: Left Arm, Patient Position: Sitting, Cuff Size: Normal)   Pulse 76   Ht 5\' 1"  (1.549 m)   Wt 158 lb (71.7 kg)   BMI 29.85 kg/m   Constitutional:  Alert, No acute distress. HEENT: [place] AT, moist mucus membranes.  Trachea midline, no masses. Cardiovascular: No clubbing, cyanosis, or edema. Respiratory: Normal respiratory effort, no increased work of breathing. Neurologic: Grossly intact, no focal deficits, moving all 4 extremities. Psychiatric: Normal mood and affect.  Laboratory Data:  Urinalysis 01/08/2021: Dipstick trace  intact blood/2+ leukocytes; microscopy >30 WBC, >10 epis  Pertinent Imaging:   US RENAL  Narrative CLINICAL DATA:  Chronic kidney disease  EXAM: RENAL / URINARY TRACT ULTRASOUND COMPLETE  COMPARISON:  CT 04/28/2020  FINDINGS: Right Kidney:  Renal measurements: 9.4 x 4.3 x 4.5 cm = volume: 94.6 mL. Cortex is echogenic. No hydronephrosis. Parapelvic cyst measuring 2.1 cm  Left Kidney:  Renal measurements: 9.7 x 4.8 x 4.1 cm = volume: 100.4 mL. Cortex is echogenic. No hydronephrosis. Small cyst at the upper pole measuring 1.1 cm  Bladder:  Appears normal for degree of bladder distention.  Other:  None.  IMPRESSION: 1. Increased cortical echogenicity consistent with medical renal disease. No hydronephrosis. 2. Small bilateral renal  cysts   Electronically Signed By: Donavan Foil M.D. On: 11/08/2020 20:46   Assessment & Plan:    1.  Urge incontinence Due to side effects and cognitive deficits of anticholinergic medication in the elderly would recommend discontinuing the oxybutynin Trial Myrbetriq 25 mg daily Follow-up 1 month symptom reassessment and bladder scan for PVR Cystoscopy/pelvic exam at that visit if no symptom improvement  2.  Pyuria Urinalysis with >10 epis Urine culture ordered   Abbie Sons, [place] [address] [place], [place] [id number] [phone]

## 2021-01-08 NOTE — Patient Instructions (Signed)

## 2021-01-09 LAB — URINALYSIS, COMPLETE
Bilirubin, UA: NEGATIVE
Glucose, UA: NEGATIVE
Ketones, UA: NEGATIVE
Nitrite, UA: NEGATIVE
Protein,UA: NEGATIVE
Specific Gravity, UA: 1.01 (ref 1.005–1.030)
Urobilinogen, Ur: 0.2 mg/dL (ref 0.2–1.0)
pH, UA: 6.5 (ref 5.0–7.5)

## 2021-01-09 LAB — MICROSCOPIC EXAMINATION
Epithelial Cells (non renal): >10 /hpf — AB (ref 0–10)
WBC, UA: >30 /hpf — AB (ref 0–5)

## 2021-01-11 LAB — CULTURE, URINE COMPREHENSIVE

## 2021-01-12 ENCOUNTER — Encounter: Payer: Self-pay | Admitting: *Deleted

## 2021-01-30 ENCOUNTER — Emergency Department: Payer: Medicare Other

## 2021-01-30 ENCOUNTER — Other Ambulatory Visit: Payer: Self-pay

## 2021-01-30 ENCOUNTER — Emergency Department
Admission: EM | Admit: 2021-01-30 | Discharge: 2021-01-30 | Disposition: A | Discharge status: Home / Self Care | Payer: Medicare Other | Attending: Emergency Medicine | Admitting: Emergency Medicine

## 2021-01-30 DIAGNOSIS — R0602 Shortness of breath: Secondary | ICD-10-CM | POA: Insufficient documentation

## 2021-01-30 DIAGNOSIS — I509 Heart failure, unspecified: Secondary | ICD-10-CM | POA: Insufficient documentation

## 2021-01-30 DIAGNOSIS — L03116 Cellulitis of left lower limb: Secondary | ICD-10-CM | POA: Insufficient documentation

## 2021-01-30 DIAGNOSIS — Z79899 Other long term (current) drug therapy: Secondary | ICD-10-CM | POA: Insufficient documentation

## 2021-01-30 DIAGNOSIS — N183 Chronic kidney disease, stage 3 unspecified: Secondary | ICD-10-CM | POA: Diagnosis not present

## 2021-01-30 DIAGNOSIS — J449 Chronic obstructive pulmonary disease, unspecified: Secondary | ICD-10-CM | POA: Diagnosis not present

## 2021-01-30 DIAGNOSIS — M79605 Pain in left leg: Secondary | ICD-10-CM | POA: Diagnosis present

## 2021-01-30 DIAGNOSIS — I130 Hypertensive heart and chronic kidney disease with heart failure and stage 1 through stage 4 chronic kidney disease, or unspecified chronic kidney disease: Secondary | ICD-10-CM | POA: Diagnosis not present

## 2021-01-30 DIAGNOSIS — Z85828 Personal history of other malignant neoplasm of skin: Secondary | ICD-10-CM | POA: Diagnosis not present

## 2021-01-30 DIAGNOSIS — Z8572 Personal history of non-Hodgkin lymphomas: Secondary | ICD-10-CM | POA: Insufficient documentation

## 2021-01-30 DIAGNOSIS — E039 Hypothyroidism, unspecified: Secondary | ICD-10-CM | POA: Insufficient documentation

## 2021-01-30 LAB — BASIC METABOLIC PANEL
Anion gap: 8 (ref 5–15)
BUN: 28 mg/dL — ABNORMAL HIGH (ref 8–23)
CO2: 27 mmol/L (ref 22–32)
Calcium: 8.9 mg/dL (ref 8.9–10.3)
Chloride: 100 mmol/L (ref 98–111)
Creatinine, Ser: 1.24 mg/dL — ABNORMAL HIGH (ref 0.44–1.00)
GFR, Estimated: 42 mL/min — ABNORMAL LOW (ref >60)
Glucose, Bld: 130 mg/dL — ABNORMAL HIGH (ref 70–99)
Potassium: 4.6 mmol/L (ref 3.5–5.1)
Sodium: 135 mmol/L (ref 135–145)

## 2021-01-30 LAB — CBC
HCT: 39.2 % (ref 36.0–46.0)
Hemoglobin: 12.9 g/dL (ref 12.0–15.0)
MCH: 31.2 pg (ref 26.0–34.0)
MCHC: 32.9 g/dL (ref 30.0–36.0)
MCV: 94.9 fL (ref 80.0–100.0)
Platelets: 306 10*3/uL (ref 150–400)
RBC: 4.13 MIL/uL (ref 3.87–5.11)
RDW: 15.3 % (ref 11.5–15.5)
WBC: 7.4 10*3/uL (ref 4.0–10.5)
nRBC: 0 % (ref 0.0–0.2)

## 2021-01-30 LAB — TROPONIN I (HIGH SENSITIVITY): Troponin I (High Sensitivity): 11 ng/L (ref <18)

## 2021-01-30 MED ORDER — DOXYCYCLINE MONOHYDRATE 100 MG PO TABS: 100.0000 mg | ORAL_TABLET | Freq: Two times a day (BID) | ORAL | 0 refills | Status: AC

## 2021-01-30 MED ORDER — ALBUTEROL SULFATE HFA 108 (90 BASE) MCG/ACT IN AERS
2.0000 | INHALATION_SPRAY | RESPIRATORY_TRACT | Status: DC | PRN
  Filled 2021-01-30: qty 6.7

## 2021-01-30 NOTE — ED Provider Notes (Signed)
[facility] Emergency Department Provider Note  ____________________________________________   Event Date/Time   First MD Initiated Contact with Patient 01/30/21 2139     (approximate)  I have reviewed the triage vital signs and the nursing notes.   HISTORY  Chief Complaint Leg Pain and Shortness of Breath    HPI [name] is a 87 y.o. female with CKD, CHF on Lasix who comes in for leg swelling.  Patient reports having intermittent episodes of leg swelling and redness.  They are typically treated with doxycycline and they get better.  They stated that they went into the urgent care today and they wanted her to have an ultrasound to rule out DVT.  They report having multiple ultrasounds that have never shown DVTs.  She states that this time is only on her left leg from the knee down.  She has a little bit of pain in the calf.  She still able to bend her knee and ankle fully.  Denies any fevers.  There was concern that she also had some shortness of breath but according to daughter this is her baseline from some COPD.  They deny any change in her shortness of breath recently.            Past Medical History:  Diagnosis Date   Afib ([place])    Anxiety    Anxiety    Basal cell carcinoma 11/11/2017   Right inferior knee. Superficial and nodular patterns.   Bronchitis    recent   Cancer [facility])    Lymphoma    Chronic kidney disease    ckd stage 3   Congestive heart failure (CHF) (HCC)    Coronary artery disease    Status post stent placement in LAD   Dysrhythmia    GERD (gastroesophageal reflux disease)    Hyperlipemia    Hypertension    Hypothyroidism    Squamous cell carcinoma of skin 06/13/2020   L mid pretibia, biopsy only   Thyroid disease     Patient Active Problem List   Diagnosis Date Noted   Hypokalemia 05/17/2020   Frequent falls 05/17/2020   AMS (altered mental status) 04/28/2020   Hypothyroidism    Hypertension    Coronary  artery disease    Congestive heart failure (CHF) ([place])    Major depressive disorder, recurrent, in remission ([place]) 04/14/2020   S/P complete thyroidectomy 11/17/2019   Acute cystitis without hematuria 09/28/2019   Varicose veins of leg with pain, bilateral 08/10/2019   Swelling of limb 06/01/2019   Leg pain 03/10/2019   History of lymphoma 03/08/2019   Vascular disease 11/25/2018   Chronic obstructive pulmonary disease ([place]) 05/22/2018   Incisional hernia 05/08/2018   Gastro-esophageal reflux disease without esophagitis 07/24/2017   Left carpal tunnel syndrome 03/29/2016   Varicose veins of leg with pain, left 01/23/2016   Gross hematuria 03/28/2015   Nephrolithiasis 03/28/2015   Chronic kidney disease (CKD), stage III (moderate) ([place]) 03/23/2015   Pneumonia 02/10/2015   HLD (hyperlipidemia) 07/29/2014   Clinical depression 07/29/2014   Arteriosclerosis of coronary artery 07/29/2014   Anxiety 07/29/2014   Diverticulitis 07/23/2014   Edema leg 07/08/2014   Acute bronchitis due to infection 01/24/2014   Lumbar canal stenosis 12/13/2013   Neuritis or radiculitis due to rupture of lumbar intervertebral disc 12/13/2013   Lumbar radiculitis 12/13/2013    Past Surgical History:  Procedure Laterality Date   ABDOMINAL HYSTERECTOMY     complete   CORONARY ANGIOPLASTY  stent   CORONARY STENT PLACEMENT     EXCISION OF ABDOMINAL WALL TUMOR Right 05/08/2018   Procedure: EXCISION OF ABDOMINAL WALL-RIGHT;  Surgeon: Herbert Pun, MD;  Location: [facility] ORS;  Service: General;  Laterality: Right;   INCISIONAL HERNIA REPAIR N/A 05/08/2018   Procedure: LAPAROSCOPIC VS. OPEN INCISIONAL HERNIA REPAIR WITH MESH;  Surgeon: Herbert Pun, MD;  Location: [facility] ORS;  Service: General;  Laterality: N/A;   THYROIDECTOMY Bilateral 11/17/2019   Procedure: TOTAL THYROIDECTOMY;  Surgeon: Margaretha Sheffield, MD;  Location: [facility] ORS;  Service: ENT;  Laterality: Bilateral;   VEIN SURGERY      Endovenous ablation of saphenous vein    Prior to Admission medications   Medication Sig Start Date End Date Taking? Authorizing Provider  acetaminophen (TYLENOL) 325 MG tablet Take 1 tablet (325 mg total) by mouth every 6 (six) hours as needed for mild pain, fever or headache (or Fever >/= 101). 05/19/20   Regalado, Belkys A, MD  Biotin 5 MG TABS Take by mouth.    [provider]  Calcium Carb-Cholecalciferol 600-10 MG-MCG TABS Take by mouth.    [provider]  DULoxetine (CYMBALTA) 20 MG capsule Take 20 mg by mouth daily.  04/14/19   [provider]  furosemide (LASIX) 20 MG tablet Take 1 tablet (20 mg total) by mouth once daily 08/18/20 08/18/21  [provider]  levothyroxine (SYNTHROID) 112 MCG tablet TAKE 1 TABLET ONCE DAILY ON EMPTY STOMACH WITH GLASS OF WATER 30-60 MINUTES BEFORE BREAKFAST 08/18/20   [provider]  lovastatin (MEVACOR) 40 MG tablet Take 40 mg by mouth daily with supper.  03/01/15   [provider]  mirabegron ER (MYRBETRIQ) 25 MG TB24 tablet Take 1 tablet (25 mg total) by mouth daily. 01/08/21   Stoioff, Ronda Fairly, MD  Multiple Vitamins-Minerals (WOMENS 50+ MULTI VITAMIN/MIN PO) Take by mouth.    [provider]  pantoprazole (PROTONIX) 40 MG tablet Take 40 mg by mouth daily. 04/14/19   [provider]  QUEtiapine (SEROQUEL) 25 MG tablet Take by mouth. 09/11/20 04/18/21  [provider]  rOPINIRole (REQUIP) 2 MG tablet Take 2 mg by mouth 2 (two) times daily.    [provider]  Spacer/Aero-Holding Chambers (EASIVENT) inhaler See admin instructions. 12/26/20 12/26/21  [provider]  spironolactone (ALDACTONE) 25 MG tablet Take 0.5 tablets (12.5 mg total) by mouth once daily 09/22/20 09/22/21  [provider]  traMADol (ULTRAM) 50 MG tablet Take by mouth as needed.    [provider]    Allergies Cefdinir and Sulfa antibiotics  Family History  Problem Relation Age  of Onset   Heart Problems Mother    Heart Problems Father    Heart attack Father    Diabetes Brother    Diabetes Brother     Social History Social History   Tobacco Use   Smoking status: Never   Smokeless tobacco: Never  Vaping Use   Vaping Use: Never used  Substance Use Topics   Alcohol use: No   Drug use: No      Review of Systems Constitutional: No fever/chills Eyes: No visual changes. ENT: No sore throat. Cardiovascular: Denies chest pain. Respiratory: Shortness of breath Gastrointestinal: No abdominal pain.  No nausea, no vomiting.  No diarrhea.  No constipation. Genitourinary: Negative for dysuria. Musculoskeletal: Negative for back pain.  Leg pain Skin: Negative for rash. Neurological: Negative for headaches, focal weakness or numbness. All other ROS negative ____________________________________________   PHYSICAL EXAM:  VITAL  SIGNS: ED Triage Vitals  Enc Vitals Group     BP 01/30/21 1928 (!) 142/94     Pulse Rate 01/30/21 1928 85     Resp 01/30/21 1928 18     Temp 01/30/21 1928 98.4 F (36.9 C)     Temp Source 01/30/21 1928 Oral     SpO2 01/30/21 1928 98 %     Weight --      Height --      Head Circumference --      Peak Flow --      Pain Score 01/30/21 1852 8     Pain Loc --      Pain Edu? --      Excl. in [place]? --     Constitutional: Alert and oriented. Well appearing and in no acute distress. Eyes: Conjunctivae are normal. EOMI. Head: Atraumatic. Nose: No congestion/rhinnorhea. Mouth/Throat: Mucous membranes are moist.   Neck: No stridor. Trachea Midline. FROM Cardiovascular: Normal rate, regular rhythm. Grossly normal heart sounds.  Good peripheral circulation. Respiratory: Normal respiratory effort.  No retractions. Lungs CTAB. Gastrointestinal: Soft and nontender. No distention. No abdominal bruits.  Musculoskeletal: Slight redness noted to the shin of the left leg.  A little bit of swelling noted.  Able to fully range all of her  joints.  No joint effusions. 2+ distal pulse  Neurologic:  Normal speech and language. No gross focal neurologic deficits are appreciated.  Skin:  Skin is warm, dry and intact. No rash noted. Psychiatric: Mood and affect are normal. Speech and behavior are normal. GU: Deferred   ____________________________________________   LABS (all labs ordered are listed, but only abnormal results are displayed)  Labs Reviewed  BASIC METABOLIC PANEL - Abnormal; Notable for the following components:      Result Value   Glucose, Bld 130 (*)    BUN 28 (*)    Creatinine, Ser 1.24 (*)    GFR, Estimated 42 (*)    All other components within normal limits  CBC  TROPONIN I (HIGH SENSITIVITY)  TROPONIN I (HIGH SENSITIVITY)   ____________________________________________   ED ECG REPORT I, Vanessa Paradise, the attending physician, personally viewed and interpreted this ECG.  Normal sinus rhythm 92, no ST elevation, no T wave inversions, normal intervals ____________________________________________  RADIOLOGY Robert Bellow, personally viewed and evaluated these images (plain radiographs) as part of my medical decision making, as well as reviewing the written report by the radiologist.  ED MD interpretation: No pneumonia  Official radiology report(s): DG Chest 2 View  Result Date: 01/30/2021 CLINICAL DATA:  Shortness of breath EXAM: CHEST - 2 VIEW COMPARISON:  Chest x-ray 05/17/2020, CT chest 04/28/2020 FINDINGS: The heart and mediastinal contours are unchanged. Aortic calcification. No focal consolidation. Coarsened interstitial markings with no overt pulmonary edema. No pleural effusion. No pneumothorax. No acute osseous abnormality. IMPRESSION: No active cardiopulmonary disease. Electronically Signed   By: Iven Finn M.D.   On: 01/30/2021 19:19   US Venous Img Lower Unilateral Left  Result Date: 01/30/2021 CLINICAL DATA:  Left leg pain.  Shortness of breath. EXAM: Left LOWER EXTREMITY VENOUS  DOPPLER ULTRASOUND TECHNIQUE: Gray-scale sonography with compression, as well as color and duplex ultrasound, were performed to evaluate the deep venous system(s) from the level of the common femoral vein through the popliteal and proximal calf veins. COMPARISON:  None. FINDINGS: VENOUS Normal compressibility of the common femoral, superficial femoral, and popliteal veins, as well as the visualized calf veins. Visualized portions of profunda  femoral vein and great saphenous vein unremarkable. No filling defects to suggest DVT on grayscale or color Doppler imaging. Doppler waveforms show normal direction of venous flow, normal respiratory plasticity and response to augmentation. Limited views of the contralateral common femoral vein are unremarkable. OTHER None. Limitations: none IMPRESSION: Negative. Electronically Signed   By: Anner Crete M.D.   On: 01/30/2021 20:57    ____________________________________________   PROCEDURES  Procedure(s) performed (including Critical Care):  Procedures   ____________________________________________   INITIAL IMPRESSION / ASSESSMENT AND PLAN / ED COURSE  [name] was evaluated in Emergency Department on 01/30/2021 for the symptoms described in the history of present illness. She was evaluated in the context of the global COVID-19 pandemic, which necessitated consideration that the patient might be at risk for infection with the SARS-CoV-2 virus that causes COVID-19. Institutional protocols and algorithms that pertain to the evaluation of patients at risk for COVID-19 are in a state of rapid change based on information released by regulatory bodies including the CDC and federal and state organizations. These policies and algorithms were followed during the patient's care in the ED.     Patient comes in with leg swelling with a plan to rule out DVT.  Good distal pulse unlikely arterial issue DVT ultrasound was negative.  Concern for cellulitis and she  is had this intermittently previously.  No evidence of septic joint.  Will start on Doxy.  I asked them about the shortness of breath but they state that this is more of a chronic issue and declined repeat troponin, COVID testing or further work-up for this.  They do state that is not really the concern today.  We discussed going up on her Lasix but they state that they have only been giving a half a pill secondary to her urinating a ton from just that.  They state that this is usually just from the cellulitis and the doxycycline alone helps it.  Given the swelling is not bilateral I suspect it is more likely related to cellulitis but explained that if this was not making it better to consider taking the full dose of Lasix.  They expressed understanding felt comfortable with this plan      ____________________________________________   FINAL CLINICAL IMPRESSION(S) / ED DIAGNOSES   Final diagnoses:  Cellulitis of left lower extremity      MEDICATIONS GIVEN DURING THIS VISIT:  Medications  albuterol (VENTOLIN HFA) 108 (90 Base) MCG/ACT inhaler 2 puff (has no administration in time range)     ED Discharge Orders     None        Note:  This document was prepared using Dragon voice recognition software and may include unintentional dictation errors.    Vanessa Kiefer, MD 01/30/21 [phone]

## 2021-01-30 NOTE — ED Triage Notes (Signed)
Pt comes with c/o SOB and leg pain. Pt sent here to rule out DVT. Pt states pain and tingling. Pt states this started two days ago.

## 2021-01-30 NOTE — Discharge Instructions (Signed)
Start taking the antibiotics.  Return for fevers or any other concerns

## 2021-01-30 NOTE — ED Notes (Signed)
Dr. Jari Pigg at the bedside

## 2021-02-05 ENCOUNTER — Ambulatory Visit (INDEPENDENT_AMBULATORY_CARE_PROVIDER_SITE_OTHER): Payer: Medicare Other | Admitting: Urology

## 2021-02-05 ENCOUNTER — Encounter: Payer: Self-pay | Admitting: Urology

## 2021-02-05 ENCOUNTER — Other Ambulatory Visit: Payer: Self-pay

## 2021-02-05 VITALS — BP 134/78 | HR 98 | Ht 61.0 in | Wt 161.5 lb

## 2021-02-05 DIAGNOSIS — N3941 Urge incontinence: Secondary | ICD-10-CM | POA: Diagnosis not present

## 2021-02-05 LAB — URINALYSIS, COMPLETE
Bilirubin, UA: NEGATIVE
Glucose, UA: NEGATIVE
Ketones, UA: NEGATIVE
Nitrite, UA: NEGATIVE
Protein,UA: NEGATIVE
Specific Gravity, UA: 1.01 (ref 1.005–1.030)
Urobilinogen, Ur: 0.2 mg/dL (ref 0.2–1.0)
pH, UA: 6.5 (ref 5.0–7.5)

## 2021-02-05 LAB — BLADDER SCAN AMB NON-IMAGING

## 2021-02-05 LAB — MICROSCOPIC EXAMINATION
Bacteria, UA: NONE SEEN
RBC, Urine: NONE SEEN /hpf (ref 0–2)

## 2021-02-05 MED ORDER — LIDOCAINE HCL URETHRAL/MUCOSAL 2 % EX GEL
1.0000 | Freq: Once | CUTANEOUS | Status: AC
Start: 2021-02-05 — End: 2021-02-05
  Administered 2021-02-05: 1 via URETHRAL

## 2021-02-05 NOTE — Progress Notes (Signed)
   02/05/21  CC:  Chief Complaint  Patient presents with   Cysto    HPI: Seen 01/08/2021 for urinary incontinence.  Had been on oxybutynin IR which was discontinued and given a trial of Myrbetriq 25 mg daily which has not improved her urinary incontinence  Refer to rooming tab for vital signs NED. A&Ox3.   No respiratory distress   Abd soft, NT, ND Atrophic external genitalia with patent urethral meatus No cystocele or rectocele  Cystoscopy Procedure Note  Patient identification was confirmed, informed consent was obtained, and patient was prepped using Betadine solution.  Lidocaine jelly was administered per urethral meatus.    Procedure: - Flexible cystoscope introduced, without any difficulty.   - Thorough search of the bladder revealed:    normal urethral meatus    normal urothelium    no stones    no ulcers     no tumors    no urethral polyps    no trabeculation  - Ureteral orifices were normal in position and appearance.  Post-Procedure: - Patient tolerated the procedure well  Assessment/ Plan: No mucosal abnormalities on cystoscopy Has failed anticholinergic and Myrbetriq Trial Gemtesa daily PA follow-up 1 month.  If no improvement consider PTNS.    Abbie Sons, MD

## 2021-02-05 NOTE — Patient Instructions (Signed)

## 2021-02-07 ENCOUNTER — Ambulatory Visit: Payer: Medicare Other | Admitting: Urology

## 2021-03-08 ENCOUNTER — Ambulatory Visit: Payer: Medicare Other | Admitting: Urology

## 2021-03-09 ENCOUNTER — Telehealth: Payer: Self-pay | Admitting: Urology

## 2021-03-09 MED ORDER — GEMTESA 75 MG PO TABS: 75.0000 mg | ORAL_TABLET | Freq: Every day | ORAL | 3 refills | Status: DC

## 2021-03-09 NOTE — Telephone Encounter (Signed)
Patient called requesting samples of Gemtesa and I explained that we did not have any right now and that we could call some in to her pharmacy. They asked if we could do that.  Thanks, Sharyn Lull

## 2021-03-09 NOTE — Telephone Encounter (Signed)
Patient's daughter notified Logan Bores was sent to her pharmacy. I informed her that the medication may need a prior authorization before she can pick it up. She voiced understanding.

## 2021-03-15 ENCOUNTER — Ambulatory Visit (INDEPENDENT_AMBULATORY_CARE_PROVIDER_SITE_OTHER): Payer: Medicare Other | Admitting: Vascular Surgery

## 2021-03-15 ENCOUNTER — Other Ambulatory Visit: Payer: Self-pay

## 2021-03-15 ENCOUNTER — Encounter (INDEPENDENT_AMBULATORY_CARE_PROVIDER_SITE_OTHER): Payer: Self-pay | Admitting: Vascular Surgery

## 2021-03-15 VITALS — BP 144/84 | HR 91 | Resp 16 | Wt 162.2 lb

## 2021-03-15 DIAGNOSIS — I2510 Atherosclerotic heart disease of native coronary artery without angina pectoris: Secondary | ICD-10-CM

## 2021-03-15 DIAGNOSIS — I872 Venous insufficiency (chronic) (peripheral): Secondary | ICD-10-CM

## 2021-03-15 DIAGNOSIS — I890 Lymphedema, not elsewhere classified: Secondary | ICD-10-CM

## 2021-03-15 DIAGNOSIS — E785 Hyperlipidemia, unspecified: Secondary | ICD-10-CM

## 2021-03-15 DIAGNOSIS — M47816 Spondylosis without myelopathy or radiculopathy, lumbar region: Secondary | ICD-10-CM | POA: Diagnosis not present

## 2021-03-15 DIAGNOSIS — I10 Essential (primary) hypertension: Secondary | ICD-10-CM

## 2021-03-16 ENCOUNTER — Ambulatory Visit: Payer: Medicare Other | Admitting: Physician Assistant

## 2021-03-18 ENCOUNTER — Encounter (INDEPENDENT_AMBULATORY_CARE_PROVIDER_SITE_OTHER): Payer: Self-pay | Admitting: Vascular Surgery

## 2021-03-18 DIAGNOSIS — M47816 Spondylosis without myelopathy or radiculopathy, lumbar region: Secondary | ICD-10-CM | POA: Insufficient documentation

## 2021-03-18 DIAGNOSIS — I872 Venous insufficiency (chronic) (peripheral): Secondary | ICD-10-CM | POA: Insufficient documentation

## 2021-03-18 DIAGNOSIS — I890 Lymphedema, not elsewhere classified: Secondary | ICD-10-CM | POA: Insufficient documentation

## 2021-03-18 NOTE — Progress Notes (Addendum)
MRN : [id number]  [name] is a 88 y.o. (12/30/1932) female who presents with chief complaint of painful swelling.  History of Present Illness:  Patient is seen for evaluation of leg pain and leg swelling.  She describes to different types of pain.  One is an aching pain associated with swelling in her ankles and calf area the other is a shooting pain that radiates down the back of her leg.  The patient first noticed the swelling remotely. The swelling is associated with pain and discoloration. The pain and swelling worsens with prolonged dependency and improves with elevation. The pain is unrelated to activity, in fact she notes that the shooting pain occurs when she is laying down.  It radiates down the posterior aspect of her legs and the left leg seems to be a bit more affected than the right.  The patient notes that in the morning the legs are significantly improved but they steadily worsened throughout the course of the day. The patient also notes a steady worsening of the discoloration in the ankle and shin area.   The patient denies claudication symptoms.  The patient denies symptoms consistent with rest pain.  The patient does have a history of DJD and LS spine disease and has been told she has sciatica.  The patient has no had any past angiography, interventions or vascular surgery.  Elevation makes the leg symptoms better, dependency makes them much worse. There is no history of ulcerations. The patient denies any recent changes in medications.  The patient has not been wearing graduated compression.  The patient denies a history of DVT or PE. There is no prior history of phlebitis. There is no history of primary lymphedema.  No history of malignancies. No history of trauma or groin or pelvic surgery. There is no history of radiation treatment to the groin or pelvis  The patient denies amaurosis fugax or recent TIA symptoms. There are no recent neurological changes  noted. The patient denies recent episodes of angina or shortness of breath   Recent venous duplex was negative for DVT   Current Meds  Medication Sig   acetaminophen (TYLENOL) 325 MG tablet Take 1 tablet (325 mg total) by mouth every 6 (six) hours as needed for mild pain, fever or headache (or Fever >/= 101).   albuterol (VENTOLIN HFA) 108 (90 Base) MCG/ACT inhaler SMARTSIG:2 Inhalation Via Inhaler Every 6 Hours PRN   Biotin 5 MG TABS Take by mouth.   Calcium Carb-Cholecalciferol 600-10 MG-MCG TABS Take by mouth.   DULoxetine (CYMBALTA) 20 MG capsule Take 20 mg by mouth daily.    furosemide (LASIX) 20 MG tablet Take 1 tablet (20 mg total) by mouth once daily   levothyroxine (SYNTHROID) 112 MCG tablet TAKE 1 TABLET ONCE DAILY ON EMPTY STOMACH WITH GLASS OF WATER 30-60 MINUTES BEFORE BREAKFAST   levothyroxine (SYNTHROID) 125 MCG tablet Take by mouth.   mirabegron ER (MYRBETRIQ) 25 MG TB24 tablet Take 1 tablet (25 mg total) by mouth daily.   Multiple Vitamins-Minerals (WOMENS 50+ MULTI VITAMIN/MIN PO) Take by mouth.   pantoprazole (PROTONIX) 40 MG tablet Take 40 mg by mouth daily.   QUEtiapine (SEROQUEL) 25 MG tablet Take by mouth.   rOPINIRole (REQUIP) 2 MG tablet Take 2 mg by mouth 2 (two) times daily.   Spacer/Aero-Holding Chambers (EASIVENT) inhaler See admin instructions.   spironolactone (ALDACTONE) 25 MG tablet Take 0.5 tablets (12.5 mg total) by mouth once daily   traMADol (ULTRAM) 50 MG  tablet Take by mouth as needed.   traMADol (ULTRAM) 50 MG tablet Take by mouth.   Vibegron (GEMTESA) 75 MG TABS Take 75 mg by mouth daily.    Past Medical History:  Diagnosis Date   Afib ([place])    Anxiety    Anxiety    Basal cell carcinoma 11/11/2017   Right inferior knee. Superficial and nodular patterns.   Bronchitis    recent   Cancer [facility])    Lymphoma    Chronic kidney disease    ckd stage 3   Congestive heart failure (CHF) (HCC)    Coronary artery disease    Status post stent  placement in LAD   Dysrhythmia    GERD (gastroesophageal reflux disease)    Hyperlipemia    Hypertension    Hypothyroidism    Squamous cell carcinoma of skin 06/13/2020   L mid pretibia, biopsy only   Thyroid disease     Past Surgical History:  Procedure Laterality Date   ABDOMINAL HYSTERECTOMY     complete   CORONARY ANGIOPLASTY     stent   CORONARY STENT PLACEMENT     EXCISION OF ABDOMINAL WALL TUMOR Right 05/08/2018   Procedure: EXCISION OF ABDOMINAL WALL-RIGHT;  Surgeon: Herbert Pun, MD;  Location: [facility] ORS;  Service: General;  Laterality: Right;   INCISIONAL HERNIA REPAIR N/A 05/08/2018   Procedure: LAPAROSCOPIC VS. OPEN INCISIONAL HERNIA REPAIR WITH MESH;  Surgeon: Herbert Pun, MD;  Location: [facility] ORS;  Service: General;  Laterality: N/A;   THYROIDECTOMY Bilateral 11/17/2019   Procedure: TOTAL THYROIDECTOMY;  Surgeon: Margaretha Sheffield, MD;  Location: [facility] ORS;  Service: ENT;  Laterality: Bilateral;   VEIN SURGERY     Endovenous ablation of saphenous vein    Social History Social History   Tobacco Use   Smoking status: Never   Smokeless tobacco: Never  Vaping Use   Vaping Use: Never used  Substance Use Topics   Alcohol use: No   Drug use: No    Family History Family History  Problem Relation Age of Onset   Heart Problems Mother    Heart Problems Father    Heart attack Father    Diabetes Brother    Diabetes Brother     Allergies  Allergen Reactions   Cefdinir Other (See Comments)   Sulfa Antibiotics Hives and Rash     REVIEW OF SYSTEMS (Negative unless checked)  Constitutional: [] Weight loss  [] Fever  [] Chills Cardiac: [] Chest pain   [] Chest pressure   [] Palpitations   [] Shortness of breath when laying flat   [] Shortness of breath with exertion. Vascular:  [] Pain in legs with walking   [] Pain in legs at rest  [] History of DVT   [] Phlebitis   [x] Swelling in legs   [] Varicose veins   [] Non-healing ulcers Pulmonary:   [] Uses home oxygen    [] Productive cough   [] Hemoptysis   [] Wheeze  [] COPD   [] Asthma Neurologic:  [] Dizziness   [] Seizures   [] History of stroke   [] History of TIA  [] Aphasia   [] Vissual changes   [] Weakness or numbness in arm   [] Weakness or numbness in leg Musculoskeletal:   [] Joint swelling   [] Joint pain   [] Low back pain Hematologic:  [] Easy bruising  [] Easy bleeding   [] Hypercoagulable state   [] Anemic Gastrointestinal:  [] Diarrhea   [] Vomiting  [x] Gastroesophageal reflux/heartburn   [] Difficulty swallowing. Genitourinary:  [] Chronic kidney disease   [] Difficult urination  [] Frequent urination   [] Blood in urine Skin:  [] Rashes   [] Ulcers  Psychological:  [] History of anxiety   []  History of major depression.  Physical Examination  Vitals:   03/15/21 1627  BP: (!) 144/84  Pulse: 91  Resp: 16  Weight: 162 lb 3.2 oz (73.6 kg)   Body mass index is 30.65 kg/m. Gen: WD/WN, NAD Head: [place]/AT, No temporalis wasting.  Ear/Nose/Throat: Hearing grossly intact, nares w/o erythema or drainage, pinna without lesions Eyes: PER, EOMI, sclera nonicteric.  Neck: Supple, no gross masses.  No JVD.  Pulmonary:  Good air movement, no audible wheezing, no use of accessory muscles.  Cardiac: RRR, precordium not hyperdynamic. Vascular:  scattered varicosities present bilaterally.  Severe venous stasis changes to the legs bilaterally.  4+ soft pitting edema  Vessel Right Left  Radial Palpable Palpable  Gastrointestinal: soft, non-distended. No guarding/no peritoneal signs.  Musculoskeletal: M/S 5/5 throughout.  No deformity.  Neurologic: CN 2-12 intact. Pain and light touch intact in extremities.  Symmetrical.  Speech is fluent. Motor exam as listed above. Psychiatric: Judgment intact, Mood & affect appropriate for pt's clinical situation. Dermatologic: Severe venous rashes no ulcers noted.  No changes consistent with cellulitis. Lymph : + lichenification skin changes of chronic lymphedema.  CBC Lab Results  Component  Value Date   WBC 7.4 01/30/2021   HGB 12.9 01/30/2021   HCT 39.2 01/30/2021   MCV 94.9 01/30/2021   PLT 306 01/30/2021    BMET    Component Value Date/Time   NA 135 01/30/2021 1929   NA 139 01/30/2015 0932   NA 138 06/21/2014 0503   K 4.6 01/30/2021 1929   K 3.9 06/21/2014 0503   CL 100 01/30/2021 1929   CL 107 06/21/2014 0503   CO2 27 01/30/2021 1929   CO2 27 06/21/2014 0503   GLUCOSE 130 (H) 01/30/2021 1929   GLUCOSE 100 (H) 06/21/2014 0503   BUN 28 (H) 01/30/2021 1929   BUN 17 01/30/2015 0932   BUN 21 (H) 06/21/2014 0503   CREATININE 1.24 (H) 01/30/2021 1929   CREATININE 1.22 (H) 06/21/2014 0503   CALCIUM 8.9 01/30/2021 1929   CALCIUM 8.6 (L) 06/21/2014 0503   GFRNONAA 42 (L) 01/30/2021 1929   GFRNONAA 42 (L) 06/21/2014 0503   GFRAA 43 (L) 11/05/2019 1003   GFRAA 49 (L) 06/21/2014 0503   CrCl cannot be calculated (Patient's most recent lab result is older than the maximum 21 days allowed.).  COAG Lab Results  Component Value Date   INR 1.1 09/21/2019    Radiology No results found.   Assessment/Plan 1. Lymphedema Recommend:  No surgery or intervention at this point in time.    I have reviewed my previous discussion with the patient regarding swelling and why it causes symptoms.  Patient will continue wearing graduated compression stockings class 1 (20-30 mmHg) on a daily basis. The patient will  beginning wearing the stockings first thing in the morning and removing them in the evening. The patient is instructed specifically not to sleep in the stockings.    In addition, behavioral modification including several periods of elevation of the lower extremities during the day will be continued.  This was reviewed with the patient during the initial visit.  The patient will also continue routine exercise, especially walking on a daily basis as was discussed during the initial visit.    Despite conservative treatments including graduated compression therapy class  1 and behavioral modification including exercise and elevation the patient  has not obtained adequate control of the lymphedema.  The patient still has stage  3 lymphedema and therefore, I believe that a lymph pump should be added to improve the control of the patient's lymphedema.  Additionally, a lymph pump is warranted because it will reduce the risk of cellulitis and ulceration in the future.  Patient should follow-up in six months    2. Chronic venous insufficiency Recommend:  No surgery or intervention at this point in time.    I have reviewed my previous discussion with the patient regarding swelling and why it causes symptoms.  Patient will continue wearing graduated compression stockings class 1 (20-30 mmHg) on a daily basis. The patient will  beginning wearing the stockings first thing in the morning and removing them in the evening. The patient is instructed specifically not to sleep in the stockings.    In addition, behavioral modification including several periods of elevation of the lower extremities during the day will be continued.  This was reviewed with the patient during the initial visit.  The patient will also continue routine exercise, especially walking on a daily basis as was discussed during the initial visit.    Despite conservative treatments including graduated compression therapy class 1 and behavioral modification including exercise and elevation the patient  has not obtained adequate control of the lymphedema.  The patient still has stage 3 lymphedema and therefore, I believe that a lymph pump should be added to improve the control of the patient's lymphedema.  Additionally, a lymph pump is warranted because it will reduce the risk of cellulitis and ulceration in the future.  Patient should follow-up in six months    3. Arteriosclerosis of coronary artery Continue cardiac and antihypertensive medications as already ordered and reviewed, no changes at this  time.  Continue statin as ordered and reviewed, no changes at this time  Nitrates PRN for chest pain   4. Primary hypertension Continue antihypertensive medications as already ordered, these medications have been reviewed and there are no changes at this time.   5. Hyperlipidemia, unspecified hyperlipidemia type Continue statin as ordered and reviewed, no changes at this time   6. Arthritis of lumbar spine I will ask the spine service to evaluate.  - Ambulatory referral to Neurosurgery    Hortencia Pilar, MD  03/18/2021 11:44 AM

## 2021-03-18 NOTE — Addendum Note (Signed)
Addended by: Katha Cabal on: 03/18/2021 12:44 PM   Modules accepted: Orders

## 2021-03-26 NOTE — Progress Notes (Addendum)
03/27/2021 3:57 PM   [name] [birth year] [id number]  Referring provider: Tracie Harrier, MD [address] [facility] [place],  [place] [address]  Chief Complaint  Patient presents with   Urinary Incontinence   Urological history: 1. High risk hematuria -non-smoker -contrast CT 2022 - NED -cysto 2017- NED -RUS 2022 -NED -cysto 2022 - NED -no reports of gross heme  2. Urge incontinence -Contributing factors of age, vaginal atrophy, HTN, CHF, COPD, anxiety, pelvic surgery and depression -failed oxybutynin IR TID - no improvement -failed Myrbetriq 25 mg daily -PVR 34 mL  HPI: [name] is a 88 y.o. female who presents today for one month follow up after a trial of Gemtesa.  She is experiencing 8 or more daytime urinations, 3 or more nighttime urinations with a severe urge to urinate.  She leaks urine with both stress and urge.  She is having 3 or more daytime leakage episodes.  She wears 7 absorbent pads daily.  And she does engage in toilet mapping.  PVR 34 mL    She feels the [place] has allowed her to have more time to get to the bathroom because a less accidents, but she still has urinary frequency and nocturia.  Patient denies any modifying or aggravating factors.  Patient denies any gross hematuria, dysuria or suprapubic/flank pain.  Patient denies any fevers, chills, nausea or vomiting.    She is also been experiencing left-sided lower back pain that radiates into her left buttocks and hip for the last several months.  It is becoming progressively worse and interfering with walking.  Reviewing her chart, it is noted back in March 2022 CT scan demonstrated prominent spondylosis at L2-L3.   PMH: Past Medical History:  Diagnosis Date   Afib ([place])    Anxiety    Anxiety    Basal cell carcinoma 11/11/2017   Right inferior knee. Superficial and nodular patterns.   Bronchitis    recent   Cancer [facility])    Lymphoma    Chronic kidney  disease    ckd stage 3   Congestive heart failure (CHF) (HCC)    Coronary artery disease    Status post stent placement in LAD   Dysrhythmia    GERD (gastroesophageal reflux disease)    Hyperlipemia    Hypertension    Hypothyroidism    Squamous cell carcinoma of skin 06/13/2020   L mid pretibia, biopsy only   Thyroid disease     Surgical History: Past Surgical History:  Procedure Laterality Date   ABDOMINAL HYSTERECTOMY     complete   CORONARY ANGIOPLASTY     stent   CORONARY STENT PLACEMENT     EXCISION OF ABDOMINAL WALL TUMOR Right 05/08/2018   Procedure: EXCISION OF ABDOMINAL WALL-RIGHT;  Surgeon: Herbert Pun, MD;  Location: [facility] ORS;  Service: General;  Laterality: Right;   INCISIONAL HERNIA REPAIR N/A 05/08/2018   Procedure: LAPAROSCOPIC VS. OPEN INCISIONAL HERNIA REPAIR WITH MESH;  Surgeon: Herbert Pun, MD;  Location: [facility] ORS;  Service: General;  Laterality: N/A;   THYROIDECTOMY Bilateral 11/17/2019   Procedure: TOTAL THYROIDECTOMY;  Surgeon: Margaretha Sheffield, MD;  Location: [facility] ORS;  Service: ENT;  Laterality: Bilateral;   VEIN SURGERY     Endovenous ablation of saphenous vein    Home Medications:  Allergies as of 03/27/2021       Reactions   Cefdinir Other (See Comments)   Sulfa Antibiotics Hives, Rash        Medication  List        Accurate as of March 27, 2021  3:57 PM. If you have any questions, ask your nurse or doctor.          acetaminophen 325 MG tablet Commonly known as: TYLENOL Take 1 tablet (325 mg total) by mouth every 6 (six) hours as needed for mild pain, fever or headache (or Fever >/= 101).   albuterol 108 (90 Base) MCG/ACT inhaler Commonly known as: VENTOLIN HFA SMARTSIG:2 Inhalation Via Inhaler Every 6 Hours PRN   Biotin 5 MG Tabs Take by mouth.   Calcium Carb-Cholecalciferol 600-10 MG-MCG Tabs Take by mouth.   DULoxetine 20 MG capsule Commonly known as: CYMBALTA Take 20 mg by mouth daily.   EasiVent  inhaler See admin instructions.   furosemide 20 MG tablet Commonly known as: LASIX Take 1 tablet (20 mg total) by mouth once daily   Gemtesa 75 MG Tabs Generic drug: Vibegron Take 75 mg by mouth daily.   levothyroxine 112 MCG tablet Commonly known as: SYNTHROID TAKE 1 TABLET ONCE DAILY ON EMPTY STOMACH WITH GLASS OF WATER 30-60 MINUTES BEFORE BREAKFAST   levothyroxine 125 MCG tablet Commonly known as: SYNTHROID Take by mouth.   mirabegron ER 25 MG Tb24 tablet Commonly known as: MYRBETRIQ Take 1 tablet (25 mg total) by mouth daily.   pantoprazole 40 MG tablet Commonly known as: PROTONIX Take 40 mg by mouth daily.   QUEtiapine 25 MG tablet Commonly known as: SEROQUEL Take by mouth.   rOPINIRole 2 MG tablet Commonly known as: REQUIP Take 2 mg by mouth 2 (two) times daily.   spironolactone 25 MG tablet Commonly known as: ALDACTONE Take 0.5 tablets (12.5 mg total) by mouth once daily   traMADol 50 MG tablet Commonly known as: ULTRAM Take by mouth as needed.   WOMENS 50+ MULTI VITAMIN/MIN PO Take by mouth.        Allergies:  Allergies  Allergen Reactions   Cefdinir Other (See Comments)   Sulfa Antibiotics Hives and Rash    Family History: Family History  Problem Relation Age of Onset   Heart Problems Mother    Heart Problems Father    Heart attack Father    Diabetes Brother    Diabetes Brother     Social History:  reports that she has never smoked. She has never used smokeless tobacco. She reports that she does not drink alcohol and does not use drugs.  ROS: Pertinent ROS in HPI  Physical Exam: BP 125/78    Pulse 91    Ht 5\' 1"  (1.549 m)    Wt 160 lb (72.6 kg)    BMI 30.23 kg/m   Constitutional:  Well nourished. Alert and oriented, No acute distress. HEENT: [place] AT, mask in place.  Trachea midline Cardiovascular: No clubbing, cyanosis, or edema. Respiratory: Normal respiratory effort, no increased work of breathing. Neurologic: Grossly intact, no  focal deficits, moving all 4 extremities. Psychiatric: Normal mood and affect.     Laboratory Data: Lab Results  Component Value Date   WBC 7.4 01/30/2021   HGB 12.9 01/30/2021   HCT 39.2 01/30/2021   MCV 94.9 01/30/2021   PLT 306 01/30/2021    Lab Results  Component Value Date   CREATININE 1.24 (H) 01/30/2021    Lab Results  Component Value Date   TSH 0.077 (L) 05/17/2020    Lab Results  Component Value Date   AST 20 04/30/2020   Lab Results  Component Value Date   ALT  17 04/30/2020   Urinalysis    Component Value Date/Time   COLORURINE YELLOW (A) 05/20/2020 1208   APPEARANCEUR Clear 02/05/2021 1317   LABSPEC 1.004 (L) 05/20/2020 1208   LABSPEC 1.002 12/06/2013 1654   PHURINE 6.0 05/20/2020 1208   GLUCOSEU Negative 02/05/2021 1317   GLUCOSEU Negative 12/06/2013 1654   HGBUR NEGATIVE 05/20/2020 1208   BILIRUBINUR Negative 02/05/2021 1317   BILIRUBINUR Negative 12/06/2013 1654   KETONESUR NEGATIVE 05/20/2020 1208   PROTEINUR Negative 02/05/2021 Andrew 05/20/2020 1208   NITRITE Negative 02/05/2021 1317   NITRITE NEGATIVE 05/20/2020 1208   LEUKOCYTESUR Trace (A) 02/05/2021 1317   LEUKOCYTESUR NEGATIVE 05/20/2020 1208   LEUKOCYTESUR Negative 12/06/2013 1654  I have reviewed the labs.   Pertinent Imaging:  03/27/21 15:04  Scan Result 42mL    Assessment & Plan:    1. Urge incontinence -Has seen improvement with the Gemtesa 75 mg daily, but still not at goal -We discussed giving the Gemtesa more time or undergoing PTNS treatments to see if they given added efficacy -She would like to return for PTNS explained the PTNS provides treatment by indirectly providing electrical stimulation to the nerves responsible for bladder and pelvic floor function - a needle electrode generates an adjustable electrical pulse that travels to the sacral plexus via the tibial nerve which is located in the ankle, among other functions, the sacral nerve plexus  regulates bladder and pelvic floor function - treatment protocol requires once-a-week treatments for 12 weeks, 30 minutes per session and many patients begin to see improvements by the 6th treatment. Patients who respond to treatment may require occasional treatments (~ once every 3 weeks) to sustain improvements. PTNS is a low-risk procedure. The most common side-effects with PTNS treatment are temporary and minor, resulting from the placement of the needle electrode. They include minor bleeding, mild pain and skin inflammation and patients have seen up to an 80% success rate with this form of treatment  2. Spondylosis at L2-L3 -Patient with lower back pain interfering with walking with spondylosis at L2-L3 seen on CT 04/2020 -Referred to [facility] with Dr. Mack Guise further evaluation     Return for PTNS .  These notes generated with voice recognition software. I apologize for typographical errors.  Zara Council, PA-C  [facility] [address]  [place], [place] [address] [phone]

## 2021-03-27 ENCOUNTER — Ambulatory Visit (INDEPENDENT_AMBULATORY_CARE_PROVIDER_SITE_OTHER): Payer: Medicare Other | Admitting: Urology

## 2021-03-27 ENCOUNTER — Other Ambulatory Visit: Payer: Self-pay

## 2021-03-27 ENCOUNTER — Encounter: Payer: Self-pay | Admitting: Urology

## 2021-03-27 VITALS — BP 125/78 | HR 91 | Ht 61.0 in | Wt 160.0 lb

## 2021-03-27 DIAGNOSIS — G8929 Other chronic pain: Secondary | ICD-10-CM | POA: Diagnosis not present

## 2021-03-27 DIAGNOSIS — M5440 Lumbago with sciatica, unspecified side: Secondary | ICD-10-CM | POA: Diagnosis not present

## 2021-03-27 DIAGNOSIS — N3941 Urge incontinence: Secondary | ICD-10-CM

## 2021-03-27 LAB — BLADDER SCAN AMB NON-IMAGING

## 2021-04-09 ENCOUNTER — Other Ambulatory Visit: Payer: Self-pay

## 2021-04-09 ENCOUNTER — Ambulatory Visit (INDEPENDENT_AMBULATORY_CARE_PROVIDER_SITE_OTHER): Payer: Medicare Other

## 2021-04-09 DIAGNOSIS — N3941 Urge incontinence: Secondary | ICD-10-CM | POA: Diagnosis not present

## 2021-04-09 NOTE — Patient Instructions (Signed)
Tracking Your Bladder Symptoms    Patient Name:___________________________________________________   Sample: Day   Daytime Voids  Nighttime Voids Urgency for the Day(0-4) Number of Accidents Beverage Comments  Monday IIII II 2 I Water IIII Coffee  I      Week Starting:____________________________________   Day Daytime  Voids Nighttime  Voids Urgency for  The Day(0-4) Number of Accidents Beverages Comments                                                           This week my symptoms were:  O much better  O better O the same O worse   

## 2021-04-09 NOTE — Progress Notes (Signed)
PTNS  Session # 1  Health & Social Factors: Contributing factors of age, vaginal atrophy, HTN, CHF, COPD, anxiety, pelvic surgery and depression Caffeine: 4 Alcohol: 0 Daytime voids #per day: 10 Night-time voids #per night: 6 Urgency:Severe  Incontinence Episodes #per day: 2 Ankle used: Right Treatment Setting: 8 Feeling/ Response: Sensory & Toe Flex Comments: Consent form signed for PTNS   Performed By: Gordy Clement, CMA   Follow Up: RTC in 1 week as scheduled

## 2021-04-16 ENCOUNTER — Other Ambulatory Visit: Payer: Self-pay

## 2021-04-16 ENCOUNTER — Ambulatory Visit (INDEPENDENT_AMBULATORY_CARE_PROVIDER_SITE_OTHER): Payer: Medicare Other

## 2021-04-16 DIAGNOSIS — N3941 Urge incontinence: Secondary | ICD-10-CM | POA: Diagnosis not present

## 2021-04-16 NOTE — Progress Notes (Signed)
PTNS  Session # 2  Health & Social Factors: See previous  Caffeine: 4 Alcohol: 0 Daytime voids #per day: 10 Night-time voids #per night: 6 Urgency: Severe  Incontinence Episodes #per day: 2 Ankle used: Right (will need to use for all treatments, neuropathy LT leg)  Treatment Setting: 9 Feeling/ Response: Sensory & Toe Flex  Comments: Patient forgot voiding diary   Performed By: Gordy Clement, CMA   Follow Up: RTC in 1 week as scheduled.

## 2021-04-16 NOTE — Patient Instructions (Signed)
Tracking Your Bladder Symptoms    Patient Name:___________________________________________________   Sample: Day   Daytime Voids  Nighttime Voids Urgency for the Day(0-4) Number of Accidents Beverage Comments  Monday IIII II 2 I Water IIII Coffee  I      Week Starting:____________________________________   Day Daytime  Voids Nighttime  Voids Urgency for  The Day(0-4) Number of Accidents Beverages Comments                                                           This week my symptoms were:  O much better  O better O the same O worse   

## 2021-04-23 ENCOUNTER — Other Ambulatory Visit: Payer: Self-pay

## 2021-04-23 ENCOUNTER — Ambulatory Visit (INDEPENDENT_AMBULATORY_CARE_PROVIDER_SITE_OTHER): Payer: Medicare Other

## 2021-04-23 DIAGNOSIS — N3941 Urge incontinence: Secondary | ICD-10-CM

## 2021-04-23 NOTE — Addendum Note (Signed)
Addended by: Evelina Bucy on: 04/23/2021 04:40 PM   Modules accepted: Orders

## 2021-04-23 NOTE — Patient Instructions (Signed)
Tracking Your Bladder Symptoms    Patient Name:___________________________________________________   Sample: Day   Daytime Voids  Nighttime Voids Urgency for the Day(0-4) Number of Accidents Beverage Comments  Monday IIII II 2 I Water IIII Coffee  I      Week Starting:____________________________________   Day Daytime  Voids Nighttime  Voids Urgency for  The Day(0-4) Number of Accidents Beverages Comments                                                           This week my symptoms were:  O much better  O better O the same O worse   

## 2021-04-23 NOTE — Progress Notes (Addendum)
In and Out Catheterization  Patient is present today for a I & O catheterization due to rUTI. Patient was cleaned and prepped in a sterile fashion with betadine . A 14FR cath was inserted no complications were noted , 152ml of urine return was noted, urine was light yellow and clear in color. A clean urine sample was collected for UA & culture. Bladder was drained  And catheter was removed with out difficulty.    Performed by: Bradly Bienenstock CMA

## 2021-04-23 NOTE — Progress Notes (Addendum)
PTNS  Session # 3  Health & Social Factors: No change Caffeine: 4 Alcohol: 0 Daytime voids #per day: 8 Night-time voids #per night: 5 Urgency: Severe Incontinence Episodes #per day: 0 Ankle used: Right Treatment Setting: 4 Feeling/ Response: Sensory Comments: Pt's daughter requesting pt's urine be checked today, per Larene [place], pt will be need to be cathed for sample. See separate note.  Performed By: Bradly Bienenstock CMA  Follow Up: RTC in 1 week for PTNS 4.

## 2021-04-24 LAB — URINALYSIS, COMPLETE
Bilirubin, UA: NEGATIVE
Glucose, UA: NEGATIVE
Ketones, UA: NEGATIVE
Leukocytes,UA: NEGATIVE
Nitrite, UA: NEGATIVE
Protein,UA: NEGATIVE
RBC, UA: NEGATIVE
Specific Gravity, UA: <1.005 — ABNORMAL LOW (ref 1.005–1.030)
Urobilinogen, Ur: 0.2 mg/dL (ref 0.2–1.0)
pH, UA: 5.5 (ref 5.0–7.5)

## 2021-04-24 LAB — MICROSCOPIC EXAMINATION
Epithelial Cells (non renal): NONE SEEN /hpf (ref 0–10)
Mucus, UA: NONE SEEN

## 2021-04-26 LAB — CULTURE, URINE COMPREHENSIVE

## 2021-04-30 ENCOUNTER — Other Ambulatory Visit: Payer: Self-pay

## 2021-04-30 ENCOUNTER — Ambulatory Visit: Payer: Medicare Other

## 2021-04-30 DIAGNOSIS — N3941 Urge incontinence: Secondary | ICD-10-CM

## 2021-04-30 NOTE — Progress Notes (Signed)
PTNS ? ?Session # 4 ? ?Health & Social Factors: No Change ?Caffeine: 4 ?Alcohol: 0 ?Daytime voids #per day: 5 ?Night-time voids #per night: 5 ?Urgency: Mild ?Incontinence Episodes #per day: 1 ?Ankle used: Right ?Treatment Setting: 15 ?Feeling/ Response: Sensory & Toe Flex ?Comments: N/A ? ?Performed By: Gordy Clement, [place] ? ?Follow Up: RTC in 1 week  ? ?

## 2021-04-30 NOTE — Patient Instructions (Signed)
Tracking Your Bladder Symptoms    Patient Name:___________________________________________________   Sample: Day   Daytime Voids  Nighttime Voids Urgency for the Day(0-4) Number of Accidents Beverage Comments  Monday IIII II 2 I Water IIII Coffee  I      Week Starting:____________________________________   Day Daytime  Voids Nighttime  Voids Urgency for  The Day(0-4) Number of Accidents Beverages Comments                                                           This week my symptoms were:  O much better  O better O the same O worse   

## 2021-05-07 ENCOUNTER — Other Ambulatory Visit: Payer: Self-pay

## 2021-05-07 ENCOUNTER — Ambulatory Visit (INDEPENDENT_AMBULATORY_CARE_PROVIDER_SITE_OTHER): Payer: Medicare Other | Admitting: Family Medicine

## 2021-05-07 DIAGNOSIS — N3941 Urge incontinence: Secondary | ICD-10-CM | POA: Diagnosis not present

## 2021-05-07 NOTE — Progress Notes (Signed)
PTNS ?  ?Session # 5 ?  ?Health & Social Factors: No Change ?Caffeine: 4 ?Alcohol: 0 ?Daytime voids #per day: 6 ?Night-time voids #per night: 6 ?Urgency: Strong ?Incontinence Episodes #per day: 1 ?Ankle used: Right ?Treatment Setting: 12 ?Feeling/ Response: Sensory & Toe Flex ?Comments: N/A ?  ?Performed NO:[facility] Louretta Shorten CMA ?  ?Follow Up: RTC in 1 week  ?

## 2021-05-07 NOTE — Patient Instructions (Signed)
Tracking Your Bladder Symptoms    Patient Name:___________________________________________________   Sample: Day   Daytime Voids  Nighttime Voids Urgency for the Day(0-4) Number of Accidents Beverage Comments  Monday IIII II 2 I Water IIII Coffee  I      Week Starting:____________________________________   Day Daytime  Voids Nighttime  Voids Urgency for  The Day(0-4) Number of Accidents Beverages Comments                                                           This week my symptoms were:  O much better  O better O the same O worse   

## 2021-05-14 ENCOUNTER — Ambulatory Visit (INDEPENDENT_AMBULATORY_CARE_PROVIDER_SITE_OTHER): Payer: Medicare Other

## 2021-05-14 ENCOUNTER — Other Ambulatory Visit: Payer: Self-pay

## 2021-05-14 DIAGNOSIS — N3941 Urge incontinence: Secondary | ICD-10-CM | POA: Diagnosis not present

## 2021-05-14 NOTE — Progress Notes (Signed)
PTNS ? ?Session # 6 ? ?Health & Social Factors: No change ?Caffeine: 5 ?Alcohol: 0 ?Daytime voids #per day: 6 ?Night-time voids #per night: 5 ?Urgency: Strong ?Incontinence Episodes #per day: 0 ?Ankle used: Right ?Treatment Setting: 7 ?Feeling/ Response: Sensory ?Comments: Pt tolerated well, no complications noted.  ? ?Performed By: Bradly Bienenstock CMA ? ? ?

## 2021-05-14 NOTE — Patient Instructions (Signed)
Tracking Your Bladder Symptoms    Patient Name:___________________________________________________   Sample: Day   Daytime Voids  Nighttime Voids Urgency for the Day(0-4) Number of Accidents Beverage Comments  Monday IIII II 2 I Water IIII Coffee  I      Week Starting:____________________________________   Day Daytime  Voids Nighttime  Voids Urgency for  The Day(0-4) Number of Accidents Beverages Comments                                                           This week my symptoms were:  O much better  O better O the same O worse   

## 2021-05-21 ENCOUNTER — Other Ambulatory Visit: Payer: Self-pay

## 2021-05-21 ENCOUNTER — Ambulatory Visit (INDEPENDENT_AMBULATORY_CARE_PROVIDER_SITE_OTHER): Payer: Medicare Other | Admitting: Family Medicine

## 2021-05-21 DIAGNOSIS — N3941 Urge incontinence: Secondary | ICD-10-CM | POA: Diagnosis not present

## 2021-05-21 NOTE — Patient Instructions (Signed)
Tracking Your Bladder Symptoms    Patient Name:___________________________________________________   Sample: Day   Daytime Voids  Nighttime Voids Urgency for the Day(0-4) Number of Accidents Beverage Comments  Monday IIII II 2 I Water IIII Coffee  I      Week Starting:____________________________________   Day Daytime  Voids Nighttime  Voids Urgency for  The Day(0-4) Number of Accidents Beverages Comments                                                           This week my symptoms were:  O much better  O better O the same O worse   

## 2021-05-21 NOTE — Progress Notes (Signed)
PTNS ?  ?Session # 7 ?  ?Health & Social Factors: No change ?Caffeine: 5 ?Alcohol: 0 ?Daytime voids #per day: 6 ?Night-time voids #per night: 5 ?Urgency: Strong ?Incontinence Episodes #per day: 0-1 ?Ankle used:  ?Treatment Setting: 10 ?Feeling/ Response: Sensory ?Comments: Pt tolerated well, no complications noted.  ?  ?Performed By: Elberta Leatherwood CMA  ?  ?

## 2021-05-28 ENCOUNTER — Ambulatory Visit (INDEPENDENT_AMBULATORY_CARE_PROVIDER_SITE_OTHER): Payer: Medicare Other | Admitting: Physician Assistant

## 2021-05-28 DIAGNOSIS — N3941 Urge incontinence: Secondary | ICD-10-CM | POA: Diagnosis not present

## 2021-05-28 NOTE — Patient Instructions (Signed)
Tracking Your Bladder Symptoms    Patient Name:___________________________________________________   Sample: Day   Daytime Voids  Nighttime Voids Urgency for the Day(0-4) Number of Accidents Beverage Comments  Monday IIII II 2 I Water IIII Coffee  I      Week Starting:____________________________________   Day Daytime  Voids Nighttime  Voids Urgency for  The Day(0-4) Number of Accidents Beverages Comments                                                           This week my symptoms were:  O much better  O better O the same O worse   

## 2021-05-28 NOTE — Progress Notes (Signed)
PTNS ? ?Session # 8 of 46 ? ?Health & Social Factors: no change ?Caffeine: 2 ?Alcohol: 0 ?Daytime voids #per day: 5-6 ?Night-time voids #per night: 4-5 ?Urgency: severe ?Incontinence Episodes #per day: 2 ?Ankle used: right ?Treatment Setting: 10 ?Feeling/ Response: both ?Comments: Patient reports slight symptomatic improvement. ? ?Performed By: Debroah Loop, PA-C  ? ?Follow Up: 1 week for PTNS #9 of 45  ?

## 2021-06-04 ENCOUNTER — Ambulatory Visit (INDEPENDENT_AMBULATORY_CARE_PROVIDER_SITE_OTHER): Payer: Medicare Other | Admitting: Urology

## 2021-06-04 DIAGNOSIS — N3941 Urge incontinence: Secondary | ICD-10-CM | POA: Diagnosis not present

## 2021-06-04 NOTE — Progress Notes (Signed)
PTNS ? ?Session # 9/45 ? ?Health & Social Factors: No Change ?Caffeine: 2 ?Alcohol: 0 ?Daytime voids #per day: 6 ?Night-time voids #per night: 4 ?Urgency: Mild - None ?Incontinence Episodes #per day: 0 ?Ankle used: Right ?Treatment Setting: 7 ?Feeling/ Response: Sensory & Toe Flex ?Comments: Patient brought voiding diary. ? ?Performed By: Gordy Clement, [place] ? ?Follow Up: RTC in 1 week as scheduled.  ? ?

## 2021-06-04 NOTE — Patient Instructions (Signed)
Tracking Your Bladder Symptoms    Patient Name:___________________________________________________   Sample: Day   Daytime Voids  Nighttime Voids Urgency for the Day(0-4) Number of Accidents Beverage Comments  Monday IIII II 2 I Water IIII Coffee  I      Week Starting:____________________________________   Day Daytime  Voids Nighttime  Voids Urgency for  The Day(0-4) Number of Accidents Beverages Comments                                                           This week my symptoms were:  O much better  O better O the same O worse   

## 2021-06-07 ENCOUNTER — Other Ambulatory Visit: Payer: Self-pay | Admitting: Urology

## 2021-06-11 ENCOUNTER — Ambulatory Visit (INDEPENDENT_AMBULATORY_CARE_PROVIDER_SITE_OTHER): Payer: Medicare Other | Admitting: Physician Assistant

## 2021-06-11 DIAGNOSIS — N3941 Urge incontinence: Secondary | ICD-10-CM

## 2021-06-11 NOTE — Progress Notes (Signed)
PTNS ? ?Session # 10/45 ? ?Health & Social Factors: no change ?Caffeine: 1 ?Alcohol: 0 ?Daytime voids #per day: 6-7 ?Night-time voids #per night: 5-7 ?Urgency: strong ?Incontinence Episodes #per day: 0-2 ?Ankle used: right ?Treatment Setting: 9 ?Feeling/ Response: sensory ?Comments: patient requested more superior needle placement today ? ?Performed By: Debroah Loop, PA-C  ? ?Follow Up: 1 week for PTNS #11/45  ?

## 2021-06-11 NOTE — Patient Instructions (Signed)
Tracking Your Bladder Symptoms    Patient Name:___________________________________________________   Sample: Day   Daytime Voids  Nighttime Voids Urgency for the Day(0-4) Number of Accidents Beverage Comments  Monday IIII II 2 I Water IIII Coffee  I      Week Starting:____________________________________   Day Daytime  Voids Nighttime  Voids Urgency for  The Day(0-4) Number of Accidents Beverages Comments                                                           This week my symptoms were:  O much better  O better O the same O worse   

## 2021-06-18 ENCOUNTER — Ambulatory Visit (INDEPENDENT_AMBULATORY_CARE_PROVIDER_SITE_OTHER): Payer: Medicare Other | Admitting: Physician Assistant

## 2021-06-18 DIAGNOSIS — N3941 Urge incontinence: Secondary | ICD-10-CM

## 2021-06-18 NOTE — Patient Instructions (Signed)
Tracking Your Bladder Symptoms    Patient Name:___________________________________________________   Sample: Day   Daytime Voids  Nighttime Voids Urgency for the Day(0-4) Number of Accidents Beverage Comments  Monday IIII II 2 I Water IIII Coffee  I      Week Starting:____________________________________   Day Daytime  Voids Nighttime  Voids Urgency for  The Day(0-4) Number of Accidents Beverages Comments                                                           This week my symptoms were:  O much better  O better O the same O worse   

## 2021-06-18 NOTE — Progress Notes (Signed)
PTNS ?  ?Session # 11/45 ?  ?Health & Social Factors: no change ?Caffeine: 1 ?Alcohol: 0 ?Daytime voids #per day: 6-7 ?Night-time voids #per night: 5-7 ?Urgency: strong ?Incontinence Episodes #per day: 0-2 ?Ankle used: right ?Treatment Setting: 2 ?Feeling/ Response: sensory ?Comments: patient requested more superior needle placement today ?  ?Performed By: Gaspar Cola CMA  ?  ?Follow Up: 1 week for PTNS #11/45  ?

## 2021-06-25 ENCOUNTER — Ambulatory Visit (INDEPENDENT_AMBULATORY_CARE_PROVIDER_SITE_OTHER): Payer: Medicare Other | Admitting: Physician Assistant

## 2021-06-25 DIAGNOSIS — N3941 Urge incontinence: Secondary | ICD-10-CM | POA: Diagnosis not present

## 2021-06-25 NOTE — Progress Notes (Signed)
PTNS ? ?Session # 12/45 ? ?Health & Social Factors: no change ?Caffeine: 1 ?Alcohol: 0 ?Daytime voids #per day: 5-7 ?Night-time voids #per night: 5-7 ?Urgency: none ?Incontinence Episodes #per day: 0-2 ?Ankle used: right ?Treatment Setting: 4 ?Feeling/ Response: both sensory and tow flex ?Comments: patient tolerated well ? ?Performed By: Fonnie Jarvis, CMA ? ?Follow Up: 1 month follow up ? ?

## 2021-07-16 ENCOUNTER — Other Ambulatory Visit: Payer: Self-pay | Admitting: Internal Medicine

## 2021-07-16 DIAGNOSIS — R1901 Right upper quadrant abdominal swelling, mass and lump: Secondary | ICD-10-CM

## 2021-07-18 NOTE — Progress Notes (Signed)
MRN : [id number]  [name] is a 88 y.o. (Nov 14, 1932) female who presents with chief complaint of legs swell.  History of Present Illness:   The patient returns to the office for followup evaluation regarding leg swelling.  The swelling has persisted but with the lymph pump is under much, much better controlled. The pain associated with swelling is decreased. There have not been any interval development of a ulcerations or wounds.  The patient denies problems with the pump, noting it is working well and the leggings are in good condition.  Since the previous visit the patient has been wearing graduated compression stockings and using the lymph pump on a routine basis and  has noted significant improvement in the lymphedema.   Patient stated the lymph pump has been helpful with the treatment of the lymphedema.    She is also c/o a skin lesion on the left medial knee that is painful.  She has seen a general surgeon who recommended she see an orthopedic surgeon.  No outpatient medications have been marked as taking for the 07/19/21 encounter (Appointment) with Delana Meyer, Dolores Lory, MD.    Past Medical History:  Diagnosis Date   Afib ([place])    Anxiety    Anxiety    Basal cell carcinoma 11/11/2017   Right inferior knee. Superficial and nodular patterns.   Bronchitis    recent   Cancer [facility])    Lymphoma    Chronic kidney disease    ckd stage 3   Congestive heart failure (CHF) (HCC)    Coronary artery disease    Status post stent placement in LAD   Dysrhythmia    GERD (gastroesophageal reflux disease)    Hyperlipemia    Hypertension    Hypothyroidism    Squamous cell carcinoma of skin 06/13/2020   L mid pretibia, biopsy only   Thyroid disease     Past Surgical History:  Procedure Laterality Date   ABDOMINAL HYSTERECTOMY     complete   CORONARY ANGIOPLASTY     stent   CORONARY STENT PLACEMENT     EXCISION OF ABDOMINAL WALL TUMOR Right 05/08/2018   Procedure:  EXCISION OF ABDOMINAL WALL-RIGHT;  Surgeon: Herbert Pun, MD;  Location: [facility] ORS;  Service: General;  Laterality: Right;   INCISIONAL HERNIA REPAIR N/A 05/08/2018   Procedure: LAPAROSCOPIC VS. OPEN INCISIONAL HERNIA REPAIR WITH MESH;  Surgeon: Herbert Pun, MD;  Location: [facility] ORS;  Service: General;  Laterality: N/A;   THYROIDECTOMY Bilateral 11/17/2019   Procedure: TOTAL THYROIDECTOMY;  Surgeon: Margaretha Sheffield, MD;  Location: [facility] ORS;  Service: ENT;  Laterality: Bilateral;   VEIN SURGERY     Endovenous ablation of saphenous vein    Social History Social History   Tobacco Use   Smoking status: Never   Smokeless tobacco: Never  Vaping Use   Vaping Use: Never used  Substance Use Topics   Alcohol use: No   Drug use: No    Family History Family History  Problem Relation Age of Onset   Heart Problems Mother    Heart Problems Father    Heart attack Father    Diabetes Brother    Diabetes Brother     Allergies  Allergen Reactions   Cefdinir Other (See Comments)   Sulfa Antibiotics Hives and Rash     REVIEW OF SYSTEMS (Negative unless checked)  Constitutional: '[]'$ Weight loss  '[]'$ Fever  '[]'$ Chills Cardiac: '[]'$ Chest pain   '[]'$ Chest pressure   '[]'$ Palpitations   '[]'$ Shortness  of breath when laying flat   '[]'$ Shortness of breath with exertion. Vascular:  '[]'$ Pain in legs with walking   '[x]'$ Pain in legs with standing  '[]'$ History of DVT   '[]'$ Phlebitis   '[x]'$ Swelling in legs   '[]'$ Varicose veins   '[]'$ Non-healing ulcers Pulmonary:   '[]'$ Uses home oxygen   '[]'$ Productive cough   '[]'$ Hemoptysis   '[]'$ Wheeze  '[]'$ COPD   '[]'$ Asthma Neurologic:  '[]'$ Dizziness   '[]'$ Seizures   '[]'$ History of stroke   '[]'$ History of TIA  '[]'$ Aphasia   '[]'$ Vissual changes   '[]'$ Weakness or numbness in arm   '[]'$ Weakness or numbness in leg Musculoskeletal:   '[]'$ Joint swelling   '[]'$ Joint pain   '[]'$ Low back pain Hematologic:  '[]'$ Easy bruising  '[]'$ Easy bleeding   '[]'$ Hypercoagulable state   '[]'$ Anemic Gastrointestinal:  '[]'$ Diarrhea   '[]'$ Vomiting   '[]'$ Gastroesophageal reflux/heartburn   '[]'$ Difficulty swallowing. Genitourinary:  '[]'$ Chronic kidney disease   '[]'$ Difficult urination  '[]'$ Frequent urination   '[]'$ Blood in urine Skin:  '[]'$ Rashes   '[]'$ Ulcers  Psychological:  '[]'$ History of anxiety   '[]'$  History of major depression.  Physical Examination  There were no vitals filed for this visit. There is no height or weight on file to calculate BMI. Gen: WD/WN, NAD Head: [place]/AT, No temporalis wasting.  Ear/Nose/Throat: Hearing grossly intact, nares w/o erythema or drainage, pinna without lesions Eyes: PER, EOMI, sclera nonicteric.  Neck: Supple, no gross masses.  No JVD.  Pulmonary:  Good air movement, no audible wheezing, no use of accessory muscles.  Cardiac: RRR, precordium not hyperdynamic. Vascular:  scattered varicosities present bilaterally.  Mild venous stasis changes to the legs bilaterally.  3-4+ soft pitting edema  Vessel Right Left  Radial Palpable Palpable  Gastrointestinal: soft, non-distended. No guarding/no peritoneal signs.  Musculoskeletal: M/S 5/5 throughout.  No deformity.  Neurologic: CN 2-12 intact. Pain and light touch intact in extremities.  Symmetrical.  Speech is fluent. Motor exam as listed above. Psychiatric: Judgment intact, Mood & affect appropriate for pt's clinical situation. Dermatologic: 10 mm lesion medial left knee tender to palpation not bleeding.  Venous rashes no ulcers noted.  No changes consistent with cellulitis. Lymph : No lichenification or skin changes of chronic lymphedema.  CBC Lab Results  Component Value Date   WBC 7.4 01/30/2021   HGB 12.9 01/30/2021   HCT 39.2 01/30/2021   MCV 94.9 01/30/2021   PLT 306 01/30/2021    BMET    Component Value Date/Time   NA 135 01/30/2021 1929   NA 139 01/30/2015 0932   NA 138 06/21/2014 0503   K 4.6 01/30/2021 1929   K 3.9 06/21/2014 0503   CL 100 01/30/2021 1929   CL 107 06/21/2014 0503   CO2 27 01/30/2021 1929   CO2 27 06/21/2014 0503   GLUCOSE 130  (H) 01/30/2021 1929   GLUCOSE 100 (H) 06/21/2014 0503   BUN 28 (H) 01/30/2021 1929   BUN 17 01/30/2015 0932   BUN 21 (H) 06/21/2014 0503   CREATININE 1.24 (H) 01/30/2021 1929   CREATININE 1.22 (H) 06/21/2014 0503   CALCIUM 8.9 01/30/2021 1929   CALCIUM 8.6 (L) 06/21/2014 0503   GFRNONAA 42 (L) 01/30/2021 1929   GFRNONAA 42 (L) 06/21/2014 0503   GFRAA 43 (L) 11/05/2019 1003   GFRAA 49 (L) 06/21/2014 0503   CrCl cannot be calculated (Patient's most recent lab result is older than the maximum 21 days allowed.).  COAG Lab Results  Component Value Date   INR 1.1 09/21/2019    Radiology No results found.   Assessment/Plan  1. Skin lesion of left lower limb Patient appears to have a skin cancer that is painful  It should be removed under local  Risk and benefits were reviewed the patient.  Indications for the procedure were reviewed.  All questions were answered, the patient agrees to proceed.    2. Lymphedema Recommend:  No surgery or intervention at this point in time.    I have reviewed my discussion with the patient regarding lymphedema and why it  causes symptoms.  Patient will continue wearing graduated compression on a daily basis. The patient should put the compression on first thing in the morning and removing them in the evening. The patient should not sleep in the compression.   In addition, behavioral modification throughout the day will be continued.  This will include frequent elevation (such as in a recliner), use of over the counter pain medications as needed and exercise such as walking.  The systemic causes for chronic edema such as liver, kidney and cardiac etiologies does not appear to have significant changed over the past year.    The patient will continue aggressive use of the  lymph pump.  This will continue to improve the edema control and prevent sequela such as ulcers and infections.   The patient will follow-up with me on an annual basis.    3.  Chronic venous insufficiency Recommend:  No surgery or intervention at this point in time.    I have reviewed my discussion with the patient regarding lymphedema and why it  causes symptoms.  Patient will continue wearing graduated compression on a daily basis. The patient should put the compression on first thing in the morning and removing them in the evening. The patient should not sleep in the compression.   In addition, behavioral modification throughout the day will be continued.  This will include frequent elevation (such as in a recliner), use of over the counter pain medications as needed and exercise such as walking.  The systemic causes for chronic edema such as liver, kidney and cardiac etiologies does not appear to have significant changed over the past year.    The patient will continue aggressive use of the  lymph pump.  This will continue to improve the edema control and prevent sequela such as ulcers and infections.   The patient will follow-up with me on an annual basis.    4. Primary hypertension Continue antihypertensive medications as already ordered, these medications have been reviewed and there are no changes at this time.   5. Arteriosclerosis of coronary artery Continue cardiac and antihypertensive medications as already ordered and reviewed, no changes at this time.  Continue statin as ordered and reviewed, no changes at this time  Nitrates PRN for chest pain   6. Simple chronic bronchitis (HCC) Continue pulmonary medications and aerosols as already ordered, these medications have been reviewed and there are no changes at this time.      Hortencia Pilar, MD  07/18/2021 9:10 PM

## 2021-07-19 ENCOUNTER — Encounter (INDEPENDENT_AMBULATORY_CARE_PROVIDER_SITE_OTHER): Payer: Self-pay | Admitting: Vascular Surgery

## 2021-07-19 ENCOUNTER — Ambulatory Visit (INDEPENDENT_AMBULATORY_CARE_PROVIDER_SITE_OTHER): Payer: Medicare Other | Admitting: Vascular Surgery

## 2021-07-19 VITALS — BP 144/76 | HR 79 | Resp 16 | Wt 162.6 lb

## 2021-07-19 DIAGNOSIS — I890 Lymphedema, not elsewhere classified: Secondary | ICD-10-CM

## 2021-07-19 DIAGNOSIS — L989 Disorder of the skin and subcutaneous tissue, unspecified: Secondary | ICD-10-CM

## 2021-07-19 DIAGNOSIS — I10 Essential (primary) hypertension: Secondary | ICD-10-CM | POA: Diagnosis not present

## 2021-07-19 DIAGNOSIS — I872 Venous insufficiency (chronic) (peripheral): Secondary | ICD-10-CM | POA: Diagnosis not present

## 2021-07-19 DIAGNOSIS — J410 Simple chronic bronchitis: Secondary | ICD-10-CM

## 2021-07-19 DIAGNOSIS — I2510 Atherosclerotic heart disease of native coronary artery without angina pectoris: Secondary | ICD-10-CM

## 2021-07-21 ENCOUNTER — Encounter (INDEPENDENT_AMBULATORY_CARE_PROVIDER_SITE_OTHER): Payer: Self-pay | Admitting: Vascular Surgery

## 2021-07-21 DIAGNOSIS — L989 Disorder of the skin and subcutaneous tissue, unspecified: Secondary | ICD-10-CM | POA: Insufficient documentation

## 2021-07-24 ENCOUNTER — Ambulatory Visit
Admission: RE | Admit: 2021-07-24 | Discharge: 2021-07-24 | Disposition: A | Discharge status: Home / Self Care | Payer: Medicare Other | Source: Ambulatory Visit | Attending: Internal Medicine | Admitting: Internal Medicine

## 2021-07-24 DIAGNOSIS — R1901 Right upper quadrant abdominal swelling, mass and lump: Secondary | ICD-10-CM | POA: Diagnosis present

## 2021-07-25 ENCOUNTER — Ambulatory Visit: Payer: Medicare Other | Admitting: Urology

## 2021-07-30 ENCOUNTER — Telehealth (INDEPENDENT_AMBULATORY_CARE_PROVIDER_SITE_OTHER): Payer: Self-pay | Admitting: Vascular Surgery

## 2021-07-30 NOTE — Telephone Encounter (Signed)
Patient's daughter LVM stating they are waiting on the prior authorization for skin cancer removal with Dr. Delana Meyer.  Please advise.

## 2021-07-30 NOTE — Progress Notes (Unsigned)
07/31/2021 4:02 PM   [name] 1933/01/21 [id number]  Referring provider: Tracie Harrier, MD [address] [facility] [place],  [place] [address]  Urological history: 1. High risk hematuria -non-smoker -contrast CT 2022 - NED -cysto 2017- NED -RUS 2022 -NED -cysto 2022 - NED -no reports of gross heme -UA, 03/2021 - negative for micro heme  2. Urge incontinence -Contributing factors of age, vaginal atrophy, HTN, CHF, COPD, anxiety, pelvic surgery and depression -failed oxybutynin IR TID - no improvement -failed Myrbetriq 25 mg daily -PVR 134 mL  HPI: [name] is a 88 y.o. female who presents today for one month follow up after a trial of PTNS.   She has indicated frequency, urgency and leakage of urination on her review of symptoms performed.  On her OAB questionnaire, she indicates that she is having 8 or more daytime urinations, 3 or more nighttime urinations and is leaking urine with stress and urge.  She is leaking 3 or more times daily and wears 10 absorbent pads daily.   She limits her fluid intake on occasion.  She does engage in toilet mapping.    Patient denies any modifying or aggravating factors.  Patient denies any gross hematuria, dysuria or suprapubic/flank pain.  Patient denies any fevers, chills, nausea or vomiting.    She feels the PTNS has helped as she was having 5 to 6 episodes of nocturia and now she is having 3 to 4 episodes of nocturia.    PMH: Past Medical History:  Diagnosis Date   Afib ([place])    Anxiety    Anxiety    Basal cell carcinoma 11/11/2017   Right inferior knee. Superficial and nodular patterns.   Bronchitis    recent   Cancer [facility])    Lymphoma    Chronic kidney disease    ckd stage 3   Congestive heart failure (CHF) (HCC)    Coronary artery disease    Status post stent placement in LAD   Dysrhythmia    GERD (gastroesophageal reflux disease)    Hyperlipemia    Hypertension    Hypothyroidism     Squamous cell carcinoma of skin 06/13/2020   L mid pretibia, biopsy only   Thyroid disease     Surgical History: Past Surgical History:  Procedure Laterality Date   ABDOMINAL HYSTERECTOMY     complete   CORONARY ANGIOPLASTY     stent   CORONARY STENT PLACEMENT     EXCISION OF ABDOMINAL WALL TUMOR Right 05/08/2018   Procedure: EXCISION OF ABDOMINAL WALL-RIGHT;  Surgeon: Herbert Pun, MD;  Location: [facility] ORS;  Service: General;  Laterality: Right;   INCISIONAL HERNIA REPAIR N/A 05/08/2018   Procedure: LAPAROSCOPIC VS. OPEN INCISIONAL HERNIA REPAIR WITH MESH;  Surgeon: Herbert Pun, MD;  Location: [facility] ORS;  Service: General;  Laterality: N/A;   THYROIDECTOMY Bilateral 11/17/2019   Procedure: TOTAL THYROIDECTOMY;  Surgeon: Margaretha Sheffield, MD;  Location: [facility] ORS;  Service: ENT;  Laterality: Bilateral;   VEIN SURGERY     Endovenous ablation of saphenous vein    Home Medications:  Allergies as of 07/31/2021       Reactions   Cefdinir Other (See Comments)   Sulfa Antibiotics Hives, Rash        Medication List        Accurate as of July 31, 2021  4:02 PM. If you have any questions, ask your nurse or doctor.          STOP  taking these medications    EasiVent inhaler Stopped by: Chi Woodham, PA-C   QUEtiapine 25 MG tablet Commonly known as: SEROQUEL Stopped by: Zara Council, PA-C       TAKE these medications    acetaminophen 325 MG tablet Commonly known as: TYLENOL Take 1 tablet (325 mg total) by mouth every 6 (six) hours as needed for mild pain, fever or headache (or Fever >/= 101).   albuterol 108 (90 Base) MCG/ACT inhaler Commonly known as: VENTOLIN HFA SMARTSIG:2 Inhalation Via Inhaler Every 6 Hours PRN   Biotin 5 MG Tabs Take by mouth.   Calcium Carb-Cholecalciferol 600-10 MG-MCG Tabs Take by mouth.   DULoxetine 20 MG capsule Commonly known as: CYMBALTA Take 20 mg by mouth daily.   furosemide 20 MG tablet Commonly known as:  LASIX Take 1 tablet (20 mg total) by mouth once daily   Gemtesa 75 MG Tabs Generic drug: Vibegron TAKE 75 MG BY MOUTH DAILY.   levothyroxine 112 MCG tablet Commonly known as: SYNTHROID TAKE 1 TABLET ONCE DAILY ON EMPTY STOMACH WITH GLASS OF WATER 30-60 MINUTES BEFORE BREAKFAST   levothyroxine 125 MCG tablet Commonly known as: SYNTHROID Take by mouth.   pantoprazole 40 MG tablet Commonly known as: PROTONIX Take 40 mg by mouth daily.   rOPINIRole 2 MG tablet Commonly known as: REQUIP Take 2 mg by mouth 2 (two) times daily.   spironolactone 25 MG tablet Commonly known as: ALDACTONE Take 0.5 tablets (12.5 mg total) by mouth once daily   traMADol 50 MG tablet Commonly known as: ULTRAM Take by mouth as needed.   WOMENS 50+ MULTI VITAMIN/MIN PO Take by mouth.        Allergies:  Allergies  Allergen Reactions   Cefdinir Other (See Comments)   Sulfa Antibiotics Hives and Rash    Family History: Family History  Problem Relation Age of Onset   Heart Problems Mother    Heart Problems Father    Heart attack Father    Diabetes Brother    Diabetes Brother     Social History:  reports that she has never smoked. She has never used smokeless tobacco. She reports that she does not drink alcohol and does not use drugs.  ROS: Pertinent ROS in HPI  Physical Exam: BP 114/65   Pulse 83   Ht '5\' 1"'$  (1.549 m)   Wt 161 lb (73 kg)   BMI 30.42 kg/m   Constitutional:  Well nourished. Alert and oriented, No acute distress. HEENT: [place] AT, moist mucus membranes.  Trachea midline, no masses. Cardiovascular: No clubbing, cyanosis, or edema. Respiratory: Normal respiratory effort, no increased work of breathing. GU: No CVA tenderness.  No bladder fullness or masses.  Atrophic  external genitalia, normal pubic hair distribution, no lesions.  Normal urethral meatus, no lesions, no prolapse, no discharge.   No urethral masses, tenderness and/or tenderness. No bladder fullness, tenderness  or masses. Pale vagina mucosa, poor estrogen effect, no discharge, ? Possible lichen sclerosis vs vaginal atrophy, fair pelvic support, no cystocele and no rectocele noted.  Leaked on Valsalva.  Anus and perineum are without rashes or lesions.     Neurologic: Grossly intact, no focal deficits, moving all 4 extremities. Psychiatric: Normal mood and affect.     Laboratory Data: N/A  I have reviewed the labs.   Pertinent Imaging:  07/31/21 15:09  Scan Result 134    Assessment & Plan:    1. Urge incontinence -PVR < 300 cc -continue Gemtesa 75 mg daily  2. Nocturia -PTNS has reduced symptoms -she will continue the monthly maintenance at this time  3. Mixed incontinence -offered pelvic floor PT, but patient deferred at this time  Return for Maintenance PTNS.  These notes generated with voice recognition software. I apologize for typographical errors.  Zara Council, PA-C  [facility] [address]  [place], [place] [address] [phone]   I spent 30 minutes on the day of the encounter to include pre-visit record review, face-to-face time with the patient, and post-visit ordering of tests.

## 2021-07-31 ENCOUNTER — Ambulatory Visit (INDEPENDENT_AMBULATORY_CARE_PROVIDER_SITE_OTHER): Payer: Medicare Other | Admitting: Urology

## 2021-07-31 ENCOUNTER — Encounter: Payer: Self-pay | Admitting: Urology

## 2021-07-31 VITALS — BP 114/65 | HR 83 | Ht 61.0 in | Wt 161.0 lb

## 2021-07-31 DIAGNOSIS — N3946 Mixed incontinence: Secondary | ICD-10-CM | POA: Diagnosis not present

## 2021-07-31 DIAGNOSIS — R351 Nocturia: Secondary | ICD-10-CM | POA: Diagnosis not present

## 2021-07-31 DIAGNOSIS — N3941 Urge incontinence: Secondary | ICD-10-CM

## 2021-07-31 LAB — BLADDER SCAN AMB NON-IMAGING: Scan Result: 134

## 2021-07-31 NOTE — Telephone Encounter (Signed)
I attempted to contact the patient to schedule her excision of left knee lesion and a message was left on the primary phone number which is the daughter's number for a return call.

## 2021-08-03 ENCOUNTER — Telehealth (INDEPENDENT_AMBULATORY_CARE_PROVIDER_SITE_OTHER): Payer: Self-pay

## 2021-08-03 NOTE — Telephone Encounter (Signed)
Spoke with the patient's daughter and she is scheduled on 08/17/21 with Dr. Delana Meyer for a excision of left knee lesion at the [facility]. Pre-op phone call is on 08/09/21 between 8-1 pm. Pre-surgical instructions were discussed and will be mailed.

## 2021-08-07 ENCOUNTER — Ambulatory Visit (INDEPENDENT_AMBULATORY_CARE_PROVIDER_SITE_OTHER): Payer: Medicare Other | Admitting: Urology

## 2021-08-07 DIAGNOSIS — N3941 Urge incontinence: Secondary | ICD-10-CM | POA: Diagnosis not present

## 2021-08-07 NOTE — Progress Notes (Signed)
PTNS  Session # monthly maintenance  Health & Social Factors: No change Caffeine: 1 Alcohol: 0 Daytime voids #per day: 5-7 Night-time voids #per night: 5-7 Urgency: none Incontinence Episodes #per day: 0-2 Ankle used: right  Treatment Setting: 6 Feeling/ Response: sensation  Comments: Patient tolerated procedure  Performed By: Zara Council   Follow Up: One month

## 2021-08-09 ENCOUNTER — Encounter
Admission: RE | Admit: 2021-08-09 | Discharge: 2021-08-09 | Disposition: A | Discharge status: Home / Self Care | Payer: Medicare Other | Source: Ambulatory Visit | Attending: Vascular Surgery | Admitting: Vascular Surgery

## 2021-08-09 VITALS — Ht 61.0 in | Wt 160.0 lb

## 2021-08-09 DIAGNOSIS — I872 Venous insufficiency (chronic) (peripheral): Secondary | ICD-10-CM

## 2021-08-09 DIAGNOSIS — I5032 Chronic diastolic (congestive) heart failure: Secondary | ICD-10-CM

## 2021-08-09 DIAGNOSIS — I10 Essential (primary) hypertension: Secondary | ICD-10-CM

## 2021-08-09 DIAGNOSIS — Z8572 Personal history of non-Hodgkin lymphomas: Secondary | ICD-10-CM

## 2021-08-09 HISTORY — DX: Unspecified osteoarthritis, unspecified site: M19.90

## 2021-08-09 HISTORY — DX: Non-Hodgkin lymphoma, unspecified, unspecified site: C85.90

## 2021-08-09 HISTORY — DX: Chronic obstructive pulmonary disease, unspecified: J44.9

## 2021-08-09 NOTE — Patient Instructions (Addendum)
Your procedure is scheduled on: Friday August 17, 2021. Report to Day Surgery inside [facility] 2nd floor, stop by admissions desk first before getting on elevator. To find out your arrival time please call [phone] between 1PM - 3PM on Thursday August 16, 2021.  Remember: Instructions that are not followed completely may result in serious medical risk,  up to and including death, or upon the discretion of your surgeon and anesthesiologist your  surgery may need to be rescheduled.     _X__ 1. Do not eat food after midnight the night before your procedure.                 No chewing gum or hard candies.   __X__2.  On the morning of surgery brush your teeth with toothpaste and water, you                may rinse your mouth with mouthwash if you wish.  Do not swallow any toothpaste of mouthwash.     _X__ 3.  No Alcohol for 24 hours before or after surgery.   _X__ 4.  Do Not Smoke or use e-cigarettes For 24 Hours Prior to Your Surgery.                 Do not use any chewable tobacco products for at least 6 hours prior to                 Surgery.  _X__  5.  Do not use any recreational drugs (marijuana, cocaine, heroin, ecstasy, MDMA or other)                For at least one week prior to your surgery.  Combination of these drugs with anesthesia                May have life threatening results.  ____  6.  Bring all medications with you on the day of surgery if instructed.   __X__  7.  Notify your doctor if there is any change in your medical condition      (cold, fever, infections).     Do not wear jewelry, make-up, hairpins, clips or nail polish. Do not wear lotions, powders, or perfumes. You may wear deodorant. Do not shave 48 hours prior to surgery. Men may shave face and neck. Do not bring valuables to the hospital.    [facility] is not responsible for any belongings or valuables.  Contacts, dentures or bridgework may not be worn into surgery. Leave your  suitcase in the car. After surgery it may be brought to your room. For patients admitted to the hospital, discharge time is determined by your treatment team.   Patients discharged the day of surgery will not be allowed to drive home.   Make arrangements for someone to be with you for the first 24 hours of your Same Day Discharge.   __X__ Take these medicines the morning of surgery with A SIP OF WATER:    1. DULoxetine (CYMBALTA) 20 MG   2. GEMTESA 75 MG  3. levothyroxine (SYNTHROID) 112 MCG  4. pantoprazole (PROTONIX) 40 MG  5. rOPINIRole (REQUIP) 2 MG  6.  ____ Fleet Enema (as directed)   __X__ Use CHG Soap (or wipes) as directed  ____ Use Benzoyl Peroxide Gel as instructed  ____ Use inhalers on the day of surgery  ____ Stop metformin 2 days prior to surgery    ____ Take 1/2 of usual insulin dose  the night before surgery. No insulin the morning          of surgery.   ____ Call your PCP, cardiologist, or Pulmonologist if taking Coumadin/Plavix/aspirin and ask when to stop before your surgery.   __X__ One Week prior to surgery- Stop Anti-inflammatories such as Ibuprofen, Aleve, Advil, Motrin, meloxicam (MOBIC), diclofenac, etodolac, ketorolac, Toradol, Daypro, piroxicam, Goody's or BC powders. OK TO USE TYLENOL IF NEEDED   __X__ Stop supplements until after surgery. Biotin 5 MG, Turmeric 500 MG, VITAMIN B 12 PO   ____ Bring C-Pap to the hospital.    If you have any questions regarding your pre-procedure instructions,  Please call Pre-admit Testing at [phone]

## 2021-08-10 ENCOUNTER — Other Ambulatory Visit: Payer: Self-pay

## 2021-08-10 ENCOUNTER — Encounter
Admission: RE | Admit: 2021-08-10 | Discharge: 2021-08-10 | Disposition: A | Discharge status: Home / Self Care | Payer: Medicare Other | Source: Ambulatory Visit | Attending: Vascular Surgery | Admitting: Vascular Surgery

## 2021-08-10 DIAGNOSIS — I10 Essential (primary) hypertension: Secondary | ICD-10-CM | POA: Diagnosis not present

## 2021-08-10 DIAGNOSIS — Z01812 Encounter for preprocedural laboratory examination: Secondary | ICD-10-CM | POA: Diagnosis present

## 2021-08-10 DIAGNOSIS — I872 Venous insufficiency (chronic) (peripheral): Secondary | ICD-10-CM

## 2021-08-10 DIAGNOSIS — L989 Disorder of the skin and subcutaneous tissue, unspecified: Secondary | ICD-10-CM | POA: Diagnosis not present

## 2021-08-10 DIAGNOSIS — I5032 Chronic diastolic (congestive) heart failure: Secondary | ICD-10-CM | POA: Insufficient documentation

## 2021-08-10 DIAGNOSIS — Z8572 Personal history of non-Hodgkin lymphomas: Secondary | ICD-10-CM

## 2021-08-10 LAB — CBC
HCT: 41.7 % (ref 36.0–46.0)
Hemoglobin: 13.8 g/dL (ref 12.0–15.0)
MCH: 31.2 pg (ref 26.0–34.0)
MCHC: 33.1 g/dL (ref 30.0–36.0)
MCV: 94.1 fL (ref 80.0–100.0)
Platelets: 266 10*3/uL (ref 150–400)
RBC: 4.43 MIL/uL (ref 3.87–5.11)
RDW: 14.5 % (ref 11.5–15.5)
WBC: 5.5 10*3/uL (ref 4.0–10.5)
nRBC: 0 % (ref 0.0–0.2)

## 2021-08-10 LAB — BASIC METABOLIC PANEL
Anion gap: 9 (ref 5–15)
BUN: 34 mg/dL — ABNORMAL HIGH (ref 8–23)
CO2: 30 mmol/L (ref 22–32)
Calcium: 9 mg/dL (ref 8.9–10.3)
Chloride: 101 mmol/L (ref 98–111)
Creatinine, Ser: 1.39 mg/dL — ABNORMAL HIGH (ref 0.44–1.00)
GFR, Estimated: 37 mL/min — ABNORMAL LOW (ref >60)
Glucose, Bld: 104 mg/dL — ABNORMAL HIGH (ref 70–99)
Potassium: 3.7 mmol/L (ref 3.5–5.1)
Sodium: 140 mmol/L (ref 135–145)

## 2021-08-15 ENCOUNTER — Encounter: Payer: Self-pay | Admitting: Vascular Surgery

## 2021-08-15 NOTE — Progress Notes (Signed)
Perioperative Services  Pre-Admission/Anesthesia Testing Clinical Review  Date: 08/16/21  Patient Demographics:  Name: [name] DOB:   Apr 05, 1932 MRN:   [id number]  Planned Surgical Procedure(s):    Case: [id number] Date/Time: 08/17/21 1040   Procedure: LESION REMOVAL (KNEE) (Left)   Anesthesia type: General   Pre-op diagnosis: DISORDER OF SKIN AND SUBCUTANEOUS TISSUE   Location: [facility]   Surgeons: Katha Cabal, MD   NOTE: Available PAT nursing documentation and vital signs have been reviewed. Clinical nursing staff has updated patient's PMH/PSHx, current medication list, and drug allergies/intolerances to ensure comprehensive history available to assist in medical decision making as it pertains to the aforementioned surgical procedure and anticipated anesthetic course. Extensive review of available clinical information performed. [name] PMH and PSHx updated with any diagnoses/procedures that  may have been inadvertently omitted during her intake with the pre-admission testing department's nursing staff.  Clinical Discussion:  [name] is a 88 y.o. female who is submitted for pre-surgical anesthesia review and clearance prior to her undergoing the above procedure. Patient has never been a smoker. Pertinent PMH includes: CAD, atrial fibrillation, CHF, aortic atherosclerosis, peripheral edema, HTN, HLD, hypothyroidism, CKD-III, GERD (on daily PPI), OA, lumbar DDD, spinal stenosis, memory loss, HOH, anxiety.  Patient is followed by cardiology Ubaldo Glassing, MD). She was last seen in the cardiology clinic on 01/13/2021; notes reviewed.  At the time of her clinic visit, patient reporting chronic shortness of breath, chest tightness, and chronic peripheral edema.  Symptoms were not necessarily associated with exertion.  Patient had been recently started on diuretic therapy, and since doing so, she reported that she feels a little better.  Patient  denied any PND, orthopnea, palpitations, vertiginous symptoms, or presyncope/syncope.  Patient with past medical history significant for cardiovascular diagnoses.  Patient underwent a diagnostic left heart catheterization on 06/03/2008 revealing multivessel CAD; 40% proximal LAD-1, 60% proximal LAD-2, 90% mid LAD-1, 30% mid LAD-2, 50% mid LAD-3.  Subsequent PCI was performed placing a 2.0 x 12 mm Mini-Vision BMS x1 to the mid LAD-1 lesion yielding excellent angiographic results and TIMI-3 flow.  Diagnostic left heart catheterization was performed on 02/12/2010 revealing multivessel CAD; 50% proximal LAD, 45% distal LAD, 25% proximal LCx, 30% mid LCx, 20% proximal RCA, and 20% D1.  Patient with 30% ISR of the previously placed stent to the mid LAD.  Further intervention was deferred opting for medical management.  Last TTE was performed on 08/24/2020 revealing a normal left ventricular systolic function with mild LVH; LVEF >55%.  There was severe mitral annular calcification with no evidence of stenosis.  Aortic valve leaflets thickened and sclerotic without evidence of stenosis.  There was mild mitral, tricuspid, and pulmonary valve regurgitation.  Patient with a diagnosis of paroxysmal atrial fibrillation; CHA2DS2-VASc Score = 6 (age x 2, sex, CHF, HTN, aortic plaque).  Rate and rhythm currently being maintained without the use of pharmacological intervention.  She is not a candidate for oral anticoagulation per cardiology. Blood pressure well controlled at 122/70 on currently prescribed diuretic therapies.  Patient not currently taking any type of lipid-lowering therapies for her HLD diagnosis or ASCVD prevention.  She is not diabetic.  Functional capacity limited by age-related debility, peripheral neuropathy, and her multiple medical comorbidities.  Patient questionably able to achieve 4 METS of activity without angina/anginal equivalent symptoms.  No changes were made to her medication regimen.  Patient  to follow-up with outpatient cardiology in 3 months  or sooner if needed.  [name] is scheduled for the REMOVAL OF A LEFT KNEE SOFT TISSUE LESION on 08/17/2021 with Dr. Hortencia Pilar, MD. Given patient's past medical history significant for cardiovascular diagnoses, presurgical cardiac clearance was sought by the PAT team. Per cardiology, "this patient is optimized for surgery and may proceed with the planned procedural course with a LOW risk of significant perioperative cardiovascular complications". In review of her medication reconciliation, the patient is not noted to be taking any type of anticoagulation or antiplatelet therapies that would need to be held during her perioperative course.  Patient denies previous perioperative complications with anesthesia in the past. In review of the available records, it is noted that patient underwent a general anesthetic course here at [facility] (ASA III) in 10/2019 without documented complications.      08/09/2021   10:33 AM 07/31/2021    3:01 PM 07/19/2021    4:02 PM  Vitals with BMI  Height _0  _1    Weight 160 lbs 161 lbs 162 lbs 10 oz  BMI 40.08 67.61   Systolic  950 932  Diastolic  65 76  Pulse  83 79    Providers/Specialists:   NOTE: Primary physician provider listed below. Patient may have been seen by APP or partner within same practice.   PROVIDER ROLE / SPECIALTY LAST OV  Schnier, Dolores Lory, MD Vascular Surgery (Surgeon) 07/19/2021  Tracie Harrier, MD Primary Care Provider 07/16/2021  Bartholome Bill, MD Cardiology 01/12/2021  Lyla Son, MD Nephrology 11/22/2020  Earlie Server, MD Medical Oncology 09/28/2019    Allergies:  Cefdinir and Sulfa antibiotics  Current Home Medications:   No current facility-administered medications for this encounter.    acetaminophen (TYLENOL) 325 MG tablet   albuterol (VENTOLIN HFA) 108 (90 Base) MCG/ACT inhaler   Biotin 5 MG TABS   Calcium  Carb-Cholecalciferol 600-10 MG-MCG TABS   Cyanocobalamin (VITAMIN B 12 PO)   DULoxetine (CYMBALTA) 20 MG capsule   furosemide (LASIX) 20 MG tablet   GEMTESA 75 MG TABS   levothyroxine (SYNTHROID) 112 MCG tablet   levothyroxine (SYNTHROID) 125 MCG tablet   Magnesium 250 MG TABS   Multiple Vitamins-Minerals (WOMENS 50+ MULTI VITAMIN/MIN PO)   pantoprazole (PROTONIX) 40 MG tablet   QUEtiapine (SEROQUEL) 25 MG tablet   rOPINIRole (REQUIP) 2 MG tablet   spironolactone (ALDACTONE) 25 MG tablet   traMADol (ULTRAM) 50 MG tablet   Turmeric 500 MG CAPS   History:   Past Medical History:  Diagnosis Date   Afib (HCC)    a.) CHA2DS2-VASc = 6 (age x 2, sex, CHF, HTN, aortic plaque). b.) rate/rhythm maintained without pharmacological intervention; she is not on oral anticoagulation   Anxiety    Aortic atherosclerosis (HCC)    Arthritis    Basal cell carcinoma 11/11/2017   Right inferior knee. Superficial and nodular patterns.   Bilateral lower extremity edema    CKD (chronic kidney disease), stage III (HCC)    Congestive heart failure (CHF) ([place]) 05/31/2014   a.) TTE 05/31/2014: EF >55%, RVE; LAE; triv panvalvular regurg; G1DD. b.) TTE 06/29/2017: EF >55%; RVE; BAE; G1DD. c.) TTE 05/09/2018: EF 55-60%; LAE; mod MR, mild AR/TR. d.) TTE 04/29/2020: EF 60-65%; mi;d-mod MR/TR; RVSP 36.6 mmHg. e.) TTE 08/24/2020: EF >55%; mild LVH; mild MR/TR/PR; sev MAC.   COPD (chronic obstructive pulmonary disease) (HCC)    Coronary artery disease 06/03/2008   a.) LHC 06/03/2008: 40% pLAD-1, 60% pLAD-2, 90% mLAD-1, 30%  mLAD-2, 50% mLAD-3 --> PCI placing a 2.0 x 12 mm Mini-Vision BMS x 1 to mLAD-1 lesion. b.) LHC 02/12/10: 50% pLAD, 30% ISR mLAD, 25% dLAD, 25% pLCx, 30% mLCx, 20% pRCA, 20% D1; further intervention deferred opting for medical mgmt.   DDD (degenerative disc disease), lumbar    Depression    GERD (gastroesophageal reflux disease)    HOH (hard of hearing)    a.) uses BILATERAL hearing aids   Hurthle  cell neoplasm of thyroid    a.) s/p total thyroidectomy 11/17/2019   Hyperlipemia    Hypertension    Hypothyroidism    Lumbar spinal stenosis    Memory loss    Non Hodgkin's lymphoma ([place]) 2002   Squamous cell carcinoma of skin 06/13/2020   L mid pretibia, biopsy only   Urinary incontinence    Past Surgical History:  Procedure Laterality Date   CORONARY ANGIOPLASTY WITH STENT PLACEMENT Left 06/03/2008   Procedure: CORONARY ANGIOPLASTY WITH STENT PLACEMENT; Location: [place]; Surgeon(s): Serafina Royals, MD (diagnostic) and Katrine Coho, MD (interventional)   EXCISION OF ABDOMINAL WALL TUMOR Right 05/08/2018   Procedure: EXCISION OF ABDOMINAL WALL-RIGHT;  Surgeon: Herbert Pun, MD;  Location: [facility];  Service: General;  Laterality: Right;   INCISIONAL HERNIA REPAIR N/A 05/08/2018   Procedure: LAPAROSCOPIC VS. OPEN INCISIONAL HERNIA REPAIR WITH MESH;  Surgeon: Herbert Pun, MD;  Location: [facility];  Service: General;  Laterality: N/A;   LEFT HEART CATH AND CORONARY ANGIOGRAPHY Left 02/12/2010   Procedure: LEFT HEART CATH AND CORONARY ANGIOGRAPHY; Location: [place]; Surgeon: Serafina Royals, MD   THYROIDECTOMY Bilateral 11/17/2019   Procedure: TOTAL THYROIDECTOMY;  Surgeon: Margaretha Sheffield, MD;  Location: [facility];  Service: ENT;  Laterality: Bilateral;   TOTAL ABDOMINAL HYSTERECTOMY N/A    VEIN SURGERY     Endovenous ablation of saphenous vein   Family History  Problem Relation Age of Onset   Heart Problems Mother    Heart Problems Father    Heart attack Father    Diabetes Brother    Diabetes Brother    Social History   Tobacco Use   Smoking status: Never   Smokeless tobacco: Never  Vaping Use   Vaping Use: Never used  Substance Use Topics   Alcohol use: No   Drug use: No    Pertinent Clinical Results:  LABS: Labs reviewed: Acceptable for surgery.  No visits with results within 3 Day(s) from this visit.  Latest known visit with results is:  Hospital  Outpatient Visit on 08/10/2021  Component Date Value Ref Range Status   WBC 08/10/2021 5.5  4.0 - 10.5 K/uL Final   RBC 08/10/2021 4.43  3.87 - 5.11 MIL/uL Final   Hemoglobin 08/10/2021 13.8  12.0 - 15.0 g/dL Final   HCT 08/10/2021 41.7  36.0 - 46.0 % Final   MCV 08/10/2021 94.1  80.0 - 100.0 fL Final   MCH 08/10/2021 31.2  26.0 - 34.0 pg Final   MCHC 08/10/2021 33.1  30.0 - 36.0 g/dL Final   RDW 08/10/2021 14.5  11.5 - 15.5 % Final   Platelets 08/10/2021 266  150 - 400 K/uL Final   nRBC 08/10/2021 0.0  0.0 - 0.2 % Final   Performed at [facility], [address] [place], [place] [address]   Sodium 08/10/2021 140  135 - 145 mmol/L Final   Potassium 08/10/2021 3.7  3.5 - 5.1 mmol/L Final   Chloride 08/10/2021 101  98 - 111 mmol/L Final   CO2 08/10/2021 30  22 - 32 mmol/L Final   Glucose, Bld 08/10/2021 104 (H)  70 - 99 mg/dL Final   Glucose reference range applies only to samples taken after fasting for at least 8 hours.   BUN 08/10/2021 34 (H)  8 - 23 mg/dL Final   Creatinine, Ser 08/10/2021 1.39 (H)  0.44 - 1.00 mg/dL Final   Calcium 08/10/2021 9.0  8.9 - 10.3 mg/dL Final   GFR, Estimated 08/10/2021 37 (L)  >60 mL/min Final   Comment: (NOTE) Calculated using the CKD-EPI Creatinine Equation (2021)    Anion gap 08/10/2021 9  5 - 15 Final   Performed at [facility], [address] [place], [place] [address]    ECG: Date: 08/16/2021 Time ECG obtained: 1550 PM Rate: 76 bpm Rhythm:  Normal sinus rhythm with sinus arrhythmia Axis (leads I and aVF): Normal Intervals: PR 158 ms. QRS 76 ms. QTc 456 ms. ST segment and T wave changes: No evidence of acute ST segment elevation or depression Comparison: Similar to previous tracing obtained on 01/30/2021   IMAGING / PROCEDURES: TRANSTHORACIC ECHOCARDIOGRAM performed on 04/29/2020 Left ventricular ejection fraction, by estimation, is 60 to 65%. The left ventricle has normal function. The left ventricle has no  regional wall motion abnormalities. Left ventricular diastolic parameters are consistent with Grade I diastolic dysfunction (impaired relaxation).  Right ventricular systolic function is normal. The right ventricular size is normal. There is mildly elevated pulmonary artery systolic pressure. The estimated right ventricular systolic pressure is 79.0 mmHg.  Left atrial size was mildly dilated.  The mitral valve is normal in structure. Mild to moderate mitral valve regurgitation.  Tricuspid valve regurgitation is mild to moderate.  The aortic valve is normal in structure. Aortic valve regurgitation is mild.   Impression and Plan:  [name] has been referred for pre-anesthesia review and clearance prior to her undergoing the planned anesthetic and procedural courses. Available labs, pertinent testing, and imaging results were personally reviewed by me. This patient has been appropriately cleared by cardiology with an overall LOW risk of significant perioperative cardiovascular complications.  Based on clinical review performed today (08/16/21), barring any significant acute changes in the patient's overall condition, it is anticipated that she will be able to proceed with the planned surgical intervention. Any acute changes in clinical condition may necessitate her procedure being postponed and/or cancelled. Patient will meet with anesthesia team (MD and/or CRNA) on the day of her procedure for preoperative evaluation/assessment. Questions regarding anesthetic course will be fielded at that time.   Pre-surgical instructions were reviewed with the patient during her PAT appointment and questions were fielded by PAT clinical staff. Patient was advised that if any questions or concerns arise prior to her procedure then she should return a call to PAT and/or her surgeon's office to discuss.  Honor Loh, MSN, APRN, FNP-C, CEN [facility]  Peri-operative Services Nurse  Practitioner Phone: [phone] Fax: [phone] 08/16/21 12:39 PM  NOTE: This note has been prepared using Dragon dictation software. Despite my best ability to proofread, there is always the potential that unintentional transcriptional errors may still occur from this process.

## 2021-08-16 ENCOUNTER — Encounter: Payer: Self-pay | Admitting: Vascular Surgery

## 2021-08-16 ENCOUNTER — Telehealth (INDEPENDENT_AMBULATORY_CARE_PROVIDER_SITE_OTHER): Payer: Self-pay

## 2021-08-16 NOTE — Telephone Encounter (Addendum)
I contacted the patient to reschedule her surgery with Dr. Delana Meyer for a excision of left knee lesion. Patient stated she would have her daughter to return my call. Spoke with the patient's daughter and the patient has been rescheduled to 08/22/21 at the [facility]. Patient's daughter did not like it but received the information.

## 2021-08-17 DIAGNOSIS — L989 Disorder of the skin and subcutaneous tissue, unspecified: Secondary | ICD-10-CM

## 2021-08-22 ENCOUNTER — Other Ambulatory Visit: Payer: Self-pay

## 2021-08-22 ENCOUNTER — Ambulatory Visit
Admission: RE | Admit: 2021-08-22 | Discharge: 2021-08-22 | Disposition: A | Discharge status: Home / Self Care | Payer: Medicare Other | Attending: Vascular Surgery | Admitting: Vascular Surgery

## 2021-08-22 ENCOUNTER — Encounter
Admission: RE | Disposition: A | Discharge status: Home / Self Care | Payer: Self-pay | Source: Home / Self Care | Attending: Vascular Surgery

## 2021-08-22 ENCOUNTER — Encounter: Payer: Self-pay | Admitting: Vascular Surgery

## 2021-08-22 ENCOUNTER — Ambulatory Visit: Payer: Medicare Other | Admitting: Urgent Care

## 2021-08-22 DIAGNOSIS — E785 Hyperlipidemia, unspecified: Secondary | ICD-10-CM | POA: Insufficient documentation

## 2021-08-22 DIAGNOSIS — J449 Chronic obstructive pulmonary disease, unspecified: Secondary | ICD-10-CM | POA: Diagnosis not present

## 2021-08-22 DIAGNOSIS — N183 Chronic kidney disease, stage 3 unspecified: Secondary | ICD-10-CM | POA: Insufficient documentation

## 2021-08-22 DIAGNOSIS — L989 Disorder of the skin and subcutaneous tissue, unspecified: Secondary | ICD-10-CM | POA: Diagnosis present

## 2021-08-22 DIAGNOSIS — I4891 Unspecified atrial fibrillation: Secondary | ICD-10-CM | POA: Insufficient documentation

## 2021-08-22 DIAGNOSIS — Z8572 Personal history of non-Hodgkin lymphomas: Secondary | ICD-10-CM | POA: Insufficient documentation

## 2021-08-22 DIAGNOSIS — Z79890 Hormone replacement therapy (postmenopausal): Secondary | ICD-10-CM | POA: Insufficient documentation

## 2021-08-22 DIAGNOSIS — Z955 Presence of coronary angioplasty implant and graft: Secondary | ICD-10-CM | POA: Insufficient documentation

## 2021-08-22 DIAGNOSIS — F32A Depression, unspecified: Secondary | ICD-10-CM | POA: Diagnosis not present

## 2021-08-22 DIAGNOSIS — E039 Hypothyroidism, unspecified: Secondary | ICD-10-CM | POA: Diagnosis not present

## 2021-08-22 DIAGNOSIS — C44729 Squamous cell carcinoma of skin of left lower limb, including hip: Secondary | ICD-10-CM

## 2021-08-22 DIAGNOSIS — I509 Heart failure, unspecified: Secondary | ICD-10-CM | POA: Insufficient documentation

## 2021-08-22 DIAGNOSIS — K219 Gastro-esophageal reflux disease without esophagitis: Secondary | ICD-10-CM | POA: Insufficient documentation

## 2021-08-22 DIAGNOSIS — F419 Anxiety disorder, unspecified: Secondary | ICD-10-CM | POA: Insufficient documentation

## 2021-08-22 DIAGNOSIS — C44722 Squamous cell carcinoma of skin of right lower limb, including hip: Secondary | ICD-10-CM | POA: Diagnosis not present

## 2021-08-22 DIAGNOSIS — I130 Hypertensive heart and chronic kidney disease with heart failure and stage 1 through stage 4 chronic kidney disease, or unspecified chronic kidney disease: Secondary | ICD-10-CM | POA: Diagnosis not present

## 2021-08-22 DIAGNOSIS — I088 Other rheumatic multiple valve diseases: Secondary | ICD-10-CM | POA: Diagnosis not present

## 2021-08-22 DIAGNOSIS — Z79899 Other long term (current) drug therapy: Secondary | ICD-10-CM | POA: Insufficient documentation

## 2021-08-22 DIAGNOSIS — I2510 Atherosclerotic heart disease of native coronary artery without angina pectoris: Secondary | ICD-10-CM | POA: Insufficient documentation

## 2021-08-22 HISTORY — DX: Other amnesia: R41.3

## 2021-08-22 HISTORY — DX: Other intervertebral disc degeneration, lumbar region without mention of lumbar back pain or lower extremity pain: M51.369

## 2021-08-22 HISTORY — DX: Atherosclerosis of aorta: I70.0

## 2021-08-22 HISTORY — DX: Unspecified hearing loss, unspecified ear: H91.90

## 2021-08-22 HISTORY — DX: Other intervertebral disc degeneration, lumbar region: M51.36

## 2021-08-22 HISTORY — DX: Benign neoplasm of thyroid gland: D34

## 2021-08-22 HISTORY — DX: Localized edema: R60.0

## 2021-08-22 HISTORY — DX: Chronic kidney disease, stage 3 unspecified: N18.30

## 2021-08-22 HISTORY — DX: Unspecified urinary incontinence: R32

## 2021-08-22 HISTORY — DX: Depression, unspecified: F32.A

## 2021-08-22 HISTORY — DX: Spinal stenosis, lumbar region without neurogenic claudication: M48.061

## 2021-08-22 HISTORY — PX: LESION REMOVAL: SHX5196

## 2021-08-22 SURGERY — WIDE EXCISION, LESION, UPPER EXTREMITY
Anesthesia: Monitor Anesthesia Care | Site: Knee | Laterality: Left

## 2021-08-22 MED ORDER — PROPOFOL 10 MG/ML IV BOLUS
INTRAVENOUS | Status: AC
  Filled 2021-08-22: qty 20

## 2021-08-22 MED ORDER — ORAL CARE MOUTH RINSE: 15.0000 mL | Freq: Once | OROMUCOSAL | Status: AC

## 2021-08-22 MED ORDER — FENTANYL CITRATE (PF) 100 MCG/2ML IJ SOLN: 25.0000 ug | INTRAMUSCULAR | Status: DC | PRN

## 2021-08-22 MED ORDER — OXYCODONE HCL 5 MG PO TABS: 5.0000 mg | ORAL_TABLET | Freq: Once | ORAL | Status: DC | PRN

## 2021-08-22 MED ORDER — VANCOMYCIN HCL IN DEXTROSE 1-5 GM/200ML-% IV SOLN
1000.0000 mg | INTRAVENOUS | Status: AC
  Administered 2021-08-22: 1000 mg via INTRAVENOUS

## 2021-08-22 MED ORDER — CHLORHEXIDINE GLUCONATE 0.12 % MT SOLN
OROMUCOSAL | Status: AC
  Administered 2021-08-22: 15 mL via OROMUCOSAL
  Filled 2021-08-22: qty 15

## 2021-08-22 MED ORDER — MIDAZOLAM HCL 2 MG/2ML IJ SOLN
INTRAMUSCULAR | Status: AC
  Filled 2021-08-22: qty 2

## 2021-08-22 MED ORDER — FENTANYL CITRATE (PF) 100 MCG/2ML IJ SOLN
INTRAMUSCULAR | Status: DC | PRN
  Administered 2021-08-22 (×2): 25 ug via INTRAVENOUS

## 2021-08-22 MED ORDER — FENTANYL CITRATE (PF) 100 MCG/2ML IJ SOLN
INTRAMUSCULAR | Status: AC
  Filled 2021-08-22: qty 2

## 2021-08-22 MED ORDER — ONDANSETRON HCL 4 MG/2ML IJ SOLN
INTRAMUSCULAR | Status: AC
  Filled 2021-08-22: qty 2

## 2021-08-22 MED ORDER — BUPIVACAINE HCL (PF) 0.5 % IJ SOLN
INTRAMUSCULAR | Status: AC
Start: 2021-08-22 — End: ?
  Filled 2021-08-22: qty 30

## 2021-08-22 MED ORDER — ONDANSETRON HCL 4 MG/2ML IJ SOLN
INTRAMUSCULAR | Status: DC | PRN
  Administered 2021-08-22: 4 mg via INTRAVENOUS

## 2021-08-22 MED ORDER — CHLORHEXIDINE GLUCONATE CLOTH 2 % EX PADS
6.0000 | MEDICATED_PAD | Freq: Once | CUTANEOUS | Status: AC
Start: 2021-08-22 — End: 2021-08-22
  Administered 2021-08-22: 6 via TOPICAL

## 2021-08-22 MED ORDER — ACETAMINOPHEN 10 MG/ML IV SOLN: 1000.0000 mg | Freq: Once | INTRAVENOUS | Status: DC | PRN

## 2021-08-22 MED ORDER — SODIUM CHLORIDE 0.9 % IV SOLN: INTRAVENOUS | Status: DC

## 2021-08-22 MED ORDER — VANCOMYCIN HCL IN DEXTROSE 1-5 GM/200ML-% IV SOLN
INTRAVENOUS | Status: AC
  Filled 2021-08-22: qty 200

## 2021-08-22 MED ORDER — CHLORHEXIDINE GLUCONATE CLOTH 2 % EX PADS
6.0000 | MEDICATED_PAD | Freq: Once | CUTANEOUS | Status: AC
  Administered 2021-08-22: 6 via TOPICAL

## 2021-08-22 MED ORDER — MIDAZOLAM HCL 2 MG/2ML IJ SOLN
INTRAMUSCULAR | Status: DC | PRN
  Administered 2021-08-22: 1 mg via INTRAVENOUS

## 2021-08-22 MED ORDER — ONDANSETRON HCL 4 MG/2ML IJ SOLN: 4.0000 mg | Freq: Once | INTRAMUSCULAR | Status: DC | PRN

## 2021-08-22 MED ORDER — OXYCODONE HCL 5 MG/5ML PO SOLN: 5.0000 mg | Freq: Once | ORAL | Status: DC | PRN

## 2021-08-22 MED ORDER — LIDOCAINE-EPINEPHRINE (PF) 1 %-1:200000 IJ SOLN
INTRAMUSCULAR | Status: DC | PRN
  Administered 2021-08-22: 5 mL via INTRAMUSCULAR

## 2021-08-22 MED ORDER — BUPIVACAINE-EPINEPHRINE (PF) 0.5% -1:200000 IJ SOLN
INTRAMUSCULAR | Status: AC
  Filled 2021-08-22: qty 30

## 2021-08-22 MED ORDER — CHLORHEXIDINE GLUCONATE 0.12 % MT SOLN: 15.0000 mL | Freq: Once | OROMUCOSAL | Status: AC

## 2021-08-22 SURGICAL SUPPLY — 29 items
BLADE SURG 15 STRL LF DISP TIS (BLADE) ×1 IMPLANT
BLADE SURG 15 STRL SS (BLADE) ×1
CHLORAPREP W/TINT 26 (MISCELLANEOUS) ×2 IMPLANT
DERMABOND ADVANCED (GAUZE/BANDAGES/DRESSINGS) ×1
DERMABOND ADVANCED .7 DNX12 (GAUZE/BANDAGES/DRESSINGS) ×1 IMPLANT
DRAPE LAPAROTOMY 100X77 ABD (DRAPES) ×2 IMPLANT
DRESSING SURGICEL FIBRLLR 1X2 (HEMOSTASIS) ×1 IMPLANT
DRSG SURGICEL FIBRILLAR 1X2 (HEMOSTASIS)
ELECT CAUTERY BLADE 6.4 (BLADE) ×2 IMPLANT
ELECT REM PT RETURN 9FT ADLT (ELECTROSURGICAL) ×2
ELECTRODE REM PT RTRN 9FT ADLT (ELECTROSURGICAL) ×1 IMPLANT
GAUZE 4X4 16PLY ~~LOC~~+RFID DBL (SPONGE) ×2 IMPLANT
GLOVE SURG SYN 8.0 (GLOVE) ×2 IMPLANT
GLOVE SURG SYN 8.0 PF PI (GLOVE) ×1 IMPLANT
GOWN STRL REUS W/ TWL LRG LVL3 (GOWN DISPOSABLE) ×1 IMPLANT
GOWN STRL REUS W/ TWL XL LVL3 (GOWN DISPOSABLE) ×1 IMPLANT
GOWN STRL REUS W/TWL LRG LVL3 (GOWN DISPOSABLE) ×1
GOWN STRL REUS W/TWL XL LVL3 (GOWN DISPOSABLE) ×2
KIT TURNOVER KIT A (KITS) ×2 IMPLANT
MANIFOLD NEPTUNE II (INSTRUMENTS) ×2 IMPLANT
NDL HYPO 25X1 1.5 SAFETY (NEEDLE) ×1 IMPLANT
NEEDLE HYPO 25X1 1.5 SAFETY (NEEDLE) ×2 IMPLANT
NS IRRIG 500ML POUR BTL (IV SOLUTION) ×2 IMPLANT
PACK BASIN MINOR ARMC (MISCELLANEOUS) ×2 IMPLANT
SUT MNCRL+ 5-0 UNDYED PC-3 (SUTURE) ×1 IMPLANT
SUT MONOCRYL 5-0 (SUTURE) ×1
SUT VIC AB 3-0 SH 27 (SUTURE) ×1
SUT VIC AB 3-0 SH 27X BRD (SUTURE) ×1 IMPLANT
SYR 10ML LL (SYRINGE) ×2 IMPLANT

## 2021-08-22 NOTE — Anesthesia Preprocedure Evaluation (Addendum)
Anesthesia Evaluation  Patient identified by MRN, date of birth, ID band Patient awake    Reviewed: Allergy & Precautions, NPO status , Patient's Chart, lab work & pertinent test results  History of Anesthesia Complications Negative for: history of anesthetic complications  Airway Mallampati: II  TM Distance: >3 FB Neck ROM: Full    Dental  (+) Upper Dentures, Lower Dentures   Pulmonary neg sleep apnea, COPD,  COPD inhaler, Patient abstained from smoking.Not current smoker,    Pulmonary exam normal breath sounds clear to auscultation       Cardiovascular Exercise Tolerance: Good METShypertension, Pt. on medications + CAD, + Cardiac Stents and +CHF  (-) Past MI (-) dysrhythmias  Rhythm:Regular Rate:Normal - Systolic murmurs ? Last TTE was performed on 08/24/2020 revealing a normal left ventricular systolic function with mild LVH; LVEF >55%.  There was severe mitral annular calcification with no evidence of stenosis.  Aortic valve leaflets thickened and sclerotic without evidence of stenosis.  There was mild mitral, tricuspid, and pulmonary valve regurgitation.   Neuro/Psych PSYCHIATRIC DISORDERS Anxiety Depression negative neurological ROS     GI/Hepatic GERD  Medicated and Controlled,(+)     (-) substance abuse  ,   Endo/Other  neg diabetesHypothyroidism   Renal/GU CRFRenal disease     Musculoskeletal  (+) Arthritis ,   Abdominal   Peds  Hematology  (+) Blood dyscrasia, anemia ,   Anesthesia Other Findings Past Medical History: No date: Afib (HCC)     Comment:  a.) CHA2DS2-VASc = 6 (age x 2, sex, CHF, HTN, aortic               plaque). b.) rate/rhythm maintained without               pharmacological intervention; she is not on oral               anticoagulation No date: Anxiety No date: Aortic atherosclerosis (HCC) No date: Arthritis 11/11/2017: Basal cell carcinoma     Comment:  Right inferior knee.  Superficial and nodular patterns. No date: Bilateral lower extremity edema No date: CKD (chronic kidney disease), stage III ([place]) 05/31/2014: Congestive heart failure (CHF) ([place])     Comment:  a.) TTE 05/31/2014: EF >55%, RVE; LAE; triv panvalvular               regurg; G1DD. b.) TTE 06/29/2017: EF >55%; RVE; BAE;               G1DD. c.) TTE 05/09/2018: EF 55-60%; LAE; mod MR, mild               AR/TR. d.) TTE 04/29/2020: EF 60-65%; mi;d-mod MR/TR;               RVSP 36.6 mmHg. e.) TTE 08/24/2020: EF >55%; mild LVH;               mild MR/TR/PR; sev MAC. No date: COPD (chronic obstructive pulmonary disease) ([place]) 06/03/2008: Coronary artery disease     Comment:  a.) LHC 06/03/2008: 40% pLAD-1, 60% pLAD-2, 90% mLAD-1,               30% mLAD-2, 50% mLAD-3 --> PCI placing a 2.0 x 12 mm               Mini-Vision BMS x 1 to mLAD-1 lesion. b.) LHC 02/12/10:               50% pLAD, 30% ISR mLAD, 25% dLAD, 25% pLCx,  30% mLCx, 20%              pRCA, 20% D1; further intervention deferred opting for               medical mgmt. No date: DDD (degenerative disc disease), lumbar No date: Depression No date: GERD (gastroesophageal reflux disease) No date: HOH (hard of hearing)     Comment:  a.) uses BILATERAL hearing aids No date: Hurthle cell neoplasm of thyroid     Comment:  a.) s/p total thyroidectomy 11/17/2019 No date: Hyperlipemia No date: Hypertension No date: Hypothyroidism No date: Lumbar spinal stenosis No date: Memory loss 2002: Non Hodgkin's lymphoma ([place]) 06/13/2020: Squamous cell carcinoma of skin     Comment:  L mid pretibia, biopsy only No date: Urinary incontinence  Reproductive/Obstetrics                            Anesthesia Physical Anesthesia Plan  ASA: 3  Anesthesia Plan: MAC   Post-op Pain Management: Minimal or no pain anticipated   Induction: Intravenous  PONV Risk Score and Plan: 2 and Propofol infusion, TIVA and Ondansetron  Airway  Management Planned: Nasal Cannula and Natural Airway  Additional Equipment: None  Intra-op Plan:   Post-operative Plan:   Informed Consent: I have reviewed the patients History and Physical, chart, labs and discussed the procedure including the risks, benefits and alternatives for the proposed anesthesia with the patient or authorized representative who has indicated his/her understanding and acceptance.     Dental advisory given  Plan Discussed with: CRNA and Surgeon  Anesthesia Plan Comments: (Discussed risks of anesthesia with patient, including possibility of difficulty with spontaneous ventilation under anesthesia necessitating airway intervention, PONV, and rare risks such as cardiac or respiratory or neurological events, and allergic reactions. Discussed the role of CRNA in patient's perioperative care. Patient understands.)       Anesthesia Quick Evaluation

## 2021-08-22 NOTE — Discharge Instructions (Addendum)
AMBULATORY SURGERY  DISCHARGE INSTRUCTIONS   The drugs that you were given will stay in your system until tomorrow so for the next 24 hours you should not:  Drive an automobile Make any legal decisions Drink any alcoholic beverage   You may resume regular meals tomorrow.  Today it is better to start with liquids and gradually work up to solid foods.  You may eat anything you prefer, but it is better to start with liquids, then soup and crackers, and gradually work up to solid foods.   Please notify your doctor immediately if you have any unusual bleeding, trouble breathing, redness and pain at the surgery site, drainage, fever, or pain not relieved by medication.   Please call to schedule your post-operative visit ASAP; visit in 2 weeks.

## 2021-08-22 NOTE — Interval H&P Note (Signed)
History and Physical Interval Note:  08/22/2021 10:29 AM  [name]  has presented today for surgery, with the diagnosis of DISORDER OF SKIN AND SUBCUTANEOUS TISSUE.  The various methods of treatment have been discussed with the patient and family. After consideration of risks, benefits and other options for treatment, the patient has consented to  Procedure(s): LESION REMOVAL (KNEE) (Left) as a surgical intervention.  The patient's history has been reviewed, patient examined, no change in status, stable for surgery.  I have reviewed the patient's chart and labs.  Questions were answered to the patient's satisfaction.     Hortencia Pilar

## 2021-08-22 NOTE — H&P (View-Only) (Signed)
MRN : [id number]  [name] is a 88 y.o. (04/20/1932) female who presents with chief complaint of legs hurt and swell.  History of Present Illness:   The patient presents to [facility] today for excision of a skin lesion.  She is also c/o a skin lesion on the left medial knee that is painful.  She has seen a general surgeon who recommended she see an orthopedic surgeon.  She notes that it has been there for quite a while at least several months.  She notes that it has not had any bleeding episodes.  Initially when she was seen in the office she felt like it had gotten larger.  Today she is saying it seems like it may have gotten slightly smaller.  Current Meds  Medication Sig   acetaminophen (TYLENOL) 325 MG tablet Take 1 tablet (325 mg total) by mouth every 6 (six) hours as needed for mild pain, fever or headache (or Fever >/= 101).   albuterol (VENTOLIN HFA) 108 (90 Base) MCG/ACT inhaler SMARTSIG:2 Inhalation Via Inhaler Every 6 Hours PRN   Biotin 5 MG TABS Take by mouth.   Calcium Carb-Cholecalciferol 600-10 MG-MCG TABS Take by mouth.   Cyanocobalamin (VITAMIN B 12 PO) Take 2,500 mcg by mouth daily.   DULoxetine (CYMBALTA) 20 MG capsule Take 20 mg by mouth daily.    GEMTESA 75 MG TABS TAKE 75 MG BY MOUTH DAILY.   levothyroxine (SYNTHROID) 112 MCG tablet TAKE 1 TABLET ONCE DAILY ON EMPTY STOMACH WITH GLASS OF WATER 30-60 MINUTES BEFORE BREAKFAST   levothyroxine (SYNTHROID) 125 MCG tablet Take by mouth.   Magnesium 250 MG TABS Take 250 mg by mouth.   Multiple Vitamins-Minerals (WOMENS 50+ MULTI VITAMIN/MIN PO) Take by mouth.   pantoprazole (PROTONIX) 40 MG tablet Take 40 mg by mouth daily.   QUEtiapine (SEROQUEL) 25 MG tablet Take 25 mg by mouth at bedtime.   rOPINIRole (REQUIP) 2 MG tablet Take 2 mg by mouth 2 (two) times daily.   spironolactone (ALDACTONE) 25 MG tablet Take 0.5 tablets (12.5 mg total) by mouth once daily   traMADol (ULTRAM)  50 MG tablet Take by mouth as needed.   Turmeric 500 MG CAPS Take 500 mg by mouth daily.    Past Medical History:  Diagnosis Date   Afib ([place])    a.) CHA2DS2-VASc = 6 (age x 2, sex, CHF, HTN, aortic plaque). b.) rate/rhythm maintained without pharmacological intervention; she is not on oral anticoagulation   Anxiety    Aortic atherosclerosis (HCC)    Arthritis    Basal cell carcinoma 11/11/2017   Right inferior knee. Superficial and nodular patterns.   Bilateral lower extremity edema    CKD (chronic kidney disease), stage III (HCC)    Congestive heart failure (CHF) ([place]) 05/31/2014   a.) TTE 05/31/2014: EF >55%, RVE; LAE; triv panvalvular regurg; G1DD. b.) TTE 06/29/2017: EF >55%; RVE; BAE; G1DD. c.) TTE 05/09/2018: EF 55-60%; LAE; mod MR, mild AR/TR. d.) TTE 04/29/2020: EF 60-65%; mi;d-mod MR/TR; RVSP 36.6 mmHg. e.) TTE 08/24/2020: EF >55%; mild LVH; mild MR/TR/PR; sev MAC.   COPD (chronic obstructive pulmonary disease) (HCC)    Coronary artery disease 06/03/2008   a.) LHC 06/03/2008: 40% pLAD-1, 60% pLAD-2, 90% mLAD-1, 30% mLAD-2, 50% mLAD-3 --> PCI placing a 2.0 x 12 mm Mini-Vision BMS x 1 to mLAD-1 lesion. b.) LHC 02/12/10: 50% pLAD, 30% ISR mLAD, 25%  dLAD, 25% pLCx, 30% mLCx, 20% pRCA, 20% D1; further intervention deferred opting for medical mgmt.   DDD (degenerative disc disease), lumbar    Depression    GERD (gastroesophageal reflux disease)    HOH (hard of hearing)    a.) uses BILATERAL hearing aids   Hurthle cell neoplasm of thyroid    a.) s/p total thyroidectomy 11/17/2019   Hyperlipemia    Hypertension    Hypothyroidism    Lumbar spinal stenosis    Memory loss    Non Hodgkin's lymphoma ([place]) 2002   Squamous cell carcinoma of skin 06/13/2020   L mid pretibia, biopsy only   Urinary incontinence     Past Surgical History:  Procedure Laterality Date   CORONARY ANGIOPLASTY WITH STENT PLACEMENT Left 06/03/2008   Procedure: CORONARY ANGIOPLASTY WITH STENT PLACEMENT;  Location: [place]; Surgeon(s): Serafina Royals, MD (diagnostic) and Katrine Coho, MD (interventional)   EXCISION OF ABDOMINAL WALL TUMOR Right 05/08/2018   Procedure: EXCISION OF ABDOMINAL WALL-RIGHT;  Surgeon: Herbert Pun, MD;  Location: [facility];  Service: General;  Laterality: Right;   INCISIONAL HERNIA REPAIR N/A 05/08/2018   Procedure: LAPAROSCOPIC VS. OPEN INCISIONAL HERNIA REPAIR WITH MESH;  Surgeon: Herbert Pun, MD;  Location: [facility];  Service: General;  Laterality: N/A;   LEFT HEART CATH AND CORONARY ANGIOGRAPHY Left 02/12/2010   Procedure: LEFT HEART CATH AND CORONARY ANGIOGRAPHY; Location: [place]; Surgeon: Serafina Royals, MD   THYROIDECTOMY Bilateral 11/17/2019   Procedure: TOTAL THYROIDECTOMY;  Surgeon: Margaretha Sheffield, MD;  Location: [facility];  Service: ENT;  Laterality: Bilateral;   TOTAL ABDOMINAL HYSTERECTOMY N/A    VEIN SURGERY     Endovenous ablation of saphenous vein    Social History Social History   Tobacco Use   Smoking status: Never   Smokeless tobacco: Never  Vaping Use   Vaping Use: Never used  Substance Use Topics   Alcohol use: No   Drug use: No    Family History Family History  Problem Relation Age of Onset   Heart Problems Mother    Heart Problems Father    Heart attack Father    Diabetes Brother    Diabetes Brother     Allergies  Allergen Reactions   Cefdinir Other (See Comments)    Tolerated cefazolin 2020   Sulfa Antibiotics Hives and Rash     REVIEW OF SYSTEMS (Negative unless checked)  Constitutional: _0 Weight loss  _1 Fever  _2 Chills Cardiac: _3 Chest pain   _4 Chest pressure   _5 Palpitations   _6 Shortness of breath when laying flat   _7 Shortness of breath with exertion. Vascular:  _8 Pain in legs with walking   _9 Pain in legs at rest  _10 History of DVT   _11 Phlebitis   _12 Swelling in legs   _13 Varicose veins   _14 Non-healing ulcers Pulmonary:   _15 Uses home oxygen   _16 Productive cough   _17 Hemoptysis   _18 Wheeze  _19 COPD    _20 Asthma Neurologic:  _21 Dizziness   _22 Seizures   _23 History of stroke   _24 History of TIA  _25 Aphasia   _26 Vissual changes   _27 Weakness or numbness in arm   _28 Weakness or numbness in leg Musculoskeletal:   _29 Joint swelling   _30 Joint pain   _31 Low back pain Hematologic:  _32 Easy bruising  _33 Easy bleeding   _34 Hypercoagulable state   _35 Anemic Gastrointestinal:  _36 Diarrhea   _37 Vomiting  _38 Gastroesophageal reflux/heartburn   _39 Difficulty swallowing. Genitourinary:  _40 Chronic kidney disease   _41 Difficult urination  _42 Frequent urination   _43 Blood in urine Skin:  _44 Rashes   _45 Ulcers  Psychological:  _46   History of anxiety   [] History of major depression.  Physical Examination  Vitals:   08/22/21 0915  BP: 133/63  Pulse: 96  Resp: 16  Temp: 98.5 F (36.9 C)  TempSrc: Temporal  SpO2: 99%  Weight: 71.7 kg  Height: 5' 1" (1.549 m)   Body mass index is 29.85 kg/m. Gen: WD/WN, NAD Head: [place]/AT, No temporalis wasting.  Ear/Nose/Throat: Hearing grossly intact, nares w/o erythema or drainage, pinna without lesions Eyes: PER, EOMI, sclera nonicteric.  Neck: Supple, no gross masses.  No JVD.  Pulmonary:  Good air movement, no audible wheezing, no use of accessory muscles.  Cardiac: RRR, precordium not hyperdynamic. Vascular: There is a 5 cm circular macular papular lesion in the medial knee that is slightly tender to palpation it is mobile scattered varicosities present bilaterally.  Moderate venous stasis changes to the legs bilaterally.  2+ soft pitting edema  Vessel Right Left  Radial Palpable Palpable  Gastrointestinal: soft, non-distended. No guarding/no peritoneal signs.  Musculoskeletal: M/S 5/5 throughout.  No deformity.  Neurologic: CN 2-12 intact. Pain and light touch intact in extremities.  Symmetrical.  Speech is fluent. Motor exam as listed above. Psychiatric: Judgment intact, Mood & affect appropriate for pt's clinical situation. Dermatologic: Venous rashes no ulcers noted.  No changes  consistent with cellulitis. Lymph : No lichenification or skin changes of chronic lymphedema.  CBC Lab Results  Component Value Date   WBC 5.5 08/10/2021   HGB 13.8 08/10/2021   HCT 41.7 08/10/2021   MCV 94.1 08/10/2021   PLT 266 08/10/2021    BMET    Component Value Date/Time   NA 140 08/10/2021 1553   NA 139 01/30/2015 0932   NA 138 06/21/2014 0503   K 3.7 08/10/2021 1553   K 3.9 06/21/2014 0503   CL 101 08/10/2021 1553   CL 107 06/21/2014 0503   CO2 30 08/10/2021 1553   CO2 27 06/21/2014 0503   GLUCOSE 104 (H) 08/10/2021 1553   GLUCOSE 100 (H) 06/21/2014 0503   BUN 34 (H) 08/10/2021 1553   BUN 17 01/30/2015 0932   BUN 21 (H) 06/21/2014 0503   CREATININE 1.39 (H) 08/10/2021 1553   CREATININE 1.22 (H) 06/21/2014 0503   CALCIUM 9.0 08/10/2021 1553   CALCIUM 8.6 (L) 06/21/2014 0503   GFRNONAA 37 (L) 08/10/2021 1553   GFRNONAA 42 (L) 06/21/2014 0503   GFRAA 43 (L) 11/05/2019 1003   GFRAA 49 (L) 06/21/2014 0503   Estimated Creatinine Clearance: 26.3 mL/min (A) (by C-G formula based on SCr of 1.39 mg/dL (H)).  COAG Lab Results  Component Value Date   INR 1.1 09/21/2019    Radiology US Abdomen Complete  Result Date: 07/24/2021 CLINICAL DATA:  Right upper quadrant abdominal pain. EXAM: ABDOMEN ULTRASOUND COMPLETE COMPARISON:  November 07, 2020.  April 28, 2020. FINDINGS: Gallbladder: No gallstones or wall thickening visualized. No sonographic Murphy sign noted by sonographer. Common bile duct: Diameter: 2 mm which is within normal limits. Liver: No focal lesion identified. Increased echogenicity of hepatic parenchyma is noted suggesting hepatic steatosis. Portal vein is patent on color Doppler imaging with normal direction of blood flow towards the liver. IVC: No abnormality visualized. Pancreas: Visualized portion unremarkable. Spleen: Size and appearance within normal limits. Right Kidney: Length: 9.8 cm. Increased echogenicity is noted suggesting medical renal disease.  No mass or hydronephrosis visualized. Left Kidney: Length: 9.3 cm. Increased echogenicity is noted suggesting medical renal disease. No mass or hydronephrosis visualized. Abdominal aorta: No aneurysm visualized. Other   findings: None. IMPRESSION: Increased echogenicity of renal parenchyma is noted bilaterally suggesting medical renal disease. No hydronephrosis or renal obstruction is noted. Increased echogenicity of hepatic parenchyma is noted suggesting hepatic steatosis. Electronically Signed   By: Marijo Conception M.D.   On: 07/24/2021 16:41     Assessment/Plan 1. Skin lesion of left lower limb Patient appears to have a skin cancer that is painful   It should be removed under local with some sedation   Risk and benefits were reviewed the patient.  Indications for the procedure were reviewed.  All questions were answered, the patient agrees to proceed.     2. Lymphedema Recommend:   No surgery or intervention at this point in time.     I have reviewed my discussion with the patient regarding lymphedema and why it  causes symptoms.  Patient will continue wearing graduated compression on a daily basis. The patient should put the compression on first thing in the morning and removing them in the evening. The patient should not sleep in the compression.    In addition, behavioral modification throughout the day will be continued.  This will include frequent elevation (such as in a recliner), use of over the counter pain medications as needed and exercise such as walking.   The systemic causes for chronic edema such as liver, kidney and cardiac etiologies does not appear to have significant changed over the past year.     The patient will continue aggressive use of the  lymph pump.  This will continue to improve the edema control and prevent sequela such as ulcers and infections.    The patient will follow-up with me on an annual basis.     3. Chronic venous insufficiency Recommend:   No  surgery or intervention at this point in time.     I have reviewed my discussion with the patient regarding lymphedema and why it  causes symptoms.  Patient will continue wearing graduated compression on a daily basis. The patient should put the compression on first thing in the morning and removing them in the evening. The patient should not sleep in the compression.    In addition, behavioral modification throughout the day will be continued.  This will include frequent elevation (such as in a recliner), use of over the counter pain medications as needed and exercise such as walking.   The systemic causes for chronic edema such as liver, kidney and cardiac etiologies does not appear to have significant changed over the past year.     The patient will continue aggressive use of the  lymph pump.  This will continue to improve the edema control and prevent sequela such as ulcers and infections.    The patient will follow-up with me on an annual basis.     4. Primary hypertension Continue antihypertensive medications as already ordered, these medications have been reviewed and there are no changes at this time.    5. Arteriosclerosis of coronary artery Continue cardiac and antihypertensive medications as already ordered and reviewed, no changes at this time.   Continue statin as ordered and reviewed, no changes at this time   Nitrates PRN for chest pain    6. Simple chronic bronchitis (HCC) Continue pulmonary medications and aerosols as already ordered, these medications have been reviewed and there are no changes at this time.     Hortencia Pilar, MD  08/22/2021 10:26 AM

## 2021-08-22 NOTE — Anesthesia Postprocedure Evaluation (Signed)
Anesthesia Post Note  Patient: [name]  Procedure(s) Performed: LESION REMOVAL (KNEE) (Left: Knee)  Patient location during evaluation: PACU Anesthesia Type: MAC Level of consciousness: awake and alert Pain management: pain level controlled Vital Signs Assessment: post-procedure vital signs reviewed and stable Respiratory status: spontaneous breathing, nonlabored ventilation, respiratory function stable and patient connected to nasal cannula oxygen Cardiovascular status: stable and blood pressure returned to baseline Postop Assessment: no apparent nausea or vomiting Anesthetic complications: no   No notable events documented.   Last Vitals:  Vitals:   08/22/21 0915 08/22/21 1115  BP: 133/63 125/65  Pulse: 96 84  Resp: 16 19  Temp: 36.9 C (!) 36.2 C  SpO2: 99% 92%    Last Pain:  Vitals:   08/22/21 0915  TempSrc: Temporal                 Arita Miss

## 2021-08-22 NOTE — Transfer of Care (Signed)
Immediate Anesthesia Transfer of Care Note  Patient: [name]  Procedure(s) Performed: LESION REMOVAL (KNEE) (Left: Knee)  Patient Location: PACU  Anesthesia Type:General  Level of Consciousness: awake  Airway & Oxygen Therapy: Patient Spontanous Breathing  Post-op Assessment: Report given to RN and Post -op Vital signs reviewed and stable  Post vital signs: Reviewed and stable  Last Vitals:  Vitals Value Taken Time  BP 125/65 08/22/21 1115  Temp 35.9 1115  Pulse 77 08/22/21 1117  Resp 20 08/22/21 1117  SpO2 93 % 08/22/21 1117  Vitals shown include unvalidated device data.  Last Pain:  Vitals:   08/22/21 0915  TempSrc: Temporal      Patients Stated Pain Goal: 0 (32/25/67 2091)  Complications: No notable events documented.

## 2021-08-22 NOTE — Op Note (Signed)
    OPERATIVE NOTE   PROCEDURE: Excision of skin lesion left knee  PRE-OPERATIVE DIAGNOSIS: Tender skin lesion left medial knee  POST-OPERATIVE DIAGNOSIS: Same  SURGEON: Hortencia Pilar  ASSISTANT(S): None  ANESTHESIA: MAC  ESTIMATED BLOOD LOSS: Less than 5 cc cc  FINDING(S): Macular papular skin lesion  SPECIMEN(S): Skin lesion sent to pathology for permanent section  INDICATIONS:   [name] is a 88 y.o. female who presents with tender lesion of the left medial knee.  It measures approximately 5 mm and is circular..  DESCRIPTION: After full informed written consent was obtained from the patient, the patient was brought back to the operating room and placed supine upon the operating table.  Prior to induction, the patient received IV antibiotics.   After obtaining adequate anesthesia, the patient was then prepped and draped in the standard fashion for a excision of the skin lesion.  The skin and soft tissues surrounding the lesion were infiltrated with half percent Marcaine with epinephrine.  After adequate sedation had been achieved and appropriate local lymph infiltration an elliptical incision was made with through the skin with a 15 blade scalpel.  The lesion was then removed en bloc with a small amount of subcutaneous tissue but there was no evidence of infiltration into the subcutaneous space.  Lesions placed in formaldehyde.  Bleeding is then controlled with Bovie cautery.  The lesion is then closed in layers using 3-0 Vicryl followed by 4-0 Monocryl subcuticular and then Dermabond.   The patient tolerated this procedure well.   COMPLICATIONS: None  CONDITION: [name] [facility]  Office: [phone]   08/22/2021, 11:20 AM

## 2021-08-22 NOTE — Progress Notes (Signed)
MRN : [id number]  [name] is a 88 y.o. (04/20/1932) female who presents with chief complaint of legs hurt and swell.  History of Present Illness:   The patient presents to [facility] today for excision of a skin lesion.  She is also c/o a skin lesion on the left medial knee that is painful.  She has seen a general surgeon who recommended she see an orthopedic surgeon.  She notes that it has been there for quite a while at least several months.  She notes that it has not had any bleeding episodes.  Initially when she was seen in the office she felt like it had gotten larger.  Today she is saying it seems like it may have gotten slightly smaller.  Current Meds  Medication Sig   acetaminophen (TYLENOL) 325 MG tablet Take 1 tablet (325 mg total) by mouth every 6 (six) hours as needed for mild pain, fever or headache (or Fever >/= 101).   albuterol (VENTOLIN HFA) 108 (90 Base) MCG/ACT inhaler SMARTSIG:2 Inhalation Via Inhaler Every 6 Hours PRN   Biotin 5 MG TABS Take by mouth.   Calcium Carb-Cholecalciferol 600-10 MG-MCG TABS Take by mouth.   Cyanocobalamin (VITAMIN B 12 PO) Take 2,500 mcg by mouth daily.   DULoxetine (CYMBALTA) 20 MG capsule Take 20 mg by mouth daily.    GEMTESA 75 MG TABS TAKE 75 MG BY MOUTH DAILY.   levothyroxine (SYNTHROID) 112 MCG tablet TAKE 1 TABLET ONCE DAILY ON EMPTY STOMACH WITH GLASS OF WATER 30-60 MINUTES BEFORE BREAKFAST   levothyroxine (SYNTHROID) 125 MCG tablet Take by mouth.   Magnesium 250 MG TABS Take 250 mg by mouth.   Multiple Vitamins-Minerals (WOMENS 50+ MULTI VITAMIN/MIN PO) Take by mouth.   pantoprazole (PROTONIX) 40 MG tablet Take 40 mg by mouth daily.   QUEtiapine (SEROQUEL) 25 MG tablet Take 25 mg by mouth at bedtime.   rOPINIRole (REQUIP) 2 MG tablet Take 2 mg by mouth 2 (two) times daily.   spironolactone (ALDACTONE) 25 MG tablet Take 0.5 tablets (12.5 mg total) by mouth once daily   traMADol (ULTRAM)  50 MG tablet Take by mouth as needed.   Turmeric 500 MG CAPS Take 500 mg by mouth daily.    Past Medical History:  Diagnosis Date   Afib ([place])    a.) CHA2DS2-VASc = 6 (age x 2, sex, CHF, HTN, aortic plaque). b.) rate/rhythm maintained without pharmacological intervention; she is not on oral anticoagulation   Anxiety    Aortic atherosclerosis (HCC)    Arthritis    Basal cell carcinoma 11/11/2017   Right inferior knee. Superficial and nodular patterns.   Bilateral lower extremity edema    CKD (chronic kidney disease), stage III (HCC)    Congestive heart failure (CHF) ([place]) 05/31/2014   a.) TTE 05/31/2014: EF >55%, RVE; LAE; triv panvalvular regurg; G1DD. b.) TTE 06/29/2017: EF >55%; RVE; BAE; G1DD. c.) TTE 05/09/2018: EF 55-60%; LAE; mod MR, mild AR/TR. d.) TTE 04/29/2020: EF 60-65%; mi;d-mod MR/TR; RVSP 36.6 mmHg. e.) TTE 08/24/2020: EF >55%; mild LVH; mild MR/TR/PR; sev MAC.   COPD (chronic obstructive pulmonary disease) (HCC)    Coronary artery disease 06/03/2008   a.) LHC 06/03/2008: 40% pLAD-1, 60% pLAD-2, 90% mLAD-1, 30% mLAD-2, 50% mLAD-3 --> PCI placing a 2.0 x 12 mm Mini-Vision BMS x 1 to mLAD-1 lesion. b.) LHC 02/12/10: 50% pLAD, 30% ISR mLAD, 25%  dLAD, 25% pLCx, 30% mLCx, 20% pRCA, 20% D1; further intervention deferred opting for medical mgmt.   DDD (degenerative disc disease), lumbar    Depression    GERD (gastroesophageal reflux disease)    HOH (hard of hearing)    a.) uses BILATERAL hearing aids   Hurthle cell neoplasm of thyroid    a.) s/p total thyroidectomy 11/17/2019   Hyperlipemia    Hypertension    Hypothyroidism    Lumbar spinal stenosis    Memory loss    Non Hodgkin's lymphoma ([place]) 2002   Squamous cell carcinoma of skin 06/13/2020   L mid pretibia, biopsy only   Urinary incontinence     Past Surgical History:  Procedure Laterality Date   CORONARY ANGIOPLASTY WITH STENT PLACEMENT Left 06/03/2008   Procedure: CORONARY ANGIOPLASTY WITH STENT PLACEMENT;  Location: [place]; Surgeon(s): Serafina Royals, MD (diagnostic) and Katrine Coho, MD (interventional)   EXCISION OF ABDOMINAL WALL TUMOR Right 05/08/2018   Procedure: EXCISION OF ABDOMINAL WALL-RIGHT;  Surgeon: Herbert Pun, MD;  Location: [facility];  Service: General;  Laterality: Right;   INCISIONAL HERNIA REPAIR N/A 05/08/2018   Procedure: LAPAROSCOPIC VS. OPEN INCISIONAL HERNIA REPAIR WITH MESH;  Surgeon: Herbert Pun, MD;  Location: [facility];  Service: General;  Laterality: N/A;   LEFT HEART CATH AND CORONARY ANGIOGRAPHY Left 02/12/2010   Procedure: LEFT HEART CATH AND CORONARY ANGIOGRAPHY; Location: [place]; Surgeon: Serafina Royals, MD   THYROIDECTOMY Bilateral 11/17/2019   Procedure: TOTAL THYROIDECTOMY;  Surgeon: Margaretha Sheffield, MD;  Location: [facility];  Service: ENT;  Laterality: Bilateral;   TOTAL ABDOMINAL HYSTERECTOMY N/A    VEIN SURGERY     Endovenous ablation of saphenous vein    Social History Social History   Tobacco Use   Smoking status: Never   Smokeless tobacco: Never  Vaping Use   Vaping Use: Never used  Substance Use Topics   Alcohol use: No   Drug use: No    Family History Family History  Problem Relation Age of Onset   Heart Problems Mother    Heart Problems Father    Heart attack Father    Diabetes Brother    Diabetes Brother     Allergies  Allergen Reactions   Cefdinir Other (See Comments)    Tolerated cefazolin 2020   Sulfa Antibiotics Hives and Rash     REVIEW OF SYSTEMS (Negative unless checked)  Constitutional: _0 Weight loss  _1 Fever  _2 Chills Cardiac: _3 Chest pain   _4 Chest pressure   _5 Palpitations   _6 Shortness of breath when laying flat   _7 Shortness of breath with exertion. Vascular:  _8 Pain in legs with walking   _9 Pain in legs at rest  _10 History of DVT   _11 Phlebitis   _12 Swelling in legs   _13 Varicose veins   _14 Non-healing ulcers Pulmonary:   _15 Uses home oxygen   _16 Productive cough   _17 Hemoptysis   _18 Wheeze  _19 COPD    _20 Asthma Neurologic:  _21 Dizziness   _22 Seizures   _23 History of stroke   _24 History of TIA  _25 Aphasia   _26 Vissual changes   _27 Weakness or numbness in arm   _28 Weakness or numbness in leg Musculoskeletal:   _29 Joint swelling   _30 Joint pain   _31 Low back pain Hematologic:  _32 Easy bruising  _33 Easy bleeding   _34 Hypercoagulable state   _35 Anemic Gastrointestinal:  _36 Diarrhea   _37 Vomiting  _38 Gastroesophageal reflux/heartburn   _39 Difficulty swallowing. Genitourinary:  _40 Chronic kidney disease   _41 Difficult urination  _42 Frequent urination   _43 Blood in urine Skin:  _44 Rashes   _45 Ulcers  Psychological:  _46   History of anxiety   _0  History of major depression.  Physical Examination  Vitals:   08/22/21 0915  BP: 133/63  Pulse: 96  Resp: 16  Temp: 98.5 F (36.9 C)  TempSrc: Temporal  SpO2: 99%  Weight: 71.7 kg  Height: _1  (1.549 m)   Body mass index is 29.85 kg/m. Gen: WD/WN, NAD Head: [place]/AT, No temporalis wasting.  Ear/Nose/Throat: Hearing grossly intact, nares w/o erythema or drainage, pinna without lesions Eyes: PER, EOMI, sclera nonicteric.  Neck: Supple, no gross masses.  No JVD.  Pulmonary:  Good air movement, no audible wheezing, no use of accessory muscles.  Cardiac: RRR, precordium not hyperdynamic. Vascular: There is a 5 cm circular macular papular lesion in the medial knee that is slightly tender to palpation it is mobile scattered varicosities present bilaterally.  Moderate venous stasis changes to the legs bilaterally.  2+ soft pitting edema  Vessel Right Left  Radial Palpable Palpable  Gastrointestinal: soft, non-distended. No guarding/no peritoneal signs.  Musculoskeletal: M/S 5/5 throughout.  No deformity.  Neurologic: CN 2-12 intact. Pain and light touch intact in extremities.  Symmetrical.  Speech is fluent. Motor exam as listed above. Psychiatric: Judgment intact, Mood & affect appropriate for pt's clinical situation. Dermatologic: Venous rashes no ulcers noted.  No changes  consistent with cellulitis. Lymph : No lichenification or skin changes of chronic lymphedema.  CBC Lab Results  Component Value Date   WBC 5.5 08/10/2021   HGB 13.8 08/10/2021   HCT 41.7 08/10/2021   MCV 94.1 08/10/2021   PLT 266 08/10/2021    BMET    Component Value Date/Time   NA 140 08/10/2021 1553   NA 139 01/30/2015 0932   NA 138 06/21/2014 0503   K 3.7 08/10/2021 1553   K 3.9 06/21/2014 0503   CL 101 08/10/2021 1553   CL 107 06/21/2014 0503   CO2 30 08/10/2021 1553   CO2 27 06/21/2014 0503   GLUCOSE 104 (H) 08/10/2021 1553   GLUCOSE 100 (H) 06/21/2014 0503   BUN 34 (H) 08/10/2021 1553   BUN 17 01/30/2015 0932   BUN 21 (H) 06/21/2014 0503   CREATININE 1.39 (H) 08/10/2021 1553   CREATININE 1.22 (H) 06/21/2014 0503   CALCIUM 9.0 08/10/2021 1553   CALCIUM 8.6 (L) 06/21/2014 0503   GFRNONAA 37 (L) 08/10/2021 1553   GFRNONAA 42 (L) 06/21/2014 0503   GFRAA 43 (L) 11/05/2019 1003   GFRAA 49 (L) 06/21/2014 0503   Estimated Creatinine Clearance: 26.3 mL/min (A) (by C-G formula based on SCr of 1.39 mg/dL (H)).  COAG Lab Results  Component Value Date   INR 1.1 09/21/2019    Radiology US Abdomen Complete  Result Date: 07/24/2021 CLINICAL DATA:  Right upper quadrant abdominal pain. EXAM: ABDOMEN ULTRASOUND COMPLETE COMPARISON:  November 07, 2020.  April 28, 2020. FINDINGS: Gallbladder: No gallstones or wall thickening visualized. No sonographic Murphy sign noted by sonographer. Common bile duct: Diameter: 2 mm which is within normal limits. Liver: No focal lesion identified. Increased echogenicity of hepatic parenchyma is noted suggesting hepatic steatosis. Portal vein is patent on color Doppler imaging with normal direction of blood flow towards the liver. IVC: No abnormality visualized. Pancreas: Visualized portion unremarkable. Spleen: Size and appearance within normal limits. Right Kidney: Length: 9.8 cm. Increased echogenicity is noted suggesting medical renal disease.  No mass or hydronephrosis visualized. Left Kidney: Length: 9.3 cm. Increased echogenicity is noted suggesting medical renal disease. No mass or hydronephrosis visualized. Abdominal aorta: No aneurysm visualized. Other  findings: None. IMPRESSION: Increased echogenicity of renal parenchyma is noted bilaterally suggesting medical renal disease. No hydronephrosis or renal obstruction is noted. Increased echogenicity of hepatic parenchyma is noted suggesting hepatic steatosis. Electronically Signed   By: Marijo Conception M.D.   On: 07/24/2021 16:41     Assessment/Plan 1. Skin lesion of left lower limb Patient appears to have a skin cancer that is painful   It should be removed under local with some sedation   Risk and benefits were reviewed the patient.  Indications for the procedure were reviewed.  All questions were answered, the patient agrees to proceed.     2. Lymphedema Recommend:   No surgery or intervention at this point in time.     I have reviewed my discussion with the patient regarding lymphedema and why it  causes symptoms.  Patient will continue wearing graduated compression on a daily basis. The patient should put the compression on first thing in the morning and removing them in the evening. The patient should not sleep in the compression.    In addition, behavioral modification throughout the day will be continued.  This will include frequent elevation (such as in a recliner), use of over the counter pain medications as needed and exercise such as walking.   The systemic causes for chronic edema such as liver, kidney and cardiac etiologies does not appear to have significant changed over the past year.     The patient will continue aggressive use of the  lymph pump.  This will continue to improve the edema control and prevent sequela such as ulcers and infections.    The patient will follow-up with me on an annual basis.     3. Chronic venous insufficiency Recommend:   No  surgery or intervention at this point in time.     I have reviewed my discussion with the patient regarding lymphedema and why it  causes symptoms.  Patient will continue wearing graduated compression on a daily basis. The patient should put the compression on first thing in the morning and removing them in the evening. The patient should not sleep in the compression.    In addition, behavioral modification throughout the day will be continued.  This will include frequent elevation (such as in a recliner), use of over the counter pain medications as needed and exercise such as walking.   The systemic causes for chronic edema such as liver, kidney and cardiac etiologies does not appear to have significant changed over the past year.     The patient will continue aggressive use of the  lymph pump.  This will continue to improve the edema control and prevent sequela such as ulcers and infections.    The patient will follow-up with me on an annual basis.     4. Primary hypertension Continue antihypertensive medications as already ordered, these medications have been reviewed and there are no changes at this time.    5. Arteriosclerosis of coronary artery Continue cardiac and antihypertensive medications as already ordered and reviewed, no changes at this time.   Continue statin as ordered and reviewed, no changes at this time   Nitrates PRN for chest pain    6. Simple chronic bronchitis (HCC) Continue pulmonary medications and aerosols as already ordered, these medications have been reviewed and there are no changes at this time.     Hortencia Pilar, MD  08/22/2021 10:26 AM

## 2021-08-23 LAB — SURGICAL PATHOLOGY

## 2021-09-06 ENCOUNTER — Ambulatory Visit: Payer: Medicare Other | Admitting: Physician Assistant

## 2021-09-17 ENCOUNTER — Ambulatory Visit (INDEPENDENT_AMBULATORY_CARE_PROVIDER_SITE_OTHER): Payer: Medicare Other | Admitting: Physician Assistant

## 2021-09-17 DIAGNOSIS — N3941 Urge incontinence: Secondary | ICD-10-CM | POA: Diagnosis not present

## 2021-09-17 NOTE — Progress Notes (Signed)
PTNS  Session # monthly maintenance 14/45  Health & Social Factors: no change Caffeine: 1  Alcohol: 0 Daytime voids #per day: 5-6 Night-time voids #per night: 3-4 Urgency: moderate Incontinence Episodes #per day: 3-4 Ankle used: Right Treatment Setting: 5 Feeling/ Response: both Comments: patient tolerated well  Performed By: Fonnie Jarvis, CMA  Follow Up:  1 month PTNS

## 2021-09-17 NOTE — Patient Instructions (Signed)
Tracking Your Bladder Symptoms    Patient Name:___________________________________________________   Sample: Day   Daytime Voids  Nighttime Voids Urgency for the Day(0-4) Number of Accidents Beverage Comments  Monday IIII II 2 I Water IIII Coffee  I      Week Starting:____________________________________   Day Daytime  Voids Nighttime  Voids Urgency for  The Day(0-4) Number of Accidents Beverages Comments                                                           This week my symptoms were:  O much better  O better O the same O worse   

## 2021-10-15 ENCOUNTER — Ambulatory Visit (INDEPENDENT_AMBULATORY_CARE_PROVIDER_SITE_OTHER): Payer: Medicare Other | Admitting: Physician Assistant

## 2021-10-15 DIAGNOSIS — N3941 Urge incontinence: Secondary | ICD-10-CM

## 2021-10-15 NOTE — Progress Notes (Signed)
Patient ID: [name], female   DOB: 1932/06/01, 88 y.o.   MRN: [id number] PTNS  Session # 15 of 45  Health & Social Factors: no change Caffeine: 0 Alcohol: 0 Daytime voids #per day: 8 Night-time voids #per night: 4 Urgency: mild Incontinence Episodes #per day: 0 Ankle used: right Treatment Setting: 5 Feeling/ Response: sensory Comments: pt tolerated well  Performed By: Edwin Dada, CMA, (AAMA)  Follow Up: 8mh follow-up for #16 of 45

## 2021-11-12 ENCOUNTER — Ambulatory Visit (INDEPENDENT_AMBULATORY_CARE_PROVIDER_SITE_OTHER): Payer: Medicare Other | Admitting: Physician Assistant

## 2021-11-12 DIAGNOSIS — N3941 Urge incontinence: Secondary | ICD-10-CM

## 2021-11-12 NOTE — Progress Notes (Signed)
PTNS  Session # 16 of 45  Health & Social Factors: no changes Caffeine: 0 Alcohol: 0 Daytime voids #per day: 5-6  Night-time voids #per night: 5-6 Urgency: mild Incontinence Episodes #per day: 3 Ankle used: right Treatment Setting: 3 Feeling/ Response: senory Comments: pt tolerated well   Performed By: Breck Coons   Follow Up: 1 month follow up for 15 of 45

## 2021-11-20 ENCOUNTER — Other Ambulatory Visit (INDEPENDENT_AMBULATORY_CARE_PROVIDER_SITE_OTHER): Payer: Self-pay | Admitting: Vascular Surgery

## 2021-11-20 DIAGNOSIS — M79606 Pain in leg, unspecified: Secondary | ICD-10-CM

## 2021-11-21 ENCOUNTER — Ambulatory Visit (INDEPENDENT_AMBULATORY_CARE_PROVIDER_SITE_OTHER): Payer: Medicare Other

## 2021-11-21 DIAGNOSIS — M79606 Pain in leg, unspecified: Secondary | ICD-10-CM | POA: Diagnosis not present

## 2021-11-28 DIAGNOSIS — C44729 Squamous cell carcinoma of skin of left lower limb, including hip: Secondary | ICD-10-CM | POA: Insufficient documentation

## 2021-11-28 NOTE — Progress Notes (Signed)
MRN : [id number]  [name] is a 88 y.o. (1933/01/28) female who presents with chief complaint of legs swell.  History of Present Illness:   Procedure 08/22/2021: Excision of skin lesion left knee  DIAGNOSIS:  A.  SKIN, RIGHT KNEE, LESION; EXCISION:  - WELL DIFFERENTIATED SQUAMOUS CELL CARCINOMA.  - SURGICAL MARGINS NEGATIVE FOR MALIGNANCY.   The patient returns for followup evaluation many months after the initial visit. The patient continues to have pain in the lower extremities with dependency. The pain is lessened with elevation. Graduated compression stockings, Class I (20-30 mmHg), have been worn but the stockings do not eliminate the leg pain. Over-the-counter analgesics do not improve the symptoms. The degree of discomfort continues to interfere with daily activities. The patient notes the pain in the legs is causing problems with daily exercise, at the workplace and even with household activities and maintenance such as standing in the kitchen preparing meals and doing dishes.   Venous ultrasound shows normal deep venous system, no evidence of acute or chronic DVT.  Superficial reflux is present in the left great saphenous vein.   No outpatient medications have been marked as taking for the 11/29/21 encounter (Appointment) with Delana Meyer, Dolores Lory, MD.    Past Medical History:  Diagnosis Date   [facility])    a.) CHA2DS2-VASc = 6 (age x 2, sex, CHF, HTN, aortic plaque). b.) rate/rhythm maintained without pharmacological intervention; she is not on oral anticoagulation   Anxiety    Aortic atherosclerosis (HCC)    Arthritis    Basal cell carcinoma 11/11/2017   Right inferior knee. Superficial and nodular patterns.   Bilateral lower extremity edema    CKD (chronic kidney disease), stage III (HCC)    Congestive heart failure (CHF) ([place]) 05/31/2014   a.) TTE 05/31/2014: EF >55%, RVE; LAE; triv panvalvular regurg; G1DD. b.) TTE 06/29/2017: EF >55%; RVE; BAE; G1DD. c.) TTE  05/09/2018: EF 55-60%; LAE; mod MR, mild AR/TR. d.) TTE 04/29/2020: EF 60-65%; mi;d-mod MR/TR; RVSP 36.6 mmHg. e.) TTE 08/24/2020: EF >55%; mild LVH; mild MR/TR/PR; sev MAC.   COPD (chronic obstructive pulmonary disease) (HCC)    Coronary artery disease 06/03/2008   a.) LHC 06/03/2008: 40% pLAD-1, 60% pLAD-2, 90% mLAD-1, 30% mLAD-2, 50% mLAD-3 --> PCI placing a 2.0 x 12 mm Mini-Vision BMS x 1 to mLAD-1 lesion. b.) LHC 02/12/10: 50% pLAD, 30% ISR mLAD, 25% dLAD, 25% pLCx, 30% mLCx, 20% pRCA, 20% D1; further intervention deferred opting for medical mgmt.   DDD (degenerative disc disease), lumbar    Depression    GERD (gastroesophageal reflux disease)    HOH (hard of hearing)    a.) uses BILATERAL hearing aids   Hurthle cell neoplasm of thyroid    a.) s/p total thyroidectomy 11/17/2019   Hyperlipemia    Hypertension    Hypothyroidism    Lumbar spinal stenosis    Memory loss    Non Hodgkin's lymphoma ([place]) 2002   Squamous cell carcinoma of skin 06/13/2020   L mid pretibia, biopsy only   Urinary incontinence     Past Surgical History:  Procedure Laterality Date   CORONARY ANGIOPLASTY WITH STENT PLACEMENT Left 06/03/2008   Procedure: CORONARY ANGIOPLASTY WITH STENT PLACEMENT; Location: [facility]; Surgeon(s): Serafina Royals, MD (diagnostic) and Katrine Coho, MD (interventional)   EXCISION OF ABDOMINAL WALL TUMOR Right 05/08/2018   Procedure: EXCISION OF ABDOMINAL WALL-RIGHT;  Surgeon: Herbert Pun, MD;  Location: [facility];  Service: General;  Laterality: Right;  INCISIONAL HERNIA REPAIR N/A 05/08/2018   Procedure: LAPAROSCOPIC VS. OPEN INCISIONAL HERNIA REPAIR WITH MESH;  Surgeon: Herbert Pun, MD;  Location: [facility];  Service: General;  Laterality: N/A;   LEFT HEART CATH AND CORONARY ANGIOGRAPHY Left 02/12/2010   Procedure: LEFT HEART CATH AND CORONARY ANGIOGRAPHY; Location: [facility]; Surgeon: Serafina Royals, MD   LESION REMOVAL Left 08/22/2021   Procedure: LESION REMOVAL  (KNEE);  Surgeon: Katha Cabal, MD;  Location: [facility];  Service: Vascular;  Laterality: Left;   THYROIDECTOMY Bilateral 11/17/2019   Procedure: TOTAL THYROIDECTOMY;  Surgeon: Margaretha Sheffield, MD;  Location: [facility];  Service: ENT;  Laterality: Bilateral;   TOTAL ABDOMINAL HYSTERECTOMY N/A    VEIN SURGERY     Endovenous ablation of saphenous vein    Social History Social History   Tobacco Use   Smoking status: Never   Smokeless tobacco: Never  Vaping Use   Vaping Use: Never used  Substance Use Topics   Alcohol use: No   Drug use: No    Family History Family History  Problem Relation Age of Onset   Heart Problems Mother    Heart Problems Father    Heart attack Father    Diabetes Brother    Diabetes Brother     Allergies  Allergen Reactions   Cefdinir Other (See Comments)    Tolerated cefazolin 2020   Sulfa Antibiotics Hives and Rash     REVIEW OF SYSTEMS (Negative unless checked)  Constitutional: _0 Weight loss  _1 Fever  _2 Chills Cardiac: _3 Chest pain   _4 Chest pressure   _5 Palpitations   _6 Shortness of breath when laying flat   _7 Shortness of breath with exertion. Vascular:  _8 Pain in legs with walking   _9 Pain in legs with standing  _10 History of DVT   _11 Phlebitis   _12 Swelling in legs   _13 Varicose veins   _14 Non-healing ulcers Pulmonary:   _15 Uses home oxygen   _16 Productive cough   _17 Hemoptysis   _18 Wheeze  _19 COPD   _20 Asthma Neurologic:  _21 Dizziness   _22 Seizures   _23 History of stroke   _24 History of TIA  _25 Aphasia   _26 Vissual changes   _27 Weakness or numbness in arm   _28 Weakness or numbness in leg Musculoskeletal:   _29 Joint swelling   _30 Joint pain   _31 Low back pain Hematologic:  _32 Easy bruising  _33 Easy bleeding   _34 Hypercoagulable state   _35 Anemic Gastrointestinal:  _36 Diarrhea   _37 Vomiting  _38 Gastroesophageal reflux/heartburn   _39 Difficulty swallowing. Genitourinary:  _40 Chronic kidney disease   _41 Difficult urination  _42 Frequent urination   _43 Blood in urine Skin:   _44 Rashes   _45 Ulcers  Psychological:  _46 History of anxiety   _47  History of major depression.  Physical Examination  There were no vitals filed for this visit. There is no height or weight on file to calculate BMI. Gen: WD/WN, NAD Head: [place]/AT, No temporalis wasting.  Ear/Nose/Throat: Hearing grossly intact, nares w/o erythema or drainage, pinna without lesions Eyes: PER, EOMI, sclera nonicteric.  Neck: Supple, no gross masses.  No JVD.  Pulmonary:  Good air movement, no audible wheezing, no use of accessory muscles.  Cardiac: RRR, precordium not hyperdynamic. Vascular:  large varicosities present left leg >10 mm.  Moderate venous stasis changes to the legs bilaterally.  3+ soft pitting edema. CEAP 4s  Vessel Right Left  Radial Palpable Palpable  Gastrointestinal: soft, non-distended. No guarding/no peritoneal signs.  Musculoskeletal: M/S 5/5 throughout.  No deformity.  Neurologic: CN 2-12 intact. Pain and light touch intact in extremities.  Symmetrical.  Speech is fluent. Motor exam  as listed above. Psychiatric: Judgment intact, Mood & affect appropriate for pt's clinical situation. Dermatologic: Venous rashes no ulcers noted.  No changes consistent with cellulitis. Lymph : No lichenification or skin changes of chronic lymphedema.  CBC Lab Results  Component Value Date   WBC 5.5 08/10/2021   HGB 13.8 08/10/2021   HCT 41.7 08/10/2021   MCV 94.1 08/10/2021   PLT 266 08/10/2021    BMET    Component Value Date/Time   NA 140 08/10/2021 1553   NA 139 01/30/2015 0932   NA 138 06/21/2014 0503   K 3.7 08/10/2021 1553   K 3.9 06/21/2014 0503   CL 101 08/10/2021 1553   CL 107 06/21/2014 0503   CO2 30 08/10/2021 1553   CO2 27 06/21/2014 0503   GLUCOSE 104 (H) 08/10/2021 1553   GLUCOSE 100 (H) 06/21/2014 0503   BUN 34 (H) 08/10/2021 1553   BUN 17 01/30/2015 0932   BUN 21 (H) 06/21/2014 0503   CREATININE 1.39 (H) 08/10/2021 1553   CREATININE 1.22 (H) 06/21/2014 0503   CALCIUM 9.0  08/10/2021 1553   CALCIUM 8.6 (L) 06/21/2014 0503   GFRNONAA 37 (L) 08/10/2021 1553   GFRNONAA 42 (L) 06/21/2014 0503   GFRAA 43 (L) 11/05/2019 1003   GFRAA 49 (L) 06/21/2014 0503   CrCl cannot be calculated (Patient's most recent lab result is older than the maximum 21 days allowed.).  COAG Lab Results  Component Value Date   INR 1.1 09/21/2019    Radiology VAS [place] LOWER EXTREMITY VENOUS REFLUX  Result Date: 11/22/2021  Lower Venous Reflux Study Patient Name:  [name]  Date of Exam:   11/21/2021 Medical Rec #: [id number]       Accession #:    [id number] Date of Birth: 21-Oct-1932       Patient Gender: F Patient Age:   44 years Exam Location:  [facility] Procedure:      VAS [place] LOWER EXTREMITY VENOUS REFLUX Referring Phys: Hortencia Pilar --------------------------------------------------------------------------------  Indications: Varicosities, and Swelling.  Performing Technologist: Almira Coaster RVS  Examination Guidelines: A complete evaluation includes B-mode imaging, spectral Doppler, color Doppler, and power Doppler as needed of all accessible portions of each vessel. Bilateral testing is considered an integral part of a complete examination. Limited examinations for reoccurring indications may be performed as noted. The reflux portion of the exam is performed with the patient in reverse Trendelenburg. Significant venous reflux is defined as >500 ms in the superficial venous system, and >1 second in the deep venous system.  Venous Reflux Times +--------------+---------+------+-----------+------------+--------+ RIGHT         Reflux NoRefluxReflux TimeDiameter cmsComments                         Yes                                  +--------------+---------+------+-----------+------------+--------+ CFV           no                                             +--------------+---------+------+-----------+------------+--------+ FV prox       no                                              +--------------+---------+------+-----------+------------+--------+  FV mid        no                                             +--------------+---------+------+-----------+------------+--------+ FV dist       no                                             +--------------+---------+------+-----------+------------+--------+ Popliteal     no                                             +--------------+---------+------+-----------+------------+--------+ GSV at SFJ    no                            .70              +--------------+---------+------+-----------+------------+--------+ GSV prox thighno                            .68              +--------------+---------+------+-----------+------------+--------+ GSV mid thigh no                            .38              +--------------+---------+------+-----------+------------+--------+ GSV dist thighno                            .32              +--------------+---------+------+-----------+------------+--------+ GSV at knee   no                            .42              +--------------+---------+------+-----------+------------+--------+ GSV prox calf no                            .33              +--------------+---------+------+-----------+------------+--------+ SSV Pop Fossa no                            .30              +--------------+---------+------+-----------+------------+--------+  +--------------+---------+------+-----------+------------+--------+ LEFT          Reflux NoRefluxReflux TimeDiameter cmsComments                         Yes                                  +--------------+---------+------+-----------+------------+--------+ CFV           no                                             +--------------+---------+------+-----------+------------+--------+  FV prox       no                                              +--------------+---------+------+-----------+------------+--------+ FV mid        no                                             +--------------+---------+------+-----------+------------+--------+ FV dist       no                                             +--------------+---------+------+-----------+------------+--------+ Popliteal     no                                             +--------------+---------+------+-----------+------------+--------+ GSV at SFJ    no                            .78              +--------------+---------+------+-----------+------------+--------+ GSV prox thigh          yes    1372 ms      .52              +--------------+---------+------+-----------+------------+--------+ GSV mid thigh no                                    NWV      +--------------+---------+------+-----------+------------+--------+ GSV dist thighno                            .25              +--------------+---------+------+-----------+------------+--------+ GSV at knee   no                                             +--------------+---------+------+-----------+------------+--------+ GSV prox calf no                                             +--------------+---------+------+-----------+------------+--------+   Summary: Bilateral: - No evidence of deep vein thrombosis seen in the lower extremities, bilaterally, from the common femoral through the popliteal veins. - No evidence of superficial venous thrombosis in the lower extremities, bilaterally. - No evidence of deep venous insufficiency seen bilaterally in the lower extremity.  Right: - No evidence of superficial venous reflux seen in the right greater saphenous vein. - There appears to be an Enlarged Lymph Node Vascularized in Nature in the Right Groin area.  Left: - Venous reflux is noted in the left greater saphenous vein in the calf. - Reflux seen in a Varicose Vein Proximal  Calf area. There  appears to be an Enlarged Lymph Node Vascularized in Nature in the Left Groin area.  *See table(s) above for measurements and observations. Electronically signed by Leotis Pain MD on 11/22/2021 at 2:08:19 PM.    Final      Assessment/Plan 1. Varicose veins with pain Recommend  I have reviewed my previous  discussion with the patient regarding  varicose veins and why they cause symptoms. Patient will continue  wearing graduated compression stockings class 1 on a daily basis, beginning first thing in the morning and removing them in the evening.    In addition, behavioral modification including elevation during the day was again discussed and this will continue.  The patient has utilized over the counter pain medications and has been exercising.  However, at this time conservative therapy has not alleviated the patient's symptoms of leg pain and swelling  Recommend: laser ablation of the left great saphenous vein to eliminate the symptoms of pain and swelling of the lower extremities caused by the severe superficial venous reflux disease.    2. Lymphedema Recommend:  No surgery or intervention at this point in time.    I have reviewed my discussion with the patient regarding lymphedema and why it  causes symptoms.  Patient will continue wearing graduated compression on a daily basis. The patient should put the compression on first thing in the morning and removing them in the evening. The patient should not sleep in the compression.   In addition, behavioral modification throughout the day will be continued.  This will include frequent elevation (such as in a recliner), use of over the counter pain medications as needed and exercise such as walking.  The systemic causes for chronic edema such as liver, kidney and cardiac etiologies does not appear to have significant changed over the past year.    The patient will continue aggressive use of the  lymph pump.  This will continue to improve the  edema control and prevent sequela such as ulcers and infections.   The patient will follow-up with me on an annual basis.    3. Squamous cell carcinoma of leg, left Margins were clear wound is healed  4. Coronary artery disease involving native heart without angina pectoris, unspecified vessel or lesion type Continue cardiac and antihypertensive medications as already ordered and reviewed, no changes at this time.  Continue statin as ordered and reviewed, no changes at this time  Nitrates PRN for chest pain   5. Primary hypertension Continue antihypertensive medications as already ordered, these medications have been reviewed and there are no changes at this time.   6. Arthritis of lumbar spine Continue NSAID medications as already ordered, these medications have been reviewed and there are no changes at this time.  Continued activity and therapy was stressed.     Hortencia Pilar, MD  11/28/2021 3:58 PM

## 2021-11-29 ENCOUNTER — Encounter (INDEPENDENT_AMBULATORY_CARE_PROVIDER_SITE_OTHER): Payer: Self-pay | Admitting: Vascular Surgery

## 2021-11-29 ENCOUNTER — Ambulatory Visit (INDEPENDENT_AMBULATORY_CARE_PROVIDER_SITE_OTHER): Payer: Medicare Other | Admitting: Vascular Surgery

## 2021-11-29 VITALS — BP 124/75 | HR 87 | Resp 16 | Wt 157.6 lb

## 2021-11-29 DIAGNOSIS — I83819 Varicose veins of unspecified lower extremities with pain: Secondary | ICD-10-CM | POA: Diagnosis not present

## 2021-11-29 DIAGNOSIS — C44729 Squamous cell carcinoma of skin of left lower limb, including hip: Secondary | ICD-10-CM

## 2021-11-29 DIAGNOSIS — M47816 Spondylosis without myelopathy or radiculopathy, lumbar region: Secondary | ICD-10-CM

## 2021-11-29 DIAGNOSIS — I2510 Atherosclerotic heart disease of native coronary artery without angina pectoris: Secondary | ICD-10-CM | POA: Diagnosis not present

## 2021-11-29 DIAGNOSIS — I890 Lymphedema, not elsewhere classified: Secondary | ICD-10-CM | POA: Diagnosis not present

## 2021-11-29 DIAGNOSIS — I10 Essential (primary) hypertension: Secondary | ICD-10-CM

## 2021-11-30 ENCOUNTER — Encounter (INDEPENDENT_AMBULATORY_CARE_PROVIDER_SITE_OTHER): Payer: Self-pay | Admitting: Vascular Surgery

## 2021-12-07 ENCOUNTER — Other Ambulatory Visit: Payer: Self-pay | Admitting: Urology

## 2021-12-12 ENCOUNTER — Ambulatory Visit: Payer: Medicare Other | Admitting: Physician Assistant

## 2021-12-17 ENCOUNTER — Ambulatory Visit (INDEPENDENT_AMBULATORY_CARE_PROVIDER_SITE_OTHER): Payer: Medicare Other | Admitting: Urology

## 2021-12-17 DIAGNOSIS — N3946 Mixed incontinence: Secondary | ICD-10-CM

## 2021-12-17 DIAGNOSIS — N3941 Urge incontinence: Secondary | ICD-10-CM

## 2021-12-17 NOTE — Progress Notes (Signed)
PTNS   Session # 17 of 45   Health & Social Factors: no changes Caffeine: 0 Alcohol: 0 Daytime voids #per day: 5-6    Night-time voids #per night: 5-6 Urgency: mild Incontinence Episodes #per day: 3 Ankle used: right Treatment Setting: 5 Feeling/ Response: senory Comments: pt tolerated well    Performed By: August Luz    Follow Up: 1 month follow up for 18 of 45

## 2021-12-22 IMAGING — US US FNA BIOPSY THYROID 1ST LESION
1 series · 14 of 16 positions shown · non-contrast
Comparison: PET-CT 10/05/2019, ultrasound 12/08/2017

MEDICATIONS:
None

COMPLICATIONS:
None

INDICATION: 86-year-old female with a history of left-sided thyroid nodule with
prior biopsy performed 12/30/2017, and positive results for Hurthle
cell. She presents for repeat biopsy

EXAM:
ULTRASOUND GUIDED FINE NEEDLE ASPIRATION OF INDETERMINATE THYROID
NODULE
TECHNIQUE: Informed written consent was obtained from the patient after a
discussion of the risks, benefits and alternatives to treatment.
Questions regarding the procedure were encouraged and answered. A
timeout was performed prior to the initiation of the procedure.

[Series 1: us fna biopsy thyroid 1st lesion · 16 acquisitions, 14 frames shown]
[im 1/16]
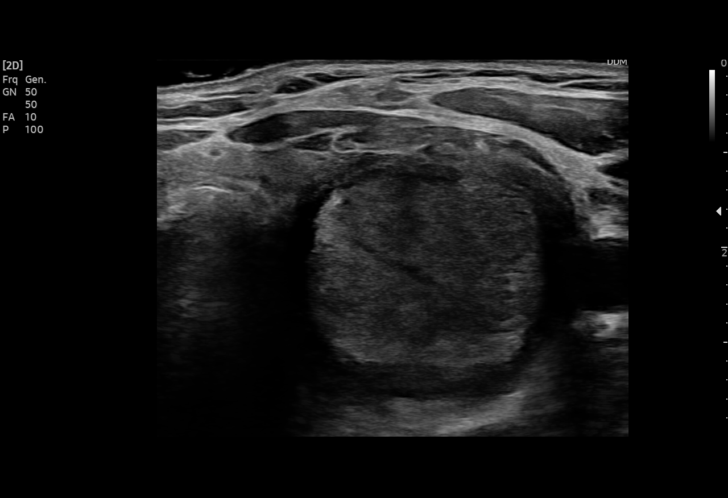
[im 2/16]
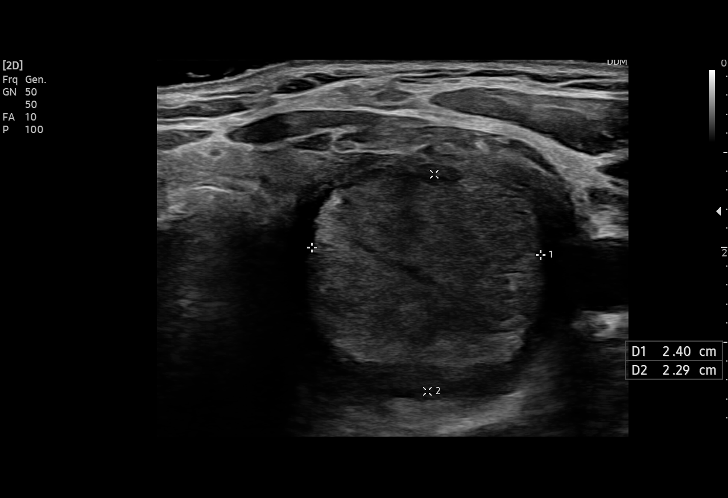
[im 3/16]
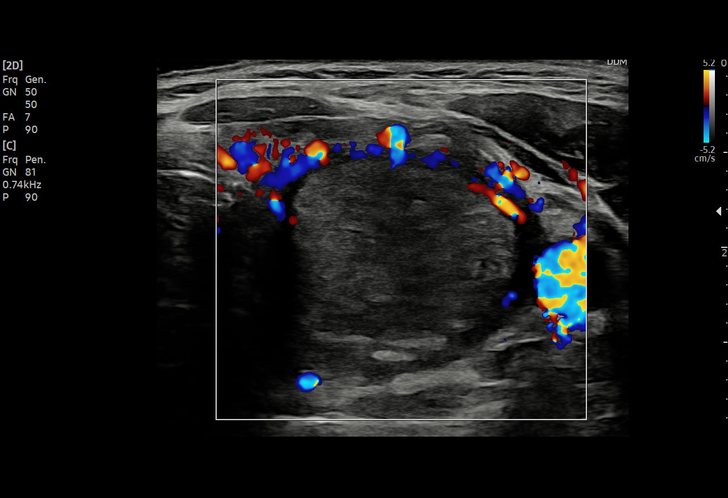
[im 5/16]
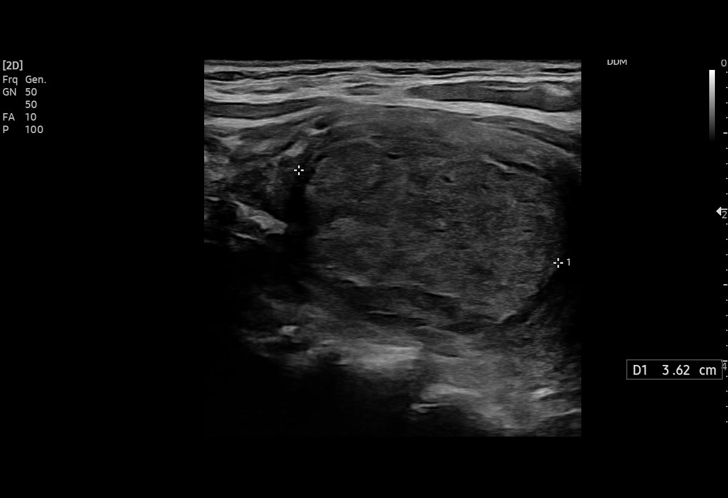
[im 6/16]
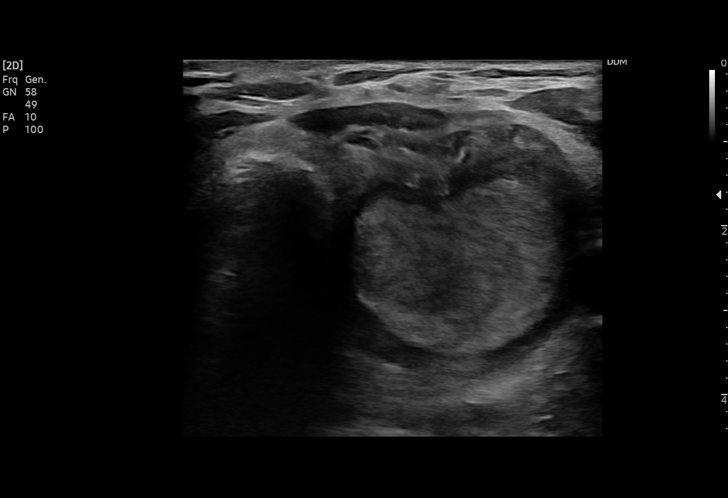
[im 7/16]
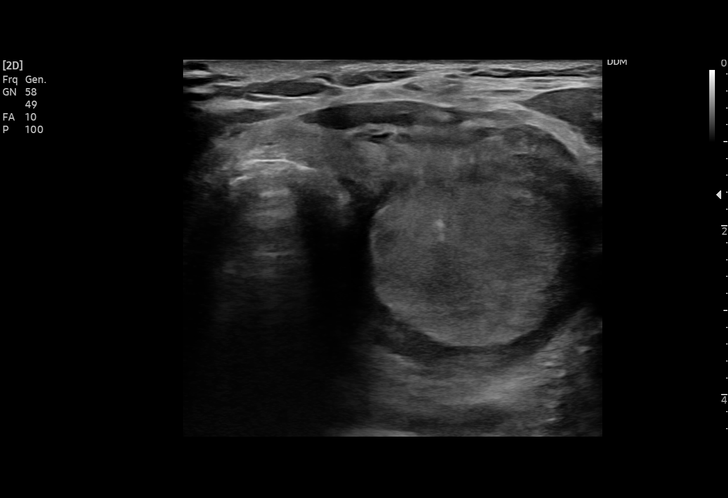
[im 8/16]
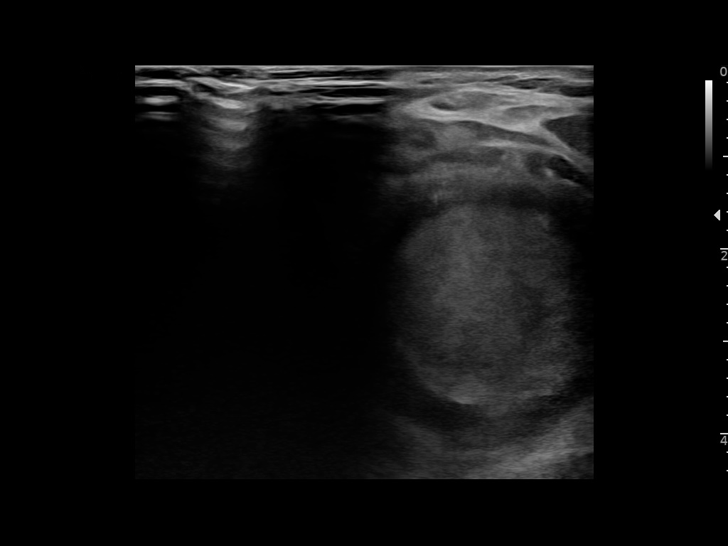
[im 9/16]
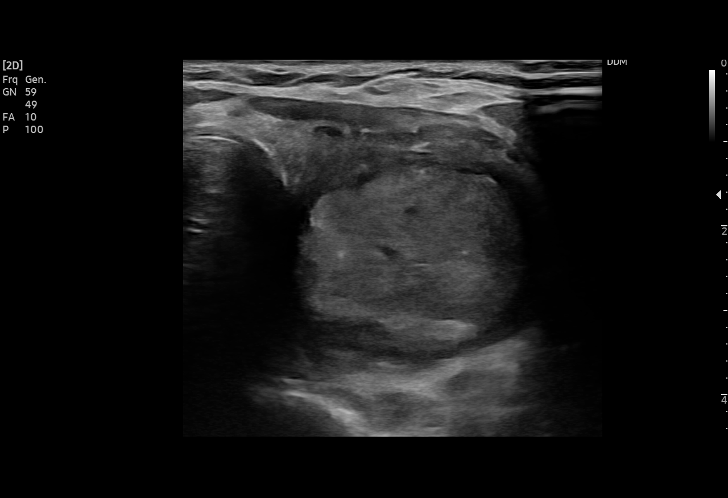
[im 10/16]
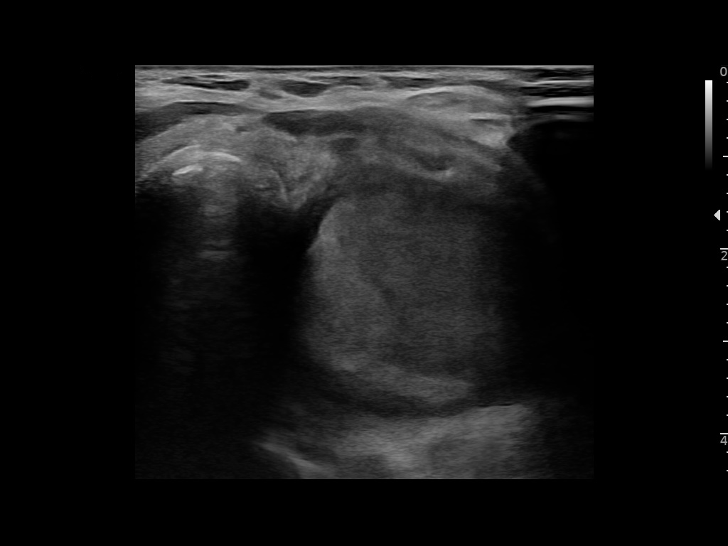
[im 11/16]
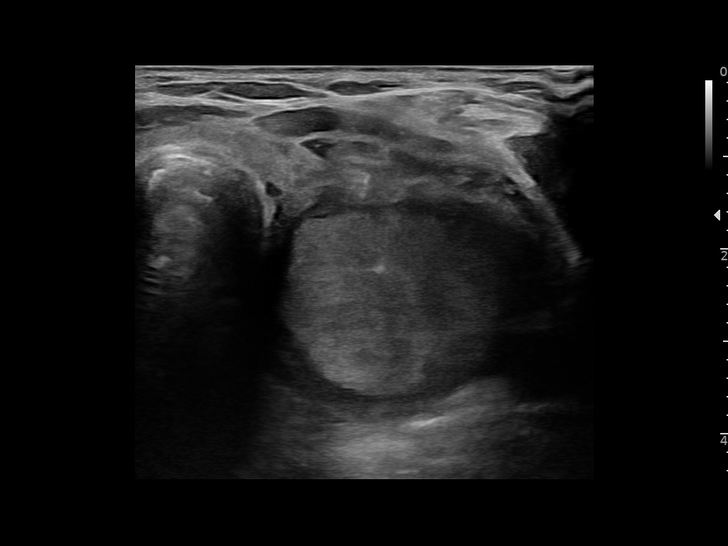
[im 13/16]
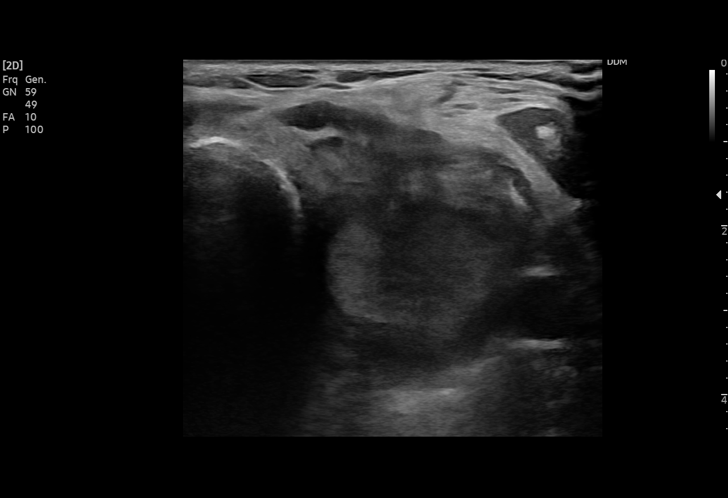
[im 14/16]
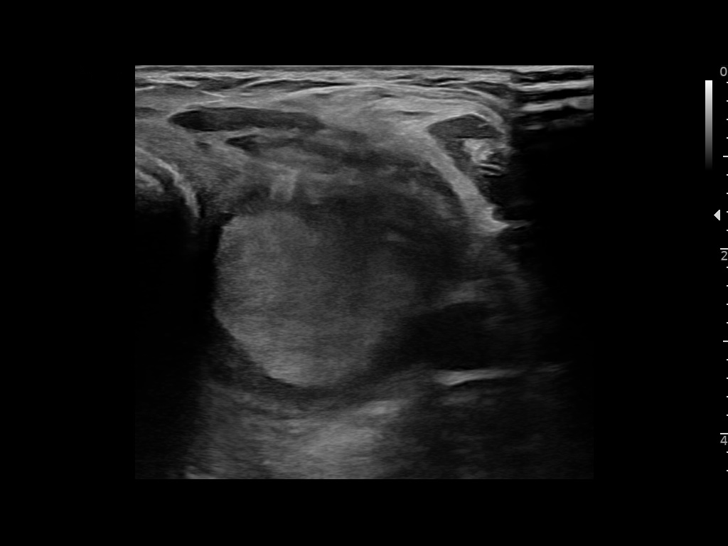
[im 15/16]
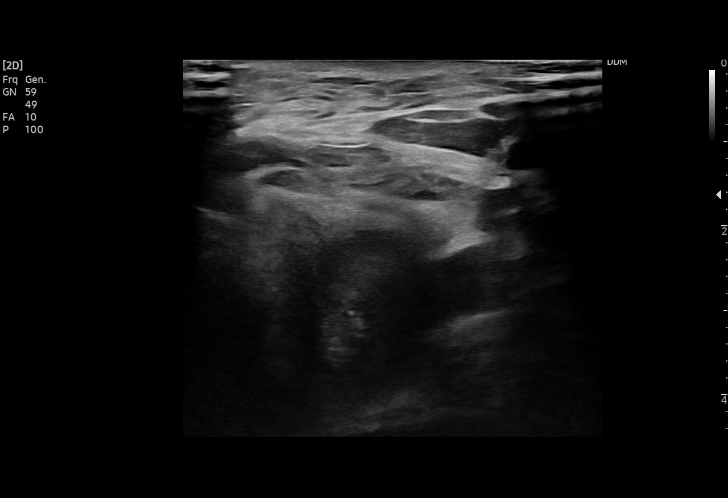
[im 16/16]
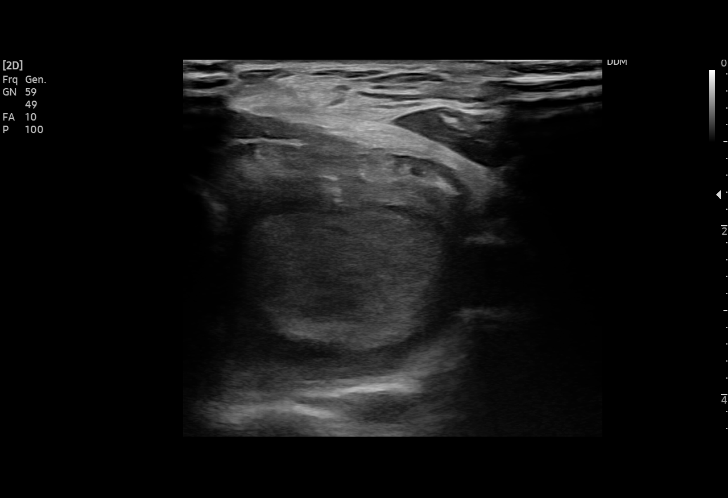

[14 of 16 positions shown; findings below may reference images not displayed]

Pre-procedural ultrasound scanning demonstrated unchanged size and
appearance of the indeterminate nodule within the left thyroid

The procedure was planned. The neck was prepped in the usual sterile
fashion, and a sterile drape was applied covering the operative
field. A timeout was performed prior to the initiation of the
procedure. Local anesthesia was provided with 1% lidocaine.

Under direct ultrasound guidance, 7 FNA biopsies were performed of
the left thyroid nodule with a 25 gauge needle. Two of these nodules
were submitted 4 ThyroSeq. multiple ultrasound images were saved for
procedural documentation purposes. The samples were prepared and
submitted to pathology.

Limited post procedural scanning was negative for hematoma or
additional complication. Dressings were placed. The patient
tolerated the above procedures procedure well without immediate
postprocedural complication.
FINDINGS: FINDINGS
Nodule reference number based on prior diagnostic ultrasound:
cm. Given the interval, there has been growth, with the greatest
measurement estimated today over 3 cm.

Maximum size: 3.6 cm

Location: Left  ;  Mid

ACR TI-RADS total points: 5

ACR TI-RADS risk category:  TR 4

Prior biopsy:  Yes

Reason for biopsy: other

Ultrasound imaging confirms appropriate placement of the needles
within the thyroid nodule.
IMPRESSION: Status post ultrasound-guided biopsy left thyroid nodule

## 2021-12-24 ENCOUNTER — Encounter (INDEPENDENT_AMBULATORY_CARE_PROVIDER_SITE_OTHER): Payer: Self-pay

## 2022-01-14 IMAGING — CT CT NECK W/ CM
2 of 3 series · 8 of 14 positions shown, 10 images · IV contrast (omnipaque)
Comparison: 06/20/2016 CT neck.

CLINICAL DATA: Thyroid mass

EXAM:
CT NECK WITH CONTRAST
TECHNIQUE: Multidetector CT imaging of the neck was performed using the
standard protocol following the bolus administration of intravenous
contrast.
CONTRAST:  60mL OMNIPAQUE IOHEXOL 300 MG/ML  SOLN

[Series 2: axial neck · axial · 0.55mm/px · z∈[-240,-144]mm · 3 of 98 slices shown]
[im 25/98  bone]
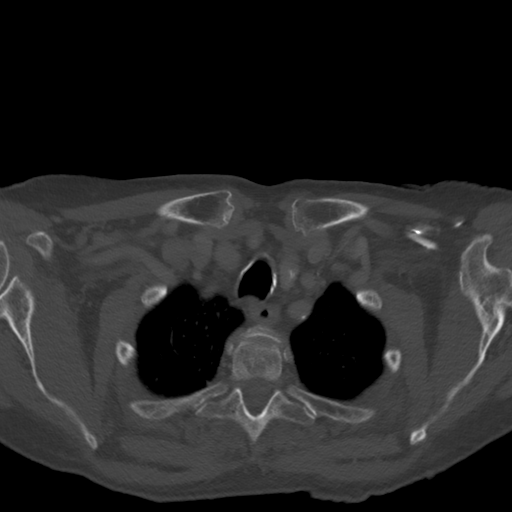
[im 49/98  bone]
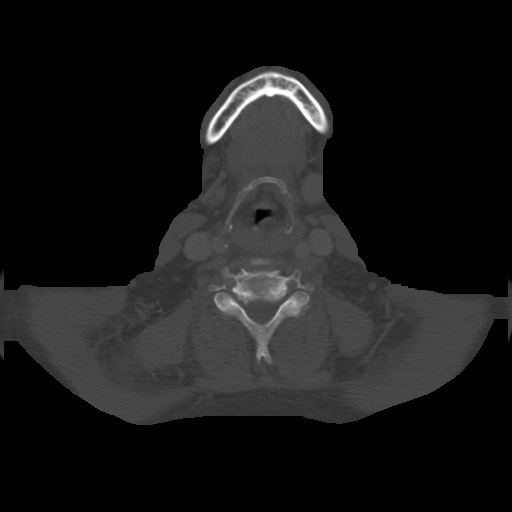
[im 73/98  bone]
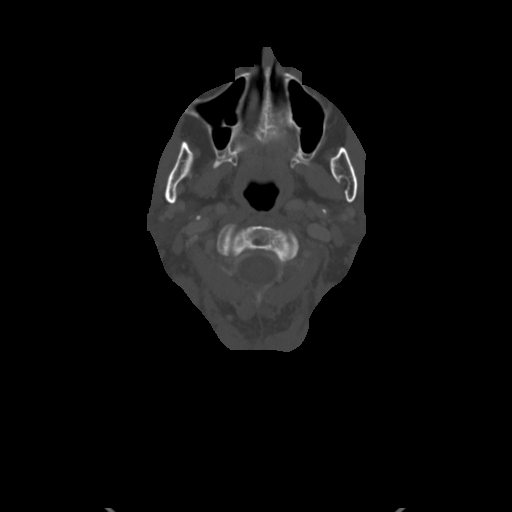

[Series 7: orthogonal ax · axial · 0.42mm/px · z∈[-304,-158]mm · 5 of 123 slices shown, 7 images]
[im 21/123  soft-tissue]
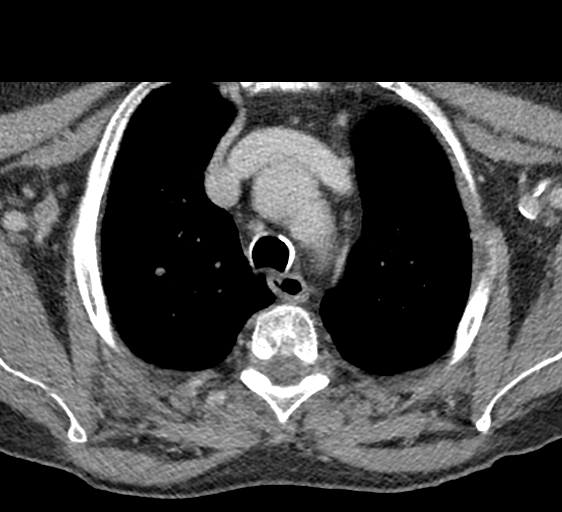
[im 21/123  bone]
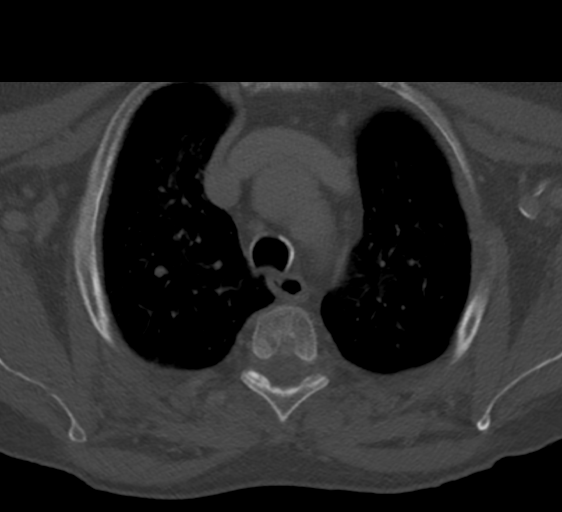
[im 41/123  bone]
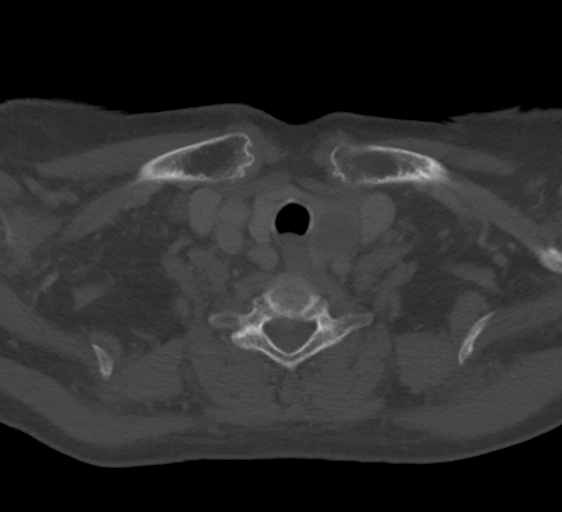
[im 62/123  bone]
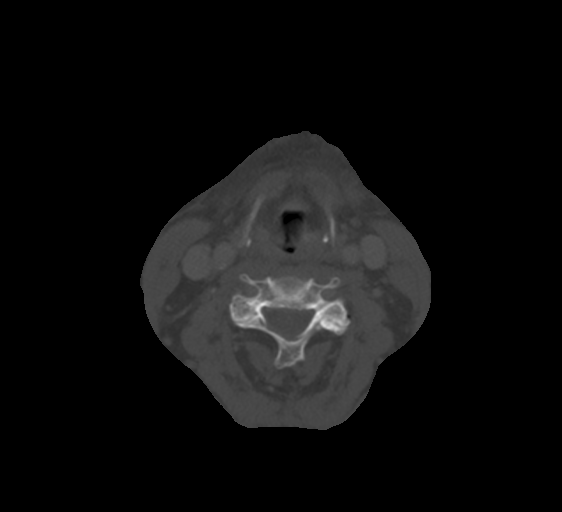
[im 82/123  bone]
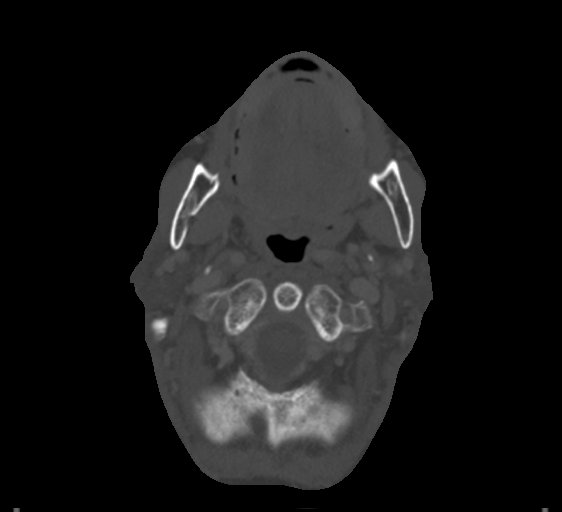
[im 102/123  soft-tissue]
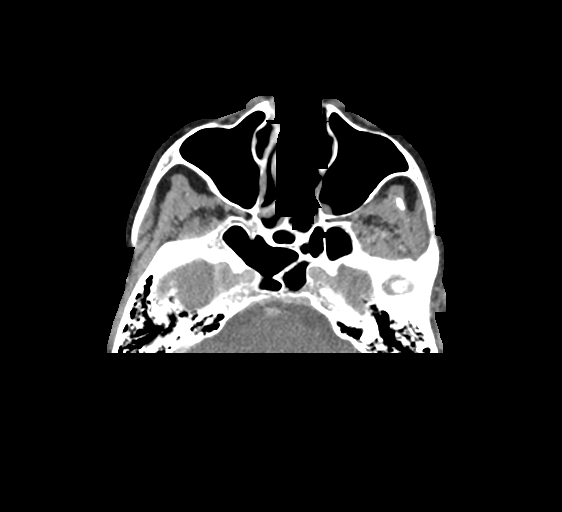
[im 102/123  bone]
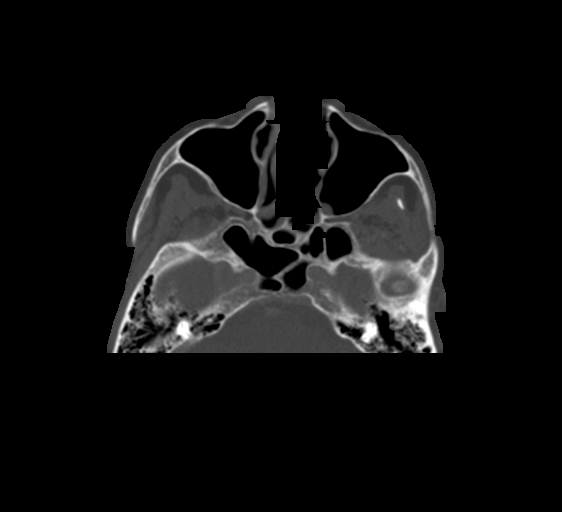

[8 of 14 positions shown; findings below may reference images not displayed]

FINDINGS: Pharynx and larynx: Normal. No mass or swelling.

Salivary glands: No inflammation, mass, or stone.

Thyroid: Increased size of left thyroid hypodensity measuring 2.2 x
1.6 x 2.8 cm ([DATE], [DATE]). Coarse inferior left thyroid
calcifications.

Lymph nodes: 0.7 cm left level 2A node ([DATE]). 0.6 cm left level 6
node inferior to the left thyroid lobe ([DATE]). 0.5 cm right
tracheoesophageal groove node ([DATE]).

Vascular: Normal intravascular enhancement. Atheromatous disease
involving the aortic arch and its branch vessels. Retropharyngeal
common carotid arteries. Retropharyngeal left internal carotid
artery. Bilateral carotid bifurcation atherosclerotic calcifications
without significant narrowing.

Limited intracranial: Grossly unremarkable.

Visualized orbits: Normal orbits.

Mastoids and visualized paranasal sinuses: Small left maxillary
sinus mucous retention cyst. No mastoid effusion.

Skeleton: Multilevel spondylosis most prominent at the C5-6 level.
Grade 1 C4-5 anterolisthesis and grade 1 C5-6 retrolisthesis. No
acute or focal osseous lesions.

Upper chest: Upper lung atelectasis.

Other: None.
IMPRESSION: 2.8 cm left thyroid mass consistent with biopsy-proven follicular
thyroid carcinoma.

0.7 cm left level 2A node, minimally hypermetabolic on recent PET.

0.6 cm left level 6 node inferior to left thyroid lobe.

Likely reactive 0.5 cm right tracheoesophageal groove node.

Retropharyngeal course of the bilateral common and left internal
carotid arteries.

## 2022-01-21 ENCOUNTER — Ambulatory Visit: Payer: Medicare Other | Admitting: Physician Assistant

## 2022-01-24 ENCOUNTER — Ambulatory Visit (INDEPENDENT_AMBULATORY_CARE_PROVIDER_SITE_OTHER): Payer: Medicare Other | Admitting: Urology

## 2022-01-24 DIAGNOSIS — N3941 Urge incontinence: Secondary | ICD-10-CM | POA: Diagnosis not present

## 2022-01-24 NOTE — Progress Notes (Signed)
PTNS   Session # 18 of 45   Health & Social Factors: no changes Caffeine: 0 Alcohol: 0 Daytime voids #per day: 5-6    Night-time voids #per night: 5-6 Urgency: mild Incontinence Episodes #per day: 3 Ankle used: right Treatment Setting: 6 Feeling/ Response: senory Comments: pt tolerated well    Performed By: August Luz    Follow Up: 1 month follow up for 19 of 45

## 2022-01-30 ENCOUNTER — Encounter: Payer: Self-pay | Admitting: Dermatology

## 2022-01-30 ENCOUNTER — Ambulatory Visit (INDEPENDENT_AMBULATORY_CARE_PROVIDER_SITE_OTHER): Payer: Medicare Other | Admitting: Dermatology

## 2022-01-30 VITALS — BP 128/71 | HR 76

## 2022-01-30 DIAGNOSIS — Z85828 Personal history of other malignant neoplasm of skin: Secondary | ICD-10-CM

## 2022-01-30 DIAGNOSIS — L820 Inflamed seborrheic keratosis: Secondary | ICD-10-CM | POA: Diagnosis not present

## 2022-01-30 DIAGNOSIS — L570 Actinic keratosis: Secondary | ICD-10-CM

## 2022-01-30 DIAGNOSIS — L918 Other hypertrophic disorders of the skin: Secondary | ICD-10-CM

## 2022-01-30 DIAGNOSIS — Z1283 Encounter for screening for malignant neoplasm of skin: Secondary | ICD-10-CM

## 2022-01-30 DIAGNOSIS — D492 Neoplasm of unspecified behavior of bone, soft tissue, and skin: Secondary | ICD-10-CM

## 2022-01-30 DIAGNOSIS — D485 Neoplasm of uncertain behavior of skin: Secondary | ICD-10-CM

## 2022-01-30 DIAGNOSIS — D229 Melanocytic nevi, unspecified: Secondary | ICD-10-CM

## 2022-01-30 DIAGNOSIS — L578 Other skin changes due to chronic exposure to nonionizing radiation: Secondary | ICD-10-CM

## 2022-01-30 DIAGNOSIS — L814 Other melanin hyperpigmentation: Secondary | ICD-10-CM

## 2022-01-30 DIAGNOSIS — L821 Other seborrheic keratosis: Secondary | ICD-10-CM

## 2022-01-30 NOTE — Patient Instructions (Addendum)
Cryotherapy Aftercare  Wash gently with soap and water everyday.   Apply Vaseline daily until healed.    Wound Care Instructions  Cleanse wound gently with soap and water once a day then pat dry with clean gauze. Apply a thin coat of Petrolatum (petroleum jelly, "Vaseline") over the wound (unless you have an allergy to this). We recommend that you use a new, sterile tube of Vaseline. Do not pick or remove scabs. Do not remove the yellow or white "healing tissue" from the base of the wound.  Cover the wound with fresh, clean, nonstick gauze and secure with paper tape. You may use Band-Aids in place of gauze and tape if the wound is small enough, but would recommend trimming much of the tape off as there is often too much. Sometimes Band-Aids can irritate the skin.  You should call the office for your biopsy report after 1 week if you have not already been contacted.  If you experience any problems, such as abnormal amounts of bleeding, swelling, significant bruising, significant pain, or evidence of infection, please call the office immediately.  FOR ADULT SURGERY PATIENTS: If you need something for pain relief you may take 1 extra strength Tylenol (acetaminophen) AND 2 Ibuprofen ('200mg'$  each) together every 4 hours as needed for pain. (do not take these if you are allergic to them or if you have a reason you should not take them.) Typically, you may only need pain medication for 1 to 3 days.      Recommend daily broad spectrum sunscreen SPF 30+ to sun-exposed areas, reapply every 2 hours as needed. Call for new or changing lesions.  Staying in the shade or wearing long sleeves, sun glasses (UVA+UVB protection) and wide brim hats (4-inch brim around the entire circumference of the hat) are also recommended for sun protection.     Seborrheic Keratosis  What causes seborrheic keratoses? Seborrheic keratoses are harmless, common skin growths that first appear during adult life.  As time goes  by, more growths appear.  Some people may develop a large number of them.  Seborrheic keratoses appear on both covered and uncovered body parts.  They are not caused by sunlight.  The tendency to develop seborrheic keratoses can be inherited.  They vary in color from skin-colored to gray, brown, or even black.  They can be either smooth or have a rough, warty surface.   Seborrheic keratoses are superficial and look as if they were stuck on the skin.  Under the microscope this type of keratosis looks like layers upon layers of skin.  That is why at times the top layer may seem to fall off, but the rest of the growth remains and re-grows.    Treatment Seborrheic keratoses do not need to be treated, but can easily be removed in the office.  Seborrheic keratoses often cause symptoms when they rub on clothing or jewelry.  Lesions can be in the way of shaving.  If they become inflamed, they can cause itching, soreness, or burning.  Removal of a seborrheic keratosis can be accomplished by freezing, burning, or surgery. If any spot bleeds, scabs, or grows rapidly, please return to have it checked, as these can be an indication of a skin cancer.   Melanoma ABCDEs  Melanoma is the most dangerous type of skin cancer, and is the leading cause of death from skin disease.  You are more likely to develop melanoma if you: Have light-colored skin, light-colored eyes, or red or blond hair Spend  a lot of time in the sun Tan regularly, either outdoors or in a tanning bed Have had blistering sunburns, especially during childhood Have a close family member who has had a melanoma Have atypical moles or large birthmarks  Early detection of melanoma is key since treatment is typically straightforward and cure rates are extremely high if we catch it early.   The first sign of melanoma is often a change in a mole or a new dark spot.  The ABCDE system is a way of remembering the signs of melanoma.  A for asymmetry:  The  two halves do not match. B for border:  The edges of the growth are irregular. C for color:  A mixture of colors are present instead of an even brown color. D for diameter:  Melanomas are usually (but not always) greater than 53m - the size of a pencil eraser. E for evolution:  The spot keeps changing in size, shape, and color.  Please check your skin once per month between visits. You can use a small mirror in front and a large mirror behind you to keep an eye on the back side or your body.   If you see any new or changing lesions before your next follow-up, please call to schedule a visit.  Please continue daily skin protection including broad spectrum sunscreen SPF 30+ to sun-exposed areas, reapplying every 2 hours as needed when you're outdoors.   Staying in the shade or wearing long sleeves, sun glasses (UVA+UVB protection) and wide brim hats (4-inch brim around the entire circumference of the hat) are also recommended for sun protection.    Due to recent changes in healthcare laws, you may see results of your pathology and/or laboratory studies on MyChart before the doctors have had a chance to review them. We understand that in some cases there may be results that are confusing or concerning to you. Please understand that not all results are received at the same time and often the doctors may need to interpret multiple results in order to provide you with the best plan of care or course of treatment. Therefore, we ask that you please give u[place]2 business days to thoroughly review all your results before contacting the office for clarification. Should we see a critical lab result, you will be contacted sooner.   If You Need Anything After Your Visit  If you have any questions or concerns for your doctor, please call our main line at [phone] press option 4 to reach your doctor's medical assistant. If no one answers, please leave a voicemail as directed and we will return your call as  soon as possible. Messages left after 4 pm will be answered the following business day.   You may also send [place] message via [place] We typically respond to MyChart messages within 1-2 business days.  For prescription refills, please ask your pharmacy to contact our office. Our fax number is [phone]  If you have an urgent issue when the clinic is closed that cannot wait until the next business day, you can page your doctor at the number below.    Please note that while we do our best to be available for urgent issues outside of office hours, we are not available 24/7.   If you have an urgent issue and are unable to reach u[place] you may choose to seek medical care at your doctor's office, retail clinic, urgent care center, or emergency room.  If you have a  medical emergency, please immediately call 911 or go to the emergency department.  Pager Numbers  - Dr. Nehemiah Massed: [phone]  - Dr. Laurence Ferrari: [phone]  - Dr. Nicole Kindred: [phone]  In the event of inclement weather, please call our main line at [phone] for an update on the status of any delays or closures.  Dermatology Medication Tips: Please keep the boxes that topical medications come in in order to help keep track of the instructions about where and how to use these. Pharmacies typically print the medication instructions only on the boxes and not directly on the medication tubes.   If your medication is too expensive, please contact our office at [phone] option 4 or send [place] a message through [place].   We are unable to tell what your co-pay for medications will be in advance as this is different depending on your insurance coverage. However, we may be able to find a substitute medication at lower cost or fill out paperwork to get insurance to cover a needed medication.   If a prior authorization is required to get your medication covered by your insurance company, please allow [place] 1-2 business days to complete this  process.  Drug prices often vary depending on where the prescription is filled and some pharmacies may offer cheaper prices.  The website www.goodrx.com contains coupons for medications through different pharmacies. The prices here do not account for what the cost may be with help from insurance (it may be cheaper with your insurance), but the website can give you the price if you did not use any insurance.  - You can print the associated coupon and take it with your prescription to the pharmacy.  - You may also stop by our office during regular business hours and pick up a GoodRx coupon card.  - If you need your prescription sent electronically to a different pharmacy, notify our office through [facility] or by phone at [phone] option 4.     Si Usted Necesita Algo Despus de Su Visita  Tambin puede enviarnos un mensaje a travs de Pharmacist, community. Por lo general respondemos a los mensajes de MyChart en el transcurso de 1 a 2 das hbiles.  Para renovar recetas, por favor pida a su farmacia que se ponga en contacto con nuestra oficina. Harland Dingwall de fax es [place] [phone].  Si tiene un asunto urgente cuando la clnica est cerrada y que no puede esperar hasta el siguiente da hbil, puede llamar/localizar a su doctor(a) al nmero que aparece a continuacin.   Por favor, tenga en cuenta que aunque hacemos todo lo posible para estar disponibles para asuntos urgentes fuera del horario de [place], no estamos disponibles las 24 horas del da, los 7 das de la [place].   Si tiene un problema urgente y no puede comunicarse con nosotros, puede optar por buscar atencin mdica  en el consultorio de su doctor(a), en una clnica privada, en un centro de atencin urgente o en una sala de emergencias.  Si tiene Engineering geologist, por favor llame inmediatamente al 911 o vaya a la sala de emergencias.  Nmeros de bper  - Dr. Nehemiah Massed: [phone]  - Dra. Moye: [phone]  - Dra.  Nicole Kindred: [phone]  En caso de inclemencias del [place], por favor llame a Johnsie Kindred principal al [phone] para una actualizacin sobre el [place] de cualquier retraso o cierre.  Consejos para la medicacin en dermatologa: Por favor, guarde las cajas en las que vienen los medicamentos de uso tpico para ayudarle  a seguir las [employer] dnde y cmo usarlos. Las farmacias generalmente imprimen las instrucciones del medicamento slo en las cajas y no directamente en los tubos del [place].   Si su medicamento es muy caro, por favor, pngase en contacto con Zigmund Daniel llamando al [phone] y presione la opcin 4 o envenos un mensaje a travs de Pharmacist, community.   No podemos decirle cul ser su copago por los medicamentos por adelantado ya que esto es diferente dependiendo de la cobertura de su seguro. Sin embargo, es posible que podamos encontrar un medicamento sustituto a Electrical engineer un formulario para que el seguro cubra el medicamento que se considera necesario.   Si se requiere una autorizacin previa para que su compaa de seguros [place] su medicamento, por favor permtanos de 1 a 2 das hbiles para completar este proceso.  Los precios de los medicamentos varan con frecuencia dependiendo del Environmental consultant de dnde se surte la receta y alguna farmacias pueden ofrecer precios ms baratos.  El sitio web www.goodrx.com tiene cupones para medicamentos de Airline pilot. Los precios aqu no tienen en cuenta lo que podra costar con la ayuda del seguro (puede ser ms barato con su seguro), pero el sitio web puede darle el precio si no utiliz Research scientist (physical sciences).  - Puede imprimir el cupn correspondiente y llevarlo con su receta a la farmacia.  - Tambin puede pasar por nuestra oficina durante el horario de atencin regular y Charity fundraiser una tarjeta de cupones de GoodRx.  - Si necesita que su receta se enve electrnicamente a una farmacia diferente, informe a nuestra oficina a  travs de MyChart de [place] o por telfono llamando al [phone] y presione la opcin 4.

## 2022-01-30 NOTE — Progress Notes (Signed)
Follow-Up Visit   Subjective  [name] is a 88 y.o. female who presents for the following: Annual Exam (Hx of BCC, Hx of SCC. Areas of concern on face and back).  Also legs.  Some spots are irritated, including spot at hairline that she picks at.  The patient presents for Total-Body Skin Exam (TBSE) for skin cancer screening and mole check.  The patient has spots, moles and lesions to be evaluated, some may be new or changing and the patient has concerns that these could be cancer.   The following portions of the chart were reviewed this encounter and updated as appropriate:      Review of Systems: No other skin or systemic complaints except as noted in HPI or Assessment and Plan.   Objective  Well appearing patient in no apparent distress; mood and affect are within normal limits.  A full examination was performed including scalp, head, eyes, ears, nose, lips, neck, chest, axillae, abdomen, back, buttocks, bilateral upper extremities, bilateral lower extremities, hands, feet, fingers, toes, fingernails, and toenails. All findings within normal limits unless otherwise noted below.  right lateral ankle x2, left lateral ankle x1, upper back x2, left chest x1, left temporal hair line x1 (7) Erythematous keratotic or waxy stuck-on papule or plaque.  left paranasal (adjacent to white scar) x1, left nasal dorsum x1 (2) Pink scaly macules  Left Anterior Distal Thigh Superior 27m pink gray-brown macule slightly waxy       Left Anterior Distal Thigh Inferior 1cm pink brown macule slightly waxy        Assessment & Plan   History of Basal Cell Carcinoma of the Skin. Right inferior knee. 11/11/2017 - No evidence of recurrence today - Recommend regular full body skin exams - Recommend daily broad spectrum sunscreen SPF 30+ to sun-exposed areas, reapply every 2 hours as needed.  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  - Call if any new or changing lesions are noted between office visits  Lentigines - Scattered tan macules - Due to sun exposure - Benign-appearing, observe - Recommend daily broad spectrum sunscreen SPF 30+ to sun-exposed areas, reapply every 2 hours as needed. - Call for any changes  Seborrheic Keratoses - Stuck-on, waxy, tan-brown papules and/or plaques  - Benign-appearing - Discussed benign etiology and prognosis. - Observe - Call for any changes  Melanocytic Nevi - Tan-brown and/or pink-flesh-colored symmetric macules and papules - Benign appearing on exam today - Observation - Call clinic for new or changing moles - Recommend daily use of broad spectrum spf 30+ sunscreen to sun-exposed areas.   Hemangiomas - Red papules - Discussed benign nature - Observe - Call for any changes  Actinic Damage - Chronic condition, secondary to cumulative UV/sun exposure - diffuse scaly erythematous macules with underlying dyspigmentation - Recommend daily broad spectrum sunscreen SPF 30+ to sun-exposed areas, reapply every 2 hours as needed.  - Staying in the shade or wearing long sleeves, sun glasses (UVA+UVB protection) and wide brim hats (4-inch brim around the entire circumference of the hat) are also recommended for sun protection.  - Call for new or changing lesions.  Skin cancer screening performed today.  Acrochordons (Skin Tags) - Fleshy,  skin-colored pedunculated papules - Benign appearing.  - Observe. - If desired, they can be removed with an in office procedure that is not covered by insurance. - Please call the clinic if you notice any new or changing lesions.   Inflamed seborrheic keratosis (7) right lateral ankle x2, left lateral  ankle x1, upper back x2, left chest x1, left temporal hair line x1  (Vs HyAK ankles)  Symptomatic, irritating, patient would like treated.  Recheck on R ankle on follow up  Destruction of lesion - right lateral ankle x2, left lateral ankle x1, upper back x2, left chest x1, left temporal hair line x1  Destruction method: cryotherapy   Informed consent: discussed and consent obtained   Lesion destroyed using liquid nitrogen: Yes   Region frozen until ice ball extended beyond lesion: Yes   Outcome: patient tolerated procedure well with no complications   Post-procedure details: wound care instructions given   Additional details:  Prior to procedure, discussed risks of blister formation, small wound, skin dyspigmentation, or rare scar following cryotherapy. Recommend Vaseline ointment to treated areas while healing.   AK (actinic keratosis) (2) left paranasal (adjacent to white scar) x1, left nasal dorsum x1  Actinic keratoses are precancerous spots that appear secondary to cumulative UV radiation exposure/sun exposure over time. They are chronic with expected duration over 1 year. A portion of actinic keratoses will progress to squamous cell carcinoma of the skin. It is not possible to reliably predict which spots will progress to skin cancer and so treatment is recommended to prevent development of skin cancer.  Recommend daily broad spectrum sunscreen SPF 30+ to sun-exposed areas, reapply every 2 hours as needed.  Recommend staying in the shade or wearing long sleeves, sun glasses (UVA+UVB protection) and wide brim hats (4-inch brim around the entire circumference of the hat). Call for new or changing lesions.  Destruction of lesion - left paranasal (adjacent to white scar) x1, left nasal dorsum x1  Destruction method: cryotherapy   Informed consent: discussed and consent obtained   Lesion destroyed using liquid nitrogen: Yes   Region frozen until ice ball extended beyond lesion: Yes    Outcome: patient tolerated procedure well with no complications   Post-procedure details: wound care instructions given   Additional details:  Prior to procedure, discussed risks of blister formation, small wound, skin dyspigmentation, or rare scar following cryotherapy. Recommend Vaseline ointment to treated areas while healing.   Neoplasm of skin (2) Left Anterior Distal Thigh Superior  Skin / nail biopsy Type of biopsy: tangential   Informed consent: discussed and consent obtained   Anesthesia: the lesion was anesthetized in a standard fashion   Anesthesia comment:  Area prepped with alcohol Anesthetic:  1% lidocaine w/ epinephrine 1-100,000 buffered w/ 8.4% NaHCO3 Instrument used: flexible razor blade   Hemostasis achieved with: pressure, aluminum chloride and electrodesiccation   Outcome: patient tolerated procedure well   Post-procedure details: wound care instructions given   Post-procedure details comment:  Ointment and small bandage applied  Specimen 1 - Surgical pathology Differential Diagnosis: ISK R/O MMIS  Check Margins: No  Left Anterior Distal Thigh Inferior  Skin / nail biopsy Type of biopsy: tangential   Informed consent: discussed and consent obtained   Anesthesia: the lesion was anesthetized in a standard fashion   Anesthesia comment:  Area prepped with alcohol Anesthetic:  1% lidocaine w/ epinephrine 1-100,000 buffered w/ 8.4% NaHCO3 Instrument used: flexible razor blade   Hemostasis achieved with: pressure, aluminum chloride and electrodesiccation  Outcome: patient tolerated procedure well   Post-procedure details: wound care instructions given   Post-procedure details comment:  Ointment and small bandage applied  Specimen 2 - Surgical pathology Differential Diagnosis: ISK, R/O SCC/BCC  Check Margins: No  Okay to speak with daughter regarding pathology results.    Return in about 1 year (around 01/31/2023) for TBSE, HxBCC, HxSCC.  I, Emelia Salisbury, CMA, am acting as scribe for Brendolyn Patty, MD.  Documentation: I have reviewed the above documentation for accuracy and completeness, and I agree with the above.  Brendolyn Patty MD

## 2022-02-11 ENCOUNTER — Telehealth: Payer: Self-pay

## 2022-02-11 NOTE — Telephone Encounter (Signed)
Advised patient's daughter, [name], biopsies of the left anterior distal thigh superior was Hypertrophic AK (will need LN2 if recurs) and inferior was benign SK (no further treatment needed).

## 2022-02-11 NOTE — Telephone Encounter (Signed)
-----   Message from Brendolyn Patty, MD sent at 02/11/2022  8:58 AM EST ----- 1. Skin , left anterior distal thigh superior HYPERTROPHIC ACTINIC KERATOSIS AND SEBORRHEIC KERATOSIS 2. Skin , left anterior distal thigh inferior SEBORRHEIC KERATOSIS, INFLAMED  1. Precancer and benign SK, cryotherapy if recurs 2. Benign ISK, no further treatment needed   - please call patient

## 2022-02-21 ENCOUNTER — Ambulatory Visit: Payer: Medicare Other | Admitting: Physician Assistant

## 2022-02-21 ENCOUNTER — Ambulatory Visit (INDEPENDENT_AMBULATORY_CARE_PROVIDER_SITE_OTHER): Payer: Medicare Other | Admitting: Physician Assistant

## 2022-02-21 DIAGNOSIS — N3941 Urge incontinence: Secondary | ICD-10-CM

## 2022-02-21 NOTE — Patient Instructions (Signed)
Tracking Your Bladder Symptoms   Patient Name:___________________________________________________  Example: Day   Daytime Voids  Nighttime Voids Urgency for the Day (none, mild, strong, severe) Number of Accidents/ Leaks Beverage Comments  Monday IIII II Strong I Water IIII Coffee  I     Week Starting:____________________________________  Day Daytime  Voids Nighttime  Voids Urgency for the Day (none, mild, strong, severe) Number of Accidents/ Leaks Beverages Comments                                                           This week my symptoms were:  O much better O better O the same O worse   

## 2022-02-21 NOTE — Progress Notes (Signed)
PTNS   Session # 19 of 45   Health & Social Factors: no changes Caffeine: 0 Alcohol: 0 Daytime voids #per day: 5-6    Night-time voids #per night: 5-6 Urgency: mild Incontinence Episodes #per day: 2 Ankle used: right Treatment Setting: 6 Feeling/ Response: senory Comments: pt tolerated well    Performed By: August Luz    Follow Up: 1 month follow up for 20 of 45

## 2022-03-25 ENCOUNTER — Ambulatory Visit (INDEPENDENT_AMBULATORY_CARE_PROVIDER_SITE_OTHER): Payer: 59 | Admitting: Physician Assistant

## 2022-03-25 DIAGNOSIS — N3941 Urge incontinence: Secondary | ICD-10-CM

## 2022-03-25 NOTE — Progress Notes (Signed)
PTNS   Session # 20 of 45   Health & Social Factors: no changes Caffeine: 0 Alcohol: 0 Daytime voids #per day: 5-6    Night-time voids #per night: 5-6 Urgency: mild Incontinence Episodes #per day: 2 Ankle used: right Treatment Setting: 2 Feeling/ Response: senory Comments: pt tolerated well    Performed By: Gaspar Cola CMA    Follow Up: 1 month follow up for 21 of 45

## 2022-03-25 NOTE — Patient Instructions (Signed)
Tracking Your Bladder Symptoms   Patient Name:___________________________________________________  Example: Day   Daytime Voids  Nighttime Voids Urgency for the Day (none, mild, strong, severe) Number of Accidents/ Leaks Beverage Comments  Monday IIII II Strong I Water IIII Coffee  I     Week Starting:____________________________________  Day Daytime  Voids Nighttime  Voids Urgency for the Day (none, mild, strong, severe) Number of Accidents/ Leaks Beverages Comments                                                           This week my symptoms were:  O much better O better O the same O worse   

## 2022-04-29 ENCOUNTER — Ambulatory Visit (INDEPENDENT_AMBULATORY_CARE_PROVIDER_SITE_OTHER): Payer: 59 | Admitting: Physician Assistant

## 2022-04-29 DIAGNOSIS — N3941 Urge incontinence: Secondary | ICD-10-CM | POA: Diagnosis not present

## 2022-04-29 MED ORDER — GEMTESA 75 MG PO TABS: 75.0000 mg | ORAL_TABLET | Freq: Every day | ORAL | 3 refills | Status: DC

## 2022-04-29 NOTE — Progress Notes (Signed)
PTNS  Session # 21 of 45  Health & Social Factors: no change Caffeine: 0 Alcohol: 0 Daytime voids #per day: 7 Night-time voids #per night: 7 Urgency: mild Incontinence Episodes #per day: 0 Ankle used: right Treatment Setting: 10 Feeling/ Response: both Comments: Patient tolerated well. Urinary symptoms improved this month.  Performed By: Debroah Loop, PA-C   Follow Up: 1 month

## 2022-04-29 NOTE — Patient Instructions (Signed)

## 2022-04-29 NOTE — Addendum Note (Signed)
Addended by: Mickle Plumb on: 04/29/2022 03:56 PM   Modules accepted: Orders

## 2022-06-03 ENCOUNTER — Ambulatory Visit (INDEPENDENT_AMBULATORY_CARE_PROVIDER_SITE_OTHER): Payer: 59 | Admitting: Physician Assistant

## 2022-06-03 DIAGNOSIS — N3941 Urge incontinence: Secondary | ICD-10-CM

## 2022-06-03 NOTE — Patient Instructions (Signed)
Tracking Your Bladder Symptoms   Patient Name:___________________________________________________  Example: Day   Daytime Voids  Nighttime Voids Urgency for the Day (none, mild, strong, severe) Number of Accidents/ Leaks Beverage Comments  Monday IIII II Strong I Water IIII Coffee  I     Week Starting:____________________________________  Day Daytime  Voids Nighttime  Voids Urgency for the Day (none, mild, strong, severe) Number of Accidents/ Leaks Beverages Comments                                                           This week my symptoms were:  O much better O better O the same O worse   

## 2022-06-03 NOTE — Progress Notes (Signed)
PTNS  Session # 22 of 45  Health & Social Factors: No change Caffeine: 0 Alcohol: 0 Daytime voids #per day: 5-6 Night-time voids #per night: 5-6 Urgency: Mild Incontinence Episodes #per day: 2 in the last month Ankle used: Right Treatment Setting: 6 Feeling/ Response: sensory in the foot  Performed By: Mervin Hack, Cma and Sevyn Paredez Magallon-Mariche,RMA  Follow Up: 1 month

## 2022-07-01 ENCOUNTER — Ambulatory Visit (INDEPENDENT_AMBULATORY_CARE_PROVIDER_SITE_OTHER): Payer: 59 | Admitting: Physician Assistant

## 2022-07-01 DIAGNOSIS — N3941 Urge incontinence: Secondary | ICD-10-CM

## 2022-07-01 DIAGNOSIS — N3946 Mixed incontinence: Secondary | ICD-10-CM | POA: Diagnosis not present

## 2022-07-01 NOTE — Progress Notes (Signed)
PTNS   Session # 23 of 45   Health & Social Factors: No change Caffeine: 0 Alcohol: 0 Daytime voids #per day: 5-6 Night-time voids #per night: 5-6 Urgency: Mild Incontinence Episodes #per day: 2 in the last month Ankle used: Right Treatment Setting: 8 Feeling/ Response: sensory in the foot   Performed By: Mammie Lorenzo, RN    Follow Up: 1 month

## 2022-07-25 ENCOUNTER — Ambulatory Visit (INDEPENDENT_AMBULATORY_CARE_PROVIDER_SITE_OTHER): Payer: 59 | Admitting: Vascular Surgery

## 2022-07-25 ENCOUNTER — Encounter (INDEPENDENT_AMBULATORY_CARE_PROVIDER_SITE_OTHER): Payer: Self-pay | Admitting: Vascular Surgery

## 2022-07-25 VITALS — BP 108/67 | HR 76 | Resp 18

## 2022-07-25 DIAGNOSIS — I83812 Varicose veins of left lower extremities with pain: Secondary | ICD-10-CM | POA: Diagnosis not present

## 2022-07-25 DIAGNOSIS — I83819 Varicose veins of unspecified lower extremities with pain: Secondary | ICD-10-CM

## 2022-07-25 NOTE — Progress Notes (Signed)
    MRN : [id number]  [name] is a 89 y.o. (Jan 06, 1933) female who presents with chief complaint of No chief complaint on file. .    The patient's left lower extremity was sterilely prepped and draped.  The ultrasound machine was used to visualize the left great saphenous vein throughout its course.  A segment in the mid thigh was selected for access.  The saphenous vein was accessed without difficulty using ultrasound guidance with a micropuncture needle.   An 0.018  wire was placed beyond the saphenofemoral junction through the sheath and the microneedle was removed.  The 65 cm sheath was then placed over the wire and the wire and dilator were removed.  The laser fiber was placed through the sheath and its tip was placed approximately 2 cm below the saphenofemoral junction.  Tumescent anesthesia was then created with a dilute lidocaine solution.  Laser energy was then delivered with constant withdrawal of the sheath and laser fiber.  Approximately 680 Joules of energy were delivered over a length of 11 cm.  Sterile dressings were placed.  The patient tolerated the procedure well without complications.

## 2022-07-30 ENCOUNTER — Other Ambulatory Visit (INDEPENDENT_AMBULATORY_CARE_PROVIDER_SITE_OTHER): Payer: Self-pay | Admitting: Vascular Surgery

## 2022-07-30 DIAGNOSIS — I83813 Varicose veins of bilateral lower extremities with pain: Secondary | ICD-10-CM

## 2022-08-01 ENCOUNTER — Ambulatory Visit (INDEPENDENT_AMBULATORY_CARE_PROVIDER_SITE_OTHER): Payer: 59

## 2022-08-01 DIAGNOSIS — I83813 Varicose veins of bilateral lower extremities with pain: Secondary | ICD-10-CM

## 2022-08-05 ENCOUNTER — Encounter: Payer: Self-pay | Admitting: Physician Assistant

## 2022-08-05 ENCOUNTER — Ambulatory Visit (INDEPENDENT_AMBULATORY_CARE_PROVIDER_SITE_OTHER): Payer: 59 | Admitting: Physician Assistant

## 2022-08-05 DIAGNOSIS — N3946 Mixed incontinence: Secondary | ICD-10-CM

## 2022-08-05 NOTE — Progress Notes (Addendum)
Patient ID: [name], female   DOB: 07/15/1932, 89 y.o.   MRN: [id number] PTNS  Session # 24 of 45  Health & Social Factors: no change Caffeine: none Alcohol: none Daytime voids #per day: 8 Night-time voids #per night: 4 Urgency: mild Incontinence Episodes #per day: 0 Ankle used: right Treatment Setting: 19 Feeling/ Response: sensory Comments: pt tolerated well  Performed By: Mervin Hack, CMA  Follow Up: 

## 2022-08-22 ENCOUNTER — Ambulatory Visit (INDEPENDENT_AMBULATORY_CARE_PROVIDER_SITE_OTHER): Payer: 59 | Admitting: Vascular Surgery

## 2022-08-22 ENCOUNTER — Encounter (INDEPENDENT_AMBULATORY_CARE_PROVIDER_SITE_OTHER): Payer: Self-pay | Admitting: Vascular Surgery

## 2022-08-22 VITALS — BP 112/70 | HR 98 | Resp 16 | Wt 153.2 lb

## 2022-08-22 DIAGNOSIS — I2510 Atherosclerotic heart disease of native coronary artery without angina pectoris: Secondary | ICD-10-CM | POA: Diagnosis not present

## 2022-08-22 DIAGNOSIS — E785 Hyperlipidemia, unspecified: Secondary | ICD-10-CM

## 2022-08-22 DIAGNOSIS — I872 Venous insufficiency (chronic) (peripheral): Secondary | ICD-10-CM | POA: Diagnosis not present

## 2022-08-22 DIAGNOSIS — I83819 Varicose veins of unspecified lower extremities with pain: Secondary | ICD-10-CM

## 2022-08-22 NOTE — Progress Notes (Signed)
MRN : [id number]  [name] is a 89 y.o. (04-Feb-1933) female who presents with chief complaint of varicose veins hurt.  History of Present Illness:   The patient returns to the office for followup status post laser ablation of the left great saphenous vein on 07/25/2022.  The patient note significant improvement in the lower extremity pain but not resolution of the symptoms. The patient notes multiple residual varicosities bilaterally which continued to hurt with dependent positions and remained tender to palpation. The patient's swelling is minimally from preoperative status. The patient continues to wear graduated compression stockings on a daily basis but these are not eliminating the pain and discomfort. The patient continues to use over-the-counter anti-inflammatory medications to treat the pain and related symptoms but this has not given the patient relief. The patient notes the pain in the lower extremities is causing problems with daily exercise, problems at work and even with household activities such as preparing meals and doing dishes.  The patient is otherwise done well and there have been no complications related to the laser procedure or interval changes in the patient's overall   Post laser ultrasound dated 08/01/2022 shows successful ablation of the left great saphenous vein   No outpatient medications have been marked as taking for the 08/22/22 encounter (Appointment) with Gilda Crease, Latina Craver, MD.    Past Medical History:  Diagnosis Date   [facility])    a.) CHA2DS2-VASc = 6 (age x 2, sex, CHF, HTN, aortic plaque). b.) rate/rhythm maintained without pharmacological intervention; she is not on oral anticoagulation   Anxiety    Aortic atherosclerosis (HCC)    Arthritis    Basal cell carcinoma 11/11/2017   Right inferior knee. Superficial and nodular patterns.   Bilateral lower extremity edema    CKD (chronic kidney disease), stage III (HCC)    Congestive  heart failure (CHF) (HCC) 05/31/2014   a.) TTE 05/31/2014: EF >55%, RVE; LAE; triv panvalvular regurg; G1DD. b.) TTE 06/29/2017: EF >55%; RVE; BAE; G1DD. c.) TTE 05/09/2018: EF 55-60%; LAE; mod MR, mild AR/TR. d.) TTE 04/29/2020: EF 60-65%; mi;d-mod MR/TR; RVSP 36.6 mmHg. e.) TTE 08/24/2020: EF >55%; mild LVH; mild MR/TR/PR; sev MAC.   COPD (chronic obstructive pulmonary disease) (HCC)    Coronary artery disease 06/03/2008   a.) LHC 06/03/2008: 40% pLAD-1, 60% pLAD-2, 90% mLAD-1, 30% mLAD-2, 50% mLAD-3 --> PCI placing a 2.0 x 12 mm Mini-Vision BMS x 1 to mLAD-1 lesion. b.) LHC 02/12/10: 50% pLAD, 30% ISR mLAD, 25% dLAD, 25% pLCx, 30% mLCx, 20% pRCA, 20% D1; further intervention deferred opting for medical mgmt.   DDD (degenerative disc disease), lumbar    Depression    GERD (gastroesophageal reflux disease)    HOH (hard of hearing)    a.) uses BILATERAL hearing aids   Hurthle cell neoplasm of thyroid    a.) s/p total thyroidectomy 11/17/2019   Hyperlipemia    Hypertension    Hypothyroidism    Lumbar spinal stenosis    Memory loss    Non Hodgkin's lymphoma (HCC) 2002   Squamous cell carcinoma of skin 06/13/2020   L mid pretibia, biopsy only   Urinary incontinence     Past Surgical History:  Procedure Laterality Date   CORONARY ANGIOPLASTY WITH STENT PLACEMENT Left 06/03/2008   Procedure: CORONARY ANGIOPLASTY WITH STENT PLACEMENT; Location: [facility]; Surgeon(s): Arnoldo Hooker, MD (diagnostic) and Rudean Hitt, MD (interventional)   EXCISION OF  ABDOMINAL WALL TUMOR Right 05/08/2018   Procedure: EXCISION OF ABDOMINAL WALL-RIGHT;  Surgeon: Carolan Shiver, MD;  Location: [facility];  Service: General;  Laterality: Right;   INCISIONAL HERNIA REPAIR N/A 05/08/2018   Procedure: LAPAROSCOPIC VS. OPEN INCISIONAL HERNIA REPAIR WITH MESH;  Surgeon: Carolan Shiver, MD;  Location: [facility];  Service: General;  Laterality: N/A;   LEFT HEART CATH AND CORONARY ANGIOGRAPHY Left 02/12/2010    Procedure: LEFT HEART CATH AND CORONARY ANGIOGRAPHY; Location: [facility]; Surgeon: Arnoldo Hooker, MD   LESION REMOVAL Left 08/22/2021   Procedure: LESION REMOVAL (KNEE);  Surgeon: Renford Dills, MD;  Location: [facility];  Service: Vascular;  Laterality: Left;   THYROIDECTOMY Bilateral 11/17/2019   Procedure: TOTAL THYROIDECTOMY;  Surgeon: Vernie Murders, MD;  Location: [facility];  Service: ENT;  Laterality: Bilateral;   TOTAL ABDOMINAL HYSTERECTOMY N/A    VEIN SURGERY     Endovenous ablation of saphenous vein    Social History Social History   Tobacco Use   Smoking status: Never    Passive exposure: Never   Smokeless tobacco: Never  Vaping Use   Vaping Use: Never used  Substance Use Topics   Alcohol use: No   Drug use: No    Family History Family History  Problem Relation Age of Onset   Heart Problems Mother    Heart Problems Father    Heart attack Father    Diabetes Brother    Diabetes Brother     Allergies  Allergen Reactions   Cefdinir Other (See Comments)    Tolerated cefazolin 2020   Sulfa Antibiotics Hives and Rash     REVIEW OF SYSTEMS (Negative unless checked)  Constitutional: [] Weight loss  [] Fever  [] Chills Cardiac: [] Chest pain   [] Chest pressure   [] Palpitations   [] Shortness of breath when laying flat   [] Shortness of breath with exertion. Vascular:  [] Pain in legs with walking   [x] Pain in legs with standing  [] History of DVT   [] Phlebitis   [] Swelling in legs   [x] Varicose veins   [] Non-healing ulcers Pulmonary:   [] Uses home oxygen   [] Productive cough   [] Hemoptysis   [] Wheeze  [] COPD   [] Asthma Neurologic:  [] Dizziness   [] Seizures   [] History of stroke   [] History of TIA  [] Aphasia   [] Vissual changes   [] Weakness or numbness in arm   [x] Weakness or numbness in leg Musculoskeletal:   [] Joint swelling   [] Joint pain   [x] Low back pain Hematologic:  [] Easy bruising  [] Easy bleeding   [] Hypercoagulable state   [] Anemic Gastrointestinal:  [] Diarrhea    [] Vomiting  [x] Gastroesophageal reflux/heartburn   [] Difficulty swallowing. Genitourinary:  [] Chronic kidney disease   [] Difficult urination  [] Frequent urination   [] Blood in urine Skin:  [] Rashes   [] Ulcers  Psychological:  [] History of anxiety   []  History of major depression.  Physical Examination  There were no vitals filed for this visit. There is no height or weight on file to calculate BMI. Gen: WD/WN, NAD Head: [place]/AT, No temporalis wasting.  Ear/Nose/Throat: Hearing grossly intact, nares w/o erythema or drainage, pinna without lesions Eyes: PER, EOMI, sclera nonicteric.  Neck: Supple, no gross masses.  No JVD.  Pulmonary:  Good air movement, no audible wheezing, no use of accessory muscles.  Cardiac: RRR, precordium not hyperdynamic. Vascular:  Large varicosities present, greater than 10 mm left leg.  Veins are tender to palpation  Mild venous stasis changes to the legs bilaterally.  Trace soft pitting edema CEAP C3sEpAsPr Vessel  Right Left  Radial Palpable Palpable  Gastrointestinal: soft, non-distended. No guarding/no peritoneal signs.  Musculoskeletal: M/S 5/5 throughout.  No deformity.  Neurologic: CN 2-12 intact. Pain and light touch intact in extremities.  Symmetrical.  Speech is fluent. Motor exam as listed above. Psychiatric: Judgment intact, Mood & affect appropriate for pt's clinical situation. Dermatologic: Venous rashes no ulcers noted.  No changes consistent with cellulitis. Lymph : No lichenification or skin changes of chronic lymphedema.  CBC Lab Results  Component Value Date   WBC 5.5 08/10/2021   HGB 13.8 08/10/2021   HCT 41.7 08/10/2021   MCV 94.1 08/10/2021   PLT 266 08/10/2021    BMET    Component Value Date/Time   NA 140 08/10/2021 1553   NA 139 01/30/2015 0932   NA 138 06/21/2014 0503   K 3.7 08/10/2021 1553   K 3.9 06/21/2014 0503   CL 101 08/10/2021 1553   CL 107 06/21/2014 0503   CO2 30 08/10/2021 1553   CO2 27 06/21/2014 0503    GLUCOSE 104 (H) 08/10/2021 1553   GLUCOSE 100 (H) 06/21/2014 0503   BUN 34 (H) 08/10/2021 1553   BUN 17 01/30/2015 0932   BUN 21 (H) 06/21/2014 0503   CREATININE 1.39 (H) 08/10/2021 1553   CREATININE 1.22 (H) 06/21/2014 0503   CALCIUM 9.0 08/10/2021 1553   CALCIUM 8.6 (L) 06/21/2014 0503   GFRNONAA 37 (L) 08/10/2021 1553   GFRNONAA 42 (L) 06/21/2014 0503   GFRAA 43 (L) 11/05/2019 1003   GFRAA 49 (L) 06/21/2014 0503   CrCl cannot be calculated (Patient's most recent lab result is older than the maximum 21 days allowed.).  COAG Lab Results  Component Value Date   INR 1.1 09/21/2019    Radiology VAS [place] LOWER EXTREMITY VENOUS POST ABLATION  Result Date: 08/08/2022  Lower Venous DVT Study Patient Name:  [name]  Date of Exam:   08/01/2022 Medical Rec #: [id number]       Accession #:    [id number] Date of Birth: 12-29-1932       Patient Gender: F Patient Age:   77 years Exam Location:  [facility] Procedure:      VAS [place] LOWER EXTREMITY VENOUS (DVT) Referring Phys: Levora Dredge --------------------------------------------------------------------------------  Indications: Pain, and Swelling.  Performing Technologist: Hardie Lora RVT  Examination Guidelines: A complete evaluation includes B-mode imaging, spectral Doppler, color Doppler, and power Doppler as needed of all accessible portions of each vessel. Bilateral testing is considered an integral part of a complete examination. Limited examinations for reoccurring indications may be performed as noted. The reflux portion of the exam is performed with the patient in reverse Trendelenburg.  Post Ablation: Follow up evaluation status post ablation of left Great Saphenous Vein on 07/25/2022.  +-----+---------------+---------+-----------+----------+--------------+ RIGHTCompressibilityPhasicitySpontaneityPropertiesThrombus Aging +-----+---------------+---------+-----------+----------+--------------+ CFV  Full            Yes      Yes                                 +-----+---------------+---------+-----------+----------+--------------+   +----+---------------+---------+-----------+----------+--------------+ LEFTCompressibilityPhasicitySpontaneityPropertiesThrombus Aging +----+---------------+---------+-----------+----------+--------------+ CFV Full           Yes      Yes                                 +----+---------------+---------+-----------+----------+--------------+ SFJ Full                                                        +----+---------------+---------+-----------+----------+--------------+  GSV None                                                        +----+---------------+---------+-----------+----------+--------------+     Summary: LEFT: - Successful ablation of left great saphenous vein.  Post Ablation Summary: - Successful ablation of the left Great Saphenous Vein from the Distal thigh to within 1.5 cm of CFV.  *See table(s) above for measurements and observations. Electronically signed by Levora Dredge MD on 08/08/2022 at 9:00:28 AM.    Final      Assessment/Plan 1. Varicose veins with pain Recommend:  The patient has had successful ablation of the previously incompetent left saphenous venous system but still has persistent symptoms of pain and swelling that are having a negative impact on daily life and daily activities.  Patient should undergo injection sclerotherapy to treat the residual varicosities.  The risks, benefits and alternative therapies were reviewed in detail with the patient.  All questions were answered.  The patient agrees to proceed with sclerotherapy at their convenience.  The patient will continue wearing the graduated compression stockings and using the over-the-counter pain medications to treat her symptoms.   2. Arteriosclerosis of coronary artery Continue cardiac and antihypertensive medications as already ordered and reviewed, no changes  at this time.  Continue statin as ordered and reviewed, no changes at this time  Nitrates PRN for chest pain  3. Chronic venous insufficiency See #1  4. Hyperlipidemia, unspecified hyperlipidemia type Continue statin as ordered and reviewed, no changes at this time    Levora Dredge, MD  08/22/2022 1:29 PM

## 2022-08-25 ENCOUNTER — Encounter (INDEPENDENT_AMBULATORY_CARE_PROVIDER_SITE_OTHER): Payer: Self-pay | Admitting: Vascular Surgery

## 2022-09-02 ENCOUNTER — Ambulatory Visit: Payer: 59

## 2022-09-12 ENCOUNTER — Ambulatory Visit (INDEPENDENT_AMBULATORY_CARE_PROVIDER_SITE_OTHER): Payer: 59 | Admitting: Dermatology

## 2022-09-12 DIAGNOSIS — W908XXA Exposure to other nonionizing radiation, initial encounter: Secondary | ICD-10-CM | POA: Diagnosis not present

## 2022-09-12 DIAGNOSIS — L570 Actinic keratosis: Secondary | ICD-10-CM | POA: Diagnosis not present

## 2022-09-12 DIAGNOSIS — L720 Epidermal cyst: Secondary | ICD-10-CM

## 2022-09-12 DIAGNOSIS — L719 Rosacea, unspecified: Secondary | ICD-10-CM | POA: Diagnosis not present

## 2022-09-12 DIAGNOSIS — L723 Sebaceous cyst: Secondary | ICD-10-CM

## 2022-09-12 MED ORDER — DOXYCYCLINE MONOHYDRATE 100 MG PO TABS
100.0000 mg | ORAL_TABLET | Freq: Two times a day (BID) | ORAL | 0 refills | Status: AC
Start: 2022-09-12 — End: 2022-09-26

## 2022-09-12 MED ORDER — METRONIDAZOLE 0.75 % EX CREA
TOPICAL_CREAM | Freq: Two times a day (BID) | CUTANEOUS | 1 refills | Status: DC
Start: 2022-09-12 — End: 2022-12-02

## 2022-09-12 MED ORDER — MUPIROCIN 2 % EX OINT: 1.0000 | TOPICAL_OINTMENT | Freq: Two times a day (BID) | CUTANEOUS | 0 refills | Status: DC

## 2022-09-12 NOTE — Progress Notes (Signed)
Follow-Up Visit   Subjective  [name] is a 89 y.o. female who presents for the following: spot at left lower leg 4 days ago became swollen, tender, and drained. applied peroxide to area. Had bump in area for few months before that. Also has red spots at nose to check.   The following portions of the chart were reviewed this encounter and updated as appropriate: medications, allergies, medical history  Review of Systems:  No other skin or systemic complaints except as noted in HPI or Assessment and Plan.  Objective  Well appearing patient in no apparent distress; mood and affect are within normal limits.  A focused examination was performed of the following areas: Left leg, face  Relevant exam findings are noted in the Assessment and Plan.  lower nasal dorsum x 1 Erythematous thin papules/macules with gritty scale.     Assessment & Plan    INFLAMED EPIDERMAL INCLUSION CYST Exam: 2.5 cm erythematous crusted plaque with punctum at left medial knee   Benign-appearing. Exam most consistent with an epidermal inclusion cyst. Discussed that a cyst is a benign growth that can grow over time and sometimes get irritated or inflamed. Recommend observation if it is not bothersome. Discussed option of surgical excision to remove it if it is growing, symptomatic, or other changes noted. Please call for new or changing lesions so they can be evaluated.  Treatment Plan:  start doxycycline 100 mg tablet by mouth twice a day for 14 days.  0 refills  Start Mupirocin ointment apply topically to affected area at left knee every day/bid and cover with bandage until healed.   Doxycycline should be taken with food to prevent nausea. Do not lay down for 30 minutes after taking. Be cautious with sun exposure and use good sun protection while on this medication. Pregnant women should not take this medication.   RTC if not improving   ROSACEA Exam: erythema with telangectasia at  nose  Chronic and persistent condition with duration or expected duration over one year. Condition is bothersome/symptomatic for patient. Currently flared.  Rosacea is a chronic progressive skin condition usually affecting the face of adults, causing redness and/or acne bumps. It is treatable but not curable. It sometimes affects the eyes (ocular rosacea) as well. It may respond to topical and/or systemic medication and can flare with stress, sun exposure, alcohol, exercise, topical steroids (including hydrocortisone/cortisone 10) and some foods.  Daily application of broad spectrum spf 30+ sunscreen to face is recommended to reduce flares.  Patient denies grittiness of the eyes  Treatment Plan  Apply metronidazole 0.75% cream twice a day to affected areas at face (nose).   Rosacea  Related Medications metroNIDAZOLE (METROCREAM) 0.75 % cream Apply topically 2 (two) times daily. Apply to nose for rosacea  Inflamed epidermoid cyst of skin  Related Medications doxycycline (ADOXA) 100 MG tablet Take 1 tablet (100 mg total) by mouth 2 (two) times daily for 14 days. Take with food. For inflamed cyst  mupirocin ointment (BACTROBAN) 2 % Apply 1 Application topically 2 (two) times daily. Apply to wound at left knee and cover with bandage until healed.  Actinic keratosis lower nasal dorsum x 1  Actinic keratoses are precancerous spots that appear secondary to cumulative UV radiation exposure/sun exposure over time. They are chronic with expected duration over 1 year. A portion of actinic keratoses will progress to squamous cell carcinoma of the skin. It is not possible to reliably predict which spots will progress to skin cancer  and so treatment is recommended to prevent development of skin cancer.  Recommend daily broad spectrum sunscreen SPF 30+ to sun-exposed areas, reapply every 2 hours as needed.  Recommend staying in the shade or wearing long sleeves, sun glasses (UVA+UVB protection) and  wide brim hats (4-inch brim around the entire circumference of the hat). Call for new or changing lesions.  Destruction of lesion - lower nasal dorsum x 1  Destruction method: cryotherapy   Informed consent: discussed and consent obtained   Lesion destroyed using liquid nitrogen: Yes   Region frozen until ice ball extended beyond lesion: Yes   Outcome: patient tolerated procedure well with no complications   Post-procedure details: wound care instructions given   Additional details:  Prior to procedure, discussed risks of blister formation, small wound, skin dyspigmentation, or rare scar following cryotherapy. Recommend Vaseline ointment to treated areas while healing.     Return for keep follow up as scheduled .  I, Asher Muir, CMA, am acting as scribe for Willeen Niece, MD.   Documentation: I have reviewed the above documentation for accuracy and completeness, and I agree with the above.  Willeen Niece, MD

## 2022-09-12 NOTE — Patient Instructions (Addendum)
For inflamed cyst at left knee  Start doxycycline 100 mg tab -  take 1 tab by mouth twice daily with food for 14 days  Doxycycline should be taken with food to prevent nausea. Do not lay down for 30 minutes after taking. Be cautious with sun exposure and use good sun protection while on this medication. Pregnant women should not take this medication.   Start mupirocin ointment - apply topcially to wound at left knee twice and cover with bandage until healed       Start metronidazole 0.75 % cream - apply topically to nose twice daily for rosacea    Rosacea  What is rosacea? Rosacea (say: ro-zay-sha) is a common skin disease that usually begins as a trend of flushing or blushing easily.  As rosacea progresses, a persistent redness in the center of the face will develop and may gradually spread beyond the nose and cheeks to the forehead and chin.  In some cases, the ears, chest, and back could be affected.  Rosacea may appear as tiny blood vessels or small red bumps that occur in crops.  Frequently they can contain pus, and are called "pustules".  If the bumps do not contain pus, they are referred to as "papules".  Rarely, in prolonged, untreated cases of rosacea, the oil glands of the nose and cheeks may become permanently enlarged.  This is called rhinophyma, and is seen more frequently in men.  Signs and Risks In its beginning stages, rosacea tends to come and go, which makes it difficult to recognize.  It can start as intermittent flushing of the face.  Eventually, blood vessels may become permanently visible.  Pustules and papules can appear, but can be mistaken for adult acne.  People of all races, ages, genders and ethnic groups are at risk of developing rosacea.  However, it is more common in women (especially around menopause) and adults with fair skin between the ages of 53 and 57.  Treatment Dermatologists typically recommend a combination of treatments to effectively manage rosacea.   Treatment can improve symptoms and may stop the progression of the rosacea.  Treatment may involve both topical and oral medications.  The tetracycline antibiotics are often used for their anti-inflammatory effect; however, because of the possibility of developing antibiotic resistance, they should not be used long term at full dose.  For dilated blood vessels the options include electrodessication (uses electric current through a small needle), laser treatment, and cosmetics to hide the redness.   With all forms of treatment, improvement is a slow process, and patients may not see any results for the first 3-4 weeks.  It is very important to avoid the sun and other triggers.  Patients must wear sunscreen daily.  Skin Care Instructions: Cleanse the skin with a mild soap such as CeraVe cleanser, Cetaphil cleanser, or Dove soap once or twice daily as needed. Moisturize with Eucerin Redness Relief Daily Perfecting Lotion (has a subtle green tint), CeraVe Moisturizing Cream, or Oil of Olay Daily Moisturizer with sunscreen every morning and/or night as recommended. Makeup should be "non-comedogenic" (won't clog pores) and be labeled "for sensitive skin". Good choices for cosmetics are: Neutrogena, Almay, and Physician's Formula.  Any product with a green tint tends to offset a red complexion. If your eyes are dry and irritated, use artificial tears 2-3 times per day and cleanse the eyelids daily with baby shampoo.  Have your eyes examined at least every 2 years.  Be sure to tell your eye doctor  that you have rosacea. Alcoholic beverages tend to cause flushing of the skin, and may make rosacea worse. Always wear sunscreen, protect your skin from extreme hot and cold temperatures, and avoid spicy foods, hot drinks, and mechanical irritation such as rubbing, scrubbing, or massaging the face.  Avoid harsh skin cleansers, cleansing masks, astringents, and exfoliation. If a particular product burns or makes your face  feel tight, then it is likely to flare your rosacea. If you are having difficulty finding a sunscreen that you can tolerate, you may try switching to a chemical-free sunscreen.  These are ones whose active ingredient is zinc oxide or titanium dioxide only.  They should also be fragrance free, non-comedogenic, and labeled for sensitive skin. Rosacea triggers may vary from person to person.  There are a variety of foods that have been reported to trigger rosacea.  Some patients find that keeping a diary of what they were doing when they flared helps them avoid triggers.         Melanoma ABCDEs  Melanoma is the most dangerous type of skin cancer, and is the leading cause of death from skin disease.  You are more likely to develop melanoma if you: Have light-colored skin, light-colored eyes, or red or blond hair Spend a lot of time in the sun Tan regularly, either outdoors or in a tanning bed Have had blistering sunburns, especially during childhood Have a close family member who has had a melanoma Have atypical moles or large birthmarks  Early detection of melanoma is key since treatment is typically straightforward and cure rates are extremely high if we catch it early.   The first sign of melanoma is often a change in a mole or a new dark spot.  The ABCDE system is a way of remembering the signs of melanoma.  A for asymmetry:  The two halves do not match. B for border:  The edges of the growth are irregular. C for color:  A mixture of colors are present instead of an even brown color. D for diameter:  Melanomas are usually (but not always) greater than 6mm - the size of a pencil eraser. E for evolution:  The spot keeps changing in size, shape, and color.  Please check your skin once per month between visits. You can use a small mirror in front and a large mirror behind you to keep an eye on the back side or your body.   If you see any new or changing lesions before your next  follow-up, please call to schedule a visit.  Please continue daily skin protection including broad spectrum sunscreen SPF 30+ to sun-exposed areas, reapplying every 2 hours as needed when you're outdoors.   Staying in the shade or wearing long sleeves, sun glasses (UVA+UVB protection) and wide brim hats (4-inch brim around the entire circumference of the hat) are also recommended for sun protection.        Due to recent changes in healthcare laws, you may see results of your pathology and/or laboratory studies on MyChart before the doctors have had a chance to review them. We understand that in some cases there may be results that are confusing or concerning to you. Please understand that not all results are received at the same time and often the doctors may need to interpret multiple results in order to provide you with the best plan of care or course of treatment. Therefore, we ask that you please give [place] 2 business days to thoroughly review all  your results before contacting the office for clarification. Should we see a critical lab result, you will be contacted sooner.   If You Need Anything After Your Visit  If you have any questions or concerns for your doctor, please call our main line at [phone] and press option 4 to reach your doctor's medical assistant. If no one answers, please leave a voicemail as directed and we will return your call as soon as possible. Messages left after 4 pm will be answered the following business day.   You may also send [place] a message via MyChart. We typically respond to MyChart messages within 1-2 business days.  For prescription refills, please ask your pharmacy to contact our office. Our fax number is [phone].  If you have an urgent issue when the clinic is closed that cannot wait until the next business day, you can page your doctor at the number below.    Please note that while we do our best to be available for urgent issues outside of office  hours, we are not available 24/7.   If you have an urgent issue and are unable to reach [place], you may choose to seek medical care at your doctor's office, retail clinic, urgent care center, or emergency room.  If you have a medical emergency, please immediately call 911 or go to the emergency department.  Pager Numbers  - Dr. Gwen Pounds: [phone]  - Dr. Neale Burly: [phone]  - Dr. Roseanne Reno: [phone]  In the event of inclement weather, please call our main line at [phone] for an update on the status of any delays or closures.  Dermatology Medication Tips: Please keep the boxes that topical medications come in in order to help keep track of the instructions about where and how to use these. Pharmacies typically print the medication instructions only on the boxes and not directly on the medication tubes.   If your medication is too expensive, please contact our office at [phone] option 4 or send [place] a message through MyChart.   We are unable to tell what your co-pay for medications will be in advance as this is different depending on your insurance coverage. However, we may be able to find a substitute medication at lower cost or fill out paperwork to get insurance to cover a needed medication.   If a prior authorization is required to get your medication covered by your insurance company, please allow [place] 1-2 business days to complete this process.  Drug prices often vary depending on where the prescription is filled and some pharmacies may offer cheaper prices.  The website www.goodrx.com contains coupons for medications through different pharmacies. The prices here do not account for what the cost may be with help from insurance (it may be cheaper with your insurance), but the website can give you the price if you did not use any insurance.  - You can print the associated coupon and take it with your prescription to the pharmacy.  - You may also stop by our office during regular  business hours and pick up a GoodRx coupon card.  - If you need your prescription sent electronically to a different pharmacy, notify our office through [facility] or by phone at [phone] option 4.     Si Usted Necesita Algo Despus de Su Visita  Tambin puede enviarnos un mensaje a travs de Clinical cytogeneticist. Por lo general respondemos a los mensajes de MyChart en el transcurso de 1 a 2 das hbiles.  Para renovar recetas, por  favor pida a su farmacia que se ponga en contacto con nuestra oficina. Annie Sable de fax es [place] [phone].  Si tiene un asunto urgente cuando la clnica est cerrada y que no puede esperar hasta el siguiente da hbil, puede llamar/localizar a su doctor(a) al nmero que aparece a continuacin.   Por favor, tenga en cuenta que aunque hacemos todo lo posible para estar disponibles para asuntos urgentes fuera del horario de [place], no estamos disponibles las 24 horas del da, los 7 [address]  [address] de la [place].   Si tiene un problema urgente y no puede comunicarse con nosotros, puede optar por buscar atencin mdica  en el consultorio de su doctor(a), en una clnica privada, en un centro de atencin urgente o en una sala de emergencias.  Si tiene Engineer, drilling, por favor llame inmediatamente al 911 o vaya a la sala de emergencias.  Nmeros de bper  - Dr. Gwen Pounds: [phone]  - Dra. Moye: [phone]  - Dra. Roseanne Reno: [phone]  En caso de inclemencias del [place], por favor llame a Lacy Duverney principal al [phone] para una actualizacin sobre el [place] de cualquier retraso o cierre.  Consejos para la medicacin en dermatologa: Por favor, guarde las cajas en las que vienen los medicamentos de uso tpico para ayudarle a seguir las instrucciones sobre dnde y cmo usarlos. Las farmacias generalmente imprimen las instrucciones del medicamento slo en las cajas y no directamente en los tubos del [place].   Si su medicamento es muy caro, por  favor, pngase en contacto con Rolm Gala llamando al [phone] y presione la opcin 4 o envenos un mensaje a travs de Clinical cytogeneticist.   No podemos decirle cul ser su copago por los medicamentos por adelantado ya que esto es diferente dependiendo de la cobertura de su seguro. Sin embargo, es posible que podamos encontrar un medicamento sustituto a Audiological scientist un formulario para que el seguro cubra el medicamento que se considera necesario.   Si se requiere una autorizacin previa para que su compaa de seguros [place] su medicamento, por favor permtanos de 1 a 2 das hbiles para completar [address]  Los precios de los medicamentos varan con frecuencia dependiendo del Environmental consultant de dnde se surte la receta y alguna farmacias pueden ofrecer precios ms baratos.  El sitio web www.goodrx.com tiene cupones para medicamentos de Health and safety inspector. Los precios aqu no tienen en cuenta lo que podra costar con la ayuda del seguro (puede ser ms barato con su seguro), pero el sitio web puede darle el precio si no utiliz Tourist information centre manager.  - Puede imprimir el cupn correspondiente y llevarlo con su receta a la farmacia.  - Tambin puede pasar por nuestra oficina durante el horario de atencin regular y Education officer, museum una tarjeta de cupones de GoodRx.  - Si necesita que su receta se enve electrnicamente a una farmacia diferente, informe a nuestra oficina a travs de MyChart de  o por telfono llamando al [phone] y presione la opcin 4.

## 2022-09-16 ENCOUNTER — Ambulatory Visit (INDEPENDENT_AMBULATORY_CARE_PROVIDER_SITE_OTHER): Payer: 59 | Admitting: Physician Assistant

## 2022-09-16 DIAGNOSIS — N3946 Mixed incontinence: Secondary | ICD-10-CM

## 2022-09-16 NOTE — Progress Notes (Signed)
PTNS  Session # 25  Health & Social Factors: no change Caffeine: 0 Alcohol: 0 Daytime voids #per day: 8 Night-time voids #per night: 4 Urgency: mild Incontinence Episodes #per day: 0 Ankle used: right Treatment Setting: 3 Feeling/ Response: sensory Comments: Patient tolerated well.  Performed By: Carman Ching, PA-C   Follow Up: 1 month for maintenance PTNS

## 2022-10-14 ENCOUNTER — Ambulatory Visit: Payer: 59 | Admitting: Physician Assistant

## 2022-10-14 DIAGNOSIS — N3946 Mixed incontinence: Secondary | ICD-10-CM

## 2022-10-14 NOTE — Progress Notes (Signed)
PTNS  Session # 26  Health & Social Factors: urinate in clothes Caffeine: 1 Alcohol: none Daytime voids #per day: 10-11 Night-time voids #per night: 3-4 Urgency: mild Incontinence Episodes #per day: none Ankle used: right Treatment Setting: 5 Feeling/ Response: sensory Comments: patient tolerated well  Performed By: Carman Ching, PA-C   Follow Up: Return in about 4 weeks (around 11/11/2022) for PTNS maintenance.

## 2022-10-14 NOTE — Patient Instructions (Signed)

## 2022-10-25 ENCOUNTER — Ambulatory Visit (INDEPENDENT_AMBULATORY_CARE_PROVIDER_SITE_OTHER): Payer: 59 | Admitting: Nurse Practitioner

## 2022-10-25 ENCOUNTER — Encounter (INDEPENDENT_AMBULATORY_CARE_PROVIDER_SITE_OTHER): Payer: Self-pay | Admitting: Nurse Practitioner

## 2022-10-25 VITALS — BP 130/75 | HR 84 | Resp 18 | Ht 61.0 in | Wt 153.0 lb

## 2022-10-25 DIAGNOSIS — I8312 Varicose veins of left lower extremity with inflammation: Secondary | ICD-10-CM

## 2022-10-25 DIAGNOSIS — I83819 Varicose veins of unspecified lower extremities with pain: Secondary | ICD-10-CM | POA: Diagnosis not present

## 2022-10-25 DIAGNOSIS — I8311 Varicose veins of right lower extremity with inflammation: Secondary | ICD-10-CM | POA: Diagnosis not present

## 2022-10-28 ENCOUNTER — Encounter (INDEPENDENT_AMBULATORY_CARE_PROVIDER_SITE_OTHER): Payer: Self-pay | Admitting: Nurse Practitioner

## 2022-10-28 ENCOUNTER — Other Ambulatory Visit: Payer: Self-pay | Admitting: Dermatology

## 2022-10-28 DIAGNOSIS — L723 Sebaceous cyst: Secondary | ICD-10-CM

## 2022-10-28 NOTE — Progress Notes (Signed)
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   Procedure: Sclerotherapy using hypertonic saline mixed with 1% Lidocaine was performed on the bilateral lower extremity. Compression wraps were placed. The patient tolerated the procedure well. 

## 2022-11-14 ENCOUNTER — Ambulatory Visit: Payer: 59 | Admitting: Physician Assistant

## 2022-11-14 ENCOUNTER — Ambulatory Visit (INDEPENDENT_AMBULATORY_CARE_PROVIDER_SITE_OTHER): Payer: 59 | Admitting: Physician Assistant

## 2022-11-14 DIAGNOSIS — N3946 Mixed incontinence: Secondary | ICD-10-CM | POA: Diagnosis not present

## 2022-11-14 NOTE — Progress Notes (Signed)
PTNS  Session # 27  Health & Social Factors: no change Caffeine: 0 Alcohol: 0 Daytime voids #per day: 6-7 Night-time voids #per night: 5-7 Urgency: can't answer, says it's variable Incontinence Episodes #per day: 0-2 Ankle used: right Treatment Setting: 18 Feeling/ Response: sensory Comments: Patient tolerated well.  Performed By: Carman Ching, PA-C   Follow Up: Return in about 4 weeks (around 12/12/2022) for PTNS maintenance.

## 2022-11-26 ENCOUNTER — Ambulatory Visit (INDEPENDENT_AMBULATORY_CARE_PROVIDER_SITE_OTHER): Payer: 59 | Admitting: Nurse Practitioner

## 2022-11-26 ENCOUNTER — Encounter (INDEPENDENT_AMBULATORY_CARE_PROVIDER_SITE_OTHER): Payer: Self-pay | Admitting: Nurse Practitioner

## 2022-11-26 VITALS — BP 110/67 | HR 89 | Resp 18 | Ht 61.0 in | Wt 153.0 lb

## 2022-11-26 DIAGNOSIS — I83819 Varicose veins of unspecified lower extremities with pain: Secondary | ICD-10-CM

## 2022-11-26 DIAGNOSIS — I8312 Varicose veins of left lower extremity with inflammation: Secondary | ICD-10-CM

## 2022-11-26 DIAGNOSIS — I8311 Varicose veins of right lower extremity with inflammation: Secondary | ICD-10-CM

## 2022-11-26 NOTE — Progress Notes (Signed)
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   Procedure: Sclerotherapy using hypertonic saline mixed with 1% Lidocaine was performed on the bilateral lower extremity. Compression wraps were placed. The patient tolerated the procedure well. 

## 2022-11-29 ENCOUNTER — Other Ambulatory Visit: Payer: Self-pay | Admitting: Dermatology

## 2022-11-29 DIAGNOSIS — L723 Sebaceous cyst: Secondary | ICD-10-CM

## 2022-11-29 DIAGNOSIS — L719 Rosacea, unspecified: Secondary | ICD-10-CM

## 2022-12-16 ENCOUNTER — Ambulatory Visit (INDEPENDENT_AMBULATORY_CARE_PROVIDER_SITE_OTHER): Payer: 59 | Admitting: Physician Assistant

## 2022-12-16 DIAGNOSIS — N3946 Mixed incontinence: Secondary | ICD-10-CM

## 2022-12-16 NOTE — Progress Notes (Signed)
PTNS  Session # 28  Health & Social Factors: no change Caffeine: 0 Alcohol: 0 Daytime voids #per day: 8 Night-time voids #per night: 3-4 Urgency: mild Incontinence Episodes #per day: 2 Ankle used: right Treatment Setting: 7 Feeling/ Response: toe flex Comments: Patient tolerated well.  Performed By: Carman Ching, PA-C   Follow Up: Return in about 4 weeks (around 01/13/2023) for PTNS maintenance.

## 2022-12-25 ENCOUNTER — Ambulatory Visit (INDEPENDENT_AMBULATORY_CARE_PROVIDER_SITE_OTHER): Payer: 59 | Admitting: Dermatology

## 2022-12-25 ENCOUNTER — Encounter: Payer: Self-pay | Admitting: Dermatology

## 2022-12-25 DIAGNOSIS — R21 Rash and other nonspecific skin eruption: Secondary | ICD-10-CM

## 2022-12-25 DIAGNOSIS — L309 Dermatitis, unspecified: Secondary | ICD-10-CM

## 2022-12-25 DIAGNOSIS — Z7189 Other specified counseling: Secondary | ICD-10-CM | POA: Diagnosis not present

## 2022-12-25 MED ORDER — CLOBETASOL PROPIONATE 0.05 % EX OINT: TOPICAL_OINTMENT | CUTANEOUS | 1 refills | Status: DC

## 2022-12-25 NOTE — Patient Instructions (Signed)
Treatment Plan: Start clobetasol 0.05% ointment twice daily to hands and feet until skin is smooth then restart as needed. Avoid applying to face, groin, and axilla. Use as directed. Long-term use can cause thinning of the skin.  Topical steroids (such as triamcinolone, fluocinolone, fluocinonide, mometasone, clobetasol, halobetasol, betamethasone, hydrocortisone) can cause thinning and lightening of the skin if they are used for too long in the same area. Your physician has selected the right strength medicine for your problem and area affected on the body. Please use your medication only as directed by your physician to prevent side effects.    Due to recent changes in healthcare laws, you may see results of your pathology and/or laboratory studies on MyChart before the doctors have had a chance to review them. We understand that in some cases there may be results that are confusing or concerning to you. Please understand that not all results are received at the same time and often the doctors may need to interpret multiple results in order to provide you with the best plan of care or course of treatment. Therefore, we ask that you please give [place] 2 business days to thoroughly review all your results before contacting the office for clarification. Should we see a critical lab result, you will be contacted sooner.   If You Need Anything After Your Visit  If you have any questions or concerns for your doctor, please call our main line at [phone] and press option 4 to reach your doctor's medical assistant. If no one answers, please leave a voicemail as directed and we will return your call as soon as possible. Messages left after 4 pm will be answered the following business day.   You may also send [place] a message via MyChart. We typically respond to MyChart messages within 1-2 business days.  For prescription refills, please ask your pharmacy to contact our office. Our fax number is  [phone].  If you have an urgent issue when the clinic is closed that cannot wait until the next business day, you can page your doctor at the number below.    Please note that while we do our best to be available for urgent issues outside of office hours, we are not available 24/7.   If you have an urgent issue and are unable to reach [place], you may choose to seek medical care at your doctor's office, retail clinic, urgent care center, or emergency room.  If you have a medical emergency, please immediately call 911 or go to the emergency department.  Pager Numbers  - Dr. Gwen Pounds: [phone]  - Dr. Roseanne Reno: [phone]  - Dr. Katrinka Blazing: [phone]   In the event of inclement weather, please call our main line at [phone] for an update on the status of any delays or closures.  Dermatology Medication Tips: Please keep the boxes that topical medications come in in order to help keep track of the instructions about where and how to use these. Pharmacies typically print the medication instructions only on the boxes and not directly on the medication tubes.   If your medication is too expensive, please contact our office at [phone] option 4 or send [place] a message through MyChart.   We are unable to tell what your co-pay for medications will be in advance as this is different depending on your insurance coverage. However, we may be able to find a substitute medication at lower cost or fill out paperwork to get insurance to cover a needed medication.  If a prior authorization is required to get your medication covered by your insurance company, please allow [place] 1-2 business days to complete this process.  Drug prices often vary depending on where the prescription is filled and some pharmacies may offer cheaper prices.  The website www.goodrx.com contains coupons for medications through different pharmacies. The prices here do not account for what the cost may be with help from  insurance (it may be cheaper with your insurance), but the website can give you the price if you did not use any insurance.  - You can print the associated coupon and take it with your prescription to the pharmacy.  - You may also stop by our office during regular business hours and pick up a GoodRx coupon card.  - If you need your prescription sent electronically to a different pharmacy, notify our office through [facility] or by phone at [phone] option 4.

## 2022-12-25 NOTE — Progress Notes (Signed)
   Follow-Up Visit   Subjective  [name] is a 89 y.o. female who presents for the following: dryness and cracking at feet, some at left thumb and palms. Patient has been soaking feet in warm water then uses pumice stone followed by Aquaphor per podiatrist's recommendations. Patient using neosporin for cracks.   The patient has spots, moles and lesions to be evaluated, some may be new or changing and the patient may have concern these could be cancer.  Patient accompanied by daughter who contributes to history. No fhx psoriasis. Patient saw Dr. Roseanne Reno in July but feet were not bothering her at that time, started shortly after.  The following portions of the chart were reviewed this encounter and updated as appropriate: medications, allergies, medical history  Review of Systems:  No other skin or systemic complaints except as noted in HPI or Assessment and Plan.  Objective  Well appearing patient in no apparent distress; mood and affect are within normal limits.   A focused examination was performed of the following areas: Scalp, arms, hands, feet, arms  Relevant exam findings are noted in the Assessment and Plan.    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 Diffuse hyperkeratosis of bilateral plantar surfaces, spares arch, rim of erythema on medial aspects (L > R) Clear elsewhere               Differential diagnosis:  Palmoplantar dermatitis: typically psoriasis (favoured) vs eczema  Treatment Plan: Start clobetasol 0.05% ointment twice daily to hands and feet until skin is smooth then restart as needed. Cover with socks and cotton gloves. Avoid applying to face, groin, and axilla. Use as directed. Long-term use can cause thinning of the skin. Discussed that biopsy is often non-specific. Could trial  biologic if topicals are not sufficient  Topical steroids (such as triamcinolone, fluocinolone, fluocinonide, mometasone, clobetasol, halobetasol, betamethasone, hydrocortisone) can cause thinning and lightening of the skin if they are used for too long in the same area. Your physician has selected the right strength medicine for your problem and area affected on the body. Please use your medication only as directed by your physician to prevent side effects.   Patient was treated for lymphoma approximately 17 years ago.  Rash  Counseling and coordination of care    Return for TBSE, with Dr. Roseanne Reno, as scheduled, Hx BCC, Hx SCC.  Anise Salvo, RMA, am acting as scribe for Tamara Goody, MD .   Documentation: I have reviewed the above documentation for accuracy and completeness, and I agree with the above.  Tamara Goody, MD

## 2022-12-27 ENCOUNTER — Encounter (INDEPENDENT_AMBULATORY_CARE_PROVIDER_SITE_OTHER): Payer: Self-pay | Admitting: Nurse Practitioner

## 2022-12-27 ENCOUNTER — Ambulatory Visit (INDEPENDENT_AMBULATORY_CARE_PROVIDER_SITE_OTHER): Payer: 59 | Admitting: Nurse Practitioner

## 2022-12-27 VITALS — BP 119/70 | HR 88 | Resp 15 | Wt 153.6 lb

## 2022-12-27 DIAGNOSIS — I83819 Varicose veins of unspecified lower extremities with pain: Secondary | ICD-10-CM

## 2022-12-27 NOTE — Progress Notes (Signed)
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   Procedure: Sclerotherapy using hypertonic saline mixed with 1% Lidocaine was performed on the bilateral lower extremity. Compression wraps were placed. The patient tolerated the procedure well. 

## 2023-01-06 ENCOUNTER — Other Ambulatory Visit: Payer: Self-pay | Admitting: Internal Medicine

## 2023-01-06 DIAGNOSIS — R1011 Right upper quadrant pain: Secondary | ICD-10-CM

## 2023-01-13 ENCOUNTER — Ambulatory Visit
Admission: RE | Admit: 2023-01-13 | Discharge: 2023-01-13 | Disposition: A | Discharge status: Home / Self Care | Payer: 59 | Source: Ambulatory Visit | Attending: Internal Medicine | Admitting: Internal Medicine

## 2023-01-13 DIAGNOSIS — R1011 Right upper quadrant pain: Secondary | ICD-10-CM | POA: Diagnosis present

## 2023-01-20 ENCOUNTER — Ambulatory Visit (INDEPENDENT_AMBULATORY_CARE_PROVIDER_SITE_OTHER): Payer: 59 | Admitting: Physician Assistant

## 2023-01-20 DIAGNOSIS — N3946 Mixed incontinence: Secondary | ICD-10-CM

## 2023-01-20 NOTE — Progress Notes (Signed)
PTNS  Session # 29  Health & Social Factors: no change Caffeine: 0 Alcohol: 0 Daytime voids #per day: 5-6 Night-time voids #per night: 4-6 Urgency: mild Incontinence Episodes #per day: 3-4 Ankle used: right Treatment Setting: 9 Feeling/ Response: sensory and toe flex Comments: n/a  Performed By: Alvina Chou H   Follow Up: 1 month

## 2023-02-04 ENCOUNTER — Other Ambulatory Visit: Payer: Self-pay | Admitting: Student

## 2023-02-04 DIAGNOSIS — M48061 Spinal stenosis, lumbar region without neurogenic claudication: Secondary | ICD-10-CM

## 2023-02-04 DIAGNOSIS — G8929 Other chronic pain: Secondary | ICD-10-CM

## 2023-02-04 DIAGNOSIS — M5416 Radiculopathy, lumbar region: Secondary | ICD-10-CM

## 2023-02-04 DIAGNOSIS — M47816 Spondylosis without myelopathy or radiculopathy, lumbar region: Secondary | ICD-10-CM

## 2023-02-11 ENCOUNTER — Other Ambulatory Visit: Payer: Self-pay | Admitting: Dermatology

## 2023-02-11 ENCOUNTER — Ambulatory Visit: Payer: 59 | Admitting: Dermatology

## 2023-02-11 DIAGNOSIS — Z85828 Personal history of other malignant neoplasm of skin: Secondary | ICD-10-CM

## 2023-02-11 DIAGNOSIS — L578 Other skin changes due to chronic exposure to nonionizing radiation: Secondary | ICD-10-CM | POA: Diagnosis not present

## 2023-02-11 DIAGNOSIS — D225 Melanocytic nevi of trunk: Secondary | ICD-10-CM | POA: Diagnosis not present

## 2023-02-11 DIAGNOSIS — D692 Other nonthrombocytopenic purpura: Secondary | ICD-10-CM

## 2023-02-11 DIAGNOSIS — D485 Neoplasm of uncertain behavior of skin: Secondary | ICD-10-CM

## 2023-02-11 DIAGNOSIS — W908XXA Exposure to other nonionizing radiation, initial encounter: Secondary | ICD-10-CM

## 2023-02-11 DIAGNOSIS — L820 Inflamed seborrheic keratosis: Secondary | ICD-10-CM

## 2023-02-11 DIAGNOSIS — Z1283 Encounter for screening for malignant neoplasm of skin: Secondary | ICD-10-CM | POA: Diagnosis not present

## 2023-02-11 DIAGNOSIS — D1801 Hemangioma of skin and subcutaneous tissue: Secondary | ICD-10-CM

## 2023-02-11 DIAGNOSIS — L720 Epidermal cyst: Secondary | ICD-10-CM

## 2023-02-11 DIAGNOSIS — L821 Other seborrheic keratosis: Secondary | ICD-10-CM

## 2023-02-11 DIAGNOSIS — L309 Dermatitis, unspecified: Secondary | ICD-10-CM

## 2023-02-11 DIAGNOSIS — D492 Neoplasm of unspecified behavior of bone, soft tissue, and skin: Secondary | ICD-10-CM | POA: Diagnosis not present

## 2023-02-11 DIAGNOSIS — L729 Follicular cyst of the skin and subcutaneous tissue, unspecified: Secondary | ICD-10-CM

## 2023-02-11 DIAGNOSIS — D229 Melanocytic nevi, unspecified: Secondary | ICD-10-CM

## 2023-02-11 DIAGNOSIS — L814 Other melanin hyperpigmentation: Secondary | ICD-10-CM

## 2023-02-11 MED ORDER — TACROLIMUS 0.1 % EX OINT: TOPICAL_OINTMENT | CUTANEOUS | 3 refills | Status: DC

## 2023-02-11 NOTE — Progress Notes (Signed)
Follow-Up Visit   Subjective  [name] is a 89 y.o. female who presents for the following: Skin Cancer Screening and Full Body Skin Exam  The patient presents for Total-Body Skin Exam (TBSE) for skin cancer screening and mole check. The patient has spots, moles and lesions to be evaluated, some may be new or changing. History of BCC and SCC. She has psoriasis vs eczema of the palms and feet, improving with clobetasol ointment. She has irritating spots on face at temple and at R back at braline.   Patient accompanied by daughter.   The following portions of the chart were reviewed this encounter and updated as appropriate: medications, allergies, medical history  Review of Systems:  No other skin or systemic complaints except as noted in HPI or Assessment and Plan.  Objective  Well appearing patient in no apparent distress; mood and affect are within normal limits.  A full examination was performed including scalp, head, eyes, ears, nose, lips, neck, chest, axillae, abdomen, back, buttocks, bilateral upper extremities, bilateral lower extremities, hands, feet, fingers, toes, fingernails, and toenails. All findings within normal limits unless otherwise noted below.   Relevant physical exam findings are noted in the Assessment and Plan.  L upper pretibia x 1, R lat mid back x 1, L temple hairline x 1, R lower calf x 3 (6) Erythematous stuck-on, waxy papule Right Lateral Upper Back 0.6 cm flesh brown papule  Assessment & Plan   SKIN CANCER SCREENING PERFORMED TODAY.  ACTINIC DAMAGE - Chronic condition, secondary to cumulative UV/sun exposure - diffuse scaly erythematous macules with underlying dyspigmentation - Recommend daily broad spectrum sunscreen SPF 30+ to sun-exposed areas, reapply every 2 hours as needed.  - Staying in the shade or wearing long sleeves, sun glasses (UVA+UVB protection) and wide brim hats (4-inch brim around the entire circumference of the hat) are also  recommended for sun protection.  - Call for new or changing lesions.  LENTIGINES, SEBORRHEIC KERATOSES, HEMANGIOMAS - Benign normal skin lesions - Benign-appearing - Call for any changes  MELANOCYTIC NEVI - Tan-brown and/or pink-flesh-colored symmetric macules and papules - Benign appearing on exam today - Observation - Call clinic for new or changing moles - Recommend daily use of broad spectrum spf 30+ sunscreen to sun-exposed areas.   History of Basal Cell Carcinoma of the Skin. Right inferior knee. 11/11/2017 - No evidence of recurrence today - Recommend regular full body skin exams - Recommend daily broad spectrum sunscreen SPF 30+ to sun-exposed areas, reapply every 2 hours as needed.  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 06/13/2020 - No evidence of recurrence today - Recommend regular full body skin exams - Recommend daily broad spectrum sunscreen SPF 30+ to sun-exposed areas, reapply every 2 hours as needed.  - Call if any new or changing lesions are noted between office visits  EPIDERMAL INCLUSION CYST Exam: Subcutaneous nodule with punctum at left medial knee, h/o inflammation treated with Doxycyline.  Benign-appearing. Exam most consistent with an epidermal inclusion cyst. Discussed that a cyst is a benign growth that can grow over time and sometimes get irritated or inflamed. Recommend observation if it is not bothersome. Discussed option of surgical excision to remove it if it is growing, symptomatic, or other changes noted. Please call for new or changing lesions so they can be evaluated.  Purpura - Chronic; persistent and recurrent.  Treatable,  but not curable. 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  Improved when compared to baseline photos  Chronic and persistent condition with duration or expected duration over one year. Condition is improving with treatment but not currently at goal.  Hand Dermatitis is a chronic type of eczema that can come and go on the hands and fingers.  While there is no cure, the rash and symptoms can be managed with topical prescription medications, and for more severe cases, with systemic medications.  Recommend mild soap and routine use of moisturizing cream after handwashing.  Minimize soap/water exposure when possible.     Treatment Plan: Start tacrolimus ointment Apply to hands and feet 1-2 times a day until improved dsp 60 g 3Rf. Continue clobetasol ointment once to twice daily as needed to more severe areas and prn flares. Avoid applying to face, groin, and axilla. Use as directed. Long-term use can cause thinning of the skin. Minimize soap/water exposure to hands.  Recommend mild soap and moisturizing cream 1-2 times daily.  Gentle skin care handout provided.  Samples of AmLactin - apply twice daily to feet and hands.   INFLAMED SEBORRHEIC KERATOSIS (6) L upper pretibia x 1, R lat mid back x 1, L temple hairline x 1, R lower calf x 3 (6) vs Aks. Recheck on f/up  Symptomatic, irritating, patient would like treated. Destruction of lesion - L upper pretibia x 1, R lat mid back x 1, L temple hairline x 1, R lower calf x 3 (6)  Destruction method: cryotherapy   Informed consent: discussed and consent obtained   Lesion destroyed using liquid nitrogen: Yes   Region frozen until ice ball extended beyond lesion: Yes   Outcome: patient tolerated procedure well with no complications   Post-procedure details: wound care instructions given   Additional details:  Prior  to procedure, discussed risks of blister formation, small wound, skin dyspigmentation, or rare scar following cryotherapy. Recommend Vaseline ointment to treated areas while healing.  NEOPLASM OF UNCERTAIN BEHAVIOR OF SKIN Right Lateral Upper Back Epidermal / dermal shaving  Lesion diameter (cm):  0.6 Informed consent: discussed and consent obtained   Patient was prepped and draped in usual sterile fashion: Area prepped with alcohol. Anesthesia: the lesion was anesthetized in a standard fashion   Anesthetic:  1% lidocaine w/ epinephrine 1-100,000 buffered w/ 8.4% NaHCO3 Instrument used: flexible razor blade   Hemostasis achieved with: pressure, aluminum chloride and electrodesiccation   Outcome: patient tolerated procedure well   Post-procedure details: wound care instructions given   Post-procedure details comment:  Ointment and small bandage applied Specimen 1 - Surgical pathology Differential Diagnosis: Irritated Nevus vs other Check Margins: No Return in about 6 months (around 08/12/2023) for AKs.  ICherlyn Labella, CMA, am acting as scribe for Willeen Niece, MD .   Documentation: I have reviewed the above documentation for accuracy and completeness, and I agree with the above.  Willeen Niece, MD

## 2023-02-11 NOTE — Patient Instructions (Addendum)
Start tacrolimus ointment - apply to hands and feet once to twice daily as needed for rash.  Continue clobetasol ointment to more severe areas hands and feet once to twice daily. Avoid applying to face, groin, and axilla. Use as directed. Long-term use can cause thinning of the skin.   Cryotherapy Aftercare  Wash gently with soap and water everyday.   Apply Vaseline and Band-Aid daily until healed.   Wound Care Instructions  Cleanse wound gently with soap and water once a day then pat dry with clean gauze. Apply a thin coat of Petrolatum (petroleum jelly, "Vaseline") over the wound (unless you have an allergy to this). We recommend that you use a new, sterile tube of Vaseline. Do not pick or remove scabs. Do not remove the yellow or white "healing tissue" from the base of the wound.  Cover the wound with fresh, clean, nonstick gauze and secure with paper tape. You may use Band-Aids in place of gauze and tape if the wound is small enough, but would recommend trimming much of the tape off as there is often too much. Sometimes Band-Aids can irritate the skin.  You should call the office for your biopsy report after 1 week if you have not already been contacted.  If you experience any problems, such as abnormal amounts of bleeding, swelling, significant bruising, significant pain, or evidence of infection, please call the office immediately.  FOR ADULT SURGERY PATIENTS: If you need something for pain relief you may take 1 extra strength Tylenol (acetaminophen) AND 2 Ibuprofen (200mg  each) together every 4 hours as needed for pain. (do not take these if you are allergic to them or if you have a reason you should not take them.) Typically, you may only need pain medication for 1 to 3 days.    Gentle Skin Care Guide  1. Bathe no more than once a day.  2. Avoid bathing in hot water  3. Use a mild soap like Dove, Vanicream, Cetaphil, CeraVe. Can use Lever 2000 or Cetaphil antibacterial soap  4.  Use soap only where you need it. On most days, use it under your arms, between your legs, and on your feet. Let the water rinse other areas unless visibly dirty.  5. When you get out of the bath/shower, use a towel to gently blot your skin dry, don't rub it.  6. While your skin is still a little damp, apply a moisturizing cream such as Vanicream, CeraVe, Cetaphil, Eucerin, Sarna lotion or plain Vaseline Jelly. For hands apply Neutrogena [place] Hand Cream or Excipial Hand Cream.  7. Reapply moisturizer any time you start to itch or feel dry.  8. Sometimes using free and clear laundry detergents can be helpful. Fabric softener sheets should be avoided. Downy Free & Gentle liquid, or any liquid fabric softener that is free of dyes and perfumes, it acceptable to use  9. If your doctor has given you prescription creams you may apply moisturizers over them     Due to recent changes in healthcare laws, you may see results of your pathology and/or laboratory studies on MyChart before the doctors have had a chance to review them. We understand that in some cases there may be results that are confusing or concerning to you. Please understand that not all results are received at the same time and often the doctors may need to interpret multiple results in order to provide you with the best plan of care or course of treatment. Therefore, we ask that  you please give [place] 2 business days to thoroughly review all your results before contacting the office for clarification. Should we see a critical lab result, you will be contacted sooner.   If You Need Anything After Your Visit  If you have any questions or concerns for your doctor, please call our main line at [phone] and press option 4 to reach your doctor's medical assistant. If no one answers, please leave a voicemail as directed and we will return your call as soon as possible. Messages left after 4 pm will be answered the following business day.    You may also send [place] a message via MyChart. We typically respond to MyChart messages within 1-2 business days.  For prescription refills, please ask your pharmacy to contact our office. Our fax number is [phone].  If you have an urgent issue when the clinic is closed that cannot wait until the next business day, you can page your doctor at the number below.    Please note that while we do our best to be available for urgent issues outside of office hours, we are not available 24/7.   If you have an urgent issue and are unable to reach [place], you may choose to seek medical care at your doctor's office, retail clinic, urgent care center, or emergency room.  If you have a medical emergency, please immediately call 911 or go to the emergency department.  Pager Numbers  - Dr. Gwen Pounds: [phone]  - Dr. Roseanne Reno: [phone]  - Dr. Katrinka Blazing: [phone]   In the event of inclement weather, please call our main line at [phone] for an update on the status of any delays or closures.  Dermatology Medication Tips: Please keep the boxes that topical medications come in in order to help keep track of the instructions about where and how to use these. Pharmacies typically print the medication instructions only on the boxes and not directly on the medication tubes.   If your medication is too expensive, please contact our office at [phone] option 4 or send [place] a message through MyChart.   We are unable to tell what your co-pay for medications will be in advance as this is different depending on your insurance coverage. However, we may be able to find a substitute medication at lower cost or fill out paperwork to get insurance to cover a needed medication.   If a prior authorization is required to get your medication covered by your insurance company, please allow [place] 1-2 business days to complete this process.  Drug prices often vary depending on where the prescription is filled and  some pharmacies may offer cheaper prices.  The website www.goodrx.com contains coupons for medications through different pharmacies. The prices here do not account for what the cost may be with help from insurance (it may be cheaper with your insurance), but the website can give you the price if you did not use any insurance.  - You can print the associated coupon and take it with your prescription to the pharmacy.  - You may also stop by our office during regular business hours and pick up a GoodRx coupon card.  - If you need your prescription sent electronically to a different pharmacy, notify our office through [facility] or by phone at [phone] option 4.     Si Usted Necesita Algo Despus de Su Visita  Tambin puede enviarnos un mensaje a travs de Clinical cytogeneticist. Por lo general respondemos a los Public relations account executive  transcurso de 1 a 2 das hbiles.  Para renovar recetas, por favor pida a su farmacia que se ponga en contacto con nuestra oficina. Annie Sable de fax es [place] [phone].  Si tiene un asunto urgente cuando la clnica est cerrada y que no puede esperar hasta el siguiente da hbil, puede llamar/localizar a su doctor(a) al nmero que aparece a continuacin.   Por favor, tenga en cuenta que aunque hacemos todo lo posible para estar disponibles para asuntos urgentes fuera del horario de [place], no estamos disponibles las 24 horas del da, los 7 [address]  [address] de la [place].   Si tiene un problema urgente y no puede comunicarse con nosotros, puede optar por buscar atencin mdica  en el consultorio de su doctor(a), en una clnica privada, en un centro de atencin urgente o en una sala de emergencias.  Si tiene Engineer, drilling, por favor llame inmediatamente al 911 o vaya a la sala de emergencias.  Nmeros de bper  - Dr. Gwen Pounds: [phone]  - Dra. Roseanne Reno: [phone]  - Dr. Katrinka Blazing: [phone]   En caso de inclemencias del tiempo, por favor llame a Lacy Duverney principal al [phone] para una actualizacin sobre el [place] de cualquier retraso o cierre.  Consejos para la medicacin en dermatologa: Por favor, guarde las cajas en las que vienen los medicamentos de uso tpico para ayudarle a seguir las instrucciones sobre dnde y cmo usarlos. Las farmacias generalmente imprimen las instrucciones del medicamento slo en las cajas y no directamente en los tubos del [place].   Si su medicamento es muy caro, por favor, pngase en contacto con Rolm Gala llamando al [phone] y presione la opcin 4 o envenos un mensaje a travs de Clinical cytogeneticist.   No podemos decirle cul ser su copago por los medicamentos por adelantado ya que esto es diferente dependiendo de la cobertura de su seguro. Sin embargo, es posible que podamos encontrar un medicamento sustituto a Audiological scientist un formulario para que el seguro cubra el medicamento que se considera necesario.   Si se requiere una autorizacin previa para que su compaa de seguros [place] su medicamento, por favor permtanos de 1 a 2 das hbiles para completar [address]  Los precios de los medicamentos varan con frecuencia dependiendo del Environmental consultant de dnde se surte la receta y alguna farmacias pueden ofrecer precios ms baratos.  El sitio web www.goodrx.com tiene cupones para medicamentos de Health and safety inspector. Los precios aqu no tienen en cuenta lo que podra costar con la ayuda del seguro (puede ser ms barato con su seguro), pero el sitio web puede darle el precio si no utiliz Tourist information centre manager.  - Puede imprimir el cupn correspondiente y llevarlo con su receta a la farmacia.  - Tambin puede pasar por nuestra oficina durante el horario de atencin regular y Education officer, museum una tarjeta de cupones de GoodRx.  - Si necesita que su receta se enve electrnicamente a una farmacia diferente, informe a nuestra oficina a travs de MyChart de [place] o por telfono llamando al [phone] y presione la  opcin 4.

## 2023-02-13 NOTE — Telephone Encounter (Signed)
PA submitted.

## 2023-02-14 LAB — SURGICAL PATHOLOGY

## 2023-02-17 ENCOUNTER — Telehealth: Payer: Self-pay

## 2023-02-17 NOTE — Telephone Encounter (Signed)
-----   Message from Willeen Niece sent at 02/17/2023 12:47 PM EST ----- 1. Skin, right lateral upper back :       MELANOCYTIC NEVUS, INTRADERMAL TYPE   Benign nevus - please call patient

## 2023-02-17 NOTE — Telephone Encounter (Signed)
Pt's daughter informed of pathology results.

## 2023-02-17 NOTE — Telephone Encounter (Signed)
Left pt msg to call for bx results/sh 

## 2023-02-20 ENCOUNTER — Ambulatory Visit
Admission: RE | Admit: 2023-02-20 | Discharge: 2023-02-20 | Disposition: A | Discharge status: Home / Self Care | Payer: 59 | Source: Ambulatory Visit | Attending: Student | Admitting: Student

## 2023-02-20 DIAGNOSIS — M5416 Radiculopathy, lumbar region: Secondary | ICD-10-CM

## 2023-02-20 DIAGNOSIS — M47816 Spondylosis without myelopathy or radiculopathy, lumbar region: Secondary | ICD-10-CM

## 2023-02-20 DIAGNOSIS — M48061 Spinal stenosis, lumbar region without neurogenic claudication: Secondary | ICD-10-CM

## 2023-02-20 DIAGNOSIS — G8929 Other chronic pain: Secondary | ICD-10-CM

## 2023-02-24 ENCOUNTER — Ambulatory Visit (INDEPENDENT_AMBULATORY_CARE_PROVIDER_SITE_OTHER): Payer: 59 | Admitting: Physician Assistant

## 2023-02-24 DIAGNOSIS — N3946 Mixed incontinence: Secondary | ICD-10-CM | POA: Diagnosis not present

## 2023-02-24 NOTE — Progress Notes (Signed)
PTNS  Session # 30  Health & Social Factors: no change Caffeine: none Alcohol: none Daytime voids #per day: 12 Night-time voids #per night: 5 Urgency: strong Incontinence Episodes #per day: 3-4 Ankle used: right Treatment Setting: 6 Feeling/ Response: toe flex and sensory Comments: n/a  Performed By: Alvina Chou  Follow Up: 1 month

## 2023-03-25 ENCOUNTER — Ambulatory Visit (INDEPENDENT_AMBULATORY_CARE_PROVIDER_SITE_OTHER): Payer: 59 | Admitting: Physician Assistant

## 2023-03-25 DIAGNOSIS — N3946 Mixed incontinence: Secondary | ICD-10-CM | POA: Diagnosis not present

## 2023-03-25 DIAGNOSIS — N3941 Urge incontinence: Secondary | ICD-10-CM

## 2023-03-25 MED ORDER — GEMTESA 75 MG PO TABS: 75.0000 mg | ORAL_TABLET | Freq: Every day | ORAL | 3 refills | Status: AC

## 2023-03-25 NOTE — Addendum Note (Signed)
Addended by: Judd Gaudier on: 03/25/2023 04:17 PM   Modules accepted: Orders

## 2023-03-25 NOTE — Progress Notes (Signed)
PTNS  Session # 31  Health & Social Factors: Fewer incontinence episodes, increased frequency this month. Caffeine: 0 Alcohol: 0 Daytime voids #per day: hourly Night-time voids #per night: 4-5 Urgency: strong Incontinence Episodes #per day: 3 Ankle used: right Treatment Setting: 7 Feeling/ Response: both Comments: Patient tolerated well.  Performed By: Carman Ching, PA-C   Follow Up: 1 month

## 2023-04-21 ENCOUNTER — Ambulatory Visit: Payer: 59 | Admitting: Physician Assistant

## 2023-04-21 DIAGNOSIS — N3946 Mixed incontinence: Secondary | ICD-10-CM

## 2023-04-21 NOTE — Progress Notes (Signed)
 PTNS   Session # 32   Health & Social Factors: Fewer incontinence episodes, increased frequency this month. Caffeine: 0 Alcohol: 0 Daytime voids #per day: hourly Night-time voids #per night: 4-5 Urgency: strong Incontinence Episodes #per day: 3 Ankle used: right 6 Treatment Setting: Feeling/ Response: both Comments: Patient tolerated well.   Performed By: Ples Specter Cma   Follow Up: 1 month

## 2023-04-22 ENCOUNTER — Other Ambulatory Visit
Admission: RE | Admit: 2023-04-22 | Discharge: 2023-04-22 | Disposition: A | Discharge status: Home / Self Care | Payer: 59 | Source: Ambulatory Visit | Attending: Specialist | Admitting: Specialist

## 2023-04-22 ENCOUNTER — Ambulatory Visit: Payer: 59 | Admitting: Physician Assistant

## 2023-04-22 DIAGNOSIS — R062 Wheezing: Secondary | ICD-10-CM | POA: Diagnosis present

## 2023-04-22 LAB — BRAIN NATRIURETIC PEPTIDE: B Natriuretic Peptide: 88.9 pg/mL (ref 0.0–100.0)

## 2023-04-28 NOTE — Progress Notes (Signed)
 PTNS   Session # 32   Health & Social Factors: Fewer incontinence episodes, increased frequency this month. Caffeine: 0 Alcohol: 0 Daytime voids #per day: hourly Night-time voids #per night: 4-5 Urgency: strong Incontinence Episodes #per day: 3 Ankle used: right 6 Treatment Setting: Feeling/ Response: both Comments: Patient tolerated well.   Performed By: Ples Specter Cma   Follow Up: 1 month

## 2023-05-05 ENCOUNTER — Other Ambulatory Visit: Payer: Self-pay | Admitting: Internal Medicine

## 2023-05-05 DIAGNOSIS — I2510 Atherosclerotic heart disease of native coronary artery without angina pectoris: Secondary | ICD-10-CM

## 2023-05-05 DIAGNOSIS — I480 Paroxysmal atrial fibrillation: Secondary | ICD-10-CM

## 2023-05-12 ENCOUNTER — Encounter
Admission: RE | Admit: 2023-05-12 | Discharge: 2023-05-12 | Disposition: A | Discharge status: Home / Self Care | Source: Ambulatory Visit | Attending: Internal Medicine | Admitting: Internal Medicine

## 2023-05-12 DIAGNOSIS — I2510 Atherosclerotic heart disease of native coronary artery without angina pectoris: Secondary | ICD-10-CM | POA: Insufficient documentation

## 2023-05-12 DIAGNOSIS — I480 Paroxysmal atrial fibrillation: Secondary | ICD-10-CM | POA: Insufficient documentation

## 2023-05-12 MED ORDER — REGADENOSON 0.4 MG/5ML IV SOLN
0.4000 mg | Freq: Once | INTRAVENOUS | Status: AC
  Administered 2023-05-12: 0.4 mg via INTRAVENOUS

## 2023-05-12 MED ORDER — TECHNETIUM TC 99M TETROFOSMIN IV KIT
10.7900 | PACK | Freq: Once | INTRAVENOUS | Status: AC | PRN
  Administered 2023-05-12: 10.79 via INTRAVENOUS

## 2023-05-12 MED ORDER — TECHNETIUM TC 99M TETROFOSMIN IV KIT
28.4500 | PACK | Freq: Once | INTRAVENOUS | Status: AC | PRN
  Administered 2023-05-12: 28.45 via INTRAVENOUS

## 2023-05-15 LAB — NM MYOCAR MULTI W/SPECT W/WALL MOTION / EF
Estimated workload: 1
Exercise duration (min): 1 min
Exercise duration (sec): 0 s
LV dias vol: 47 mL (ref 46–106)
LV sys vol: 15 mL
MPHR: 132 {beats}/min
Nuc Stress EF: 68 %
Peak HR: 86 {beats}/min
Percent HR: 65 %
Rest HR: 62 {beats}/min
Rest Nuclear Isotope Dose: 10.8 mCi
SDS: 4
SRS: 4
SSS: 7
ST Depression (mm): 0 mm
Stress Nuclear Isotope Dose: 28.5 mCi
TID: 1

## 2023-05-19 ENCOUNTER — Ambulatory Visit (INDEPENDENT_AMBULATORY_CARE_PROVIDER_SITE_OTHER): Payer: 59 | Admitting: Physician Assistant

## 2023-05-19 ENCOUNTER — Ambulatory Visit: Payer: 59 | Admitting: Physician Assistant

## 2023-05-19 DIAGNOSIS — N3946 Mixed incontinence: Secondary | ICD-10-CM | POA: Diagnosis not present

## 2023-05-19 NOTE — Progress Notes (Signed)
 PTNS  Session # 33  Health & Social Factors: no change Caffeine: 0 Alcohol: 0 Daytime voids #per day: 8-10 Night-time voids #per night: 4-5 Urgency: strong Incontinence Episodes #per day: 0 Ankle used: right Treatment Setting: 13 Feeling/ Response: sensory Comments: Patient tolerated well.  Performed By: Carman Ching, PA-C   Follow Up: 1 month

## 2023-06-05 ENCOUNTER — Ambulatory Visit
Admission: RE | Admit: 2023-06-05 | Discharge: 2023-06-05 | Disposition: A | Discharge status: Home / Self Care | Source: Ambulatory Visit | Attending: Internal Medicine | Admitting: Internal Medicine

## 2023-06-05 ENCOUNTER — Other Ambulatory Visit: Payer: Self-pay | Admitting: Internal Medicine

## 2023-06-05 DIAGNOSIS — R109 Unspecified abdominal pain: Secondary | ICD-10-CM | POA: Insufficient documentation

## 2023-06-05 DIAGNOSIS — R140 Abdominal distension (gaseous): Secondary | ICD-10-CM | POA: Diagnosis present

## 2023-06-05 DIAGNOSIS — Z8572 Personal history of non-Hodgkin lymphomas: Secondary | ICD-10-CM | POA: Insufficient documentation

## 2023-06-05 DIAGNOSIS — R1031 Right lower quadrant pain: Secondary | ICD-10-CM

## 2023-06-05 MED ORDER — IOHEXOL 300 MG/ML  SOLN
80.0000 mL | Freq: Once | INTRAMUSCULAR | Status: AC | PRN
  Administered 2023-06-05: 80 mL via INTRAVENOUS

## 2023-06-06 ENCOUNTER — Encounter: Payer: Self-pay | Admitting: Internal Medicine

## 2023-06-06 DIAGNOSIS — R1031 Right lower quadrant pain: Secondary | ICD-10-CM

## 2023-06-06 DIAGNOSIS — Z8572 Personal history of non-Hodgkin lymphomas: Secondary | ICD-10-CM

## 2023-06-06 DIAGNOSIS — R140 Abdominal distension (gaseous): Secondary | ICD-10-CM

## 2023-06-06 DIAGNOSIS — R109 Unspecified abdominal pain: Secondary | ICD-10-CM

## 2023-06-09 ENCOUNTER — Other Ambulatory Visit: Payer: Self-pay | Admitting: Internal Medicine

## 2023-06-09 DIAGNOSIS — R935 Abnormal findings on diagnostic imaging of other abdominal regions, including retroperitoneum: Secondary | ICD-10-CM

## 2023-06-09 DIAGNOSIS — S22060A Wedge compression fracture of T7-T8 vertebra, initial encounter for closed fracture: Secondary | ICD-10-CM

## 2023-06-13 ENCOUNTER — Encounter: Payer: Self-pay | Admitting: Internal Medicine

## 2023-06-13 ENCOUNTER — Ambulatory Visit
Admission: RE | Admit: 2023-06-13 | Discharge: 2023-06-13 | Disposition: A | Discharge status: Home / Self Care | Attending: Internal Medicine | Admitting: Internal Medicine

## 2023-06-13 ENCOUNTER — Encounter
Admission: RE | Disposition: A | Discharge status: Home / Self Care | Payer: Self-pay | Source: Home / Self Care | Attending: Internal Medicine

## 2023-06-13 ENCOUNTER — Other Ambulatory Visit: Payer: Self-pay

## 2023-06-13 DIAGNOSIS — I350 Nonrheumatic aortic (valve) stenosis: Secondary | ICD-10-CM | POA: Insufficient documentation

## 2023-06-13 DIAGNOSIS — I200 Unstable angina: Secondary | ICD-10-CM | POA: Diagnosis present

## 2023-06-13 DIAGNOSIS — I25110 Atherosclerotic heart disease of native coronary artery with unstable angina pectoris: Secondary | ICD-10-CM | POA: Diagnosis not present

## 2023-06-13 DIAGNOSIS — Z955 Presence of coronary angioplasty implant and graft: Secondary | ICD-10-CM | POA: Diagnosis not present

## 2023-06-13 HISTORY — PX: RIGHT/LEFT HEART CATH AND CORONARY ANGIOGRAPHY: CATH118266

## 2023-06-13 LAB — POCT I-STAT 7, (LYTES, BLD GAS, ICA,H+H)
Acid-Base Excess: 3 mmol/L — ABNORMAL HIGH (ref 0.0–2.0)
Bicarbonate: 29.1 mmol/L — ABNORMAL HIGH (ref 20.0–28.0)
Calcium, Ion: 1.16 mmol/L (ref 1.15–1.40)
HCT: 35 % — ABNORMAL LOW (ref 36.0–46.0)
Hemoglobin: 11.9 g/dL — ABNORMAL LOW (ref 12.0–15.0)
O2 Saturation: 89 %
Potassium: 3.8 mmol/L (ref 3.5–5.1)
Sodium: 137 mmol/L (ref 135–145)
TCO2: 31 mmol/L (ref 22–32)
pCO2 arterial: 49 mmHg — ABNORMAL HIGH (ref 32–48)
pH, Arterial: 7.382 (ref 7.35–7.45)
pO2, Arterial: 58 mmHg — ABNORMAL LOW (ref 83–108)

## 2023-06-13 LAB — POCT I-STAT EG7
Acid-Base Excess: 4 mmol/L — ABNORMAL HIGH (ref 0.0–2.0)
Bicarbonate: 30.5 mmol/L — ABNORMAL HIGH (ref 20.0–28.0)
Calcium, Ion: 1.17 mmol/L (ref 1.15–1.40)
HCT: 36 % (ref 36.0–46.0)
Hemoglobin: 12.2 g/dL (ref 12.0–15.0)
O2 Saturation: 60 %
Potassium: 3.8 mmol/L (ref 3.5–5.1)
Sodium: 137 mmol/L (ref 135–145)
TCO2: 32 mmol/L (ref 22–32)
pCO2, Ven: 54.1 mmHg (ref 44–60)
pH, Ven: 7.359 (ref 7.25–7.43)
pO2, Ven: 33 mmHg (ref 32–45)

## 2023-06-13 SURGERY — RIGHT/LEFT HEART CATH AND CORONARY ANGIOGRAPHY
Anesthesia: Moderate Sedation | Laterality: Bilateral

## 2023-06-13 MED ORDER — LIDOCAINE HCL 1 % IJ SOLN
INTRAMUSCULAR | Status: AC
  Filled 2023-06-13: qty 20

## 2023-06-13 MED ORDER — VERAPAMIL HCL 2.5 MG/ML IV SOLN
INTRAVENOUS | Status: AC
  Filled 2023-06-13: qty 2

## 2023-06-13 MED ORDER — SODIUM CHLORIDE 0.9 % WEIGHT BASED INFUSION: 1.0000 mL/kg/h | INTRAVENOUS | Status: DC

## 2023-06-13 MED ORDER — HEPARIN (PORCINE) IN NACL 2000-0.9 UNIT/L-% IV SOLN
INTRAVENOUS | Status: DC | PRN
  Administered 2023-06-13: 1000 mL

## 2023-06-13 MED ORDER — IOHEXOL 300 MG/ML  SOLN
INTRAMUSCULAR | Status: DC | PRN
  Administered 2023-06-13: 70 mL

## 2023-06-13 MED ORDER — SODIUM CHLORIDE 0.9 % IV SOLN: 250.0000 mL | INTRAVENOUS | Status: DC | PRN

## 2023-06-13 MED ORDER — LIDOCAINE HCL (PF) 1 % IJ SOLN
INTRAMUSCULAR | Status: DC | PRN
  Administered 2023-06-13 (×2): 2 mL

## 2023-06-13 MED ORDER — FENTANYL CITRATE (PF) 100 MCG/2ML IJ SOLN
INTRAMUSCULAR | Status: DC | PRN
  Administered 2023-06-13: 25 ug via INTRAVENOUS

## 2023-06-13 MED ORDER — HEPARIN (PORCINE) IN NACL 1000-0.9 UT/500ML-% IV SOLN
INTRAVENOUS | Status: AC
  Filled 2023-06-13: qty 1000

## 2023-06-13 MED ORDER — SODIUM CHLORIDE 0.9 % IV BOLUS: 250.0000 mL | Freq: Once | INTRAVENOUS | Status: AC

## 2023-06-13 MED ORDER — SODIUM CHLORIDE 0.9 % WEIGHT BASED INFUSION
10.0000 mL/h | INTRAVENOUS | Status: DC
  Administered 2023-06-13: 10 mL/h via INTRAVENOUS

## 2023-06-13 MED ORDER — HEPARIN SODIUM (PORCINE) 1000 UNIT/ML IJ SOLN
INTRAMUSCULAR | Status: AC
  Filled 2023-06-13: qty 10

## 2023-06-13 MED ORDER — SODIUM CHLORIDE 0.9% FLUSH: 3.0000 mL | INTRAVENOUS | Status: DC | PRN

## 2023-06-13 MED ORDER — HEPARIN SODIUM (PORCINE) 1000 UNIT/ML IJ SOLN
INTRAMUSCULAR | Status: DC | PRN
  Administered 2023-06-13: 3000 [IU] via INTRAVENOUS

## 2023-06-13 MED ORDER — FENTANYL CITRATE (PF) 100 MCG/2ML IJ SOLN
INTRAMUSCULAR | Status: AC
  Filled 2023-06-13: qty 2

## 2023-06-13 MED ORDER — VERAPAMIL HCL 2.5 MG/ML IV SOLN
INTRAVENOUS | Status: DC | PRN
  Administered 2023-06-13: 2.5 mg via INTRA_ARTERIAL

## 2023-06-13 MED ORDER — ASPIRIN 81 MG PO CHEW
81.0000 mg | CHEWABLE_TABLET | ORAL | Status: AC
  Administered 2023-06-13: 81 mg via ORAL
  Filled 2023-06-13: qty 1

## 2023-06-13 MED ORDER — MIDAZOLAM HCL 2 MG/2ML IJ SOLN
INTRAMUSCULAR | Status: DC | PRN
  Administered 2023-06-13: .5 mg via INTRAVENOUS

## 2023-06-13 MED ORDER — SODIUM CHLORIDE 0.9 % WEIGHT BASED INFUSION: 210.0000 mL/h | INTRAVENOUS | Status: DC

## 2023-06-13 MED ORDER — MIDAZOLAM HCL 2 MG/2ML IJ SOLN
INTRAMUSCULAR | Status: AC
  Filled 2023-06-13: qty 2

## 2023-06-13 MED ORDER — SODIUM CHLORIDE 0.9% FLUSH: 3.0000 mL | Freq: Two times a day (BID) | INTRAVENOUS | Status: DC

## 2023-06-13 SURGICAL SUPPLY — 13 items
CATH 5FR JL3.5 JR4 ANG PIG MP (CATHETERS) IMPLANT
CATH BALLN WEDGE 5F 110CM (CATHETERS) IMPLANT
DEVICE RAD TR BAND REGULAR (VASCULAR PRODUCTS) IMPLANT
DRAPE BRACHIAL (DRAPES) IMPLANT
GLIDESHEATH SLEND SS 6F .021 (SHEATH) IMPLANT
GUIDEWIRE INQWIRE 1.5J.035X260 (WIRE) IMPLANT
INQWIRE 1.5J .035X260CM (WIRE) ×1 IMPLANT
PACK CARDIAC CATH (CUSTOM PROCEDURE TRAY) ×1 IMPLANT
PROTECTION STATION PRESSURIZED (MISCELLANEOUS) ×1 IMPLANT
SET ATX-X65L (MISCELLANEOUS) IMPLANT
SHEATH GLIDE SLENDER 4/5FR (SHEATH) IMPLANT
STATION PROTECTION PRESSURIZED (MISCELLANEOUS) IMPLANT
WIRE HITORQ VERSACORE ST 145CM (WIRE) IMPLANT

## 2023-06-13 NOTE — CV Procedure (Signed)
 Brief cath note  Outpatient right and left heart cath because of persistent dyspnea anginal equivalent high coronary calcium score  Right and left heart cath right brachial vein right radial artery  Right heart cath Mean wedge 8 Mean PA 22 PA sat 60 Arterial sat 89 both on room air Cardiac put 4.7  Coronaries Left main large minor irregularities LAD large mild to moderate disease diagonal 1 with the 90% proximal lesion moderate-sized artery Circumflex large mild disease RCA minor irregularities  Intervention deferred Recommend aggressive medical therapy Consider cardiac MRI for evaluation of infiltrative process  Have the patient follow-up with cardiology 1 to 2 weeks  Full cath note to follow  [facility]

## 2023-06-13 NOTE — Discharge Instructions (Signed)
 Radial Site Care Refer to this sheet in the next few weeks. These instructions provide you with information about caring for yourself after your procedure. Your health care provider may also give you more specific instructions. Your treatment has been pla

## 2023-06-14 LAB — CARDIAC CATHETERIZATION: Cath EF Quantitative: 55 %

## 2023-06-16 ENCOUNTER — Ambulatory Visit
Admission: RE | Admit: 2023-06-16 | Discharge: 2023-06-16 | Disposition: A | Discharge status: Home / Self Care | Source: Ambulatory Visit | Attending: Internal Medicine | Admitting: Internal Medicine

## 2023-06-16 ENCOUNTER — Encounter: Payer: Self-pay | Admitting: Internal Medicine

## 2023-06-16 DIAGNOSIS — R935 Abnormal findings on diagnostic imaging of other abdominal regions, including retroperitoneum: Secondary | ICD-10-CM | POA: Insufficient documentation

## 2023-06-16 DIAGNOSIS — S22060A Wedge compression fracture of T7-T8 vertebra, initial encounter for closed fracture: Secondary | ICD-10-CM | POA: Diagnosis present

## 2023-06-16 MED ORDER — GADOBUTROL 1 MMOL/ML IV SOLN
7.0000 mL | Freq: Once | INTRAVENOUS | Status: AC | PRN
  Administered 2023-06-16: 7 mL via INTRAVENOUS

## 2023-06-19 ENCOUNTER — Ambulatory Visit: Admitting: Physician Assistant

## 2023-06-24 ENCOUNTER — Ambulatory Visit (INDEPENDENT_AMBULATORY_CARE_PROVIDER_SITE_OTHER): Admitting: Physician Assistant

## 2023-06-24 DIAGNOSIS — N3946 Mixed incontinence: Secondary | ICD-10-CM

## 2023-06-24 NOTE — Progress Notes (Signed)
 PTNS  Session # 34  Health & Social Factors: no change, thinks her urinary symptoms are improving Caffeine: 0 Alcohol: 0 Daytime voids #per day: 5 Night-time voids #per night: 6 Urgency: mild Incontinence Episodes #per day: lots, changing pads 5-6+ times daily Ankle used: right Treatment Setting: 6 Feeling/ Response: both Comments: Patient tolerated well.  Performed By: Azyiah Bo, PA-C   Follow Up: Return in about 4 weeks (around 07/22/2023) for PTNS maintenance.

## 2023-06-24 NOTE — Progress Notes (Deleted)
 k

## 2023-07-03 ENCOUNTER — Telehealth: Payer: Self-pay

## 2023-07-03 NOTE — Telephone Encounter (Signed)
 Spoke with Andree Kayser (CMA for Dr Mozell Arias) to update her on the status of the referral.

## 2023-07-03 NOTE — Telephone Encounter (Signed)
 Received urgent referral from Dr Mozell Arias for spinal tumor. Dr Mont Antis discussed with Dr Mozell Arias and advised that the patient should present to the ER for biopsy and coordination of care with oncology. Dr Mozell Arias personally spoke with the daughter about this. The daughter advised Dr Mozell Arias that they prefer not to do this and would like to wait until next week. I informed Burdette Carolin (CMA with Dr Mozell Arias) that Dr Mont Antis advises that if she has worsening weakness/numbness/walking problems, she needs to go to the ER and that while we can see her outpatient next week, it would significantly delay her care but if she goes to the ER, she can be admitted for a biopsy. Burdette Carolin stated that she shared this with Dr Mozell Arias.   The daughter then called our office requesting an appointment for next week. Per secure chat from Margarita Shear to Dr Mont Antis and I: "The daughter wants to know if she takes her to the ER what is she supposed to tell them to where she makes sure that everything gets done that needs to?"  Per Dr Mont Antis, Jerrold Morgan informed the daughter that Dr Mont Antis said: "that it appears that she has cancer in her spine, and needs biopsy confirmation to initiate treatment IF that is what the patient would want to do. this is time sensitive - she already had some compression on her MRI from a couple of weeks ago. If we do this as outpatient it is going to be 10-14 days probably before she gets a biopsy. if she goes to ER, she should tell them that I recommended she go to ER because her MRI shows she has tumor invading her spine. Today would be ideal, or tomorrow AM AND tell not to eat after midnight so that they might be able to get the biopsy done".  Per Jerrold Morgan: "Patient's daughter notified and took detailed notes of the recommendations. She states that she will plan to take her mother tomorrow as she has a lengthy appointment at [facility] this afternoon. I advised her to contact our office once she is discharged."

## 2023-07-04 ENCOUNTER — Emergency Department

## 2023-07-04 ENCOUNTER — Observation Stay
Admission: EM | Admit: 2023-07-04 | Discharge: 2023-07-04 | Disposition: A | Discharge status: Home / Self Care | Attending: Internal Medicine | Admitting: Internal Medicine

## 2023-07-04 ENCOUNTER — Other Ambulatory Visit: Payer: Self-pay | Admitting: Internal Medicine

## 2023-07-04 ENCOUNTER — Inpatient Hospital Stay

## 2023-07-04 ENCOUNTER — Other Ambulatory Visit: Payer: Self-pay

## 2023-07-04 DIAGNOSIS — G9589 Other specified diseases of spinal cord: Secondary | ICD-10-CM | POA: Diagnosis not present

## 2023-07-04 DIAGNOSIS — I2510 Atherosclerotic heart disease of native coronary artery without angina pectoris: Secondary | ICD-10-CM | POA: Insufficient documentation

## 2023-07-04 DIAGNOSIS — G96198 Other disorders of meninges, not elsewhere classified: Secondary | ICD-10-CM

## 2023-07-04 DIAGNOSIS — I5032 Chronic diastolic (congestive) heart failure: Secondary | ICD-10-CM

## 2023-07-04 DIAGNOSIS — N1832 Chronic kidney disease, stage 3b: Secondary | ICD-10-CM

## 2023-07-04 DIAGNOSIS — D497 Neoplasm of unspecified behavior of endocrine glands and other parts of nervous system: Principal | ICD-10-CM

## 2023-07-04 DIAGNOSIS — N183 Chronic kidney disease, stage 3 unspecified: Secondary | ICD-10-CM | POA: Insufficient documentation

## 2023-07-04 DIAGNOSIS — I130 Hypertensive heart and chronic kidney disease with heart failure and stage 1 through stage 4 chronic kidney disease, or unspecified chronic kidney disease: Secondary | ICD-10-CM | POA: Insufficient documentation

## 2023-07-04 DIAGNOSIS — D492 Neoplasm of unspecified behavior of bone, soft tissue, and skin: Secondary | ICD-10-CM

## 2023-07-04 DIAGNOSIS — E785 Hyperlipidemia, unspecified: Secondary | ICD-10-CM | POA: Diagnosis not present

## 2023-07-04 DIAGNOSIS — J410 Simple chronic bronchitis: Secondary | ICD-10-CM

## 2023-07-04 DIAGNOSIS — J449 Chronic obstructive pulmonary disease, unspecified: Secondary | ICD-10-CM | POA: Diagnosis present

## 2023-07-04 DIAGNOSIS — C8590 Non-Hodgkin lymphoma, unspecified, unspecified site: Secondary | ICD-10-CM | POA: Insufficient documentation

## 2023-07-04 DIAGNOSIS — I509 Heart failure, unspecified: Secondary | ICD-10-CM | POA: Diagnosis not present

## 2023-07-04 LAB — CBC
HCT: 38.1 % (ref 36.0–46.0)
Hemoglobin: 12.6 g/dL (ref 12.0–15.0)
MCH: 31.4 pg (ref 26.0–34.0)
MCHC: 33.1 g/dL (ref 30.0–36.0)
MCV: 95 fL (ref 80.0–100.0)
Platelets: 226 10*3/uL (ref 150–400)
RBC: 4.01 MIL/uL (ref 3.87–5.11)
RDW: 15.1 % (ref 11.5–15.5)
WBC: 7.2 10*3/uL (ref 4.0–10.5)
nRBC: 0 % (ref 0.0–0.2)

## 2023-07-04 LAB — BASIC METABOLIC PANEL WITH GFR
Anion gap: UNDETERMINED (ref 5–15)
Anion gap: 10 (ref 5–15)
BUN: UNDETERMINED mg/dL (ref 8–23)
BUN: 33 mg/dL — ABNORMAL HIGH (ref 8–23)
CO2: UNDETERMINED mmol/L (ref 22–32)
CO2: 29 mmol/L (ref 22–32)
Calcium: UNDETERMINED mg/dL (ref 8.9–10.3)
Calcium: 9.1 mg/dL (ref 8.9–10.3)
Chloride: UNDETERMINED mmol/L (ref 98–111)
Chloride: 96 mmol/L — ABNORMAL LOW (ref 98–111)
Creatinine, Ser: UNDETERMINED mg/dL (ref 0.44–1.00)
Creatinine, Ser: 1.35 mg/dL — ABNORMAL HIGH (ref 0.44–1.00)
GFR, Estimated: UNDETERMINED mL/min (ref >60)
GFR, Estimated: 38 mL/min — ABNORMAL LOW (ref >60)
Glucose, Bld: UNDETERMINED mg/dL (ref 70–99)
Glucose, Bld: 89 mg/dL (ref 70–99)
Potassium: UNDETERMINED mmol/L (ref 3.5–5.1)
Potassium: 3.7 mmol/L (ref 3.5–5.1)
Sodium: UNDETERMINED mmol/L (ref 135–145)
Sodium: 135 mmol/L (ref 135–145)

## 2023-07-04 LAB — TROPONIN I (HIGH SENSITIVITY)
Troponin I (High Sensitivity): 5 ng/L (ref <18)
Troponin I (High Sensitivity): <2 ng/L (ref <18)

## 2023-07-04 MED ORDER — ONDANSETRON HCL 4 MG/2ML IJ SOLN: 4.0000 mg | Freq: Four times a day (QID) | INTRAMUSCULAR | Status: DC | PRN

## 2023-07-04 MED ORDER — OXYCODONE HCL 5 MG PO TABS: 5.0000 mg | ORAL_TABLET | Freq: Four times a day (QID) | ORAL | Status: DC | PRN

## 2023-07-04 MED ORDER — ACETAMINOPHEN 325 MG RE SUPP: 650.0000 mg | Freq: Four times a day (QID) | RECTAL | Status: DC | PRN

## 2023-07-04 MED ORDER — HYDROMORPHONE HCL 1 MG/ML IJ SOLN: 0.5000 mg | INTRAMUSCULAR | Status: DC | PRN

## 2023-07-04 MED ORDER — SODIUM CHLORIDE 0.9% FLUSH
3.0000 mL | Freq: Two times a day (BID) | INTRAVENOUS | Status: DC
  Administered 2023-07-04: 3 mL via INTRAVENOUS

## 2023-07-04 MED ORDER — ONDANSETRON HCL 4 MG PO TABS: 4.0000 mg | ORAL_TABLET | Freq: Four times a day (QID) | ORAL | Status: DC | PRN

## 2023-07-04 MED ORDER — IOHEXOL 300 MG/ML  SOLN
75.0000 mL | Freq: Once | INTRAMUSCULAR | Status: AC | PRN
  Administered 2023-07-04: 75 mL via INTRAVENOUS

## 2023-07-04 MED ORDER — ACETAMINOPHEN 325 MG PO TABS: 650.0000 mg | ORAL_TABLET | Freq: Four times a day (QID) | ORAL | Status: DC | PRN

## 2023-07-04 NOTE — Assessment & Plan Note (Signed)
 History of non-Hodgkin's lymphoma diagnosed in 2002 s/p chemotherapy with no recurrence.  No cervical lymphadenopathy on examination.

## 2023-07-04 NOTE — Consult Note (Signed)
 Consulting Department:  Emergency department  Primary Physician:  Antonio Baumgarten, MD  Chief Complaint: Spine tumor  History of Present Illness: 07/04/2023 [name] is a 89 y.o. female who presents with the chief complaint of New diagnosis of a spine tumor, she has history of non-Hodgkin's lymphoma, thyroid  carcinoma, end-stage kidney disease.  She has been having rib pain with radiating pain in the right chest and abdomen.  She underwent an MRI for this pain after having a CT scan which demonstrated a T7 fracture.  Patient's family feels like her walking is getting worse but patient herself does deny this.  She has a history of baseline urinary incontinence.  She has some mild back pain but most of it is radicular in nature.  She may have had some worsening ambulation over the past 6 months but it has not been acute.  Review of Systems:  A 10 point review of systems is negative, except for the pertinent positives and negatives detailed in the HPI.  Past Medical History: Past Medical History:  Diagnosis Date   Afib (HCC)    a.) CHA2DS2-VASc = 6 (age x 2, sex, CHF, HTN, aortic plaque). b.) rate/rhythm maintained without pharmacological intervention; she is not on oral anticoagulation   Anxiety    Aortic atherosclerosis (HCC)    Arthritis    Basal cell carcinoma 11/11/2017   Right inferior knee. Superficial and nodular patterns.   Bilateral lower extremity edema    CKD (chronic kidney disease), stage III (HCC)    Congestive heart failure (CHF) (HCC) 05/31/2014   a.) TTE 05/31/2014: EF >55%, RVE; LAE; triv panvalvular regurg; G1DD. b.) TTE 06/29/2017: EF >55%; RVE; BAE; G1DD. c.) TTE 05/09/2018: EF 55-60%; LAE; mod MR, mild AR/TR. d.) TTE 04/29/2020: EF 60-65%; mi;d-mod MR/TR; RVSP 36.6 mmHg. e.) TTE 08/24/2020: EF >55%; mild LVH; mild MR/TR/PR; sev MAC.   COPD (chronic obstructive pulmonary disease) (HCC)    Coronary artery disease 06/03/2008   a.) LHC 06/03/2008: 40% pLAD-1, 60%  pLAD-2, 90% mLAD-1, 30% mLAD-2, 50% mLAD-3 --> PCI placing a 2.0 x 12 mm Mini-Vision BMS x 1 to mLAD-1 lesion. b.) LHC 02/12/10: 50% pLAD, 30% ISR mLAD, 25% dLAD, 25% pLCx, 30% mLCx, 20% pRCA, 20% D1; further intervention deferred opting for medical mgmt.   DDD (degenerative disc disease), lumbar    Depression    GERD (gastroesophageal reflux disease)    HOH (hard of hearing)    a.) uses BILATERAL hearing aids   Hurthle cell neoplasm of thyroid     a.) s/p total thyroidectomy 11/17/2019   Hyperlipemia    Hypertension    Hypothyroidism    Lumbar spinal stenosis    Memory loss    Non Hodgkin's lymphoma (HCC) 2002   Squamous cell carcinoma of skin 06/13/2020   L mid pretibia, biopsy only   Urinary incontinence     Past Surgical History: Past Surgical History:  Procedure Laterality Date   CORONARY ANGIOPLASTY WITH STENT PLACEMENT Left 06/03/2008   Procedure: CORONARY ANGIOPLASTY WITH STENT PLACEMENT; Location: [facility]; Surgeon(s): Thomasene Flemings, MD (diagnostic) and Thais Fill, MD (interventional)   EXCISION OF ABDOMINAL WALL TUMOR Right 05/08/2018   Procedure: EXCISION OF ABDOMINAL WALL-RIGHT;  Surgeon: Eldred Grego, MD;  Location: [facility];  Service: General;  Laterality: Right;   INCISIONAL HERNIA REPAIR N/A 05/08/2018   Procedure: LAPAROSCOPIC VS. OPEN INCISIONAL HERNIA REPAIR WITH MESH;  Surgeon: Eldred Grego, MD;  Location: [facility];  Service: General;  Laterality: N/A;   LEFT HEART CATH AND  CORONARY ANGIOGRAPHY Left 02/12/2010   Procedure: LEFT HEART CATH AND CORONARY ANGIOGRAPHY; Location: [facility]; Surgeon: Thomasene Flemings, MD   LESION REMOVAL Left 08/22/2021   Procedure: LESION REMOVAL (KNEE);  Surgeon: Jackquelyn Mass, MD;  Location: [facility];  Service: Vascular;  Laterality: Left;   RIGHT/LEFT HEART CATH AND CORONARY ANGIOGRAPHY Bilateral 06/13/2023   Procedure: RIGHT/LEFT HEART CATH AND CORONARY ANGIOGRAPHY;  Surgeon: Antonette Batters, MD;  Location: [facility]  INVASIVE CV LAB;  Service: Cardiovascular;  Laterality: Bilateral;   THYROIDECTOMY Bilateral 11/17/2019   Procedure: TOTAL THYROIDECTOMY;  Surgeon: Mellody Sprout, MD;  Location: [facility];  Service: ENT;  Laterality: Bilateral;   TOTAL ABDOMINAL HYSTERECTOMY N/A    VEIN SURGERY     Endovenous ablation of saphenous vein    Allergies: Allergies as of 07/04/2023 - Review Complete 07/04/2023  Allergen Reaction Noted   Cefdinir Other (See Comments) 05/10/2020   Pregabalin Other (See Comments) 02/24/2023   Sulfa antibiotics Hives and Rash 07/23/2014    Medications: No current facility-administered medications for this encounter.  Current Outpatient Medications:    acetaminophen  (TYLENOL ) 500 MG tablet, Take 500 mg by mouth every 6 (six) hours as needed (pain.)., Disp: , Rfl:    albuterol  (PROVENTIL ) (2.5 MG/3ML) 0.083% nebulizer solution, Take 2.5 mg by nebulization in the morning and at bedtime., Disp: , Rfl:    albuterol  (VENTOLIN  HFA) 108 (90 Base) MCG/ACT inhaler, Inhale 2 puffs into the lungs every 6 (six) hours as needed for wheezing or shortness of breath., Disp: , Rfl:    Calcium Citrate-Vitamin D (CALCIUM CITRATE + D3 PO), Take 1 tablet by mouth in the morning and at bedtime., Disp: , Rfl:    Cyanocobalamin (VITAMIN B 12 PO), Take 2,500 mcg by mouth in the morning., Disp: , Rfl:    DULoxetine  (CYMBALTA ) 20 MG capsule, Take 40 mg by mouth in the morning., Disp: , Rfl:    furosemide  (LASIX ) 20 MG tablet, Take 20 mg by mouth in the morning., Disp: , Rfl:    isosorbide mononitrate (IMDUR) 60 MG 24 hr tablet, Take 60 mg by mouth at bedtime., Disp: , Rfl:    levothyroxine  (SYNTHROID ) 112 MCG tablet, Take 112 mcg by mouth every other day. TAKE ONE TABLET EVERY OTHER DAY, ALTERNATING WITH 125 MCG DOSE, Disp: , Rfl:    levothyroxine  (SYNTHROID ) 125 MCG tablet, Take 125 mcg by mouth every other day. TAKE ONE TABLET EVERY OTHER DAY, ALTERNATING WITH 112 MCG DOSE, Disp: , Rfl:    liothyronine   (CYTOMEL ) 5 MCG tablet, Take 5 mcg by mouth daily before breakfast., Disp: , Rfl:    Magnesium  250 MG TABS, Take 200 mg by mouth every evening., Disp: , Rfl:    metoprolol tartrate (LOPRESSOR) 25 MG tablet, Take 25 mg by mouth 2 (two) times daily., Disp: , Rfl:    montelukast (SINGULAIR) 10 MG tablet, Take 10 mg by mouth at bedtime., Disp: , Rfl:    Multiple Vitamin (MULTIVITAMIN WITH MINERALS) TABS tablet, Take 1 tablet by mouth every evening., Disp: , Rfl:    OZEMPIC, 0.25 OR 0.5 MG/DOSE, 2 MG/3ML SOPN, Inject 0.25 mg into the skin every Monday., Disp: , Rfl:    pantoprazole  (PROTONIX ) 40 MG tablet, Take 40 mg by mouth daily before breakfast., Disp: , Rfl:    predniSONE  (DELTASONE ) 5 MG tablet, Take 5 mg by mouth in the morning., Disp: , Rfl:    QUEtiapine (SEROQUEL) 25 MG tablet, Take 25 mg by mouth at bedtime., Disp: , Rfl:  rOPINIRole  (REQUIP ) 2 MG tablet, Take 2 mg by mouth in the morning, at noon, and at bedtime., Disp: , Rfl:    spironolactone (ALDACTONE) 25 MG tablet, Take 12.5 mg by mouth every evening., Disp: , Rfl:    traMADol  (ULTRAM ) 50 MG tablet, Take 50 mg by mouth daily as needed (pain.)., Disp: , Rfl:    TRELEGY ELLIPTA 100-62.5-25 MCG/ACT AEPB, Inhale 1 puff into the lungs daily., Disp: , Rfl:    Turmeric 500 MG CAPS, Take 500 mg by mouth every evening., Disp: , Rfl:    Vibegron  (GEMTESA ) 75 MG TABS, Take 1 tablet (75 mg total) by mouth daily., Disp: 90 tablet, Rfl: 3   Social History: Social History   Tobacco Use   Smoking status: Never    Passive exposure: Never   Smokeless tobacco: Never  Vaping Use   Vaping status: Never Used  Substance Use Topics   Alcohol use: No   Drug use: No    Family Medical History: Family History  Problem Relation Age of Onset   Heart Problems Mother    Heart Problems Father    Heart attack Father    Diabetes Brother    Diabetes Brother     Physical Examination: Vitals:   07/04/23 0906  BP: 127/70  Pulse: 67  Resp: 20   Temp: 98 F (36.7 C)  SpO2: 96%     General: Patient is well developed, well nourished, calm, collected, and in no apparent distress.  NEUROLOGICAL:  General: In no acute distress.   Awake, alert, oriented to person, place, and time, does have difficulty with hearing at baseline.  Pupils equal round and reactive to light.  Facial tone is symmetric.  Tongue protrusion is midline.   Palpation of spine: Mild midline and paraspinal tenderness.    Strength:  Side Iliopsoas Quads Hamstring PF DF EHL  R 5 5 5 5 5 5   L 5 5 5 5 5 5     Bilateral upper and lower extremity sensation is intact to light touch. Reflexes are 1+ Clonus is not present.  Toes are down-going.    Imaging: DG Chest 2 View Result Date: 07/04/2023 CLINICAL DATA:  Shortness of breath.  History of lymphoma EXAM: CHEST - 2 VIEW COMPARISON:  Chest x-ray 01/30/2021.  Thoracic spine MRI 06/16/2023 FINDINGS: Underinflation. Normal cardiopericardial silhouette. Calcified aorta. There is some linear opacity at the left lung base likely scar or atelectasis. Bronchovascular crowding identified. Chronic interstitial lung changes are again seen. There is compression deformity with some kyphosis along the midthoracic spine corresponding to the finding by MRI of a T7 compression injury. Please correlate with prior workup IMPRESSION: Underinflation. Left basilar scar atelectasis. Chronic lung changes. Kyphosis with compression deformity along the midthoracic spine as previously described by MRI Electronically Signed   By: Adrianna Horde M.D.   On: 07/04/2023 09:58   Narrative & Impression  CLINICAL DATA:  T7 compression fracture. Worsening chronic pain. History of lymphoma.   EXAM: MRI THORACIC WITHOUT AND WITH CONTRAST   TECHNIQUE: Multiplanar and multiecho pulse sequences of the thoracic spine were obtained without and with intravenous contrast.   CONTRAST:  7mL GADAVIST  GADOBUTROL  1 MMOL/ML IV SOLN   COMPARISON:  CT abdomen and  pelvis 06/05/2023   FINDINGS: Alignment: Mild thoracic dextroscoliosis. Exaggerated thoracic kyphosis. Trace anterolisthesis of C4 on C5, C6 on C7, C7 on T1, T1 on T2, and L2 on L3.   Vertebrae: Abnormal T1 hypointensity, STIR hyperintensity, and enhancement throughout much of the  T6-T9 vertebral bodies and T7-T9 posterior elements. Pathologic T7 compression fracture with 75% vertebral body height loss. Homogeneously enhancing epidural tumor located predominantly laterally and dorsally from T6-T10, greatest at T7 and T8 where there is resultant moderate spinal stenosis. Tumor infiltrates the T7-8 and T8-9 neural foramina bilaterally. Paraspinal tumor from T6-T9 is greatest on the right at T8, measuring up to 1.7 cm in thickness.   Cord: Mild spinal cord mass effect at T7 related to epidural tumor and stenosis. No cord edema.   Paraspinal and other soft tissues: Paraspinal tumor as described above.   Disc levels:   Mild thoracic disc degeneration primarily in the midthoracic spine. Small noncompressive right paracentral disc protrusions at T8-9 and T9-10. More advanced disc degeneration at C5-6 and L1-2, not evaluated in detail on this thoracic spine study.   Findings discussed by telephone with Dr. Jodeen Munch on 07/02/2023 at 4:30 p.m.   IMPRESSION: Tumor involving the T6-T9 vertebrae and paraspinal soft tissues with associated severe pathologic T7 compression fracture and epidural tumor resulting in moderate spinal stenosis.     Electronically Signed   By: Aundra Lee M.D.   On: 07/02/2023 17:00      I have personally reviewed the images and agree with the above interpretation.  Labs:    Latest Ref Rng & Units 07/04/2023    9:00 AM 06/13/2023   12:57 PM 06/13/2023   12:56 PM  CBC  WBC 4.0 - 10.5 K/uL 7.2     Hemoglobin 12.0 - 15.0 g/dL 11.9  14.7  82.9   Hematocrit 36.0 - 46.0 % 38.1  35.0  36.0   Platelets 150 - 400 K/uL 226         Latest Ref Rng &  Units 07/04/2023    9:41 AM 06/13/2023   12:57 PM 06/13/2023   12:56 PM  BMP  Glucose 70 - 99 mg/dL 89     BUN 8 - 23 mg/dL 33     Creatinine 5.62 - 1.00 mg/dL 1.30     Sodium 865 - 784 mmol/L 135  137  137   Potassium 3.5 - 5.1 mmol/L 3.7  3.8  3.8   Chloride 98 - 111 mmol/L 96     CO2 22 - 32 mmol/L 29     Calcium 8.9 - 10.3 mg/dL 9.1           Assessment and Plan: [name] is a pleasant 89 y.o. female with new diagnosis of a thoracic spinal tumor with radicular pain.  She was called in after a MRI was read with a new tumor causing stenosis.  States that over the past 6 months has had some walking difficulty but it has not been acutely progressive.  She is having radiating rib pain that has been getting worse and she has been getting this worked up.  On physical examination she is nonfocal, she does have some radiating rib pain which is likely radicular in nature and from the compression noted on her MRI from 06/16/2023.  Given her history of lymphoma, is likely the diagnosis as her radiology appearance does appear to be consistent with hematogenous tumor origin.  Would recommend evaluation and discussion with oncology and interventional radiology for expedited evaluation diagnostics and treatment.  At this point there does not appear to be a neurosurgical indication for intervention.  She is having minimal midline back pain and does not tolerate bracing from previously so if she does develop some back pain can consider TLSO bracing although  this may interfere with her breathing  Carroll Clamp, MD/MSCR Dept. of Neurosurgery   Addendum: Discussed with interventional radiology, they feel there is an accessible target for IR guided biopsy for tissue diagnosis and expedited workup.  They will plan on coordinating.  Appears that this may be done as an outpatient early next week.  Neurosurgery remains available if any further questions.

## 2023-07-04 NOTE — Consult Note (Signed)
 [facility]  [phone]) [phone] [phone]  ID: Shirleen Double OB: Jul 16, 1932  MR#: [id number]  IHK#:[id number]  Patient Care Team: Antonio Baumgarten, MD as PCP - General (Internal Medicine)  REFERRING PROVIDER: Dr. Guss Legacy  REASON FOR REFERRAL: spinal tumor  ASSESSMENT AND PLAN:   [name] is a 89 y.o. female with pmh of atrial fibrillation, CKD, CHF, CAD with stents, hypertension, hide scented to [facility] ED on 07/04/2023 with concern for spinal tumor.   # Epidural tumor -Patient has prior history of non-Hodgkin's lymphoma.  I am unable to retrieve any records.  She has seen Dr. Serena Dana in the past.  And then had seen Dr. Wilhelmenia Harada in 2021.  -Dr. Jeris Montes, neurosurgery is on board and is planning for biopsy. -CT chest abdomen pelvis pending.  Will need PET scan as outpatient. -Will schedule a follow-up visit at [facility] as outpatient on discharge.  Patient expressed understanding and was in agreement with this plan. She also understands that She can call clinic at any time with any questions, concerns, or complaints.   I spent a total of 60 minutes reviewing chart data, face-to-face evaluation with the patient, counseling and coordination of care as detailed above.   HPI: [name] is a 89 y.o. female pmh of atrial fibrillation, CKD, CHF, CAD with stents, hypertension, hide scented to [facility] ED on 07/04/2023 with concern for spinal tumor.   Patient has been having tingling in her hands and her feet bilaterally.  She was also having ongoing back pain and saw Dr. Jhon Moselle of orthopedics she has been getting spinal injections for several years.  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  Pathologic T7 compression fracture.  Mild spinal cord mass effect at T7 related to epidural tumor.  Neurosurgery is on board and will be  planning for biopsy.  CT chest abdomen pelvis has been performed in the ED.  Read is pending.  Patient has previously followed with Dr. Wilhelmenia Harada last in 2021 with history of non-Hodgkin's lymphoma.  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  Patient was referred to endocrinology.   REVIEW OF SYSTEMS:   ROS  As per HPI. Otherwise, a complete review of systems is negative.  PAST MEDICAL HISTORY: Past Medical History:  Diagnosis Date   Afib (HCC)    a.) CHA2DS2-VASc = 6 (age x 2, sex, CHF, HTN, aortic plaque). b.) rate/rhythm maintained without pharmacological intervention; she is not on oral anticoagulation   Anxiety    Aortic atherosclerosis (HCC)    Arthritis    Basal cell carcinoma 11/11/2017   Right inferior knee. Superficial and nodular patterns.   Bilateral lower extremity edema    CKD (chronic kidney disease), stage III (HCC)    Congestive heart failure (CHF) (HCC) 05/31/2014   a.) TTE 05/31/2014: EF >55%, RVE; LAE; triv panvalvular regurg; G1DD. b.) TTE 06/29/2017: EF >55%; RVE; BAE; G1DD. c.) TTE 05/09/2018: EF 55-60%; LAE; mod MR, mild AR/TR. d.) TTE 04/29/2020: EF 60-65%; mi;d-mod MR/TR; RVSP 36.6 mmHg. e.) TTE 08/24/2020: EF >55%; mild LVH; mild MR/TR/PR; sev MAC.   COPD (chronic obstructive pulmonary disease) (HCC)    Coronary artery disease 06/03/2008   a.) LHC 06/03/2008: 40% pLAD-1, 60% pLAD-2, 90% mLAD-1, 30% mLAD-2, 50% mLAD-3 --> PCI placing a 2.0 x 12 mm Mini-Vision BMS x 1 to mLAD-1 lesion.  b.) LHC 02/12/10: 50% pLAD, 30% ISR mLAD, 25% dLAD, 25% pLCx, 30% mLCx, 20% pRCA, 20% D1; further intervention deferred opting for medical mgmt.   DDD (degenerative disc disease), lumbar    Depression    GERD (gastroesophageal reflux disease)    HOH (hard of hearing)    a.) uses BILATERAL hearing aids   Hurthle cell neoplasm of thyroid     a.) s/p total thyroidectomy 11/17/2019   Hyperlipemia    Hypertension    Hypothyroidism     Lumbar spinal stenosis    Memory loss    Non Hodgkin's lymphoma (HCC) 2002   Squamous cell carcinoma of skin 06/13/2020   L mid pretibia, biopsy only   Urinary incontinence     PAST SURGICAL HISTORY: Past Surgical History:  Procedure Laterality Date   CORONARY ANGIOPLASTY WITH STENT PLACEMENT Left 06/03/2008   Procedure: CORONARY ANGIOPLASTY WITH STENT PLACEMENT; Location: [facility]; Surgeon(s): Thomasene Flemings, MD (diagnostic) and Thais Fill, MD (interventional)   EXCISION OF ABDOMINAL WALL TUMOR Right 05/08/2018   Procedure: EXCISION OF ABDOMINAL WALL-RIGHT;  Surgeon: Eldred Grego, MD;  Location: [facility] ORS;  Service: General;  Laterality: Right;   INCISIONAL HERNIA REPAIR N/A 05/08/2018   Procedure: LAPAROSCOPIC VS. OPEN INCISIONAL HERNIA REPAIR WITH MESH;  Surgeon: Eldred Grego, MD;  Location: [facility] ORS;  Service: General;  Laterality: N/A;   LEFT HEART CATH AND CORONARY ANGIOGRAPHY Left 02/12/2010   Procedure: LEFT HEART CATH AND CORONARY ANGIOGRAPHY; Location: [facility]; Surgeon: Thomasene Flemings, MD   LESION REMOVAL Left 08/22/2021   Procedure: LESION REMOVAL (KNEE);  Surgeon: Jackquelyn Mass, MD;  Location: [facility] ORS;  Service: Vascular;  Laterality: Left;   RIGHT/LEFT HEART CATH AND CORONARY ANGIOGRAPHY Bilateral 06/13/2023   Procedure: RIGHT/LEFT HEART CATH AND CORONARY ANGIOGRAPHY;  Surgeon: Antonette Batters, MD;  Location: [facility] INVASIVE CV LAB;  Service: Cardiovascular;  Laterality: Bilateral;   THYROIDECTOMY Bilateral 11/17/2019   Procedure: TOTAL THYROIDECTOMY;  Surgeon: Mellody Sprout, MD;  Location: [facility] ORS;  Service: ENT;  Laterality: Bilateral;   TOTAL ABDOMINAL HYSTERECTOMY N/A    VEIN SURGERY     Endovenous ablation of saphenous vein    FAMILY HISTORY: Family History  Problem Relation Age of Onset   Heart Problems Mother    Heart Problems Father    Heart attack Father    Diabetes Brother    Diabetes Brother     HEALTH MAINTENANCE: Social History    Tobacco Use   Smoking status: Never    Passive exposure: Never   Smokeless tobacco: Never  Vaping Use   Vaping status: Never Used  Substance Use Topics   Alcohol use: No   Drug use: No     Allergies  Allergen Reactions   Cefdinir Other (See Comments)    Tolerated cefazolin  2020   Pregabalin Other (See Comments)    Vision loss   Sulfa Antibiotics Hives and Rash    Current Facility-Administered Medications  Medication Dose Route Frequency Provider Last Rate Last Admin   acetaminophen  (TYLENOL ) tablet 650 mg  650 mg Oral Q6H PRN Avi Body, MD       Or   acetaminophen  (TYLENOL ) suppository 650 mg  650 mg Rectal Q6H PRN Basaraba, Iulia, MD       HYDROmorphone (DILAUDID) injection 0.5 mg  0.5 mg Intravenous Q4H PRN Avi Body, MD       ondansetron  (ZOFRAN ) tablet 4 mg  4 mg Oral Q6H PRN Avi Body, MD       Or   ondansetron  (  ZOFRAN ) injection 4 mg  4 mg Intravenous Q6H PRN Avi Body, MD       oxyCODONE  (Oxy IR/ROXICODONE ) immediate release tablet 5-10 mg  5-10 mg Oral Q6H PRN Avi Body, MD       sodium chloride  flush (NS) 0.9 % injection 3 mL  3 mL Intravenous Q12H Avi Body, MD   3 mL at 07/04/23 1322   Current Outpatient Medications  Medication Sig Dispense Refill   albuterol  (PROVENTIL ) (2.5 MG/3ML) 0.083% nebulizer solution Take 2.5 mg by nebulization in the morning and at bedtime.     albuterol  (VENTOLIN  HFA) 108 (90 Base) MCG/ACT inhaler Inhale 2 puffs into the lungs 2 (two) times daily.     Calcium Citrate-Vitamin D (CALCIUM CITRATE + D3 PO) Take 1 tablet by mouth in the morning and at bedtime.     Cyanocobalamin (VITAMIN B 12 PO) Take 2,500 mcg by mouth in the morning.     DULoxetine  (CYMBALTA ) 20 MG capsule Take 40 mg by mouth in the morning.     furosemide  (LASIX ) 20 MG tablet Take 20 mg by mouth in the morning.     HYDROcodone -acetaminophen  (NORCO/VICODIN) 5-325 MG tablet Take 0.5 tablets by mouth every 6 (six) hours as needed for  moderate pain (pain score 4-6).     isosorbide mononitrate (IMDUR) 60 MG 24 hr tablet Take 60 mg by mouth at bedtime.     levothyroxine  (SYNTHROID ) 112 MCG tablet Take 112 mcg by mouth every other day. TAKE ONE TABLET EVERY OTHER DAY, ALTERNATING WITH 125 MCG DOSE     levothyroxine  (SYNTHROID ) 125 MCG tablet Take 125 mcg by mouth every other day. TAKE ONE TABLET EVERY OTHER DAY, ALTERNATING WITH 112 MCG DOSE     liothyronine  (CYTOMEL ) 5 MCG tablet Take 5 mcg by mouth daily before breakfast.     Magnesium  250 MG TABS Take 200 mg by mouth every evening.     metoprolol tartrate (LOPRESSOR) 25 MG tablet Take 25 mg by mouth 2 (two) times daily.     montelukast (SINGULAIR) 10 MG tablet Take 10 mg by mouth at bedtime.     Multiple Vitamin (MULTIVITAMIN WITH MINERALS) TABS tablet Take 1 tablet by mouth every evening.     OZEMPIC, 0.25 OR 0.5 MG/DOSE, 2 MG/3ML SOPN Inject 0.25 mg into the skin every Monday.     pantoprazole  (PROTONIX ) 40 MG tablet Take 40 mg by mouth daily before breakfast.     predniSONE  (DELTASONE ) 5 MG tablet Take 5 mg by mouth in the morning.     QUEtiapine (SEROQUEL) 25 MG tablet Take 25 mg by mouth at bedtime.     rOPINIRole  (REQUIP ) 2 MG tablet Take 2 mg by mouth in the morning, at noon, and at bedtime.     spironolactone (ALDACTONE) 25 MG tablet Take 12.5 mg by mouth every evening.     TRELEGY ELLIPTA 100-62.5-25 MCG/ACT AEPB Inhale 1 puff into the lungs daily.     Turmeric 500 MG CAPS Take 500 mg by mouth every evening.     Vibegron  (GEMTESA ) 75 MG TABS Take 1 tablet (75 mg total) by mouth daily. 90 tablet 3   acetaminophen  (TYLENOL ) 500 MG tablet Take 500 mg by mouth every 6 (six) hours as needed (pain.). (Patient not taking: Reported on 07/04/2023)     traMADol  (ULTRAM ) 50 MG tablet Take 50 mg by mouth daily as needed (pain.).      OBJECTIVE: Vitals:   07/04/23 1100 07/04/23 1339  BP: 116/66  Pulse: 70   Resp: 20   Temp:  98 F (36.7 C)  SpO2: 100%      Body mass  index is 28.44 kg/m.      General: Well-developed, well-nourished, no acute distress. Eyes: Pink conjunctiva, anicteric sclera. HEENT: Normocephalic, moist mucous membranes, clear oropharnyx. Lungs: Clear to auscultation bilaterally. Heart: Regular rate and rhythm. No rubs, murmurs, or gallops. Abdomen: Soft, nontender, nondistended. No organomegaly noted, normoactive bowel sounds. Musculoskeletal: No edema, cyanosis, or clubbing. Neuro: Alert, answering all questions appropriately. Cranial nerves grossly intact. Skin: No rashes or petechiae noted. Psych: Normal affect. Lymphatics: No cervical, calvicular, axillary or inguinal LAD.   LAB RESULTS:  Lab Results  Component Value Date   NA 135 07/04/2023   K 3.7 07/04/2023   CL 96 (L) 07/04/2023   CO2 29 07/04/2023   GLUCOSE 89 07/04/2023   BUN 33 (H) 07/04/2023   CREATININE 1.35 (H) 07/04/2023   CALCIUM 9.1 07/04/2023   PROT 5.7 (L) 04/30/2020   ALBUMIN 2.7 (L) 04/30/2020   AST 20 04/30/2020   ALT 17 04/30/2020   ALKPHOS 44 04/30/2020   BILITOT 0.8 04/30/2020   GFRNONAA 38 (L) 07/04/2023   GFRAA 43 (L) 11/05/2019    Lab Results  Component Value Date   WBC 7.2 07/04/2023   NEUTROABS 3.2 04/30/2020   HGB 12.6 07/04/2023   HCT 38.1 07/04/2023   MCV 95.0 07/04/2023   PLT 226 07/04/2023    No results found for: "TIBC", "FERRITIN", "IRONPCTSAT"   STUDIES: DG Chest 2 View Result Date: 07/04/2023 CLINICAL DATA:  Shortness of breath.  History of lymphoma EXAM: CHEST - 2 VIEW COMPARISON:  Chest x-ray 01/30/2021.  Thoracic spine MRI 06/16/2023 FINDINGS: Underinflation. Normal cardiopericardial silhouette. Calcified aorta. There is some linear opacity at the left lung base likely scar or atelectasis. Bronchovascular crowding identified. Chronic interstitial lung changes are again seen. There is compression deformity with some kyphosis along the midthoracic spine corresponding to the finding by MRI of a T7 compression injury.  Please correlate with prior workup IMPRESSION: Underinflation. Left basilar scar atelectasis. Chronic lung changes. Kyphosis with compression deformity along the midthoracic spine as previously described by MRI Electronically Signed   By: Adrianna Horde M.D.   On: 07/04/2023 09:58   MR THORACIC SPINE W WO CONTRAST Result Date: 07/02/2023 CLINICAL DATA:  T7 compression fracture. Worsening chronic pain. History of lymphoma. EXAM: MRI THORACIC WITHOUT AND WITH CONTRAST TECHNIQUE: Multiplanar and multiecho pulse sequences of the thoracic spine were obtained without and with intravenous contrast. CONTRAST:  7mL GADAVIST  GADOBUTROL  1 MMOL/ML IV SOLN COMPARISON:  CT abdomen and pelvis 06/05/2023 FINDINGS: Alignment: Mild thoracic dextroscoliosis. Exaggerated thoracic kyphosis. Trace anterolisthesis of C4 on C5, C6 on C7, C7 on T1, T1 on T2, and L2 on L3. Vertebrae: Abnormal T1 hypointensity, STIR hyperintensity, and enhancement throughout much of the T6-T9 vertebral bodies and T7-T9 posterior elements. Pathologic T7 compression fracture with 75% vertebral body height loss. Homogeneously enhancing epidural tumor located predominantly laterally and dorsally from T6-T10, greatest at T7 and T8 where there is resultant moderate spinal stenosis. Tumor infiltrates the T7-8 and T8-9 neural foramina bilaterally. Paraspinal tumor from T6-T9 is greatest on the right at T8, measuring up to 1.7 cm in thickness. Cord: Mild spinal cord mass effect at T7 related to epidural tumor and stenosis. No cord edema. Paraspinal and other soft tissues: Paraspinal tumor as described above. Disc levels: Mild thoracic disc degeneration primarily in the midthoracic spine. Small noncompressive right paracentral disc protrusions  at T8-9 and T9-10. More advanced disc degeneration at C5-6 and L1-2, not evaluated in detail on this thoracic spine study. Findings discussed by telephone with Dr. Jodeen Munch on 07/02/2023 at 4:30 p.m. IMPRESSION: Tumor  involving the T6-T9 vertebrae and paraspinal soft tissues with associated severe pathologic T7 compression fracture and epidural tumor resulting in moderate spinal stenosis. Electronically Signed   By: Aundra Lee M.D.   On: 07/02/2023 17:00   CARDIAC CATHETERIZATION Result Date: 06/14/2023   Mid LAD lesion is 50% stenosed.   1st Diag lesion is 90% stenosed.   Ost LAD to Mid LAD lesion is 50% stenosed.   Dist LAD lesion is 50% stenosed.   The left ventricular systolic function is normal.   LV end diastolic pressure is normal.   The left ventricular ejection fraction is 55-65% by visual estimate.   There is mild aortic valve stenosis.   There is no mitral valve regurgitation.   Recommend uninterrupted dual antiplatelet therapy with Aspirin  81mg  daily. Conclusion Right and Left heart cath SOB Anginal syndrome Right Heart Cath Mean Wedge 8 Mean PA 22  LVGram EF=55% Coronaries LMain minor irregularities LAD large minor irregularities , mid stent with 50% instent diffuse mid-distal 50% Dx1  90% proximal complex bifurcation Circ  minor irregularies RCA large minor irregularies Intervention deferred Consider complex complex intervention Will review images with heart team for further recommendations Consider cardiac MRI for further evaluation   CT ABDOMEN PELVIS W CONTRAST Result Date: 06/05/2023 CLINICAL DATA:  Right-sided abdominal pain, history of non-Hodgkin lymphoma EXAM: CT ABDOMEN AND PELVIS WITH CONTRAST TECHNIQUE: Multidetector CT imaging of the abdomen and pelvis was performed using the standard protocol following bolus administration of intravenous contrast. RADIATION DOSE REDUCTION: This exam was performed according to the departmental dose-optimization program which includes automated exposure control, adjustment of the mA and/or kV according to patient size and/or use of iterative reconstruction technique. CONTRAST:  80mL OMNIPAQUE  IOHEXOL  300 MG/ML  SOLN COMPARISON:  04/28/2020 FINDINGS: Lower chest:  No acute pleural or parenchymal lung disease. Hepatobiliary: No focal liver abnormality is seen. No gallstones, gallbladder wall thickening, or biliary dilatation. Pancreas: Unremarkable. No pancreatic ductal dilatation or surrounding inflammatory changes. Spleen: Normal in size without focal abnormality. Adrenals/Urinary Tract: Mild bilateral renal cortical atrophy. No urinary tract calculi or obstructive uropathy. The adrenals and bladder are unremarkable. Stomach/Bowel: No bowel obstruction or ileus. Diverticulosis of the descending and sigmoid colon. No evidence of acute diverticulitis. No bowel wall thickening or inflammatory change. Vascular/Lymphatic: Aortic atherosclerosis. No enlarged abdominal or pelvic lymph nodes. Reproductive: Status post hysterectomy. No adnexal masses. Other: No free fluid or free intraperitoneal gas. No abdominal wall hernia. Musculoskeletal: There is an age-indeterminate T7 compression deformity, with greater than 50% loss of height. Mild paraspinal soft tissue swelling at this location suggests acute to subacute fracture. While there is no significant retropulsion, there does appear to be soft tissue density causing significant central canal stenosis. If further evaluation is desired, MRI may be useful. No other acute bony abnormalities. Severe spondylosis and facet hypertrophy within the lower lumbar spine, most pronounced at L4-5 with significant central canal stenosis. This is a chronic stable finding. Reconstructed images demonstrate no additional findings. IMPRESSION: 1. Likely acute to subacute T7 compression fracture, with greater than 50% loss of height. Prominent paraspinal soft tissue swelling is noted, as well as soft tissue density in the central canal likely contributing to significant central canal stenosis. Further evaluation with MRI may be useful. 2. Distal colonic diverticulosis without  diverticulitis. 3.  Aortic Atherosclerosis (ICD10-I70.0). Electronically  Signed   By: Bobbye Burrow M.D.   On: 06/05/2023 21:07    Loreatha Rodney, MD   07/04/2023 2:12 PM

## 2023-07-04 NOTE — Care Management CC44 (Signed)
 Condition Code 44 Documentation Completed  Patient Details  Name: [name] MRN: [id number] Date of Birth: Nov 16, 1932   Condition Code 44 given:  Yes Patient signature on Condition Code 44 notice:  Yes Documentation of 2 MD's agreement:  Yes Code 44 added to claim:  Yes    Nellene Banana Nevin Grizzle, RN 07/04/2023, 4:19 PM

## 2023-07-04 NOTE — H&P (Signed)
 History and Physical    Patient: [name] AOZ:[id number] DOB: 1932-06-13 DOA: 07/04/2023 DOS: the patient was seen and examined on 07/04/2023 PCP: Antonio Baumgarten, MD  Patient coming from: Home  Chief Complaint:  Chief Complaint  Patient presents with   Abnormal MRI    HPI: [name] is a 89 y.o. female with medical history significant of Multivessel CAD s/p PCI with DES to mid LAD, HFpEF, atrial fibrillation not on AC, COPD, hypothyroidism, hypertension, hyperlipidemia, non-Hodgkin's lymphoma, follicular thyroid  carcinoma, CKD stage IIIb, osteoporosis, urinary incontinence, who presents to the ED due to abnormal MRI results.  [name] states that she has been experiencing left-sided rib, chest and abdominal pain that seems to radiate around to her back.  Due to this, her PCP ordered an abdomen CT that demonstrated fracture at T7.  She eventually underwent MRI and the results were read 2 days ago.  She was called by the neurosurgery office and recommended to come to the ED immediately.  [name] states that she continues to experience this pain, and it seems to be worsening.  Her daughter at bedside states that her ambulation seems more unstable, however [name] disagrees with this.  She denies any lower extremity weakness or paresthesia.  No fevers.  ED course: On arrival to the ED, patient was normotensive at 127/70 with heart rate of 66.  She was saturating at 96% on room air.  She was afebrile 98.  Initial workup notable for creatinine 1.35, GFR 38, and unremarkable CBC.  Neurosurgery consulted.  TR[facility] contacted for admission.  Review of Systems: As mentioned in the history of present illness. All other systems reviewed and are negative.  Past Medical History:  Diagnosis Date   Afib (HCC)    a.) CHA2DS2-VASc = 6 (age x 2, sex, CHF, HTN, aortic plaque). b.) rate/rhythm maintained without pharmacological intervention; she is not on oral anticoagulation   Anxiety     Aortic atherosclerosis (HCC)    Arthritis    Basal cell carcinoma 11/11/2017   Right inferior knee. Superficial and nodular patterns.   Bilateral lower extremity edema    CKD (chronic kidney disease), stage III (HCC)    Congestive heart failure (CHF) (HCC) 05/31/2014   a.) TTE 05/31/2014: EF >55%, RVE; LAE; triv panvalvular regurg; G1DD. b.) TTE 06/29/2017: EF >55%; RVE; BAE; G1DD. c.) TTE 05/09/2018: EF 55-60%; LAE; mod MR, mild AR/TR. d.) TTE 04/29/2020: EF 60-65%; mi;d-mod MR/TR; RVSP 36.6 mmHg. e.) TTE 08/24/2020: EF >55%; mild LVH; mild MR/TR/PR; sev MAC.   COPD (chronic obstructive pulmonary disease) (HCC)    Coronary artery disease 06/03/2008   a.) LHC 06/03/2008: 40% pLAD-1, 60% pLAD-2, 90% mLAD-1, 30% mLAD-2, 50% mLAD-3 --> PCI placing a 2.0 x 12 mm Mini-Vision BMS x 1 to mLAD-1 lesion. b.) LHC 02/12/10: 50% pLAD, 30% ISR mLAD, 25% dLAD, 25% pLCx, 30% mLCx, 20% pRCA, 20% D1; further intervention deferred opting for medical mgmt.   DDD (degenerative disc disease), lumbar    Depression    GERD (gastroesophageal reflux disease)    HOH (hard of hearing)    a.) uses BILATERAL hearing aids   Hurthle cell neoplasm of thyroid     a.) s/p total thyroidectomy 11/17/2019   Hyperlipemia    Hypertension    Hypothyroidism    Lumbar spinal stenosis    Memory loss    Non Hodgkin's lymphoma (HCC) 2002   Squamous cell carcinoma of skin 06/13/2020   L mid pretibia, biopsy only   Urinary  incontinence    Past Surgical History:  Procedure Laterality Date   CORONARY ANGIOPLASTY WITH STENT PLACEMENT Left 06/03/2008   Procedure: CORONARY ANGIOPLASTY WITH STENT PLACEMENT; Location: [facility]; Surgeon(s): Thomasene Flemings, MD (diagnostic) and Thais Fill, MD (interventional)   EXCISION OF ABDOMINAL WALL TUMOR Right 05/08/2018   Procedure: EXCISION OF ABDOMINAL WALL-RIGHT;  Surgeon: Eldred Grego, MD;  Location: [facility] OR[facility];  Service: General;  Laterality: Right;   INCISIONAL HERNIA REPAIR N/A  05/08/2018   Procedure: LAPAROSCOPIC VS. OPEN INCISIONAL HERNIA REPAIR WITH MESH;  Surgeon: Eldred Grego, MD;  Location: [facility];  Service: General;  Laterality: N/A;   LEFT HEART CATH AND CORONARY ANGIOGRAPHY Left 02/12/2010   Procedure: LEFT HEART CATH AND CORONARY ANGIOGRAPHY; Location: [facility]; Surgeon: Thomasene Flemings, MD   LESION REMOVAL Left 08/22/2021   Procedure: LESION REMOVAL (KNEE);  Surgeon: Jackquelyn Mass, MD;  Location: [facility];  Service: Vascular;  Laterality: Left;   RIGHT/LEFT HEART CATH AND CORONARY ANGIOGRAPHY Bilateral 06/13/2023   Procedure: RIGHT/LEFT HEART CATH AND CORONARY ANGIOGRAPHY;  Surgeon: Antonette Batters, MD;  Location: [facility];  Service: Cardiovascular;  Laterality: Bilateral;   THYROIDECTOMY Bilateral 11/17/2019   Procedure: TOTAL THYROIDECTOMY;  Surgeon: Mellody Sprout, MD;  Location: [facility];  Service: ENT;  Laterality: Bilateral;   TOTAL ABDOMINAL HYSTERECTOMY N/A    VEIN SURGERY     Endovenous ablation of saphenous vein   Social History:  reports that she has never smoked. She has never been exposed to tobacco smoke. She has never used smokeless tobacco. She reports that she does not drink alcohol and does not use drugs.  Allergies  Allergen Reactions   Cefdinir Other (See Comments)    Tolerated cefazolin  2020   Pregabalin Other (See Comments)    Vision loss   Sulfa Antibiotics Hives and Rash    Family History  Problem Relation Age of Onset   Heart Problems Mother    Heart Problems Father    Heart attack Father    Diabetes Brother    Diabetes Brother     Prior to Admission medications   Medication Sig Start Date End Date Taking? Authorizing Provider  acetaminophen  (TYLENOL ) 500 MG tablet Take 500 mg by mouth every 6 (six) hours as needed (pain.).    [provider]  albuterol  (PROVENTIL ) (2.5 MG/3ML) 0.083% nebulizer solution Take 2.5 mg by nebulization in the morning and at bedtime.    [provider]  albuterol  (VENTOLIN  HFA) 108 (90 Base) MCG/ACT inhaler Inhale 2 puffs into the lungs every 6 (six) hours as needed for wheezing or shortness of breath. 01/17/21   [provider]  Calcium Citrate-Vitamin D (CALCIUM CITRATE + D3 PO) Take 1 tablet by mouth in the morning and at bedtime.    [provider]  Cyanocobalamin (VITAMIN B 12 PO) Take 2,500 mcg by mouth in the morning.    [provider]  DULoxetine  (CYMBALTA ) 20 MG capsule Take 40 mg by mouth in the morning. 04/14/19   [provider]  furosemide  (LASIX ) 20 MG tablet Take 20 mg by mouth in the morning. 04/02/23   [provider]  isosorbide mononitrate (IMDUR) 60 MG 24 hr tablet Take 60 mg by mouth at bedtime.    [provider]  levothyroxine  (SYNTHROID ) 112 MCG tablet Take 112 mcg by mouth every other day. TAKE ONE TABLET EVERY OTHER DAY, ALTERNATING WITH 125 MCG DOSE 08/18/20   [provider]  levothyroxine  (SYNTHROID ) 125 MCG tablet Take 125  mcg by mouth every other day. TAKE ONE TABLET EVERY OTHER DAY, ALTERNATING WITH 112 MCG DOSE 01/29/21   [provider]  liothyronine  (CYTOMEL ) 5 MCG tablet Take 5 mcg by mouth daily before breakfast. 01/29/22   [provider]  Magnesium  250 MG TABS Take 200 mg by mouth every evening.    [provider]  metoprolol tartrate (LOPRESSOR) 25 MG tablet Take 25 mg by mouth 2 (two) times daily.    [provider]  montelukast (SINGULAIR) 10 MG tablet Take 10 mg by mouth at bedtime. 11/29/22   [provider]  Multiple Vitamin (MULTIVITAMIN WITH MINERALS) TABS tablet Take 1 tablet by mouth every evening.    [provider]  OZEMPIC, 0.25 OR 0.5 MG/DOSE, 2 MG/3ML SOPN Inject 0.25 mg into the skin every Monday. 11/09/21   [provider]  pantoprazole  (PROTONIX ) 40 MG tablet Take 40 mg by mouth daily before breakfast. 04/14/19   [provider]  predniSONE  (DELTASONE ) 5 MG tablet  Take 5 mg by mouth in the morning.    [provider]  QUEtiapine (SEROQUEL) 25 MG tablet Take 25 mg by mouth at bedtime.    [provider]  rOPINIRole  (REQUIP ) 2 MG tablet Take 2 mg by mouth in the morning, at noon, and at bedtime.    [provider]  spironolactone (ALDACTONE) 25 MG tablet Take 12.5 mg by mouth every evening.    [provider]  traMADol  (ULTRAM ) 50 MG tablet Take 50 mg by mouth daily as needed (pain.).    [provider]  TRELEGY ELLIPTA 100-62.5-25 MCG/ACT AEPB Inhale 1 puff into the lungs daily.    [provider]  Turmeric 500 MG CAPS Take 500 mg by mouth every evening.    [provider]  Vibegron  (GEMTESA ) 75 MG TABS Take 1 tablet (75 mg total) by mouth daily. 03/25/23   Kathreen Pare, PA-C    Physical Exam: Vitals:   07/04/23 0941 07/04/23 1030 07/04/23 1100 07/04/23 1339  BP:  123/60 116/66   Pulse:  66 70   Resp:  19 20   Temp:    98 F (36.7 C)  TempSrc:    Oral  SpO2:  100% 100%   Weight: 66 kg     Height: 5' (1.524 m)      Physical Exam Vitals and nursing note reviewed.  Constitutional:      General: She is not in acute distress.    Appearance: She is normal weight.  HENT:     Head: Normocephalic and atraumatic.     Mouth/Throat:     Mouth: Mucous membranes are moist.     Pharynx: Oropharynx is clear.  Eyes:     Conjunctiva/sclera: Conjunctivae normal.     Pupils: Pupils are equal, round, and reactive to light.  Neck:     Comments: No cervical adenopathy palpated Cardiovascular:     Rate and Rhythm: Normal rate and regular rhythm.     Heart sounds: Murmur heard.  Pulmonary:     Effort: Pulmonary effort is normal. No respiratory distress.     Breath sounds: Normal breath sounds. No wheezing, rhonchi or rales.  Abdominal:     General: Bowel sounds are normal.     Palpations: Abdomen is soft.  Musculoskeletal:     Right lower leg: No edema.     Left lower leg: No edema.      Comments: Back pain, chest, abdominal pain cannot be reproduced  Skin:  General: Skin is warm and dry.  Neurological:     Mental Status: She is alert.     Comments: No focal weakness noted  Psychiatric:        Mood and Affect: Mood normal.        Behavior: Behavior normal.    Data Reviewed: CBC with WBC of 7.2, hemoglobin of 12.6, platelets of 226 BMP with sodium of 135, potassium 3.7, bicarb 29, glucose 89, BUN 33, creatinine 1.25 and GFR 38 Troponin less than 2  EKG personally reviewed.  Sinus rhythm with rate of 68.  No acute ischemic changes.  DG Chest 2 View Result Date: 07/04/2023 CLINICAL DATA:  Shortness of breath.  History of lymphoma EXAM: CHEST - 2 VIEW COMPARISON:  Chest x-ray 01/30/2021.  Thoracic spine MRI 06/16/2023 FINDINGS: Underinflation. Normal cardiopericardial silhouette. Calcified aorta. There is some linear opacity at the left lung base likely scar or atelectasis. Bronchovascular crowding identified. Chronic interstitial lung changes are again seen. There is compression deformity with some kyphosis along the midthoracic spine corresponding to the finding by MRI of a T7 compression injury. Please correlate with prior workup IMPRESSION: Underinflation. Left basilar scar atelectasis. Chronic lung changes. Kyphosis with compression deformity along the midthoracic spine as previously described by MRI Electronically Signed   By: Adrianna Horde M.D.   On: 07/04/2023 09:58   Results are pending, will review when available.  Assessment and Plan:  * Mass of spinal cord [facility]) Patient is presenting with a tumor involving the T6-T9 vertebrae with associated severe compression fracture of the T7 moderate spinal stenosis.  Discussed with neurosurgery, who recommended discussing with IR.  Discussed with IR, who notes biopsy would be extremely high risk and recommending pan scan to assess for other possible biopsy sites prior to attempting spinal biopsy.  - Neurosurgery, IR, and  oncology consulted; appreciate their recommendations - CT chest abdomen pelvis with contrast pending - If no other primary sites identified, will need transfer to [name] for neuro IR evaluation - Pain control with Tylenol , oxycodone , Dilaudid - N.p.o.  Non-Hodgkin lymphoma (HCC) History of non-Hodgkin's lymphoma diagnosed in 2002 s/p chemotherapy with no recurrence.  No cervical lymphadenopathy on examination.  Congestive heart failure (CHF) (HCC) Appears euvolemic at this time.  - Continue home regimen  Coronary artery disease History of severe multivessel CAD s/p PCI with DES to mid LAD.  Underwent recent PCI, not amenable to stenting.  Patient was considered for CABG, however medical management was recommended.  High risk for general anesthesia.  - Continue home regimen  Chronic obstructive pulmonary disease (HCC) - DuoNebs as needed - Continue home triple bronchodilators  Chronic kidney disease (CKD), stage III (moderate) (HCC) History of CKD stage IIIb, with current creatinine of 1.35 consistent with baseline.  - Repeat BMP in the a.m. given contrast studies today  Advance Care Planning:   Code Status: Limited: Do not attempt resuscitation (DNR) -DNR-LIMITED -Do Not Intubate/DNI confirmed by patient with her daughter at bedside.  Consults: Neurosurgery, IR, oncology  Family Communication: Patient's daughter updated at bedside  Severity of Illness: The appropriate patient status for this patient is INPATIENT. Inpatient status is judged to be reasonable and necessary in order to provide the required intensity of service to ensure the patient's safety. The patient's presenting symptoms, physical exam findings, and initial radiographic and laboratory data in the context of their chronic comorbidities is felt to place them at high risk for further clinical deterioration. Furthermore, it is not anticipated that the  patient will be medically stable for discharge from the hospital  within 2 midnights of admission.   * I certify that at the point of admission it is my clinical judgment that the patient will require inpatient hospital care spanning beyond 2 midnights from the point of admission due to high intensity of service, high risk for further deterioration and high frequency of surveillance required.*  Author: Avi Body, MD 07/04/2023 2:15 PM  For on call review [email or web address].

## 2023-07-04 NOTE — Assessment & Plan Note (Addendum)
 Patient is presenting with a tumor involving the T6-T9 vertebrae with associated severe compression fracture of the T7 moderate spinal stenosis.  Discussed with neurosurgery, who recommended discussing with IR.  Discussed with IR, who notes biopsy would be extremely high risk and recommending pan scan to assess for other possible biopsy sites prior to attempting spinal biopsy.  - Neurosurgery, IR, and oncology consulted; appreciate their recommendations - CT chest abdomen pelvis with contrast pending - If no other primary sites identified, will need transfer to [facility] for neuro IR evaluation - Pain control with Tylenol , oxycodone , Dilaudid - N.p.o.

## 2023-07-04 NOTE — Discharge Instructions (Addendum)
 Ms. [name], [name] were seen in the hospital due to a spinal mass that is pushing on your spinal cord, causing the left-sided pain you are experiencing.  While here, you were seen by neurosurgery and oncology.  We have also contacted interventional radiology, who will perform your biopsy next week.  If you notice any muscle weakness, inability to walk, numbness or tingling in the hands or feet, please return to the ED.  It was a pleasure meeting you and your daughter, and I wish you the best.

## 2023-07-04 NOTE — ED Notes (Addendum)
 Lab called w/ Calcium level of 5, EDP notified.

## 2023-07-04 NOTE — Progress Notes (Signed)
 Outpatient order for epidural mass biopsy was placed as requested by IR.  Dr. Wilhelmenia Harada made aware.  She will arrange for outpatient follow-up after IR biopsy.

## 2023-07-04 NOTE — ED Provider Notes (Signed)
 [facility] Provider Note    Event Date/Time   First MD Initiated Contact with Patient 07/04/23 0920     (approximate)  History   Chief Complaint: Abnormal MRI   HPI  [name] is a 89 y.o. female with a past medical history of atrial fibrillation, anxiety, CKD, CHF, CAD with stents, hypertension, hyperlipidemia, presents to the emergency department for a spinal tumor.  According to the patient and family member patient has been having somewhat of a progressive decline since 2021.  Patient describes tingling/neuropathy symptoms in her hands and her feet bilaterally but states this has been ongoing for quite some time.  Also states history of chest pain but again states this has been ongoing for quite some time as well (years per family).  Patient has ongoing back pain she has been seeing Dr. Mozell Arias of orthopedics has undergone spinal injections over the years with some relief.  However given worsening pain an MRI was performed April 21 which was read this week showing tumor of T6-T9 with some compression as well.  Patient denies any new numbness or weakness symptoms.  Patient states she was told by Dr. Jeris Montes of neurosurgery that she needs to be admitted to the hospital for a biopsy.  Physical Exam   Triage Vital Signs: ED Triage Vitals [07/04/23 0906]  Encounter Vitals Group     BP 127/70     Systolic BP Percentile      Diastolic BP Percentile      Pulse Rate 67     Resp 20     Temp 98 F (36.7 C)     Temp Source Oral     SpO2 96 %     Weight      Height      Head Circumference      Peak Flow      Pain Score 7     Pain Loc      Pain Education      Exclude from Growth Chart     Most recent vital signs: Vitals:   07/04/23 0906  BP: 127/70  Pulse: 67  Resp: 20  Temp: 98 F (36.7 C)  SpO2: 96%    General: Awake, no distress.  CV:  Good peripheral perfusion.  Regular rate and rhythm  Resp:  Normal effort.  Equal breath sounds  bilaterally.  Abd:  No distention.  Soft, nontender.  No rebound or guarding.  ED Results / Procedures / Treatments   EKG  EKG viewed and interpreted by myself shows a normal sinus rhythm at 68 bpm with a narrow QRS, normal axis, normal intervals, nonspecific but no concerning ST changes.  RADIOLOGY  I have reviewed and interpreted the chest x-ray images.  Shallow inspiration but no obvious consolidation on my examination.   MEDICATIONS ORDERED IN ED: Medications - No data to display   IMPRESSION / MDM / ASSESSMENT AND PLAN / ED COURSE  I reviewed the triage vital signs and the nursing notes.  Patient's presentation is most consistent with acute presentation with potential threat to life or bodily function.  Patient presents to the emergency department for back pain with a recent MRI showing tumor of T6-T9.  I spoke to Dr. Henderson Lock of neurosurgery.  He states the patient will need a CT scan of the chest abdomen pelvis with contrast to evaluate for any further oncologic process.  Will need oncology consultation as well as possible IR versus neurosurgery biopsy.  Will make  the patient n.p.o. while completing her workup.  Patient did complain of some chest pain although states this has been ongoing times years we will obtain labs including cardiac enzyme and EKG as a precaution.  Patient's lab work has resulted showing a reassuring CBC, reassuring chemistry and negative troponin x 2.  I have ordered CT imaging of the chest abdomen and pelvis for staging purposes.  Will admit to the hospitalist service for further workup and treatment.  FINAL CLINICAL IMPRESSION(S) / ED DIAGNOSES   Spinal tumor    Note:  This document was prepared using Dragon voice recognition software and may include unintentional dictation errors.   Ruth Cove, MD 07/04/23 [phone]

## 2023-07-04 NOTE — ED Notes (Signed)
 Pt was at imaging when this RN went to assess new Pt

## 2023-07-04 NOTE — Discharge Summary (Signed)
 Physician Discharge Summary   Patient: [name] MRN: [id number] DOB: 1932-03-17  Admit date:     07/04/2023  Discharge date: {dischdate:26783}  Discharge Physician: Avi Body   PCP: Antonio Baumgarten, MD   Recommendations at discharge:  {Tip this will not be part of the note when signed- Example include specific recommendations for outpatient follow-up, pending tests to follow-up on. (Optional):26781}  ***  Discharge Diagnoses: Principal Problem:   Mass of spinal cord (HCC) Active Problems:   Chronic kidney disease (CKD), stage III (moderate) (HCC)   Chronic obstructive pulmonary disease (HCC)   Coronary artery disease   Congestive heart failure (CHF) (HCC)   Non-Hodgkin lymphoma (HCC)  Resolved Problems:   * No resolved hospital problems. [name] Course: No notes on file  Assessment and Plan: * Mass of spinal cord [name]) Patient is presenting with a tumor involving the T6-T9 vertebrae with associated severe compression fracture of the T7 moderate spinal stenosis.  Discussed with neurosurgery, who recommended discussing with IR.  Discussed with IR, who notes biopsy would be extremely high risk and recommending pan scan to assess for other possible biopsy sites prior to attempting spinal biopsy.  - Neurosurgery, IR, and oncology consulted; appreciate their recommendations - CT chest abdomen pelvis with contrast pending - If no other primary sites identified, will need transfer to [name] for neuro IR evaluation - Pain control with Tylenol , oxycodone , Dilaudid - N.p.o.  Non-Hodgkin lymphoma (HCC) History of non-Hodgkin's lymphoma diagnosed in 2002 s/p chemotherapy with no recurrence.  No cervical lymphadenopathy on examination.  Congestive heart failure (CHF) (HCC) Appears euvolemic at this time.  - Continue home regimen  Coronary artery disease History of severe multivessel CAD s/p PCI with DES to mid LAD.  Underwent recent PCI, not amenable to stenting.   Patient was considered for CABG, however medical management was recommended.  High risk for general anesthesia.  - Continue home regimen  Chronic obstructive pulmonary disease (HCC) - DuoNebs as needed - Continue home triple bronchodilators  Chronic kidney disease (CKD), stage III (moderate) (HCC) History of CKD stage IIIb, with current creatinine of 1.35 consistent with baseline.  - Repeat BMP in the a.m. given contrast studies today      {Tip this will not be part of the note when signed Body mass index is 28.44 kg/m. , ,  (Optional):26781}  {(NOTE) Pain control PDMP Statment (Optional):26782} Consultants: *** Procedures performed: ***  Disposition: {Plan; Disposition:26390} Diet recommendation:  {Diet_Plan:26776} DISCHARGE MEDICATION: Allergies as of 07/04/2023       Reactions   Cefdinir Other (See Comments)   Tolerated cefazolin  2020   Pregabalin Other (See Comments)   Vision loss   Sulfa Antibiotics Hives, Rash        Medication List     TAKE these medications    acetaminophen  500 MG tablet Commonly known as: TYLENOL  Take 500 mg by mouth every 6 (six) hours as needed (pain.).   albuterol  (2.5 MG/3ML) 0.083% nebulizer solution Commonly known as: PROVENTIL  Take 2.5 mg by nebulization in the morning and at bedtime.   albuterol  108 (90 Base) MCG/ACT inhaler Commonly known as: VENTOLIN  HFA Inhale 2 puffs into the lungs 2 (two) times daily.   CALCIUM CITRATE + D3 PO Take 1 tablet by mouth in the morning and at bedtime.   DULoxetine  20 MG capsule Commonly known as: CYMBALTA  Take 40 mg by mouth in the morning.   furosemide  20 MG tablet Commonly known as: LASIX  Take 20 mg by  mouth in the morning.   Gemtesa  75 MG Tabs Generic drug: Vibegron  Take 1 tablet (75 mg total) by mouth daily.   HYDROcodone -acetaminophen  5-325 MG tablet Commonly known as: NORCO/VICODIN Take 0.5 tablets by mouth every 6 (six) hours as needed for moderate pain (pain score  4-6).   isosorbide mononitrate 60 MG 24 hr tablet Commonly known as: IMDUR Take 60 mg by mouth at bedtime.   levothyroxine  112 MCG tablet Commonly known as: SYNTHROID  Take 112 mcg by mouth every other day. TAKE ONE TABLET EVERY OTHER DAY, ALTERNATING WITH 125 MCG DOSE   levothyroxine  125 MCG tablet Commonly known as: SYNTHROID  Take 125 mcg by mouth every other day. TAKE ONE TABLET EVERY OTHER DAY, ALTERNATING WITH 112 MCG DOSE   liothyronine  5 MCG tablet Commonly known as: CYTOMEL  Take 5 mcg by mouth daily before breakfast.   Magnesium  250 MG Tabs Take 200 mg by mouth every evening.   metoprolol tartrate 25 MG tablet Commonly known as: LOPRESSOR Take 25 mg by mouth 2 (two) times daily.   montelukast 10 MG tablet Commonly known as: SINGULAIR Take 10 mg by mouth at bedtime.   multivitamin with minerals Tabs tablet Take 1 tablet by mouth every evening.   Ozempic (0.25 or 0.5 MG/DOSE) 2 MG/3ML Sopn Generic drug: Semaglutide(0.25 or 0.5MG /DOS) Inject 0.25 mg into the skin every Monday.   pantoprazole  40 MG tablet Commonly known as: PROTONIX  Take 40 mg by mouth daily before breakfast.   predniSONE  5 MG tablet Commonly known as: DELTASONE  Take 5 mg by mouth in the morning.   rOPINIRole  2 MG tablet Commonly known as: REQUIP  Take 2 mg by mouth in the morning, at noon, and at bedtime.   SEROquel 25 MG tablet Generic drug: QUEtiapine Take 25 mg by mouth at bedtime.   spironolactone 25 MG tablet Commonly known as: ALDACTONE Take 12.5 mg by mouth every evening.   traMADol  50 MG tablet Commonly known as: ULTRAM  Take 50 mg by mouth daily as needed (pain.).   Trelegy Ellipta 100-62.5-25 MCG/ACT Aepb Generic drug: Fluticasone -Umeclidin-Vilant Inhale 1 puff into the lungs daily.   Turmeric 500 MG Caps Take 500 mg by mouth every evening.   VITAMIN B 12 PO Take 2,500 mcg by mouth in the morning.        Discharge Exam: Filed Weights   07/04/23 0941  Weight: 66  kg   ***  Condition at discharge: {DC Condition:26389}  The results of significant diagnostics from this hospitalization (including imaging, microbiology, ancillary and laboratory) are listed below for reference.   Imaging Studies: DG Chest 2 View Result Date: 07/04/2023 CLINICAL DATA:  Shortness of breath.  History of lymphoma EXAM: CHEST - 2 VIEW COMPARISON:  Chest x-ray 01/30/2021.  Thoracic spine MRI 06/16/2023 FINDINGS: Underinflation. Normal cardiopericardial silhouette. Calcified aorta. There is some linear opacity at the left lung base likely scar or atelectasis. Bronchovascular crowding identified. Chronic interstitial lung changes are again seen. There is compression deformity with some kyphosis along the midthoracic spine corresponding to the finding by MRI of a T7 compression injury. Please correlate with prior workup IMPRESSION: Underinflation. Left basilar scar atelectasis. Chronic lung changes. Kyphosis with compression deformity along the midthoracic spine as previously described by MRI Electronically Signed   By: Adrianna Horde M.D.   On: 07/04/2023 09:58   MR THORACIC SPINE W WO CONTRAST Result Date: 07/02/2023 CLINICAL DATA:  T7 compression fracture. Worsening chronic pain. History of lymphoma. EXAM: MRI THORACIC WITHOUT AND WITH CONTRAST TECHNIQUE: Multiplanar and  multiecho pulse sequences of the thoracic spine were obtained without and with intravenous contrast. CONTRAST:  7mL GADAVIST  GADOBUTROL  1 MMOL/ML IV SOLN COMPARISON:  CT abdomen and pelvis 06/05/2023 FINDINGS: Alignment: Mild thoracic dextroscoliosis. Exaggerated thoracic kyphosis. Trace anterolisthesis of C4 on C5, C6 on C7, C7 on T1, T1 on T2, and L2 on L3. Vertebrae: Abnormal T1 hypointensity, STIR hyperintensity, and enhancement throughout much of the T6-T9 vertebral bodies and T7-T9 posterior elements. Pathologic T7 compression fracture with 75% vertebral body height loss. Homogeneously enhancing epidural tumor located  predominantly laterally and dorsally from T6-T10, greatest at T7 and T8 where there is resultant moderate spinal stenosis. Tumor infiltrates the T7-8 and T8-9 neural foramina bilaterally. Paraspinal tumor from T6-T9 is greatest on the right at T8, measuring up to 1.7 cm in thickness. Cord: Mild spinal cord mass effect at T7 related to epidural tumor and stenosis. No cord edema. Paraspinal and other soft tissues: Paraspinal tumor as described above. Disc levels: Mild thoracic disc degeneration primarily in the midthoracic spine. Small noncompressive right paracentral disc protrusions at T8-9 and T9-10. More advanced disc degeneration at C5-6 and L1-2, not evaluated in detail on this thoracic spine study. Findings discussed by telephone with Dr. Jodeen Munch on 07/02/2023 at 4:30 p.m. IMPRESSION: Tumor involving the T6-T9 vertebrae and paraspinal soft tissues with associated severe pathologic T7 compression fracture and epidural tumor resulting in moderate spinal stenosis. Electronically Signed   By: Aundra Lee M.D.   On: 07/02/2023 17:00   CARDIAC CATHETERIZATION Result Date: 06/14/2023   Mid LAD lesion is 50% stenosed.   1st Diag lesion is 90% stenosed.   Ost LAD to Mid LAD lesion is 50% stenosed.   Dist LAD lesion is 50% stenosed.   The left ventricular systolic function is normal.   LV end diastolic pressure is normal.   The left ventricular ejection fraction is 55-65% by visual estimate.   There is mild aortic valve stenosis.   There is no mitral valve regurgitation.   Recommend uninterrupted dual antiplatelet therapy with Aspirin  81mg  daily. Conclusion Right and Left heart cath SOB Anginal syndrome Right Heart Cath Mean Wedge 8 Mean PA 22  LVGram EF=55% Coronaries LMain minor irregularities LAD large minor irregularities , mid stent with 50% instent diffuse mid-distal 50% Dx1  90% proximal complex bifurcation Circ  minor irregularies RCA large minor irregularies Intervention deferred Consider complex  complex intervention Will review images with heart team for further recommendations Consider cardiac MRI for further evaluation   CT ABDOMEN PELVIS W CONTRAST Result Date: 06/05/2023 CLINICAL DATA:  Right-sided abdominal pain, history of non-Hodgkin lymphoma EXAM: CT ABDOMEN AND PELVIS WITH CONTRAST TECHNIQUE: Multidetector CT imaging of the abdomen and pelvis was performed using the standard protocol following bolus administration of intravenous contrast. RADIATION DOSE REDUCTION: This exam was performed according to the departmental dose-optimization program which includes automated exposure control, adjustment of the mA and/or kV according to patient size and/or use of iterative reconstruction technique. CONTRAST:  80mL OMNIPAQUE  IOHEXOL  300 MG/ML  SOLN COMPARISON:  04/28/2020 FINDINGS: Lower chest: No acute pleural or parenchymal lung disease. Hepatobiliary: No focal liver abnormality is seen. No gallstones, gallbladder wall thickening, or biliary dilatation. Pancreas: Unremarkable. No pancreatic ductal dilatation or surrounding inflammatory changes. Spleen: Normal in size without focal abnormality. Adrenals/Urinary Tract: Mild bilateral renal cortical atrophy. No urinary tract calculi or obstructive uropathy. The adrenals and bladder are unremarkable. Stomach/Bowel: No bowel obstruction or ileus. Diverticulosis of the descending and sigmoid colon. No evidence of acute diverticulitis.  No bowel wall thickening or inflammatory change. Vascular/Lymphatic: Aortic atherosclerosis. No enlarged abdominal or pelvic lymph nodes. Reproductive: Status post hysterectomy. No adnexal masses. Other: No free fluid or free intraperitoneal gas. No abdominal wall hernia. Musculoskeletal: There is an age-indeterminate T7 compression deformity, with greater than 50% loss of height. Mild paraspinal soft tissue swelling at this location suggests acute to subacute fracture. While there is no significant retropulsion, there does  appear to be soft tissue density causing significant central canal stenosis. If further evaluation is desired, MRI may be useful. No other acute bony abnormalities. Severe spondylosis and facet hypertrophy within the lower lumbar spine, most pronounced at L4-5 with significant central canal stenosis. This is a chronic stable finding. Reconstructed images demonstrate no additional findings. IMPRESSION: 1. Likely acute to subacute T7 compression fracture, with greater than 50% loss of height. Prominent paraspinal soft tissue swelling is noted, as well as soft tissue density in the central canal likely contributing to significant central canal stenosis. Further evaluation with MRI may be useful. 2. Distal colonic diverticulosis without diverticulitis. 3.  Aortic Atherosclerosis (ICD10-I70.0). Electronically Signed   By: Bobbye Burrow M.D.   On: 06/05/2023 21:07    Microbiology: Results for orders placed or performed in visit on 04/23/21  Microscopic Examination     Status: None   Collection Time: 04/23/21  4:35 PM   Urine  Result Value Ref Range Status   WBC, UA 0-5 0 - 5 /hpf Final   RBC, Urine 0-2 0 - 2 /hpf Final   Epithelial Cells (non renal) None seen 0 - 10 /hpf Final   Mucus, UA None seen Not Estab. Final  CULTURE, URINE COMPREHENSIVE     Status: None   Collection Time: 04/23/21  4:49 PM   Specimen: Urine   UR  Result Value Ref Range Status   Urine Culture, Comprehensive Final report  Final   Organism ID, Bacteria Comment  Final    Comment: No growth in 36 - 48 hours.    Labs: CBC: Recent Labs  Lab 07/04/23 0900  WBC 7.2  HGB 12.6  HCT 38.1  MCV 95.0  PLT 226   Basic Metabolic Panel: Recent Labs  Lab 07/04/23 0900 07/04/23 0941  NA SPECIMEN CONTAMINATED, UNABLE TO PERFORM TEST(S). 135  K SPECIMEN CONTAMINATED, UNABLE TO PERFORM TEST(S). 3.7  CL SPECIMEN CONTAMINATED, UNABLE TO PERFORM TEST(S). 96*  CO2 SPECIMEN CONTAMINATED, UNABLE TO PERFORM TEST(S). 29  GLUCOSE  SPECIMEN CONTAMINATED, UNABLE TO PERFORM TEST(S). 89  BUN SPECIMEN CONTAMINATED, UNABLE TO PERFORM TEST(S). 33*  CREATININE SPECIMEN CONTAMINATED, UNABLE TO PERFORM TEST(S). 1.35*  CALCIUM SPECIMEN CONTAMINATED, UNABLE TO PERFORM TEST(S). 9.1   Liver Function Tests: No results for input(s): "AST", "ALT", "ALKPHOS", "BILITOT", "PROT", "ALBUMIN" in the last 168 hours. CBG: No results for input(s): "GLUCAP" in the last 168 hours.  Discharge time spent: {LESS THAN/GREATER WGNF:62130} 30 minutes.  Signed: Avi Body, MD [facility] 07/04/2023

## 2023-07-04 NOTE — Assessment & Plan Note (Signed)
-   DuoNebs as needed - Continue home triple bronchodilators

## 2023-07-04 NOTE — Assessment & Plan Note (Signed)
 History of severe multivessel CAD s/p PCI with DES to mid LAD.  Underwent recent PCI, not amenable to stenting.  Patient was considered for CABG, however medical management was recommended.  High risk for general anesthesia.  - Continue home regimen

## 2023-07-04 NOTE — Assessment & Plan Note (Signed)
 Appears euvolemic at this time.  - Continue home regimen

## 2023-07-04 NOTE — ED Triage Notes (Signed)
 Pt to ED via POV from home. Pt reports had MRI that showed tumor in her spine with spinal compression. Pt sent for biopsy of tumor. Pt seen by Dr. Jeris Montes. Pt reports back to mid/upper back. Pt also reports SOB and chest pain.

## 2023-07-04 NOTE — ED Notes (Signed)
 Primary RN called to inform pt has been roomed and on monitor.

## 2023-07-04 NOTE — Assessment & Plan Note (Signed)
 History of CKD stage IIIb, with current creatinine of 1.35 consistent with baseline.  - Repeat BMP in the a.m. given contrast studies today

## 2023-07-07 ENCOUNTER — Telehealth: Payer: Self-pay

## 2023-07-07 NOTE — Telephone Encounter (Signed)
 Dr. Aris Bel saw pt in ER and recommended/ and placed order for spinal biopsy.   Spoke to pt's daughter Rodrigo Clara, who states that they prefer for pt to have bx done and then see MD for results. She states they are waiting to hear back from IR to get scheduled.   Spoke with Leary Provencal in IR this morning and she states that they are trying to see if bx should be done nt [place] and if she will need a pan scan first, before setting up bx.

## 2023-07-09 NOTE — Progress Notes (Signed)
 Roxie Cord, MD sent to Darleen Ee, DO Approved for right transpedicular bx of the T8 vertebral body.  CC'ing Dr. Mabel Savage as it will likely fall on his day.  Concern for lymphoma so samples in saline, please.  Ordering team not interested in [facility] right now, just tissue dx.  HKM

## 2023-07-09 NOTE — Telephone Encounter (Signed)
 Pt is scheduled for biopsy on Mond 5/19 @ 1p and arrive at 12p. Leary Provencal has talked to pt's daughter and made her aware of bx appt details.   please contact daughter, [name], to set up follow up- approx 1 week after bx.

## 2023-07-11 ENCOUNTER — Other Ambulatory Visit: Payer: Self-pay | Admitting: Radiology

## 2023-07-11 DIAGNOSIS — G9589 Other specified diseases of spinal cord: Secondary | ICD-10-CM

## 2023-07-11 NOTE — Telephone Encounter (Signed)
 Called pt's phone and no answer. Detailed message left to call back to set up appt.

## 2023-07-11 NOTE — Progress Notes (Signed)
 Patient for IR RT transpedicular biopsy of the T8 vertebral body on Monday 07/14/23, I called and spoke with the patient's daughter, Rodrigo Clara on the phone and gave pre-procedure instructions. Scarlett was made aware to have the patient here at 12p, NPO after MN prior to procedure as well as driver post procedure/recovery/discharge. Scarlett stated understanding.  Called 07/09/23

## 2023-07-13 NOTE — H&P (Addendum)
 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  Referring Provider(s): Dr. Loreatha Rodney, MD   Supervising Physician: Robbi Childs  Patient Status: [facility] - Out-pt  Patient is Full Code  History of Present Illness: [name] is a 89 y.o. female  with PMHx notable for HTN, HLD, Afib, CAD, CHF, CKD stage III, COPD, non-Hodgkin's lymphoma, hypothyroidism, degenerative disk disease with lumbar stenosis, arthritis, basal cell carcinoma, squamous cell carcinoma, urinary incontinence, dementia, anxiety, and depression.  Per Dr. Loann Rily consult note on 5/9: "[Patient was sent] to [facility] ED on 07/04/2023 with concern for spinal tumor.    Patient has been having tingling in her hands and her feet bilaterally.  She was also having ongoing back pain and saw Dr. Jhon Moselle of orthopedics she has been getting spinal injections for several years.  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  Pathologic T7 compression fracture.  Mild spinal cord mass effect at T7 related to epidural tumor.   Neurosurgery is on board and will be planning for biopsy.   CT chest abdomen pelvis has been performed in the ED.  Read is pending.   Patient has previously followed with Dr. Wilhelmenia Harada last in 2021 with history of non-Hodgkin's lymphoma.  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  Patient was referred to endocrinology.  [...] Epidural tumor -Patient has prior history of non-Hodgkin's lymphoma.  I am unable to retrieve any records.  She has seen Dr. Serena Dana in the past.  And then had seen Dr. Wilhelmenia Harada in 2021.   -Dr. Jeris Montes, neurosurgery is on board and is planning for biopsy. -CT chest abdomen pelvis pending.  Will need PET scan as outpatient. -Will schedule a follow-up  visit at [facility] as outpatient on discharge."   Interventional Radiology was requested for right transpedicular biopsy of the T8 vertebral body . Request was reviewed and approved by Dr. Marne Sings. Patient is scheduled for same in IR today.   Patient is alert and laying in bed, calm.  Patient is currently without any significant complaints.  Patient denies any fevers, headache, chest pain, SOB, cough, abdominal pain, nausea, vomiting or bleeding.     Past Medical History:  Diagnosis Date   Afib (HCC)    a.) CHA2DS2-VASc = 6 (age x 2, sex, CHF, HTN, aortic plaque). b.) rate/rhythm maintained without pharmacological intervention; she is not on oral anticoagulation   Anxiety    Aortic atherosclerosis (HCC)    Arthritis    Basal cell carcinoma 11/11/2017   Right inferior knee. Superficial and nodular patterns.   Bilateral lower extremity edema    CKD (chronic kidney disease), stage III (HCC)    Congestive heart failure (CHF) (HCC) 05/31/2014   a.) TTE 05/31/2014: EF >55%, RVE; LAE; triv panvalvular regurg; G1DD. b.) TTE 06/29/2017: EF >55%; RVE; BAE; G1DD. c.) TTE 05/09/2018: EF 55-60%; LAE; mod MR, mild AR/TR. d.) TTE 04/29/2020: EF 60-65%; mi;d-mod MR/TR; RVSP 36.6 mmHg. e.) TTE 08/24/2020: EF >55%; mild LVH; mild MR/TR/PR; sev MAC.   COPD (chronic obstructive pulmonary disease) (HCC)    Coronary artery disease 06/03/2008   a.) LHC 06/03/2008: 40% pLAD-1, 60% pLAD-2, 90% mLAD-1, 30% mLAD-2, 50% mLAD-3 --> PCI placing a 2.0 x 12 mm Mini-Vision BMS x 1 to mLAD-1 lesion. b.) LHC  02/12/10: 50% pLAD, 30% ISR mLAD, 25% dLAD, 25% pLCx, 30% mLCx, 20% pRCA, 20% D1; further intervention deferred opting for medical mgmt.   DDD (degenerative disc disease), lumbar    Depression    GERD (gastroesophageal reflux disease)    HOH (hard of hearing)    a.) uses BILATERAL hearing aids   Hurthle cell neoplasm of thyroid     a.) s/p total thyroidectomy 11/17/2019   Hyperlipemia    Hypertension     Hypothyroidism    Lumbar spinal stenosis    Memory loss    Non Hodgkin's lymphoma (HCC) 2002   Squamous cell carcinoma of skin 06/13/2020   L mid pretibia, biopsy only   Urinary incontinence     Past Surgical History:  Procedure Laterality Date   CORONARY ANGIOPLASTY WITH STENT PLACEMENT Left 06/03/2008   Procedure: CORONARY ANGIOPLASTY WITH STENT PLACEMENT; Location: [facility]; Surgeon(s): Thomasene Flemings, MD (diagnostic) and Thais Fill, MD (interventional)   EXCISION OF ABDOMINAL WALL TUMOR Right 05/08/2018   Procedure: EXCISION OF ABDOMINAL WALL-RIGHT;  Surgeon: Eldred Grego, MD;  Location: [facility] ORS;  Service: General;  Laterality: Right;   INCISIONAL HERNIA REPAIR N/A 05/08/2018   Procedure: LAPAROSCOPIC VS. OPEN INCISIONAL HERNIA REPAIR WITH MESH;  Surgeon: Eldred Grego, MD;  Location: [facility] ORS;  Service: General;  Laterality: N/A;   LEFT HEART CATH AND CORONARY ANGIOGRAPHY Left 02/12/2010   Procedure: LEFT HEART CATH AND CORONARY ANGIOGRAPHY; Location: [facility]; Surgeon: Thomasene Flemings, MD   LESION REMOVAL Left 08/22/2021   Procedure: LESION REMOVAL (KNEE);  Surgeon: Jackquelyn Mass, MD;  Location: [facility] ORS;  Service: Vascular;  Laterality: Left;   RIGHT/LEFT HEART CATH AND CORONARY ANGIOGRAPHY Bilateral 06/13/2023   Procedure: RIGHT/LEFT HEART CATH AND CORONARY ANGIOGRAPHY;  Surgeon: Antonette Batters, MD;  Location: [facility] INVASIVE CV LAB;  Service: Cardiovascular;  Laterality: Bilateral;   THYROIDECTOMY Bilateral 11/17/2019   Procedure: TOTAL THYROIDECTOMY;  Surgeon: Mellody Sprout, MD;  Location: [facility] ORS;  Service: ENT;  Laterality: Bilateral;   TOTAL ABDOMINAL HYSTERECTOMY N/A    VEIN SURGERY     Endovenous ablation of saphenous vein    Allergies: Cefdinir, Pregabalin, and Sulfa antibiotics  Medications: Prior to Admission medications   Medication Sig Start Date End Date Taking? Authorizing Provider  acetaminophen  (TYLENOL ) 500 MG tablet Take 500 mg by  mouth every 6 (six) hours as needed (pain.). Patient not taking: Reported on 07/04/2023    [provider]  albuterol  (PROVENTIL ) (2.5 MG/3ML) 0.083% nebulizer solution Take 2.5 mg by nebulization in the morning and at bedtime.    [provider]  albuterol  (VENTOLIN  HFA) 108 (90 Base) MCG/ACT inhaler Inhale 2 puffs into the lungs 2 (two) times daily. 01/17/21   [provider]  Calcium Citrate-Vitamin D (CALCIUM CITRATE + D3 PO) Take 1 tablet by mouth in the morning and at bedtime.    [provider]  Cyanocobalamin (VITAMIN B 12 PO) Take 2,500 mcg by mouth in the morning.    [provider]  DULoxetine  (CYMBALTA ) 20 MG capsule Take 40 mg by mouth in the morning. 04/14/19   [provider]  furosemide  (LASIX ) 20 MG tablet Take 20 mg by mouth in the morning. 04/02/23   [provider]  HYDROcodone -acetaminophen  (NORCO/VICODIN) 5-325 MG tablet Take 0.5 tablets by mouth every 6 (six) hours as needed for moderate pain (pain score 4-6). 07/02/23   [provider]  isosorbide mononitrate (IMDUR) 60 MG 24 hr tablet Take 60 mg by mouth at bedtime.  [provider]  levothyroxine  (SYNTHROID ) 112 MCG tablet Take 112 mcg by mouth every other day. TAKE ONE TABLET EVERY OTHER DAY, ALTERNATING WITH 125 MCG DOSE 08/18/20   [provider]  levothyroxine  (SYNTHROID ) 125 MCG tablet Take 125 mcg by mouth every other day. TAKE ONE TABLET EVERY OTHER DAY, ALTERNATING WITH 112 MCG DOSE 01/29/21   [provider]  liothyronine  (CYTOMEL ) 5 MCG tablet Take 5 mcg by mouth daily before breakfast. 01/29/22   [provider]  Magnesium  250 MG TABS Take 200 mg by mouth every evening.    [provider]  metoprolol tartrate (LOPRESSOR) 25 MG tablet Take 25 mg by mouth 2 (two) times daily.    [provider]  montelukast (SINGULAIR) 10 MG tablet Take 10 mg by mouth at bedtime. 11/29/22   [provider]   Multiple Vitamin (MULTIVITAMIN WITH MINERALS) TABS tablet Take 1 tablet by mouth every evening.    [provider]  OZEMPIC, 0.25 OR 0.5 MG/DOSE, 2 MG/3ML SOPN Inject 0.25 mg into the skin every Monday. 11/09/21   [provider]  pantoprazole  (PROTONIX ) 40 MG tablet Take 40 mg by mouth daily before breakfast. 04/14/19   [provider]  predniSONE  (DELTASONE ) 5 MG tablet Take 5 mg by mouth in the morning.    [provider]  QUEtiapine (SEROQUEL) 25 MG tablet Take 25 mg by mouth at bedtime.    [provider]  rOPINIRole  (REQUIP ) 2 MG tablet Take 2 mg by mouth in the morning, at noon, and at bedtime.    [provider]  spironolactone (ALDACTONE) 25 MG tablet Take 12.5 mg by mouth every evening.    [provider]  traMADol  (ULTRAM ) 50 MG tablet Take 50 mg by mouth daily as needed (pain.).    [provider]  TRELEGY ELLIPTA 100-62.5-25 MCG/ACT AEPB Inhale 1 puff into the lungs daily.    [provider]  Turmeric 500 MG CAPS Take 500 mg by mouth every evening.    [provider]  Vibegron  (GEMTESA ) 75 MG TABS Take 1 tablet (75 mg total) by mouth daily. 03/25/23   Vaillancourt, Samantha, PA-C     Family History  Problem Relation Age of Onset   Heart Problems Mother    Heart Problems Father    Heart attack Father    Diabetes Brother    Diabetes Brother     Social History   Socioeconomic History   Marital status: Single    Spouse name: Not on file   Number of children: Not on file   Years of education: Not on file   Highest education level: Not on file  Occupational History   Occupation: retired  Tobacco Use   Smoking status: Never    Passive exposure: Never   Smokeless tobacco: Never  Vaping Use   Vaping status: Never Used  Substance and Sexual Activity   Alcohol use: No   Drug use: No   Sexual activity: Not on file  Other Topics Concern   Not on file  Social History Narrative   Lives  at home independently. Has a cane to ambulate occasionally. Continues to drive.   Social Drivers of Corporate investment banker Strain: Low Risk  (04/22/2023)   Received from [facility]   Overall Financial Resource Strain (CARDIA)    Difficulty of Paying Living Expenses: Not hard at all  Food Insecurity: No Food Insecurity (07/04/2023)   Hunger Vital Sign  Worried About Programme researcher, broadcasting/film/video in the Last Year: Never true    Ran Out of Food in the Last Year: Never true  Transportation Needs: No Transportation Needs (07/04/2023)   PRAPARE - Administrator, Civil Service (Medical): No    Lack of Transportation (Non-Medical): No  Physical Activity: Not on file  Stress: Not on file  Social Connections: Socially Isolated (07/04/2023)   Social Connection and Isolation Panel [NHANES]    Frequency of Communication with Friends and Family: More than three times a week    Frequency of Social Gatherings with Friends and Family: More than three times a week    Attends Religious Services: Never    Database administrator or Organizations: No    Attends Banker Meetings: Never    Marital Status: Widowed     Review of Systems: A 12 point ROS discussed and pertinent positives are indicated in the HPI above.  All other systems are negative.  Vital Signs: BP 116/60   Pulse 70   Temp 97.7 F (36.5 C) (Oral)   SpO2 93%   Advance Care Plan: The advanced care place/surrogate decision maker was discussed at the time of visit and the patient did not wish to discuss or was not able to name a surrogate decision maker or provide an advance care plan.  Physical Exam Constitutional:      General: She is not in acute distress.    Appearance: Normal appearance.  HENT:     Mouth/Throat:     Mouth: Mucous membranes are moist.  Cardiovascular:     Rate and Rhythm: Normal rate and regular rhythm.     Pulses: Normal pulses.     Heart sounds: Murmur heard.  Pulmonary:      Effort: Pulmonary effort is normal.     Breath sounds: Rales present.     Comments: Rales/ ronchi at bilateral bases. Musculoskeletal:        General: Normal range of motion.     Cervical back: Normal range of motion.  Skin:    General: Skin is warm and dry.  Neurological:     Mental Status: She is alert and oriented to person, place, and time.  Psychiatric:        Mood and Affect: Mood normal.        Behavior: Behavior normal.        Thought Content: Thought content normal.        Judgment: Judgment normal.     Imaging: CT CHEST ABDOMEN PELVIS W CONTRAST Result Date: 07/04/2023 CLINICAL DATA:  Metastatic disease evaluation. Tumor involving T6-T9. * Tracking Code: BO * EXAM: CT CHEST, ABDOMEN, AND PELVIS WITH CONTRAST TECHNIQUE: Multidetector CT imaging of the chest, abdomen and pelvis was performed following the standard protocol during bolus administration of intravenous contrast. RADIATION DOSE REDUCTION: This exam was performed according to the departmental dose-optimization program which includes automated exposure control, adjustment of the mA and/or kV according to patient size and/or use of iterative reconstruction technique. CONTRAST:  75mL OMNIPAQUE  IOHEXOL  300 MG/ML  SOLN COMPARISON:  MRI thoracic spine dated 06/16/2023, CT abdomen and pelvis dated 06/05/2023, CTA chest dated 04/28/2020 FINDINGS: CT CHEST FINDINGS Cardiovascular: Normal heart size. No significant pericardial fluid/thickening. Great vessels are normal in course and caliber. No central pulmonary emboli. Coronary artery calcifications. Mediastinum/Nodes: Small hiatal hernia. No pathologically enlarged axillary, supraclavicular, mediastinal, or hilar lymph nodes. Lungs/Pleura: The central airways are patent. Unchanged 4 x 4 mm right apical  nodule (3:28). 2 mm bilateral lower lobe nodules (3:55, 61), not definitely seen on prior examination. No focal consolidation. No pneumothorax. No pleural effusion. Musculoskeletal:  Similar severe compression of T7 with surrounding paraspinal soft tissue thickening, better evaluated on prior MRI. Bilateral bursa at the shoulders, left greater than right. Bilateral soft tissue densities deep to the scapula, likely benign elastofibroma dorsi. CT ABDOMEN PELVIS FINDINGS Hepatobiliary: Multifocal calcified foci along the periphery of liver. No intra or extrahepatic biliary ductal dilation. Normal gallbladder. Pancreas: No focal lesions or main ductal dilation. Spleen: Normal in size without focal abnormality. Adrenals/Urinary Tract: No adrenal nodules. No suspicious renal masses. Punctate nonobstructing right renal stones. No hydronephrosis. No focal bladder wall thickening. Stomach/Bowel: Normal appearance of the stomach. No evidence of bowel wall thickening, distention, or inflammatory changes. Colonic diverticulosis without acute diverticulitis. Appendix is not discretely seen. Vascular/Lymphatic: Aortic atherosclerosis. No enlarged abdominal or pelvic lymph nodes. Reproductive: No adnexal masses. Other: No free fluid, fluid collection, or free air. Musculoskeletal: No acute or abnormal lytic or blastic osseous findings. Multilevel degenerative changes of the partially imaged thoracic and lumbar spine. Bilateral L5 pars interarticularis defects without listhesis. IMPRESSION: 1. Similar severe compression of T7 with surrounding paraspinal soft tissue thickening, better evaluated on prior MRI. 2. Unchanged 4 mm right apical nodule, likely benign. 2 mm bilateral lower lobe nodules, not definitely seen on prior examination, indeterminate. 3. No evidence of metastatic disease in the abdomen or pelvis. 4. Aortic Atherosclerosis (ICD10-I70.0). Coronary artery calcifications. Assessment for potential risk factor modification, dietary therapy or pharmacologic therapy may be warranted, if clinically indicated. Electronically Signed   By: Limin  Xu M.D.   On: 07/04/2023 16:40   DG Chest 2 View Result  Date: 07/04/2023 CLINICAL DATA:  Shortness of breath.  History of lymphoma EXAM: CHEST - 2 VIEW COMPARISON:  Chest x-ray 01/30/2021.  Thoracic spine MRI 06/16/2023 FINDINGS: Underinflation. Normal cardiopericardial silhouette. Calcified aorta. There is some linear opacity at the left lung base likely scar or atelectasis. Bronchovascular crowding identified. Chronic interstitial lung changes are again seen. There is compression deformity with some kyphosis along the midthoracic spine corresponding to the finding by MRI of a T7 compression injury. Please correlate with prior workup IMPRESSION: Underinflation. Left basilar scar atelectasis. Chronic lung changes. Kyphosis with compression deformity along the midthoracic spine as previously described by MRI Electronically Signed   By: Adrianna Horde M.D.   On: 07/04/2023 09:58   MR THORACIC SPINE W WO CONTRAST Result Date: 07/02/2023 CLINICAL DATA:  T7 compression fracture. Worsening chronic pain. History of lymphoma. EXAM: MRI THORACIC WITHOUT AND WITH CONTRAST TECHNIQUE: Multiplanar and multiecho pulse sequences of the thoracic spine were obtained without and with intravenous contrast. CONTRAST:  7mL GADAVIST  GADOBUTROL  1 MMOL/ML IV SOLN COMPARISON:  CT abdomen and pelvis 06/05/2023 FINDINGS: Alignment: Mild thoracic dextroscoliosis. Exaggerated thoracic kyphosis. Trace anterolisthesis of C4 on C5, C6 on C7, C7 on T1, T1 on T2, and L2 on L3. Vertebrae: Abnormal T1 hypointensity, STIR hyperintensity, and enhancement throughout much of the T6-T9 vertebral bodies and T7-T9 posterior elements. Pathologic T7 compression fracture with 75% vertebral body height loss. Homogeneously enhancing epidural tumor located predominantly laterally and dorsally from T6-T10, greatest at T7 and T8 where there is resultant moderate spinal stenosis. Tumor infiltrates the T7-8 and T8-9 neural foramina bilaterally. Paraspinal tumor from T6-T9 is greatest on the right at T8, measuring up to 1.7  cm in thickness. Cord: Mild spinal cord mass effect at T7 related to epidural tumor and stenosis.  No cord edema. Paraspinal and other soft tissues: Paraspinal tumor as described above. Disc levels: Mild thoracic disc degeneration primarily in the midthoracic spine. Small noncompressive right paracentral disc protrusions at T8-9 and T9-10. More advanced disc degeneration at C5-6 and L1-2, not evaluated in detail on this thoracic spine study. Findings discussed by telephone with Dr. Jodeen Munch on 07/02/2023 at 4:30 p.m. IMPRESSION: Tumor involving the T6-T9 vertebrae and paraspinal soft tissues with associated severe pathologic T7 compression fracture and epidural tumor resulting in moderate spinal stenosis. Electronically Signed   By: Aundra Lee M.D.   On: 07/02/2023 17:00    Labs:  CBC: Recent Labs    06/13/23 1256 06/13/23 1257 07/04/23 0900 07/14/23 1226  WBC  --   --  7.2 7.3  HGB 12.2 11.9* 12.6 12.3  HCT 36.0 35.0* 38.1 37.5  PLT  --   --  226 237    COAGS: Recent Labs    07/14/23 1226  INR 1.1    BMP: Recent Labs    06/13/23 1256 06/13/23 1257 07/04/23 0900 07/04/23 0941  NA 137 137 SPECIMEN CONTAMINATED, UNABLE TO PERFORM TEST(S). 135  K 3.8 3.8 SPECIMEN CONTAMINATED, UNABLE TO PERFORM TEST(S). 3.7  CL  --   --  SPECIMEN CONTAMINATED, UNABLE TO PERFORM TEST(S). 96*  CO2  --   --  SPECIMEN CONTAMINATED, UNABLE TO PERFORM TEST(S). 29  GLUCOSE  --   --  SPECIMEN CONTAMINATED, UNABLE TO PERFORM TEST(S). 89  BUN  --   --  SPECIMEN CONTAMINATED, UNABLE TO PERFORM TEST(S). 33*  CALCIUM  --   --  SPECIMEN CONTAMINATED, UNABLE TO PERFORM TEST(S). 9.1  CREATININE  --   --  SPECIMEN CONTAMINATED, UNABLE TO PERFORM TEST(S). 1.35*  GFRNONAA  --   --  SPECIMEN CONTAMINATED, UNABLE TO PERFORM TEST(S). 38*    LIVER FUNCTION TESTS: No results for input(s): "BILITOT", "AST", "ALT", "ALKPHOS", "PROT", "ALBUMIN" in the last 8760 hours.  TUMOR MARKERS: No results for  input(s): "AFPTM", "CEA", "CA199", "CHROMGRNA" in the last 8760 hours.  Assessment and Plan: Per Dr. Loann Rily consult note on 5/9: "[Patient was sent] to [facility] ED on 07/04/2023 with concern for spinal tumor.    [...] MRI thoracic spine done on 06/16/2023 showed homogenously enhancing epidural tumor from T6-T10, greatest at T7 and T8 causing moderate spinal stenosis.  Pathologic T7 compression fracture.  Mild spinal cord mass effect at T7 related to epidural tumor.   Neurosurgery is on board and will be planning for biopsy."  Patient presents for scheduled right transpedicular biopsy of the T8 vertebral body in IR today.  Patient has been NPO since midnight.  All labs and medications are within acceptable parameters.  No pertinent allergies.    Risks and benefits of thoracic vertebral bone biopsy was discussed with the patient and/or patient's family including, but not limited to bleeding, infection, damage to adjacent structures or low yield requiring additional tests.  All of the questions were answered and there is agreement to proceed.  Consent signed and in chart.     Thank you for allowing our service to participate in JATORIA KNEELAND 's care.  Electronically Signed: Lovena Rubinstein, PA-C   07/14/2023, 1:05 PM      I spent a total of 30 Minutes in face to face in clinical consultation, greater than 50% of which was counseling/coordinating care for epidural tumor from T6 to T10, with consideration for biopsy.

## 2023-07-14 ENCOUNTER — Ambulatory Visit
Admission: RE | Admit: 2023-07-14 | Discharge: 2023-07-14 | Disposition: A | Discharge status: Home / Self Care | Source: Ambulatory Visit | Attending: Internal Medicine | Admitting: Internal Medicine

## 2023-07-14 ENCOUNTER — Other Ambulatory Visit: Payer: Self-pay

## 2023-07-14 ENCOUNTER — Encounter: Payer: Self-pay | Admitting: Radiology

## 2023-07-14 DIAGNOSIS — G96198 Other disorders of meninges, not elsewhere classified: Secondary | ICD-10-CM | POA: Insufficient documentation

## 2023-07-14 DIAGNOSIS — G9589 Other specified diseases of spinal cord: Secondary | ICD-10-CM

## 2023-07-14 DIAGNOSIS — M899 Disorder of bone, unspecified: Secondary | ICD-10-CM | POA: Insufficient documentation

## 2023-07-14 HISTORY — PX: IR CT BONE TROCAR/NEEDLE BIOPSY DEEP: IMG941

## 2023-07-14 LAB — BASIC METABOLIC PANEL WITH GFR
Anion gap: 12 (ref 5–15)
BUN: 36 mg/dL — ABNORMAL HIGH (ref 8–23)
CO2: 26 mmol/L (ref 22–32)
Calcium: 9 mg/dL (ref 8.9–10.3)
Chloride: 97 mmol/L — ABNORMAL LOW (ref 98–111)
Creatinine, Ser: 1.35 mg/dL — ABNORMAL HIGH (ref 0.44–1.00)
GFR, Estimated: 38 mL/min — ABNORMAL LOW (ref >60)
Glucose, Bld: 95 mg/dL (ref 70–99)
Potassium: 3.5 mmol/L (ref 3.5–5.1)
Sodium: 135 mmol/L (ref 135–145)

## 2023-07-14 LAB — CBC
HCT: 37.5 % (ref 36.0–46.0)
Hemoglobin: 12.3 g/dL (ref 12.0–15.0)
MCH: 30.7 pg (ref 26.0–34.0)
MCHC: 32.8 g/dL (ref 30.0–36.0)
MCV: 93.5 fL (ref 80.0–100.0)
Platelets: 237 10*3/uL (ref 150–400)
RBC: 4.01 MIL/uL (ref 3.87–5.11)
RDW: 15.3 % (ref 11.5–15.5)
WBC: 7.3 10*3/uL (ref 4.0–10.5)
nRBC: 0 % (ref 0.0–0.2)

## 2023-07-14 LAB — PROTIME-INR
INR: 1.1 (ref 0.8–1.2)
Prothrombin Time: 14 s (ref 11.4–15.2)

## 2023-07-14 MED ORDER — FENTANYL CITRATE (PF) 100 MCG/2ML IJ SOLN
INTRAMUSCULAR | Status: AC | PRN
  Administered 2023-07-14 (×2): 25 ug via INTRAVENOUS

## 2023-07-14 MED ORDER — LIDOCAINE HCL (PF) 1 % IJ SOLN
10.0000 mL | Freq: Once | INTRAMUSCULAR | Status: AC
Start: 2023-07-14 — End: 2023-07-14
  Administered 2023-07-14: 10 mL via INTRADERMAL

## 2023-07-14 MED ORDER — FENTANYL CITRATE (PF) 100 MCG/2ML IJ SOLN
INTRAMUSCULAR | Status: AC
  Filled 2023-07-14: qty 2

## 2023-07-14 MED ORDER — MIDAZOLAM HCL 2 MG/2ML IJ SOLN
INTRAMUSCULAR | Status: AC
  Filled 2023-07-14: qty 2

## 2023-07-14 MED ORDER — SODIUM CHLORIDE 0.9 % IV SOLN: INTRAVENOUS | Status: DC

## 2023-07-14 MED ORDER — LIDOCAINE HCL (PF) 1 % IJ SOLN
INTRAMUSCULAR | Status: AC
Start: 2023-07-14 — End: ?
  Filled 2023-07-14: qty 30

## 2023-07-14 MED ORDER — MIDAZOLAM HCL 2 MG/2ML IJ SOLN
INTRAMUSCULAR | Status: AC | PRN
  Administered 2023-07-14 (×2): .5 mg via INTRAVENOUS

## 2023-07-14 NOTE — Progress Notes (Signed)
 Patient clinically stable post IR bone biopsy per Dr Mabel Savage, tolerated well. Vitals table pre and post procedure. Received versed  1 mg along with Fentanyl  50 mcg Iv for procedure. Report given to [facility] RN post procedure./specials/15

## 2023-07-14 NOTE — Procedures (Signed)
 Interventional Radiology Procedure Note  Procedure:   Image guided T8 vertebral body biopsy.   Complications: None Specimen: To path in fresh specimen medium EBL: none  Recommendations:  - Ok to shower tomorrow - Do not submerge for 7 days - Routine wound care - 1 hr dc home - advance diet   Signed,  Marciano Settles. Mabel Savage, DO

## 2023-07-15 ENCOUNTER — Encounter (INDEPENDENT_AMBULATORY_CARE_PROVIDER_SITE_OTHER): Payer: Self-pay

## 2023-07-18 LAB — SURGICAL PATHOLOGY

## 2023-07-24 ENCOUNTER — Ambulatory Visit (INDEPENDENT_AMBULATORY_CARE_PROVIDER_SITE_OTHER): Admitting: Physician Assistant

## 2023-07-24 DIAGNOSIS — N3946 Mixed incontinence: Secondary | ICD-10-CM

## 2023-07-24 NOTE — Progress Notes (Signed)
 PTNS  Session # 35  Health & Social Factors: New possible spinal tumor, ?  Lymphoma recurrence Caffeine: 0 Alcohol: 0 Daytime voids #per day: 8 Night-time voids #per night: 8 Urgency: strong Incontinence Episodes #per day: 2/week Ankle used: right Treatment Setting: 19 Feeling/ Response: sensory Comments: Patient tolerated well  Performed By: Kathreen Pare, PA-C   Follow Up: 1 month

## 2023-07-28 ENCOUNTER — Inpatient Hospital Stay: Attending: Oncology | Admitting: Oncology

## 2023-07-28 ENCOUNTER — Other Ambulatory Visit: Payer: Self-pay | Admitting: Oncology

## 2023-07-28 ENCOUNTER — Encounter: Payer: Self-pay | Admitting: Oncology

## 2023-07-28 ENCOUNTER — Inpatient Hospital Stay

## 2023-07-28 VITALS — BP 105/63 | HR 80 | Temp 98.3°F | Resp 18 | Wt 154.4 lb

## 2023-07-28 DIAGNOSIS — I2510 Atherosclerotic heart disease of native coronary artery without angina pectoris: Secondary | ICD-10-CM | POA: Insufficient documentation

## 2023-07-28 DIAGNOSIS — E041 Nontoxic single thyroid nodule: Secondary | ICD-10-CM | POA: Diagnosis not present

## 2023-07-28 DIAGNOSIS — G629 Polyneuropathy, unspecified: Secondary | ICD-10-CM | POA: Insufficient documentation

## 2023-07-28 DIAGNOSIS — N183 Chronic kidney disease, stage 3 unspecified: Secondary | ICD-10-CM | POA: Diagnosis not present

## 2023-07-28 DIAGNOSIS — Z5112 Encounter for antineoplastic immunotherapy: Secondary | ICD-10-CM | POA: Insufficient documentation

## 2023-07-28 DIAGNOSIS — N200 Calculus of kidney: Secondary | ICD-10-CM | POA: Insufficient documentation

## 2023-07-28 DIAGNOSIS — C8290 Follicular lymphoma, unspecified, unspecified site: Secondary | ICD-10-CM | POA: Insufficient documentation

## 2023-07-28 DIAGNOSIS — K861 Other chronic pancreatitis: Secondary | ICD-10-CM | POA: Insufficient documentation

## 2023-07-28 DIAGNOSIS — G893 Neoplasm related pain (acute) (chronic): Secondary | ICD-10-CM | POA: Insufficient documentation

## 2023-07-28 DIAGNOSIS — C8590 Non-Hodgkin lymphoma, unspecified, unspecified site: Secondary | ICD-10-CM

## 2023-07-28 DIAGNOSIS — R630 Anorexia: Secondary | ICD-10-CM | POA: Diagnosis not present

## 2023-07-28 DIAGNOSIS — M4804 Spinal stenosis, thoracic region: Secondary | ICD-10-CM | POA: Insufficient documentation

## 2023-07-28 DIAGNOSIS — I700 Atherosclerosis of aorta: Secondary | ICD-10-CM | POA: Insufficient documentation

## 2023-07-28 LAB — CBC WITH DIFFERENTIAL/PLATELET
Abs Immature Granulocytes: 0.04 K/uL (ref 0.00–0.07)
Basophils Absolute: 0 K/uL (ref 0.0–0.1)
Basophils Relative: 0 %
Eosinophils Absolute: 0.2 K/uL (ref 0.0–0.5)
Eosinophils Relative: 2 %
HCT: 38 % (ref 36.0–46.0)
Hemoglobin: 12.5 g/dL (ref 12.0–15.0)
Immature Granulocytes: 1 %
Lymphocytes Relative: 8 %
Lymphs Abs: 0.7 K/uL (ref 0.7–4.0)
MCH: 31 pg (ref 26.0–34.0)
MCHC: 32.9 g/dL (ref 30.0–36.0)
MCV: 94.3 fL (ref 80.0–100.0)
Monocytes Absolute: 0.5 K/uL (ref 0.1–1.0)
Monocytes Relative: 6 %
Neutro Abs: 7 K/uL (ref 1.7–7.7)
Neutrophils Relative %: 83 %
Platelets: 243 K/uL (ref 150–400)
RBC: 4.03 MIL/uL (ref 3.87–5.11)
RDW: 15.4 % (ref 11.5–15.5)
WBC: 8.4 K/uL (ref 4.0–10.5)
nRBC: 0 % (ref 0.0–0.2)

## 2023-07-28 LAB — HIV ANTIBODY (ROUTINE TESTING W REFLEX): HIV Screen 4th Generation wRfx: NONREACTIVE

## 2023-07-28 LAB — LACTATE DEHYDROGENASE: LDH: 215 U/L — ABNORMAL HIGH (ref 98–192)

## 2023-07-28 LAB — COMPREHENSIVE METABOLIC PANEL WITH GFR
ALT: 22 U/L (ref 0–44)
AST: 25 U/L (ref 15–41)
Albumin: 3.9 g/dL (ref 3.5–5.0)
Alkaline Phosphatase: 67 U/L (ref 38–126)
Anion gap: 12 (ref 5–15)
BUN: 39 mg/dL — ABNORMAL HIGH (ref 8–23)
CO2: 29 mmol/L (ref 22–32)
Calcium: 8.7 mg/dL — ABNORMAL LOW (ref 8.9–10.3)
Chloride: 95 mmol/L — ABNORMAL LOW (ref 98–111)
Creatinine, Ser: 1.36 mg/dL — ABNORMAL HIGH (ref 0.44–1.00)
GFR, Estimated: 37 mL/min — ABNORMAL LOW (ref >60)
Glucose, Bld: 113 mg/dL — ABNORMAL HIGH (ref 70–99)
Potassium: 4.1 mmol/L (ref 3.5–5.1)
Sodium: 136 mmol/L (ref 135–145)
Total Bilirubin: 0.9 mg/dL (ref 0.0–1.2)
Total Protein: 7.7 g/dL (ref 6.5–8.1)

## 2023-07-28 LAB — HEPATITIS PANEL, ACUTE
HCV Ab: NONREACTIVE
Hep A IgM: NONREACTIVE
Hep B C IgM: NONREACTIVE
Hepatitis B Surface Ag: NONREACTIVE

## 2023-07-28 MED ORDER — OXYCODONE HCL 5 MG PO TABS: 5.0000 mg | ORAL_TABLET | Freq: Four times a day (QID) | ORAL | 0 refills | Status: DC | PRN

## 2023-07-28 MED ORDER — DOCUSATE SODIUM 100 MG PO CAPS: 100.0000 mg | ORAL_CAPSULE | Freq: Two times a day (BID) | ORAL | 0 refills | Status: DC

## 2023-07-28 NOTE — Progress Notes (Signed)
 Hematology/Oncology Progress note [phone] [phone]      Patient Care Team: Tamara Baumgarten, MD as PCP - General (Internal Medicine) Tamara Forbes, MD as Consulting Physician (Oncology)  REFERRING PROVIDER: Dr. Kevan Erickson CHIEF COMPLAINTS/REASON FOR VISIT:  Follow up for lymphoma.  ASSESSMENT & PLAN:   Non-Hodgkin lymphoma (HCC) Biopsy pathology showed atypical lymphoid infiltrate consistent with B-cell lymphoproliferative disorder. I will try to obtain patient's medical records to clarify previous non-Hodgkin lymphoma diagnosis. Recommend PET scan and bone marrow biopsy for further evaluation. Given her age, frailty and other medical comorbidities, consider rituximab treatments, +/- radiation.  Plan will be finalized after reviewing biopsy results with the pathologist.  Orders Placed This Encounter  Procedures   IR BONE MARROW BIOPSY & ASPIRATION    Standing Status:   Future    Number of Occurrences:   1    Expected Date:   08/04/2023    Expiration Date:   07/27/2024    Reason for Exam (SYMPTOM  OR DIAGNOSIS REQUIRED):   lymphoma    Preferred Imaging Location?:   [facility]   NM PET Image Initial (PI) Skull Base To Thigh    Standing Status:   Future    Number of Occurrences:   1    Expected Date:   08/04/2023    Expiration Date:   07/27/2024    If indicated for the ordered procedure, I authorize the administration of a radiopharmaceutical per Radiology protocol:   Yes    Preferred imaging location?:   [facility]   Comprehensive metabolic panel with GFR    Standing Status:   Future    Number of Occurrences:   1    Expected Date:   07/28/2023    Expiration Date:   07/27/2024   CBC with Differential/Platelet    Standing Status:   Future    Number of Occurrences:   1    Expected Date:   07/28/2023    Expiration Date:   07/27/2024   Flow cytometry panel-leukemia/lymphoma work-up    Standing Status:   Future    Number of Occurrences:   1    Expected  Date:   07/28/2023    Expiration Date:   07/27/2024   Lactate dehydrogenase    Standing Status:   Future    Number of Occurrences:   1    Expected Date:   07/28/2023    Expiration Date:   07/27/2024   Hepatitis panel, acute    Standing Status:   Future    Number of Occurrences:   1    Expected Date:   07/28/2023    Expiration Date:   07/27/2024   HIV Antibody (routine testing w rflx)    Standing Status:   Future    Number of Occurrences:   1    Expected Date:   07/28/2023    Expiration Date:   07/27/2024   Follow-up after bone marrow biopsy and PET scan. All questions were answered. The patient knows to call the clinic with any problems, questions or concerns. We spent sufficient time to discuss many aspect of care, questions were answered to patient's satisfaction. A total of 45 minutes was spent on this visit.  With 10 minutes spent reviewing image findings, obtain and review pathology reports, 30 minutes counseling the patient on the diagnosis, goal of care, management of symptoms.  Additional 5 minutes was spent on answering patient's questions.   Tamara Forbes, MD, PhD [facility] Hematology Oncology 07/28/2023   HISTORY  OF PRESENTING ILLNESS:  [name] is a  89 y.o.  female with PMH listed below who was referred to me for evaluation of neck swelling, history of lymphoma. Patient reports remote history of lymphoma many years ago.  She recalls that she received chemotherapy at that time.  Followed up with Dr. Suezanne Erickson who retired from our cancer center.  She was recently seen by PCP and has complained about her concerns of bilateral neck swelling for a while. She could not specify when she noticed this change. She reports feeling anxious as her initial symptoms which led to lymphoma diagnosis was neck LN swelling.   obtained patient's previous records in EMR by Dr.Choski. Patient has history of non hodgkin's lymphoma and s/p chemotherapy treatment. Her pathology was not available to me.   #  12/08/2017 US  thryoid done which showed left mid thyroid  nodule, 2.2 cm x 2.1cm x 2.1cm.  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 Afirma GSC benign, ROM 4%.  Patient was previously  referred to endocrinology for further management, and appears that patient never established care there.   09/21/2019, patient was evaluated in emergency room for confusion.  Due to the long wait time, patient left without seeing physician in the ED.  Patient had chest x-ray and CT head without contrast along with lab work.  Chest x-ray was negative and CT was negative for acute abnormality.  UA reviewed you urinary tract infection.  Patient presents for additional lab work and during waiting, patient was found to be confused and talk to herself.  She was approached by nursing staff and escorted to examining room for evaluation.  Patient was seen by symptom management nurse practitioner [name].  Lab work also showed AKI with creatinine increased to 1.5. Patient was treated with supportive care with IV fluids nitrofurantoin  for 7 days for UTI.  Urine culture lateral positive for E. Coli  INTERVAL HISTORY [name] is a 89 y.o. female who has above history reviewed by me today presents for follow up visit for history of lymphoma.  Pathological compression, paraspinal soft tissue mass. Accompanied by daughter [name] who is also patient's power of attorney. Patient reports not feeling good.  She has constant abdominal bloating/hard belly. 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  Oncology History  Non-Hodgkin lymphoma (HCC)  06/05/2023 Imaging   CT abdomen pelvis with contrast showed Narrative & Impression  CLINICAL DATA:  Right-sided abdominal pain, history of non-Hodgkin lymphoma   EXAM: CT ABDOMEN AND PELVIS WITH CONTRAST   TECHNIQUE: Multidetector CT imaging of the abdomen and pelvis was performed using the standard protocol following bolus administration  of intravenous contrast.   RADIATION DOSE REDUCTION: This exam was performed according to the departmental dose-optimization program which includes automated exposure control, adjustment of the mA and/or kV according to patient size and/or use of iterative reconstruction technique.   CONTRAST:  80mL OMNIPAQUE  IOHEXOL  300 MG/ML  SOLN   COMPARISON:  04/28/2020   FINDINGS: Lower chest: No acute pleural or parenchymal lung disease.   Hepatobiliary: No focal liver abnormality is seen. No gallstones, gallbladder wall thickening, or biliary dilatation.   Pancreas: Unremarkable. No pancreatic ductal dilatation or surrounding inflammatory changes.   Spleen: Normal in size without focal abnormality.   Adrenals/Urinary Tract: Mild bilateral renal cortical atrophy. No urinary tract calculi or obstructive uropathy. The adrenals and bladder are unremarkable.   Stomach/Bowel: No bowel obstruction or ileus. Diverticulosis of the descending and sigmoid colon. No evidence of acute diverticulitis.  No bowel wall thickening or inflammatory change.   Vascular/Lymphatic: Aortic atherosclerosis. No enlarged abdominal or pelvic lymph nodes.   Reproductive: Status post hysterectomy. No adnexal masses.   Other: No free fluid or free intraperitoneal gas. No abdominal wall hernia.   Musculoskeletal: There is an age-indeterminate T7 compression deformity, with greater than 50% loss of height. Mild paraspinal soft tissue swelling at this location suggests acute to subacute fracture. While there is no significant retropulsion, there does appear to be soft tissue density causing significant central canal stenosis. If further evaluation is desired, MRI may be useful.   No other acute bony abnormalities. Severe spondylosis and facet hypertrophy within the lower lumbar spine, most pronounced at L4-5 with significant central canal stenosis. This is a chronic stable finding. Reconstructed images  demonstrate no additional findings.   IMPRESSION: 1. Likely acute to subacute T7 compression fracture, with greater than 50% loss of height. Prominent paraspinal soft tissue swelling is noted, as well as soft tissue density in the central canal likely contributing to significant central canal stenosis. Further evaluation with MRI may be useful. 2. Distal colonic diverticulosis without diverticulitis. 3.  Aortic Atherosclerosis (ICD10-I70.0).     06/16/2023 Imaging   MRI thoracic spine with and without contrast showed Tumor involving the T6-T9 vertebrae and paraspinal soft tissues with associated severe pathologic T7 compression fracture and epidural tumor resulting in moderate spinal stenosis.     07/04/2023 Initial Diagnosis   Non-Hodgkin lymphoma (HCC)  Patient reports remote history of non-Hodgkin lymphoma which was treated by Dr. Suezanne Erickson.  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  07/04/2023, patient presented to emergency room due to findings of abnormal CT findings. Paraspinal tumor involving T6/T9 vertebrae with associated severe compression fracture of the T7, moderate spinal stenosis.  Patient was seen by Dr. Aris Bel during the hospitalization.  Patient was recommended to proceed with biopsy by IR outpatient.  07/14/2023 status post IR biopsy of T8 vertebral body Pathology showed atypical lymphoid infiltrate consistent with B-cell lymphoproliferative disorder.Morphologic evaluation of the bone biopsy reveals a monotonous atypical lymphoid infiltrate comprised of small to medium lymphocytes with monocytoid appearance.  Immunohistochemically, the atypical cells are positive for CD20, PAX5, CD79a and BCL2.  CD3 and CD5 highlight background T lymphocytes. CD10 and BCL6 highlight residual  germinal centers.  CD138 and mum 1 highlight plasma cells.  TdT, CD34 and cyclin D1 are negative in the atypical cells.  CD43 appears to be positive in a subset of the atypical cells.  Kappa/lambda IHC are  noncontributory.  Overall the findings are consistent with a B-cell     lymphoproliferative disorder, particularly marginal zone lymphoma.  However amore definitive assessment is precluded by lack of clonality on  immunohistochemical staining and lack of separate tissue for flow cytometric analysis.  A lymphoma NGS panel will be performed and results will be reported in an addendum.      07/04/2023 Imaging   CT chest abdomen pelvis with contrast 1. Similar severe compression of T7 with surrounding paraspinal soft tissue thickening, better evaluated on prior MRI. 2. Unchanged 4 mm right apical nodule, likely benign. 2 mm bilateral lower lobe nodules, not definitely seen on prior examination, indeterminate. 3. No evidence of metastatic disease in the abdomen or pelvis. 4. Aortic Atherosclerosis (ICD10-I70.0). Coronary artery calcifications. Assessment for potential risk factor modification, dietary therapy or pharmacologic  therapy may be warranted, if clinically indicated.     07/28/2023 Cancer Staging   Staging form: Hodgkin and Non-Hodgkin Lymphoma, AJCC 8th Edition - Clinical: Unknown - Signed by Tamara Forbes, MD on 07/28/2023 Stage prefix: Recurrence   07/28/2023 Imaging   PET scan showed 1. Hypermetabolic lymphoma involving abdominal retroperitoneal and right pelvic lymph nodes as well as midthoracic spine (Deauville 5). 2. Hypermetabolic soft tissue thickening deep to the upper abdominal midline ventral wall, possibly due to lymphoma. 3. Punctate right renal stone. 4. Chronic calcific pancreatitis. 5. Aortic atherosclerosis (ICD10-I70.0). Coronary artery calcification.    Obtained previous medical records. She had left axillary lymph node excisional biopsy in February 2002.  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  04/24/2020, pulmonary biopsy showed involvement of malignant lymphoma. Patient received R-CHOP treatments and radiation.   Review of Systems  Constitutional:  Negative for chills, fever, malaise/fatigue and weight loss.  HENT:  Negative for sore throat.        Neck fullness  Eyes:  Negative for double vision, photophobia and redness.  Respiratory:  Negative for cough, shortness of breath and wheezing.   Cardiovascular:  Negative for chest pain and palpitations.  Gastrointestinal:  Negative for abdominal pain, blood in stool, nausea and vomiting.       Abd bloating  Genitourinary:  Negative for dysuria.  Musculoskeletal:  Negative for back pain, myalgias and neck pain.  Skin:  Negative for itching and rash.  Neurological:  Negative for dizziness, tingling and tremors.  Endo/Heme/Allergies:  Negative for environmental allergies. Does not bruise/bleed easily.  Psychiatric/Behavioral:  Negative for depression and hallucinations.     MEDICAL HISTORY:  Past Medical History:  Diagnosis Date   Afib (HCC)    a.) CHA2DS2-VASc = 6 (age x 2, sex, CHF, HTN, aortic plaque). b.) rate/rhythm maintained without pharmacological intervention; she is not on oral anticoagulation   Anxiety    Aortic atherosclerosis (HCC)    Arthritis    Basal cell carcinoma 11/11/2017   Right inferior knee. Superficial and nodular patterns.   Bilateral lower extremity edema    CKD (chronic kidney disease), stage III (HCC)    Congestive heart failure (CHF) (HCC) 05/31/2014   a.) TTE 05/31/2014: EF >55%, RVE; LAE; triv panvalvular regurg; G1DD. b.) TTE 06/29/2017: EF >55%; RVE; BAE; G1DD. c.) TTE 05/09/2018: EF 55-60%; LAE; mod MR, mild AR/TR. d.) TTE 04/29/2020: EF 60-65%; mi;d-mod MR/TR; RVSP 36.6 mmHg. e.) TTE 08/24/2020: EF >55%; mild LVH; mild MR/TR/PR; sev MAC.   COPD (chronic obstructive pulmonary disease) (HCC)    Coronary artery  disease 06/03/2008   a.) LHC 06/03/2008: 40% pLAD-1, 60% pLAD-2, 90% mLAD-1, 30% mLAD-2, 50% mLAD-3 --> PCI placing a 2.0 x 12 mm Mini-Vision BMS x 1 to mLAD-1 lesion. b.) LHC 02/12/10: 50% pLAD, 30% ISR mLAD, 25% dLAD, 25% pLCx, 30% mLCx, 20% pRCA, 20% D1; further intervention deferred opting for medical mgmt.   DDD (degenerative disc disease), lumbar    Depression    GERD (gastroesophageal reflux disease)    HOH (hard of hearing)    a.) uses BILATERAL hearing aids   Hurthle cell neoplasm of thyroid     a.) s/p total thyroidectomy 11/17/2019   Hyperlipemia    Hypertension    Hypothyroidism    Lumbar spinal stenosis    Memory loss    Non Hodgkin's lymphoma (HCC) 2002   Squamous cell carcinoma of skin 06/13/2020   L mid pretibia, biopsy only  Urinary incontinence     SURGICAL HISTORY: Past Surgical History:  Procedure Laterality Date   CORONARY ANGIOPLASTY WITH STENT PLACEMENT Left 06/03/2008   Procedure: CORONARY ANGIOPLASTY WITH STENT PLACEMENT; Location: [facility]; Surgeon(s): Thomasene Flemings, MD (diagnostic) and Thais Fill, MD (interventional)   EXCISION OF ABDOMINAL WALL TUMOR Right 05/08/2018   Procedure: EXCISION OF ABDOMINAL WALL-RIGHT;  Surgeon: Eldred Grego, MD;  Location: [facility];  Service: General;  Laterality: Right;   INCISIONAL HERNIA REPAIR N/A 05/08/2018   Procedure: LAPAROSCOPIC VS. OPEN INCISIONAL HERNIA REPAIR WITH MESH;  Surgeon: Eldred Grego, MD;  Location: [facility];  Service: General;  Laterality: N/A;   IR CT BONE TROCAR/NEEDLE BIOPSY DEEP  07/14/2023   LEFT HEART CATH AND CORONARY ANGIOGRAPHY Left 02/12/2010   Procedure: LEFT HEART CATH AND CORONARY ANGIOGRAPHY; Location: [facility]; Surgeon: Thomasene Flemings, MD   LESION REMOVAL Left 08/22/2021   Procedure: LESION REMOVAL (KNEE);  Surgeon: Jackquelyn Mass, MD;  Location: [facility];  Service: Vascular;  Laterality: Left;   RIGHT/LEFT HEART CATH AND CORONARY ANGIOGRAPHY Bilateral 06/13/2023    Procedure: RIGHT/LEFT HEART CATH AND CORONARY ANGIOGRAPHY;  Surgeon: Antonette Batters, MD;  Location: [facility] INVASIVE CV [facility];  Service: Cardiovascular;  Laterality: Bilateral;   THYROIDECTOMY Bilateral 11/17/2019   Procedure: TOTAL THYROIDECTOMY;  Surgeon: Mellody Sprout, MD;  Location: [facility];  Service: ENT;  Laterality: Bilateral;   TOTAL ABDOMINAL HYSTERECTOMY N/A    VEIN SURGERY     Endovenous ablation of saphenous vein    SOCIAL HISTORY: Social History   Socioeconomic History   Marital status: Single    Spouse name: Not on file   Number of children: Not on file   Years of education: Not on file   Highest education level: Not on file  Occupational History   Occupation: retired  Tobacco Use   Smoking status: Never    Passive exposure: Never   Smokeless tobacco: Never  Vaping Use   Vaping status: Never Used  Substance and Sexual Activity   Alcohol use: No   Drug use: No   Sexual activity: Not on file  Other Topics Concern   Not on file  Social History Narrative   Lives at home independently. Has a cane to ambulate occasionally. Continues to drive.   Social Drivers of Corporate investment banker Strain: Low Risk  (04/22/2023)   Received from [facility]   Overall Financial Resource Strain (CARDIA)    Difficulty of Paying Living Expenses: Not hard at all  Food Insecurity: No Food Insecurity (07/04/2023)   Hunger Vital Sign    Worried About Running Out of Food in the Last Year: Never true    Ran Out of Food in the Last Year: Never true  Transportation Needs: No Transportation Needs (07/04/2023)   PRAPARE - Administrator, Civil Service (Medical): No    Lack of Transportation (Non-Medical): No  Physical Activity: Not on file  Stress: Not on file  Social Connections: Socially Isolated (07/04/2023)   Social Connection and Isolation Panel [NHANES]    Frequency of Communication with Friends and Family: More than three times a week    Frequency of  Social Gatherings with Friends and Family: More than three times a week    Attends Religious Services: Never    Database administrator or Organizations: No    Attends Banker Meetings: Never    Marital Status: Widowed  Intimate Partner Violence: Not At Risk (07/04/2023)   Humiliation, Afraid,  Rape, and Kick questionnaire    Fear of Current or Ex-Partner: No    Emotionally Abused: No    Physically Abused: No    Sexually Abused: No    FAMILY HISTORY: Family History  Problem Relation Age of Onset   Heart Problems Mother    Heart Problems Father    Heart attack Father    Diabetes Brother    Diabetes Brother     ALLERGIES:  is allergic to cefdinir, pregabalin, and sulfa antibiotics.  MEDICATIONS:  Current Outpatient Medications  Medication Sig Dispense Refill   albuterol  (PROVENTIL ) (2.5 MG/3ML) 0.083% nebulizer solution Take 2.5 mg by nebulization in the morning and at bedtime.     albuterol  (VENTOLIN  HFA) 108 (90 Base) MCG/ACT inhaler Inhale 2 puffs into the lungs 2 (two) times daily.     Calcium Citrate-Vitamin D (CALCIUM CITRATE + D3 PO) Take 1 tablet by mouth in the morning and at bedtime.     Cyanocobalamin (VITAMIN B 12 PO) Take 2,500 mcg by mouth in the morning.     docusate sodium  (COLACE) 100 MG capsule Take 1 capsule (100 mg total) by mouth 2 (two) times daily. 60 capsule 0   DULoxetine  (CYMBALTA ) 20 MG capsule Take 40 mg by mouth in the morning.     furosemide  (LASIX ) 20 MG tablet Take 20 mg by mouth in the morning.     isosorbide mononitrate (IMDUR) 60 MG 24 hr tablet Take 60 mg by mouth at bedtime.     levothyroxine  (SYNTHROID ) 112 MCG tablet Take 112 mcg by mouth every other day. TAKE ONE TABLET EVERY OTHER DAY, ALTERNATING WITH 125 MCG DOSE     levothyroxine  (SYNTHROID ) 125 MCG tablet Take 125 mcg by mouth every other day. TAKE ONE TABLET EVERY OTHER DAY, ALTERNATING WITH 112 MCG DOSE     liothyronine  (CYTOMEL ) 5 MCG tablet Take 5 mcg by mouth daily before  breakfast.     Magnesium  250 MG TABS Take 200 mg by mouth every evening.     metoprolol tartrate (LOPRESSOR) 25 MG tablet Take 25 mg by mouth 2 (two) times daily.     montelukast (SINGULAIR) 10 MG tablet Take 10 mg by mouth at bedtime.     Multiple Vitamin (MULTIVITAMIN WITH MINERALS) TABS tablet Take 1 tablet by mouth every evening.     oxyCODONE  (OXY IR/ROXICODONE ) 5 MG immediate release tablet Take 1 tablet (5 mg total) by mouth every 6 (six) hours as needed for severe pain (pain score 7-10). 30 tablet 0   OZEMPIC, 0.25 OR 0.5 MG/DOSE, 2 MG/3ML SOPN Inject 0.25 mg into the skin every Monday.     pantoprazole  (PROTONIX ) 40 MG tablet Take 40 mg by mouth daily before breakfast.     predniSONE  (DELTASONE ) 5 MG tablet Take 5 mg by mouth in the morning.     QUEtiapine (SEROQUEL) 25 MG tablet Take 25 mg by mouth at bedtime.     rOPINIRole  (REQUIP ) 2 MG tablet Take 2 mg by mouth in the morning, at noon, and at bedtime.     spironolactone (ALDACTONE) 25 MG tablet Take 12.5 mg by mouth every evening.     TRELEGY ELLIPTA 100-62.5-25 MCG/ACT AEPB Inhale 1 puff into the lungs daily.     Turmeric 500 MG CAPS Take 500 mg by mouth every evening.     Vibegron  (GEMTESA ) 75 MG TABS Take 1 tablet (75 mg total) by mouth daily. 90 tablet 3   acetaminophen  (TYLENOL ) 500 MG tablet Take 500 mg by  mouth every 6 (six) hours as needed (pain.). (Patient not taking: Reported on 07/04/2023)     No current facility-administered medications for this visit.     PHYSICAL EXAMINATION: ECOG PERFORMANCE STATUS: 1 - Symptomatic but completely ambulatory Vitals:   07/28/23 1408  BP: 105/63  Pulse: 80  Resp: 18  Temp: 98.3 F (36.8 C)  SpO2: 96%   Filed Weights   07/28/23 1408  Weight: 154 lb 6.4 oz (70 kg)    Physical Exam Constitutional:      General: She is not in acute distress.    Comments: Frail appearance female  HENT:     Head: Normocephalic and atraumatic.  Eyes:     General: No scleral icterus.     Pupils: Pupils are equal, round, and reactive to light.  Cardiovascular:     Rate and Rhythm: Normal rate and regular rhythm.     Heart sounds: Normal heart sounds.  Pulmonary:     Effort: Pulmonary effort is normal. No respiratory distress.     Breath sounds: No wheezing.  Abdominal:     General: Bowel sounds are normal.     Palpations: Abdomen is soft. There is no mass.     Tenderness: There is no abdominal tenderness.  Musculoskeletal:        General: No deformity. Normal range of motion.     Cervical back: Normal range of motion and neck supple.  Skin:    General: Skin is warm and dry.     Findings: No erythema or rash.  Neurological:     Mental Status: She is alert and oriented to person, place, and time. Mental status is at baseline.     Cranial Nerves: No cranial nerve deficit.  Psychiatric:        Mood and Affect: Mood normal.      LABORATORY DATA:  I have reviewed the data as listed Lab Results  Component Value Date   WBC 8.4 07/28/2023   HGB 12.5 07/28/2023   HCT 38.0 07/28/2023   MCV 94.3 07/28/2023   PLT 243 07/28/2023   Recent Labs    07/04/23 0941 07/14/23 1226 07/28/23 1459  NA 135 135 136  K 3.7 3.5 4.1  CL 96* 97* 95*  CO2 29 26 29   GLUCOSE 89 95 113*  BUN 33* 36* 39*  CREATININE 1.35* 1.35* 1.36*  CALCIUM 9.1 9.0 8.7*  GFRNONAA 38* 38* 37*  PROT  --   --  7.7  ALBUMIN  --   --  3.9  AST  --   --  25  ALT  --   --  22  ALKPHOS  --   --  67  BILITOT  --   --  0.9

## 2023-07-28 NOTE — Assessment & Plan Note (Addendum)
 Biopsy pathology showed atypical lymphoid infiltrate consistent with B-cell lymphoproliferative disorder. I will try to obtain patient's medical records to clarify previous non-Hodgkin lymphoma diagnosis. Recommend PET scan and bone marrow biopsy for further evaluation. Given her age, frailty and other medical comorbidities, consider rituximab treatments, +/- radiation.  Plan will be finalized after reviewing biopsy results with the pathologist.

## 2023-07-29 ENCOUNTER — Telehealth: Payer: Self-pay

## 2023-07-29 NOTE — Telephone Encounter (Signed)
 Pt scheduled for BM Bx on 6/5 @ 6/5 at 8:3a,  arrive 7:30a. Pt's daughter aware.

## 2023-07-30 ENCOUNTER — Other Ambulatory Visit: Payer: Self-pay | Admitting: Radiology

## 2023-07-30 ENCOUNTER — Other Ambulatory Visit: Payer: Self-pay

## 2023-07-30 ENCOUNTER — Encounter
Admission: RE | Admit: 2023-07-30 | Discharge: 2023-07-30 | Disposition: A | Discharge status: Home / Self Care | Source: Ambulatory Visit | Attending: Oncology | Admitting: Oncology

## 2023-07-30 ENCOUNTER — Emergency Department

## 2023-07-30 ENCOUNTER — Emergency Department
Admission: EM | Admit: 2023-07-30 | Discharge: 2023-07-30 | Disposition: A | Discharge status: Home / Self Care | Attending: Emergency Medicine | Admitting: Emergency Medicine

## 2023-07-30 DIAGNOSIS — I2510 Atherosclerotic heart disease of native coronary artery without angina pectoris: Secondary | ICD-10-CM | POA: Insufficient documentation

## 2023-07-30 DIAGNOSIS — C8590 Non-Hodgkin lymphoma, unspecified, unspecified site: Secondary | ICD-10-CM

## 2023-07-30 DIAGNOSIS — R0789 Other chest pain: Secondary | ICD-10-CM | POA: Insufficient documentation

## 2023-07-30 DIAGNOSIS — R079 Chest pain, unspecified: Secondary | ICD-10-CM

## 2023-07-30 DIAGNOSIS — N189 Chronic kidney disease, unspecified: Secondary | ICD-10-CM | POA: Diagnosis not present

## 2023-07-30 DIAGNOSIS — I509 Heart failure, unspecified: Secondary | ICD-10-CM | POA: Insufficient documentation

## 2023-07-30 DIAGNOSIS — R0600 Dyspnea, unspecified: Secondary | ICD-10-CM

## 2023-07-30 HISTORY — DX: Dyspnea, unspecified: R06.00

## 2023-07-30 HISTORY — DX: Chest pain, unspecified: R07.9

## 2023-07-30 LAB — BASIC METABOLIC PANEL WITH GFR
Anion gap: 11 (ref 5–15)
BUN: 34 mg/dL — ABNORMAL HIGH (ref 8–23)
CO2: 28 mmol/L (ref 22–32)
Calcium: 9.1 mg/dL (ref 8.9–10.3)
Chloride: 97 mmol/L — ABNORMAL LOW (ref 98–111)
Creatinine, Ser: 1.61 mg/dL — ABNORMAL HIGH (ref 0.44–1.00)
GFR, Estimated: 31 mL/min — ABNORMAL LOW (ref >60)
Glucose, Bld: 103 mg/dL — ABNORMAL HIGH (ref 70–99)
Potassium: 4.2 mmol/L (ref 3.5–5.1)
Sodium: 136 mmol/L (ref 135–145)

## 2023-07-30 LAB — COMP PANEL: LEUKEMIA/LYMPHOMA

## 2023-07-30 LAB — GLUCOSE, CAPILLARY
Glucose-Capillary: 93 mg/dL (ref 70–99)
Glucose-Capillary: 87 mg/dL (ref 70–99)

## 2023-07-30 LAB — CBC
HCT: 38.7 % (ref 36.0–46.0)
Hemoglobin: 12.8 g/dL (ref 12.0–15.0)
MCH: 31.4 pg (ref 26.0–34.0)
MCHC: 33.1 g/dL (ref 30.0–36.0)
MCV: 94.9 fL (ref 80.0–100.0)
Platelets: 225 10*3/uL (ref 150–400)
RBC: 4.08 MIL/uL (ref 3.87–5.11)
RDW: 15.4 % (ref 11.5–15.5)
WBC: 6.5 10*3/uL (ref 4.0–10.5)
nRBC: 0 % (ref 0.0–0.2)

## 2023-07-30 LAB — TROPONIN I (HIGH SENSITIVITY)
Troponin I (High Sensitivity): 7 ng/L (ref <18)
Troponin I (High Sensitivity): 7 ng/L (ref <18)

## 2023-07-30 MED ORDER — FLUDEOXYGLUCOSE F - 18 (FDG) INJECTION
8.0000 | Freq: Once | INTRAVENOUS | Status: AC | PRN
Start: 2023-07-30 — End: 2023-07-30
  Administered 2023-07-30: 8.15 via INTRAVENOUS

## 2023-07-30 NOTE — ED Provider Notes (Signed)
.-----------------------------------------   3:39 PM on 07/30/2023 -----------------------------------------  Blood pressure (!) 118/53, pulse 65, temperature 98.2 F (36.8 C), temperature source Oral, resp. rate 17, height 5' (1.524 m), weight 68 kg, SpO2 99%.  Assuming care from Dr. Demetrios Finders.  In short, [name] is a 89 y.o. female with a chief complaint of Chest Pain .  Refer to the original H&P for additional details.  The current plan of care is to follow-up repeat troponin, likely discharge.  On reassessment patient has not had any recurrence of her pain, shortness of breath or lightheadedness.  States that she would like to go home.  Shared decision making to patient and she is agreeable plan to follow-up with her primary care doctor, states that she has an appointment tomorrow morning.  Considered but no indication for inpatient admission at this time, she safe for outpatient management.  Will discharge with strict precautions.  Clinical Course as of 07/30/23 1735  Wed Jul 30, 2023  1732 Troponin I (High Sensitivity) Troponin x 2 is negative. [TT]    Clinical Course User Index [TT] Shane Darling, MD      Shane Darling, MD 07/30/23 [phone]

## 2023-07-30 NOTE — H&P (Shared)
 Chief Complaint: Patient was seen in consultation today for lymphoma, with consideration for bone marrow biopsy.  Referring Provider(s): Dr. Timmy Forbes, MD   Supervising Physician: Dr. Susan Ensign, MD  Patient Status: [name] - Out-pt  Patient is Full Code  History of Present Illness: [name] is a 89 y.o. female  with PMHx notable for, but not limited to: non-hodgkin's lymphoma, HTN, HLD, CHF, Afib, CAD, COPD, CKD, GERD, hypothyroidism, and further as discussed below.   Patient is know to IR service, having most recently undergone epidural tumor biopsy on 5/19 by Dr. Mabel Savage.  Per Dr. Jackqueline Mason progress note on 6/2: "History of lymphoma.  Labs are reviewed.  Increased LDH as well as uric acid. I recommend patient to continue allopurinol  200 mg daily. Abnormal LDH and uric acid may be secondary to infection, gout flare, versus recurrence of lymphoma. I will obtain a PET scan for further evaluation."  PET scan on 6/4: IMPRESSION: 1. Hypermetabolic lymphoma involving abdominal retroperitoneal and right pelvic lymph nodes as well as midthoracic spine (Deauville 5). 2. Hypermetabolic soft tissue thickening deep to the upper abdominal midline ventral wall, possibly due to lymphoma. 3. Punctate right renal stone. 4. Chronic calcific pancreatitis. 5. Aortic atherosclerosis (ICD10-I70.0). Coronary artery calcification.  Interventional Radiology was requested for bone marrow biopsy and aspiration. Patient is scheduled for same in IR today.   Patient is alert and laying in bed, calm. Daughter is at bedside. Patient is currently without any significant complaints.  Patient denies any fevers, headache, chest pain, SOB, cough, abdominal pain, nausea, vomiting or bleeding.     Past Medical History:  Diagnosis Date   Afib (HCC)    a.) CHA2DS2-VASc = 6 (age x 2, sex, CHF, HTN, aortic plaque). b.) rate/rhythm maintained without pharmacological intervention; she is not on oral  anticoagulation   Anxiety    Aortic atherosclerosis (HCC)    Arthritis    Basal cell carcinoma 11/11/2017   Right inferior knee. Superficial and nodular patterns.   Bilateral lower extremity edema    Chest pain 07/30/2023   CKD (chronic kidney disease), stage III (HCC)    Congestive heart failure (CHF) (HCC) 05/31/2014   a.) TTE 05/31/2014: EF >55%, RVE; LAE; triv panvalvular regurg; G1DD. b.) TTE 06/29/2017: EF >55%; RVE; BAE; G1DD. c.) TTE 05/09/2018: EF 55-60%; LAE; mod MR, mild AR/TR. d.) TTE 04/29/2020: EF 60-65%; mi;d-mod MR/TR; RVSP 36.6 mmHg. e.) TTE 08/24/2020: EF >55%; mild LVH; mild MR/TR/PR; sev MAC.   COPD (chronic obstructive pulmonary disease) (HCC)    Coronary artery disease 06/03/2008   a.) LHC 06/03/2008: 40% pLAD-1, 60% pLAD-2, 90% mLAD-1, 30% mLAD-2, 50% mLAD-3 --> PCI placing a 2.0 x 12 mm Mini-Vision BMS x 1 to mLAD-1 lesion. b.) LHC 02/12/10: 50% pLAD, 30% ISR mLAD, 25% dLAD, 25% pLCx, 30% mLCx, 20% pRCA, 20% D1; further intervention deferred opting for medical mgmt.   DDD (degenerative disc disease), lumbar    Depression    Dyspnea 07/30/2023   GERD (gastroesophageal reflux disease)    HOH (hard of hearing)    a.) uses BILATERAL hearing aids   Hurthle cell neoplasm of thyroid     a.) s/p total thyroidectomy 11/17/2019   Hyperlipemia    Hypertension    Hypothyroidism    Lumbar spinal stenosis    Memory loss    Non Hodgkin's lymphoma (HCC) 2002   Squamous cell carcinoma of skin 06/13/2020   L mid pretibia, biopsy only   Urinary incontinence  Past Surgical History:  Procedure Laterality Date   CORONARY ANGIOPLASTY WITH STENT PLACEMENT Left 06/03/2008   Procedure: CORONARY ANGIOPLASTY WITH STENT PLACEMENT; Location: [facility]; Surgeon(s): Thomasene Flemings, MD (diagnostic) and Thais Fill, MD (interventional)   EXCISION OF ABDOMINAL WALL TUMOR Right 05/08/2018   Procedure: EXCISION OF ABDOMINAL WALL-RIGHT;  Surgeon: Eldred Grego, MD;  Location: [facility]  ORS;  Service: General;  Laterality: Right;   INCISIONAL HERNIA REPAIR N/A 05/08/2018   Procedure: LAPAROSCOPIC VS. OPEN INCISIONAL HERNIA REPAIR WITH MESH;  Surgeon: Eldred Grego, MD;  Location: [facility] ORS;  Service: General;  Laterality: N/A;   IR CT BONE TROCAR/NEEDLE BIOPSY DEEP  07/14/2023   LEFT HEART CATH AND CORONARY ANGIOGRAPHY Left 02/12/2010   Procedure: LEFT HEART CATH AND CORONARY ANGIOGRAPHY; Location: [facility]; Surgeon: Thomasene Flemings, MD   LESION REMOVAL Left 08/22/2021   Procedure: LESION REMOVAL (KNEE);  Surgeon: Jackquelyn Mass, MD;  Location: [facility] ORS;  Service: Vascular;  Laterality: Left;   RIGHT/LEFT HEART CATH AND CORONARY ANGIOGRAPHY Bilateral 06/13/2023   Procedure: RIGHT/LEFT HEART CATH AND CORONARY ANGIOGRAPHY;  Surgeon: Antonette Batters, MD;  Location: [facility] INVASIVE CV LAB;  Service: Cardiovascular;  Laterality: Bilateral;   THYROIDECTOMY Bilateral 11/17/2019   Procedure: TOTAL THYROIDECTOMY;  Surgeon: Mellody Sprout, MD;  Location: [facility] ORS;  Service: ENT;  Laterality: Bilateral;   TOTAL ABDOMINAL HYSTERECTOMY N/A    VEIN SURGERY     Endovenous ablation of saphenous vein    Allergies: Cefdinir, Pregabalin, and Sulfa antibiotics  Medications: Prior to Admission medications   Medication Sig Start Date End Date Taking? Authorizing Provider  acetaminophen  (TYLENOL ) 500 MG tablet Take 500 mg by mouth every 6 (six) hours as needed (pain.). Patient not taking: Reported on 07/04/2023    [provider]  albuterol  (PROVENTIL ) (2.5 MG/3ML) 0.083% nebulizer solution Take 2.5 mg by nebulization in the morning and at bedtime.    [provider]  albuterol  (VENTOLIN  HFA) 108 (90 Base) MCG/ACT inhaler Inhale 2 puffs into the lungs 2 (two) times daily. 01/17/21   [provider]  Calcium Citrate-Vitamin D (CALCIUM CITRATE + D3 PO) Take 1 tablet by mouth in the morning and at bedtime.    [provider]  Cyanocobalamin (VITAMIN B 12 PO)  Take 2,500 mcg by mouth in the morning.    [provider]  docusate sodium (COLACE) 100 MG capsule TAKE 1 CAPSULE BY MOUTH TWICE A DAY 07/29/23   Timmy Forbes, MD  DULoxetine  (CYMBALTA ) 20 MG capsule Take 40 mg by mouth in the morning. 04/14/19   [provider]  furosemide  (LASIX ) 20 MG tablet Take 20 mg by mouth in the morning. 04/02/23   [provider]  isosorbide mononitrate (IMDUR) 60 MG 24 hr tablet Take 60 mg by mouth at bedtime.    [provider]  levothyroxine  (SYNTHROID ) 112 MCG tablet Take 112 mcg by mouth every other day. TAKE ONE TABLET EVERY OTHER DAY, ALTERNATING WITH 125 MCG DOSE 08/18/20   [provider]  levothyroxine  (SYNTHROID ) 125 MCG tablet Take 125 mcg by mouth every other day. TAKE ONE TABLET EVERY OTHER DAY, ALTERNATING WITH 112 MCG DOSE 01/29/21   [provider]  liothyronine  (CYTOMEL ) 5 MCG tablet Take 5 mcg by mouth daily before breakfast. 01/29/22   [provider]  Magnesium  250 MG TABS Take 200 mg by mouth every evening.    [provider]  metoprolol tartrate (LOPRESSOR) 25 MG tablet Take 25 mg by mouth 2 (two) times daily.  [provider]  montelukast (SINGULAIR) 10 MG tablet Take 10 mg by mouth at bedtime. 11/29/22   [provider]  Multiple Vitamin (MULTIVITAMIN WITH MINERALS) TABS tablet Take 1 tablet by mouth every evening.    [provider]  oxyCODONE  (OXY IR/ROXICODONE ) 5 MG immediate release tablet Take 1 tablet (5 mg total) by mouth every 6 (six) hours as needed for severe pain (pain score 7-10). 07/28/23   Timmy Forbes, MD  OZEMPIC, 0.25 OR 0.5 MG/DOSE, 2 MG/3ML SOPN Inject 0.25 mg into the skin every Monday. 11/09/21   [provider]  pantoprazole  (PROTONIX ) 40 MG tablet Take 40 mg by mouth daily before breakfast. 04/14/19   [provider]  predniSONE  (DELTASONE ) 5 MG tablet Take 5 mg by mouth in the morning.    [provider]  QUEtiapine  (SEROQUEL) 25 MG tablet Take 25 mg by mouth at bedtime.    [provider]  rOPINIRole  (REQUIP ) 2 MG tablet Take 2 mg by mouth in the morning, at noon, and at bedtime.    [provider]  spironolactone (ALDACTONE) 25 MG tablet Take 12.5 mg by mouth every evening.    [provider]  TRELEGY ELLIPTA 100-62.5-25 MCG/ACT AEPB Inhale 1 puff into the lungs daily.    [provider]  Turmeric 500 MG CAPS Take 500 mg by mouth every evening.    [provider]  Vibegron  (GEMTESA ) 75 MG TABS Take 1 tablet (75 mg total) by mouth daily. 03/25/23   Vaillancourt, Samantha, PA-C     Family History  Problem Relation Age of Onset   Heart Problems Mother    Heart Problems Father    Heart attack Father    Diabetes Brother    Diabetes Brother     Social History   Socioeconomic History   Marital status: Single    Spouse name: Not on file   Number of children: Not on file   Years of education: Not on file   Highest education level: Not on file  Occupational History   Occupation: retired  Tobacco Use   Smoking status: Never    Passive exposure: Never   Smokeless tobacco: Never  Vaping Use   Vaping status: Never Used  Substance and Sexual Activity   Alcohol use: No   Drug use: No   Sexual activity: Not on file  Other Topics Concern   Not on file  Social History Narrative   Lives at home independently. Has a cane to ambulate occasionally. Continues to drive.   Social Drivers of Corporate investment banker Strain: Low Risk  (04/22/2023)   Received from [facility]   Overall Financial Resource Strain (CARDIA)    Difficulty of Paying Living Expenses: Not hard at all  Food Insecurity: No Food Insecurity (07/04/2023)   Hunger Vital Sign    Worried About Running Out of Food in the Last Year: Never true    Ran Out of Food in the Last Year: Never true  Transportation Needs: No Transportation Needs (07/04/2023)   PRAPARE - Therapist, art (Medical): No    Lack of Transportation (Non-Medical): No  Physical Activity: Not on file  Stress: Not on file  Social Connections: Socially Isolated (07/04/2023)   Social Connection and Isolation Panel [NHANES]    Frequency of Communication with Friends and Family: More than three times a week    Frequency of Social Gatherings with Friends and Family: More than three  times a week    Attends Religious Services: Never    Active Member of Clubs or Organizations: No    Attends Banker Meetings: Never    Marital Status: Widowed     Review of Systems: A 12 point ROS discussed and pertinent positives are indicated in the HPI above.  All other systems are negative.  Vital Signs: BP 128/80 (BP Location: Left Arm)   Pulse 82   Temp 98 F (36.7 C) (Oral)   Resp 12   Ht 5' (1.524 m)   Wt 152 lb 5.4 oz (69.1 kg)   SpO2 94%   BMI 29.75 kg/m   Advance Care Plan: The advanced care place/surrogate decision maker was discussed at the time of visit and the patient did not wish to discuss or was not able to name a surrogate decision maker or provide an advance care plan.  Physical Exam Constitutional:      General: She is not in acute distress.    Appearance: Normal appearance.  HENT:     Mouth/Throat:     Mouth: Mucous membranes are dry.  Cardiovascular:     Rate and Rhythm: Normal rate and regular rhythm.     Pulses: Normal pulses.     Heart sounds: Murmur heard.  Pulmonary:     Effort: Pulmonary effort is normal.     Breath sounds: Normal breath sounds. No wheezing.  Abdominal:     General: Abdomen is flat. There is no distension.     Palpations: Abdomen is soft.     Tenderness: There is no abdominal tenderness.  Musculoskeletal:        General: Normal range of motion.     Cervical back: Normal range of motion.  Skin:    General: Skin is warm and dry.  Neurological:     Mental Status: She is alert and oriented to person, place, and time.   Psychiatric:        Mood and Affect: Mood normal.        Behavior: Behavior normal.        Thought Content: Thought content normal.        Judgment: Judgment normal.     Imaging: NM PET Image Initial (PI) Skull Base To Thigh Result Date: 07/30/2023 CLINICAL DATA:  Subsequent treatment strategy for lymphoma. EXAM: NUCLEAR MEDICINE PET SKULL BASE TO THIGH TECHNIQUE: 8.2 mCi F-18 FDG was injected intravenously. Full-ring PET imaging was performed from the skull base to thigh after the radiotracer. CT data was obtained and used for attenuation correction and anatomic localization. Fasting blood glucose: 87 mg/dl COMPARISON:  CT chest abdomen pelvis 07/04/2023 and PET 10/05/2019. MR thoracic spine 06/16/2023. FINDINGS: Mediastinal blood pool activity: SUV max 2.4 Liver activity: SUV max 3.2 NECK: No abnormal hypermetabolism. Incidental CT findings: None. CHEST: No abnormal hypermetabolism. Incidental CT findings: Atherosclerotic calcification of the aorta, aortic valve and coronary arteries. Heart is enlarged. No pericardial or pleural effusion. ABDOMEN/PELVIS: Soft tissue thickening deep to the upper midline ventral abdominal wall measures 3 mm (6/81), SUV max 8.7. Hypermetabolic abdominal retroperitoneal lymph nodes with index left periaortic lymph node measuring 8 mm (6/82), SUV max 5.5. Hypermetabolic right inguinal lymph node measures 11 mm (6/134), SUV max 8.2. No additional abnormal hypermetabolism. Incidental CT findings: Punctate right renal stone. Low-attenuation lesions in the kidneys. No specific follow-up necessary. Calcifications in the pancreatic tail. Liver, gallbladder, adrenal glands, kidneys, spleen, pancreas, stomach and bowel are otherwise grossly unremarkable. SKELETON: Hypermetabolism associated with the midthoracic  spine, with an associated pathologic T7 compression fracture. Tumor extent otherwise better described on MR thoracic spine 06/16/2023. No additional abnormal hypermetabolism.  Incidental CT findings: Degenerative changes in the spine. IMPRESSION: 1. Hypermetabolic lymphoma involving abdominal retroperitoneal and right pelvic lymph nodes as well as midthoracic spine (Deauville 5). 2. Hypermetabolic soft tissue thickening deep to the upper abdominal midline ventral wall, possibly due to lymphoma. 3. Punctate right renal stone. 4. Chronic calcific pancreatitis. 5. Aortic atherosclerosis (ICD10-I70.0). Coronary artery calcification. Electronically Signed   By: Shearon Denis M.D.   On: 07/30/2023 14:57   DG Chest Port 1 View Result Date: 07/30/2023 CLINICAL DATA:  Shortness of breath. EXAM: PORTABLE CHEST 1 VIEW COMPARISON:  07/04/2023. FINDINGS: Low lung volume. Bilateral lungs appear hyperlucent with coarse bronchovascular markings, concerning for underlying COPD. Bilateral lungs otherwise appear clear. No dense consolidation or lung collapse. Bilateral costophrenic angles are clear. Stable cardio-mediastinal silhouette. No acute osseous abnormalities. The soft tissues are within normal limits. IMPRESSION: No active disease. Electronically Signed   By: Beula Brunswick M.D.   On: 07/30/2023 14:33   IR CT BONE TROCAR/NEEDLE BIOPSY DEEP Result Date: 07/14/2023 INDICATION: 89 year old female with questionable lymphoma involving T6, T7, T8 with a small paraspinal mass. She presents for biopsy of T8 EXAM: DEEP BONE CORE BIOPSY MEDICATIONS: None. ANESTHESIA/SEDATION: Moderate (conscious) sedation was employed during this procedure. A total of Versed  1.0 mg and Fentanyl  50 mcg was administered intravenously. Moderate Sedation Time: 18 minutes. The patient's level of consciousness and vital signs were monitored continuously by radiology nursing throughout the procedure under my direct supervision. FLUOROSCOPY TIME:  Fluoroscopy Time: (159 mGy). COMPLICATIONS: None PROCEDURE: Informed written consent was obtained from the patient after a thorough discussion of the procedural risks, benefits and  alternatives. All questions were addressed. Maximal Sterile Barrier Technique was utilized including caps, mask, sterile gowns, sterile gloves, sterile drape, hand hygiene and skin antiseptic. A timeout was performed prior to the initiation of the procedure. Patient was positioned supine under the image intensifier. Images were stored and sent to PACs identifying the T6, T7, T8 levels. The patient was then prepped and draped in the usual sterile fashion. 1% lidocaine  was used for local anesthesia. A small stab incision was made with 11 blade scalpel. Using a single T8 Uni pedicular approach on the right, a Murphy needle/cannula was advanced with fluoroscopic guidance through the right pedicle into the posterior aspect of the T8 vertebral body. Once we confirmed the cannula position multiple coaxial core biopsy were performed. The cannula was adjusted slightly in cranial caudal and medial positioning on each pass of the coaxial biopsy needle. The specimen was a combination of clotted blood products as well as small bone fragments. We then passed the 11 gauge Murphy needle for a final core biopsy of the vertebral body. Specimen was retrieved. All of the specimen was sent in fresh medium. Patient tolerated the procedure well and remained hemodynamically stable throughout. No complications were encountered and no significant blood loss. IMPRESSION: Status post image guided biopsy of T8 vertebral body lesion via a right-sided Uni pedicular approach. Signed, Marciano Settles. Rexine Cater, RPVI [facility] Electronically Signed   By: Myrlene Asper D.O.   On: 07/14/2023 15:39   CT CHEST ABDOMEN PELVIS W CONTRAST Result Date: 07/04/2023 CLINICAL DATA:  Metastatic disease evaluation. Tumor involving T6-T9. * Tracking Code: BO * EXAM: CT CHEST, ABDOMEN, AND PELVIS WITH CONTRAST TECHNIQUE: Multidetector CT imaging of the chest, abdomen and pelvis  was performed following  the standard protocol during bolus administration of intravenous contrast. RADIATION DOSE REDUCTION: This exam was performed according to the departmental dose-optimization program which includes automated exposure control, adjustment of the mA and/or kV according to patient size and/or use of iterative reconstruction technique. CONTRAST:  75mL OMNIPAQUE  IOHEXOL  300 MG/ML  SOLN COMPARISON:  MRI thoracic spine dated 06/16/2023, CT abdomen and pelvis dated 06/05/2023, CTA chest dated 04/28/2020 FINDINGS: CT CHEST FINDINGS Cardiovascular: Normal heart size. No significant pericardial fluid/thickening. Great vessels are normal in course and caliber. No central pulmonary emboli. Coronary artery calcifications. Mediastinum/Nodes: Small hiatal hernia. No pathologically enlarged axillary, supraclavicular, mediastinal, or hilar lymph nodes. Lungs/Pleura: The central airways are patent. Unchanged 4 x 4 mm right apical nodule (3:28). 2 mm bilateral lower lobe nodules (3:55, 61), not definitely seen on prior examination. No focal consolidation. No pneumothorax. No pleural effusion. Musculoskeletal: Similar severe compression of T7 with surrounding paraspinal soft tissue thickening, better evaluated on prior MRI. Bilateral bursa at the shoulders, left greater than right. Bilateral soft tissue densities deep to the scapula, likely benign elastofibroma dorsi. CT ABDOMEN PELVIS FINDINGS Hepatobiliary: Multifocal calcified foci along the periphery of liver. No intra or extrahepatic biliary ductal dilation. Normal gallbladder. Pancreas: No focal lesions or main ductal dilation. Spleen: Normal in size without focal abnormality. Adrenals/Urinary Tract: No adrenal nodules. No suspicious renal masses. Punctate nonobstructing right renal stones. No hydronephrosis. No focal bladder wall thickening. Stomach/Bowel: Normal appearance of the stomach. No evidence of bowel wall thickening, distention, or inflammatory changes. Colonic  diverticulosis without acute diverticulitis. Appendix is not discretely seen. Vascular/Lymphatic: Aortic atherosclerosis. No enlarged abdominal or pelvic lymph nodes. Reproductive: No adnexal masses. Other: No free fluid, fluid collection, or free air. Musculoskeletal: No acute or abnormal lytic or blastic osseous findings. Multilevel degenerative changes of the partially imaged thoracic and lumbar spine. Bilateral L5 pars interarticularis defects without listhesis. IMPRESSION: 1. Similar severe compression of T7 with surrounding paraspinal soft tissue thickening, better evaluated on prior MRI. 2. Unchanged 4 mm right apical nodule, likely benign. 2 mm bilateral lower lobe nodules, not definitely seen on prior examination, indeterminate. 3. No evidence of metastatic disease in the abdomen or pelvis. 4. Aortic Atherosclerosis (ICD10-I70.0). Coronary artery calcifications. Assessment for potential risk factor modification, dietary therapy or pharmacologic therapy may be warranted, if clinically indicated. Electronically Signed   By: Limin  Xu M.D.   On: 07/04/2023 16:40   DG Chest 2 View Result Date: 07/04/2023 CLINICAL DATA:  Shortness of breath.  History of lymphoma EXAM: CHEST - 2 VIEW COMPARISON:  Chest x-ray 01/30/2021.  Thoracic spine MRI 06/16/2023 FINDINGS: Underinflation. Normal cardiopericardial silhouette. Calcified aorta. There is some linear opacity at the left lung base likely scar or atelectasis. Bronchovascular crowding identified. Chronic interstitial lung changes are again seen. There is compression deformity with some kyphosis along the midthoracic spine corresponding to the finding by MRI of a T7 compression injury. Please correlate with prior workup IMPRESSION: Underinflation. Left basilar scar atelectasis. Chronic lung changes. Kyphosis with compression deformity along the midthoracic spine as previously described by MRI Electronically Signed   By: Adrianna Horde M.D.   On: 07/04/2023 09:58     Labs:  CBC: Recent Labs    07/14/23 1226 07/28/23 1459 07/30/23 1405 07/31/23 0734  WBC 7.3 8.4 6.5 5.9  HGB 12.3 12.5 12.8 12.3  HCT 37.5 38.0 38.7 36.9  PLT 237 243 225 223    COAGS: Recent Labs    07/14/23 1226  INR 1.1  BMP: Recent Labs    07/04/23 0941 07/14/23 1226 07/28/23 1459 07/30/23 1405  NA 135 135 136 136  K 3.7 3.5 4.1 4.2  CL 96* 97* 95* 97*  CO2 29 26 29 28   GLUCOSE 89 95 113* 103*  BUN 33* 36* 39* 34*  CALCIUM 9.1 9.0 8.7* 9.1  CREATININE 1.35* 1.35* 1.36* 1.61*  GFRNONAA 38* 38* 37* 31*    LIVER FUNCTION TESTS: Recent Labs    07/28/23 1459  BILITOT 0.9  AST 25  ALT 22  ALKPHOS 67  PROT 7.7  ALBUMIN 3.9    TUMOR MARKERS: No results for input(s): "AFPTM", "CEA", "CA199", "CHROMGRNA" in the last 8760 hours.  Assessment and Plan: Per Dr. Jackqueline Mason progress note on 6/2: "History of lymphoma.  Labs are reviewed.  Increased LDH as well as uric acid. I recommend patient to continue allopurinol  200 mg daily. Abnormal LDH and uric acid may be secondary to infection, gout flare, versus recurrence of lymphoma. I will obtain a PET scan for further evaluation."  PET scan on 6/4 was notable for hypermetabolic lymphoma involving abdominal retroperitoneal and right pelvic lymph nodes as well as midthoracic spine (Deauville 5). Also notable for hypermetabolic soft tissue thickening deep to the upper abdominal midline ventral wall, possibly due to lymphoma.  Patient was seen in ED yesterday after the PET scan for chest pains. She was discharged, stable, with no interventions performed. Serial troponins were negative.   Patient presents for scheduled bone marrow biopsy and aspiration in IR today.  Patient has been NPO since midnight.  All labs and medications are within acceptable parameters.  No pertinent allergies.   Risks and benefits of bone marrow biopsy and aspiration was discussed with the patient and/or patient's family including, but  not limited to bleeding, infection, damage to adjacent structures or low yield requiring additional tests.  All of the questions were answered and there is agreement to proceed.  Consent signed and in chart.       Thank you for allowing our service to participate in NIKCOLE EISCHEID 's care.  Electronically Signed: Lovena Rubinstein, PA-C   07/31/2023, 9:22 AM      I spent a total of 25 Minutes in face to face in clinical consultation, greater than 50% of which was counseling/coordinating care for lymphoma, with consideration for bone marrow biopsy.

## 2023-07-30 NOTE — ED Triage Notes (Signed)
 Pt arrived via wheelchair from a rapid response in Nuc med [facility]. Pt sts that she started to have CP and SOB. Pt sts that she was dx with cancer.

## 2023-07-30 NOTE — ED Notes (Signed)
 This tech and [place] EDT assisted pt to the bathroom. Pt urinated in toilet. Pt was assisted back into bed and hooked back up to the monitor.

## 2023-07-30 NOTE — Progress Notes (Signed)
  Chaplain On-Call responded to Rapid Response notification at 1349 hours to Room 2 in PET Scan of Nuclear Medicine.  Radiology Staff reported that the patient was transported to the Emergency Department , room 18.  Chaplain met the patient and provided spiritual and emotional support as she described much pain in her spine, and seeking to cope with a new diagnosis of cancer in her spine.  The patient spoke confidently about her faith, and reviewed many events in which she has felt God's presence in times of distress.  Chaplain Dean Every., [facility]

## 2023-07-30 NOTE — Significant Event (Signed)
 Arrived to PET scan area in response to overhead rapid response page. Pt awake alert, oriented. In wheelchair. Reported pt was complaining of shortness of breath, chest pain. Was reported pt had episode of tachycardia. Pt has weak pulse(radial), maintaining airway. States chest pain is easing off. Agreed to transport to ED. Pt remained on supplemental O2 for transport. Taken in wheelchair to room 18, without episode. RN Staff present in room.

## 2023-07-30 NOTE — ED Provider Notes (Signed)
 [facility] Provider Note    Event Date/Time   First MD Initiated Contact with Patient 07/30/23 1404     (approximate)   History   Chest Pain   HPI  [name] is a 89 y.o. female with a history of CKD, CAD, CHF, non-Hodgkin lymphoma who presents with chest pain, acute onset within the last hour right after the patient had gotten a PET scan, sharp, radiating to the back, associated with shortness of breath and lightheadedness.  The patient states that the symptoms lasted a few minutes and subsequently started to resolve.  The pain is minimal now and has almost completely resolved.  She denies any leg swelling.  She has no fever or chills.  The patient states that she had an almost identical episode while at home within the last couple of weeks.  EMS was called out but the patient ended up not coming to the hospital.  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  I reviewed past medical records.  The patient was admitted to the hospitalist service on 5/9 due to a spinal cord mass.  She is being followed by oncology here and her last visit was on 6/2.   Physical Exam   Triage Vital Signs: ED Triage Vitals  Encounter Vitals Group     BP 07/30/23 1403 132/76     Systolic BP Percentile --      Diastolic BP Percentile --      Pulse Rate 07/30/23 1401 68     Resp 07/30/23 1401 19     Temp 07/30/23 1403 98.2 F (36.8 C)     Temp Source 07/30/23 1403 Oral     SpO2 07/30/23 1400 100 %     Weight 07/30/23 1400 150 lb (68 kg)     Height 07/30/23 1400 5' (1.524 m)     Head Circumference --      Peak Flow --      Pain Score 07/30/23 1400 0     Pain Loc --      Pain Education --      Exclude from Growth Chart --     Most recent vital signs: Vitals:   07/30/23 1403 07/30/23 1500  BP: 132/76 (!) 118/53  Pulse:  65  Resp:  17  Temp: 98.2 F (36.8 C)   SpO2:  99%    General: Alert, relatively  well-appearing, no distress.  CV:  Good peripheral perfusion.  Normal heart sounds. Resp:  Normal effort.  Lungs CTAB. Abd:  No distention.  Other:  No peripheral edema.   ED Results / Procedures / Treatments   Labs (all labs ordered are listed, but only abnormal results are displayed) Labs Reviewed  BASIC METABOLIC PANEL WITH GFR - Abnormal; Notable for the following components:      Result Value   Chloride 97 (*)    Glucose, Bld 103 (*)    BUN 34 (*)    Creatinine, Ser 1.61 (*)    GFR, Estimated 31 (*)    All other components within normal limits  CBC  TROPONIN I (HIGH SENSITIVITY)  TROPONIN I (HIGH SENSITIVITY)     EKG  ED ECG REPORT I, Lind Repine, the attending physician, personally viewed and interpreted this ECG.  Date: 07/30/2023 EKG Time: 1402 Rate: 66 Rhythm: normal sinus rhythm QRS Axis: normal Intervals: normal ST/T Wave abnormalities: normal Narrative Interpretation: no evidence of acute ischemia  RADIOLOGY  Chest x-ray: I independently viewed and interpreted the images; there is no focal consolidation or edema   PROCEDURES:  Critical Care performed: No  Procedures   MEDICATIONS ORDERED IN ED: Medications - No data to display   IMPRESSION / MDM / ASSESSMENT AND PLAN / ED COURSE  I reviewed the triage vital signs and the nursing notes.  89 year old female with PMH as noted above presents with acute onset of chest pain after getting a PET scan this afternoon.  The pain is now almost completely resolved.  On exam the patient is overall relatively well-appearing.  Her vital signs are normal.  Physical exam is unremarkable for acute findings.  EKG is nonischemic.  Differential diagnosis includes, but is not limited to, acute anxiety/panic, musculoskeletal pain, GERD, medication reaction, less likely ACS.  I have a very low suspicion for PE given that the pain has almost completely resolved on its own, as well as the lack of tachycardia  or hypoxia.  The patient has no DVT symptoms.  Similarly there is no clinical evidence for aortic dissection or other vascular etiology.  We will obtain basic labs, cardiac enzymes, chest x-ray, and reassess.  Patient's presentation is most consistent with acute presentation with potential threat to life or bodily function.  The patient is on the cardiac monitor to evaluate for evidence of arrhythmia and/or significant heart rate changes.   ----------------------------------------- 3:42 PM on 07/30/2023 -----------------------------------------  Chest x-ray is clear.  Initial troponin is negative.  BMP and CBC show no acute findings.  Plan will be repeat troponin; if it is negative the patient will likely be appropriate for discharge home.  I have signed her out to the oncoming ED physician Dr. Drenda Gentle.   FINAL CLINICAL IMPRESSION(S) / ED DIAGNOSES   Final diagnoses:  Atypical chest pain     Rx / DC Orders   ED Discharge Orders     None        Note:  This document was prepared using Dragon voice recognition software and may include unintentional dictation errors.    Lind Repine, MD 07/30/23 [phone]

## 2023-07-30 NOTE — Progress Notes (Signed)
 Patient for IR Bone Marrow Biopsy on Thurs 07/31/23, I called and spoke with the patient's daughter, Scarlette on the phone and gave pre-procedure instructions. Scarlette was made aware to have the patient here at 7:30a, NPO after MN prior to procedure as well as driver post procedure/recovery/discharge. Scarlette stated understanding.  Called 07/30/23

## 2023-07-31 ENCOUNTER — Encounter: Payer: Self-pay | Admitting: Radiology

## 2023-07-31 ENCOUNTER — Ambulatory Visit
Admission: RE | Admit: 2023-07-31 | Discharge: 2023-07-31 | Disposition: A | Discharge status: Home / Self Care | Source: Ambulatory Visit | Attending: Oncology | Admitting: Oncology

## 2023-07-31 ENCOUNTER — Other Ambulatory Visit: Payer: Self-pay

## 2023-07-31 DIAGNOSIS — I4891 Unspecified atrial fibrillation: Secondary | ICD-10-CM | POA: Insufficient documentation

## 2023-07-31 DIAGNOSIS — I2510 Atherosclerotic heart disease of native coronary artery without angina pectoris: Secondary | ICD-10-CM | POA: Diagnosis not present

## 2023-07-31 DIAGNOSIS — I509 Heart failure, unspecified: Secondary | ICD-10-CM | POA: Diagnosis not present

## 2023-07-31 DIAGNOSIS — Z1379 Encounter for other screening for genetic and chromosomal anomalies: Secondary | ICD-10-CM | POA: Diagnosis not present

## 2023-07-31 DIAGNOSIS — N183 Chronic kidney disease, stage 3 unspecified: Secondary | ICD-10-CM | POA: Diagnosis not present

## 2023-07-31 DIAGNOSIS — Z79899 Other long term (current) drug therapy: Secondary | ICD-10-CM | POA: Insufficient documentation

## 2023-07-31 DIAGNOSIS — E039 Hypothyroidism, unspecified: Secondary | ICD-10-CM | POA: Insufficient documentation

## 2023-07-31 DIAGNOSIS — C8590 Non-Hodgkin lymphoma, unspecified, unspecified site: Secondary | ICD-10-CM | POA: Diagnosis present

## 2023-07-31 DIAGNOSIS — E785 Hyperlipidemia, unspecified: Secondary | ICD-10-CM | POA: Diagnosis not present

## 2023-07-31 DIAGNOSIS — K219 Gastro-esophageal reflux disease without esophagitis: Secondary | ICD-10-CM | POA: Diagnosis not present

## 2023-07-31 DIAGNOSIS — J449 Chronic obstructive pulmonary disease, unspecified: Secondary | ICD-10-CM | POA: Insufficient documentation

## 2023-07-31 DIAGNOSIS — I130 Hypertensive heart and chronic kidney disease with heart failure and stage 1 through stage 4 chronic kidney disease, or unspecified chronic kidney disease: Secondary | ICD-10-CM | POA: Diagnosis not present

## 2023-07-31 HISTORY — PX: IR BONE MARROW BIOPSY & ASPIRATION: IMG5727

## 2023-07-31 LAB — CBC WITH DIFFERENTIAL/PLATELET
Abs Immature Granulocytes: 0.02 10*3/uL (ref 0.00–0.07)
Basophils Absolute: 0 10*3/uL (ref 0.0–0.1)
Basophils Relative: 1 %
Eosinophils Absolute: 0.2 10*3/uL (ref 0.0–0.5)
Eosinophils Relative: 4 %
HCT: 36.9 % (ref 36.0–46.0)
Hemoglobin: 12.3 g/dL (ref 12.0–15.0)
Immature Granulocytes: 0 %
Lymphocytes Relative: 19 %
Lymphs Abs: 1.1 10*3/uL (ref 0.7–4.0)
MCH: 31.6 pg (ref 26.0–34.0)
MCHC: 33.3 g/dL (ref 30.0–36.0)
MCV: 94.9 fL (ref 80.0–100.0)
Monocytes Absolute: 0.8 10*3/uL (ref 0.1–1.0)
Monocytes Relative: 13 %
Neutro Abs: 3.8 10*3/uL (ref 1.7–7.7)
Neutrophils Relative %: 63 %
Platelets: 223 10*3/uL (ref 150–400)
RBC: 3.89 MIL/uL (ref 3.87–5.11)
RDW: 15.5 % (ref 11.5–15.5)
WBC: 5.9 10*3/uL (ref 4.0–10.5)
nRBC: 0 % (ref 0.0–0.2)

## 2023-07-31 MED ORDER — FENTANYL CITRATE (PF) 100 MCG/2ML IJ SOLN
INTRAMUSCULAR | Status: AC | PRN
  Administered 2023-07-31: 50 ug via INTRAVENOUS

## 2023-07-31 MED ORDER — FENTANYL CITRATE (PF) 100 MCG/2ML IJ SOLN
INTRAMUSCULAR | Status: AC
  Filled 2023-07-31: qty 2

## 2023-07-31 MED ORDER — HEPARIN SOD (PORK) LOCK FLUSH 100 UNIT/ML IV SOLN
INTRAVENOUS | Status: AC
  Filled 2023-07-31: qty 5

## 2023-07-31 MED ORDER — SODIUM CHLORIDE 0.9 % IV SOLN: INTRAVENOUS | Status: DC

## 2023-07-31 MED ORDER — FENTANYL CITRATE (PF) 100 MCG/2ML IJ SOLN
25.0000 ug | Freq: Once | INTRAMUSCULAR | Status: AC
  Administered 2023-07-31: 25 ug via INTRAVENOUS

## 2023-07-31 MED ORDER — MIDAZOLAM HCL 2 MG/2ML IJ SOLN
INTRAMUSCULAR | Status: AC
  Filled 2023-07-31: qty 2

## 2023-07-31 MED ORDER — DIPHENHYDRAMINE HCL 50 MG/ML IJ SOLN
INTRAMUSCULAR | Status: AC
  Filled 2023-07-31: qty 1

## 2023-07-31 MED ORDER — DIPHENHYDRAMINE HCL 50 MG/ML IJ SOLN
12.5000 mg | Freq: Once | INTRAMUSCULAR | Status: AC
  Administered 2023-07-31: 12.5 mg via INTRAVENOUS

## 2023-07-31 MED ORDER — MIDAZOLAM HCL 5 MG/5ML IJ SOLN
INTRAMUSCULAR | Status: AC | PRN
  Administered 2023-07-31: 1 mg via INTRAVENOUS

## 2023-07-31 MED ORDER — LIDOCAINE 1 % OPTIME INJ - NO CHARGE
10.0000 mL | Freq: Once | INTRAMUSCULAR | Status: AC
  Administered 2023-07-31: 10 mL via INTRADERMAL
  Filled 2023-07-31: qty 10

## 2023-07-31 NOTE — CV Procedure (Signed)
 Pre procedural Dx: lymphoma  Post procedural Dx: Same  Technically successful fluoro guided biopsy of right iliac bone marrow   EBL: None.   Complications: None immediate.   Zettie Hillock, MD Pager #: [phone]

## 2023-07-31 NOTE — Progress Notes (Signed)
 Patient clinically stable post post IR BMB per  Dr Burna Carrier, tolerated well. Received Versed  1 mg along with Fentanyl  50 mcg IV for procedure. Report given to [place] Rn post procedure/specials/17

## 2023-08-01 ENCOUNTER — Encounter: Payer: Self-pay | Admitting: Oncology

## 2023-08-04 ENCOUNTER — Ambulatory Visit: Payer: Self-pay | Admitting: Oncology

## 2023-08-05 LAB — SURGICAL PATHOLOGY

## 2023-08-05 NOTE — Telephone Encounter (Signed)
 Pt had bx on 6/5. Path is not back, please schedule MD (for results) this Friday or early next week. Please notify pt or daughter Scarlette of appt

## 2023-08-05 NOTE — Telephone Encounter (Signed)
-----   Message from Timmy Forbes sent at 08/04/2023 11:44 PM EDT ----- Please ask pathology Dr. Almeda Jacobs to give me a call.  Please arrange pt to see me 1 week after bone marrow biopsy.

## 2023-08-05 NOTE — Progress Notes (Signed)
 Message left with path staff for Dr. Almeda Jacobs to contact Dr. Wilhelmenia Harada.

## 2023-08-08 ENCOUNTER — Encounter: Payer: Self-pay | Admitting: Oncology

## 2023-08-08 ENCOUNTER — Encounter (HOSPITAL_COMMUNITY): Payer: Self-pay

## 2023-08-08 ENCOUNTER — Inpatient Hospital Stay (HOSPITAL_BASED_OUTPATIENT_CLINIC_OR_DEPARTMENT_OTHER): Admitting: Oncology

## 2023-08-08 VITALS — BP 116/54 | HR 73 | Temp 97.5°F | Resp 18 | Wt 152.2 lb

## 2023-08-08 DIAGNOSIS — C8590 Non-Hodgkin lymphoma, unspecified, unspecified site: Secondary | ICD-10-CM | POA: Diagnosis not present

## 2023-08-08 DIAGNOSIS — Z7189 Other specified counseling: Secondary | ICD-10-CM | POA: Diagnosis not present

## 2023-08-08 DIAGNOSIS — N1832 Chronic kidney disease, stage 3b: Secondary | ICD-10-CM | POA: Diagnosis not present

## 2023-08-08 DIAGNOSIS — Z5112 Encounter for antineoplastic immunotherapy: Secondary | ICD-10-CM | POA: Diagnosis not present

## 2023-08-08 MED ORDER — DEXAMETHASONE 4 MG PO TABS: 20.0000 mg | ORAL_TABLET | Freq: Every day | ORAL | 0 refills | Status: DC

## 2023-08-08 MED ORDER — OXYCODONE HCL 5 MG PO TABS
5.0000 mg | ORAL_TABLET | Freq: Four times a day (QID) | ORAL | 0 refills | Status: DC | PRN
Start: 2023-08-11 — End: 2023-08-22

## 2023-08-08 NOTE — Assessment & Plan Note (Signed)
 Encourage oral hydration and avoid nephrotoxins.

## 2023-08-08 NOTE — Assessment & Plan Note (Signed)
 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 I discussed with pathology.  Awaiting lymphoma NGS results. Overall clinically patient has stage IV non-Hodgkin's lymphoma, low-grade She is symptomatic. Given her age, other medical conditions, I recommend weekly rituximab x 4 Rationale potential side effects were reviewed and discussed patient and daughter.  They agreed with the plan. I recommend dexamethasone  20 mg daily for 4 days to temporize her symptoms.

## 2023-08-08 NOTE — Assessment & Plan Note (Signed)
 Course of care was discussed with patient and daughter.  Palliative intent.

## 2023-08-08 NOTE — Progress Notes (Signed)
START ON PATHWAY REGIMEN - Lymphoma and CLL     A cycle is every 7 days:     Rituximab-xxxx   **Always confirm dose/schedule in your pharmacy ordering system**  Patient Characteristics: Follicular Lymphoma, Grades 1, 2, and 3A, First Line, Stage III / IV, Symptomatic or Bulky Disease Disease Type: Follicular Lymphoma, Grade 1, 2, or 3A Disease Type: Not Applicable Disease Type: Not Applicable Line of Therapy: First Line Disease Characteristics: Symptomatic or Bulky Disease Intent of Therapy: Non-Curative / Palliative Intent, Discussed with Patient

## 2023-08-08 NOTE — Progress Notes (Signed)
 Hematology/Oncology Progress note [phone] [phone]      Patient Care Team: Antonio Baumgarten, MD as PCP - General (Internal Medicine) Timmy Forbes, MD as Consulting Physician (Oncology)  REFERRING PROVIDER: Dr. Kevan Peers CHIEF COMPLAINTS/REASON FOR VISIT:  Follow up for lymphoma.  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 I discussed with pathology.  Awaiting lymphoma NGS results. Overall clinically patient has stage IV non-Hodgkin's lymphoma, low-grade She is symptomatic. Given her age, other medical conditions, I recommend weekly rituximab x 4 Rationale potential side effects were reviewed and discussed patient and daughter.  They agreed with the plan. I recommend dexamethasone  20 mg daily for 4 days to temporize her symptoms.  Goals of care, counseling/discussion Course of care was discussed with patient and daughter.  Palliative intent.  Chronic kidney disease (CKD), stage III (moderate) (HCC) Encourage oral hydration and avoid nephrotoxins.    Orders Placed This Encounter  Procedures   Consent Attestation for Oncology Treatment    The patient is informed of risks, benefits, side-effects of the prescribed oncology treatment. Potential short term and long term side effects and response rates discussed. After a long discussion, the patient made informed decision to proceed.:   Yes   CBC (Cancer Center Only)    Standing Status:   Future    Expected Date:   08/15/2023    Expiration Date:   08/14/2024   CMP (Cancer Center only)    Standing Status:   Future    Expected Date:   08/15/2023    Expiration Date:   08/14/2024   CBC (Cancer Center Only)    Standing Status:   Future    Expected Date:   08/22/2023    Expiration Date:   08/21/2024   CMP (Cancer Center  only)    Standing Status:   Future    Expected Date:   08/22/2023    Expiration Date:   08/21/2024   PHYSICIAN COMMUNICATION ORDER    Hepatitis B Virus screening with HBsAg and anti-HBc recommended prior to treatment with rituximab, ofatumumab, or obinutuzumab.   Follow-up after bone marrow biopsy and PET scan. All questions were answered. The patient knows to call the clinic with any problems, questions or concerns. We spent sufficient time to discuss many aspect of care, questions were answered to patient's satisfaction.  Timmy Forbes, MD, PhD [facility] Hematology Oncology 08/08/2023   HISTORY OF PRESENTING ILLNESS:  [name] is a  89 y.o.  female with PMH listed below who was referred to me for evaluation of neck swelling, history of lymphoma. Patient reports remote history of lymphoma many years ago.  She recalls that she received chemotherapy at that time.  Followed up with Dr. Suezanne Emperor who retired from our cancer center.  She was recently seen by PCP and has complained about her concerns of bilateral neck swelling for a while. She could not specify when she noticed this change. She reports feeling anxious as her initial symptoms which led to lymphoma diagnosis was neck LN swelling.   obtained patient's previous records in EMR by Dr.Choski. Patient has history of non hodgkin's lymphoma and s/p chemotherapy treatment. Her pathology was not available to me.   # 12/08/2017 US  thryoid done which showed left mid thyroid  nodule, 2.2 cm x 2.1cm x 2.1cm.  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 Afirma GSC benign, ROM 4%.  Patient was previously  referred to endocrinology for further management, and appears that patient never established care there.   09/21/2019, patient was evaluated in emergency room for confusion.  Due to the long wait time, patient left without seeing physician in the ED.  Patient had chest x-ray and CT head without contrast along with lab work.   Chest x-ray was negative and CT was negative for acute abnormality.  UA reviewed you urinary tract infection.  Patient presents for additional lab work and during waiting, patient was found to be confused and talk to herself.  She was approached by nursing staff and escorted to examining room for evaluation.  Patient was seen by symptom management nurse practitioner Bridgette Campus.  Lab work also showed AKI with creatinine increased to 1.5. Patient was treated with supportive care with IV fluids nitrofurantoin  for 7 days for UTI.  Urine culture lateral positive for E. Coli  INTERVAL HISTORY CHAN [name] is a 89 y.o. female who has above history reviewed by me today presents for follow up visit for history of lymphoma.  Pathological compression, paraspinal soft tissue mass. Accompanied by daughter [name] who is also patient's power of attorney. Patient reports not feeling good.  She has constant abdominal bloating/hard belly. 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  Oncology History  Non-Hodgkin lymphoma (HCC)  06/05/2023 Imaging   CT abdomen pelvis with contrast showed Narrative & Impression  CLINICAL DATA:  Right-sided abdominal pain, history of non-Hodgkin lymphoma   EXAM: CT ABDOMEN AND PELVIS WITH CONTRAST   TECHNIQUE: Multidetector CT imaging of the abdomen and pelvis was performed using the standard protocol following bolus administration of intravenous contrast.   RADIATION DOSE REDUCTION: This exam was performed according to the departmental dose-optimization program which includes automated exposure control, adjustment of the mA and/or kV according to patient size and/or use of iterative reconstruction technique.   CONTRAST:  80mL OMNIPAQUE  IOHEXOL  300 MG/ML  SOLN   COMPARISON:  04/28/2020   FINDINGS: Lower chest: No acute pleural or parenchymal lung disease.   Hepatobiliary: No focal liver abnormality is seen. No gallstones, gallbladder wall thickening, or  biliary dilatation.   Pancreas: Unremarkable. No pancreatic ductal dilatation or surrounding inflammatory changes.   Spleen: Normal in size without focal abnormality.   Adrenals/Urinary Tract: Mild bilateral renal cortical atrophy. No urinary tract calculi or obstructive uropathy. The adrenals and bladder are unremarkable.   Stomach/Bowel: No bowel obstruction or ileus. Diverticulosis of the descending and sigmoid colon. No evidence of acute diverticulitis. No bowel wall thickening or inflammatory change.   Vascular/Lymphatic: Aortic atherosclerosis. No enlarged abdominal or pelvic lymph nodes.   Reproductive: Status post hysterectomy. No adnexal masses.   Other: No free fluid or free intraperitoneal gas. No abdominal wall hernia.   Musculoskeletal: There is an age-indeterminate T7 compression deformity, with greater than 50% loss of height. Mild paraspinal soft tissue swelling at this location suggests acute to subacute fracture. While there is no significant retropulsion, there does appear to be soft tissue density causing significant central canal stenosis. If further evaluation is desired, MRI may be useful.   No other acute bony abnormalities. Severe spondylosis and facet hypertrophy within the lower lumbar spine, most pronounced at L4-5 with significant central canal stenosis. This is a chronic stable finding. Reconstructed images demonstrate no additional findings.   IMPRESSION: 1. Likely acute to subacute T7 compression fracture, with greater than 50% loss of height. Prominent paraspinal soft tissue swelling is noted, as well as soft tissue density in  the central canal likely contributing to significant central canal stenosis. Further evaluation with MRI may be useful. 2. Distal colonic diverticulosis without diverticulitis. 3.  Aortic Atherosclerosis (ICD10-I70.0).     06/16/2023 Imaging   MRI thoracic spine with and without contrast showed Tumor involving the  T6-T9 vertebrae and paraspinal soft tissues with associated severe pathologic T7 compression fracture and epidural tumor resulting in moderate spinal stenosis.     07/04/2023 Initial Diagnosis   Non-Hodgkin lymphoma (HCC)  Patient reports remote history of non-Hodgkin lymphoma which was treated by Dr. Suezanne Emperor.  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  07/04/2023, patient presented to emergency room due to findings of abnormal CT findings. Paraspinal tumor involving T6/T9 vertebrae with associated severe compression fracture of the T7, moderate spinal stenosis.  Patient was seen by Dr. Aris Bel during the hospitalization.  Patient was recommended to proceed with biopsy by IR outpatient.  07/14/2023 status post IR biopsy of T8 vertebral body Pathology showed atypical lymphoid infiltrate consistent with B-cell lymphoproliferative disorder.Morphologic evaluation of the bone biopsy reveals a monotonous atypical lymphoid infiltrate comprised of small to medium lymphocytes with monocytoid appearance.  Immunohistochemically, the atypical cells are positive for CD20, PAX5, CD79a and BCL2.  CD3 and CD5 highlight background T lymphocytes. CD10 and BCL6 highlight residual germinal centers.  CD138 and mum 1 highlight plasma cells.  TdT, CD34 and cyclin D1 are negative in the atypical cells.  CD43 appears to be positive in a subset of the atypical cells.  Kappa/lambda IHC are  noncontributory.  Overall the findings are consistent with a B-cell     lymphoproliferative disorder, particularly marginal zone lymphoma.  However amore definitive assessment is precluded by lack of clonality on  immunohistochemical staining and lack of separate tissue for flow cytometric analysis.  A lymphoma NGS panel will be performed and  results will be reported in an addendum.      07/04/2023 Imaging   CT chest abdomen pelvis with contrast 1. Similar severe compression of T7 with surrounding paraspinal soft tissue thickening, better evaluated on prior MRI. 2. Unchanged 4 mm right apical nodule, likely benign. 2 mm bilateral lower lobe nodules, not definitely seen on prior examination, indeterminate. 3. No evidence of metastatic disease in the abdomen or pelvis. 4. Aortic Atherosclerosis (ICD10-I70.0). Coronary artery calcifications. Assessment for potential risk factor modification, dietary therapy or pharmacologic therapy may be warranted, if clinically indicated.     07/28/2023 Imaging   PET scan showed 1. Hypermetabolic lymphoma involving abdominal retroperitoneal and right pelvic lymph nodes as well as midthoracic spine (Deauville 5). 2. Hypermetabolic soft tissue thickening deep to the upper abdominal midline ventral wall, possibly due to lymphoma. 3. Punctate right renal stone. 4. Chronic calcific pancreatitis. 5. Aortic atherosclerosis (ICD10-I70.0). Coronary artery calcification.   07/31/2023 Bone Marrow Biopsy   Bone marrow biopsy -  Overall normocellular bone marrow with orderly trilineage hematopoiesis.  PERIPHERAL BLOOD: -  Unremarkable smear review.  Note: It is noted the patient has a remote history of a lymphoma (2002 follicular lymphoma per discussion with Dr. Wilhelmenia Harada with a recent (07/14/2023) diagnosis of an atypical lymphoid infiltrate consistent with a B-cell lymphoproliferative disorder of the T8 vertebral body.  Lymphocytes are not increased on the aspirate smear slides as well as by immunohistochemical stains of the clot section; however, the clot section on the HE stained slide  did highlight a couple small well-circumscribed lymphoid aggregates that were lost on levels and the IHC stains.  These well-circumscribed lymphoid aggregates, however, are within the realm of reactive lymphoid aggregates given  their circumscription and age of patient.  Definitive bone marrow involvement by a lymphoproliferative disorder is not identified morphologically or immunohistochemically.    08/04/2023 Cancer Staging   Staging form: Hodgkin and Non-Hodgkin Lymphoma, AJCC 8th Edition - Clinical stage from 08/04/2023: Stage IV (Follicular lymphoma) - Signed by Timmy Forbes, MD on 08/04/2023 Stage prefix: Recurrence   08/15/2023 -  Chemotherapy   Patient is on Treatment Plan : NON-HODGKINS LYMPHOMA Rituximab q7d      Obtained previous medical records. She had left axillary lymph node excisional biopsy in February 2002.  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  04/24/2020, bone biopsy showed involvement of malignant lymphoma.  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 Patient received R-CHOP treatments and radiation. Today patient presents to discuss bone marrow biopsy results. She continues to feel poorly.  Abdominal distention and bloating.  No nausea vomiting.  Poor appetite.  Review of Systems  Constitutional:  Positive for appetite change, fatigue and unexpected weight change. Negative for chills and fever.  HENT:   Negative for hearing loss and voice change.   Eyes:  Negative for eye problems.  Respiratory:  Negative for chest tightness and cough.   Cardiovascular:  Negative for chest pain.  Gastrointestinal:  Negative for abdominal distention, abdominal pain and blood in stool.       Bloating  Endocrine: Negative for hot flashes.  Genitourinary:  Negative for difficulty urinating and frequency.   Musculoskeletal:  Negative for arthralgias.  Skin:  Negative for itching and rash.  Neurological:  Negative for extremity weakness.  Hematological:  Negative for adenopathy.  Psychiatric/Behavioral:  Negative for confusion.      MEDICAL HISTORY:  Past Medical History:  Diagnosis Date   Afib (HCC)    a.)  CHA2DS2-VASc = 6 (age x 2, sex, CHF, HTN, aortic plaque). b.) rate/rhythm maintained without pharmacological intervention; she is not on oral anticoagulation   Anxiety    Aortic atherosclerosis (HCC)    Arthritis    Basal cell carcinoma 11/11/2017   Right inferior knee. Superficial and nodular patterns.   Bilateral lower extremity edema    Chest pain 07/30/2023   CKD (chronic kidney disease), stage III (HCC)    Congestive heart failure (CHF) (HCC) 05/31/2014   a.) TTE 05/31/2014: EF >55%, RVE; LAE; triv panvalvular regurg; G1DD. b.) TTE 06/29/2017: EF >55%; RVE; BAE; G1DD. c.) TTE 05/09/2018: EF 55-60%; LAE; mod MR, mild AR/TR. d.) TTE 04/29/2020: EF 60-65%; mi;d-mod MR/TR; RVSP 36.6 mmHg. e.) TTE 08/24/2020: EF >55%; mild LVH; mild MR/TR/PR; sev MAC.   COPD (chronic obstructive pulmonary disease) (HCC)    Coronary artery disease 06/03/2008   a.) LHC 06/03/2008: 40% pLAD-1, 60% pLAD-2, 90% mLAD-1, 30% mLAD-2, 50% mLAD-3 --> PCI placing a 2.0 x 12 mm Mini-Vision BMS x 1 to mLAD-1 lesion. b.) LHC 02/12/10: 50% pLAD, 30% ISR mLAD, 25% dLAD, 25% pLCx, 30% mLCx, 20% pRCA, 20% D1; further intervention deferred opting for medical mgmt.   DDD (degenerative disc disease), lumbar    Depression    Dyspnea 07/30/2023   GERD (gastroesophageal reflux disease)    HOH (hard of hearing)    a.) uses BILATERAL hearing aids   Hurthle cell neoplasm of thyroid   a.) s/p total thyroidectomy 11/17/2019   Hyperlipemia    Hypertension    Hypothyroidism    Lumbar spinal stenosis    Memory loss    Non Hodgkin's lymphoma (HCC) 2002   Squamous cell carcinoma of skin 06/13/2020   L mid pretibia, biopsy only   Urinary incontinence     SURGICAL HISTORY: Past Surgical History:  Procedure Laterality Date   CORONARY ANGIOPLASTY WITH STENT PLACEMENT Left 06/03/2008   Procedure: CORONARY ANGIOPLASTY WITH STENT PLACEMENT; Location: [facility]; Surgeon(s): Thomasene Flemings, MD (diagnostic) and Thais Fill, MD  (interventional)   EXCISION OF ABDOMINAL WALL TUMOR Right 05/08/2018   Procedure: EXCISION OF ABDOMINAL WALL-RIGHT;  Surgeon: Eldred Grego, MD;  Location: [facility];  Service: General;  Laterality: Right;   INCISIONAL HERNIA REPAIR N/A 05/08/2018   Procedure: LAPAROSCOPIC VS. OPEN INCISIONAL HERNIA REPAIR WITH MESH;  Surgeon: Eldred Grego, MD;  Location: [facility];  Service: General;  Laterality: N/A;   IR BONE MARROW BIOPSY & ASPIRATION  07/31/2023   IR CT BONE TROCAR/NEEDLE BIOPSY DEEP  07/14/2023   LEFT HEART CATH AND CORONARY ANGIOGRAPHY Left 02/12/2010   Procedure: LEFT HEART CATH AND CORONARY ANGIOGRAPHY; Location: [facility]; Surgeon: Thomasene Flemings, MD   LESION REMOVAL Left 08/22/2021   Procedure: LESION REMOVAL (KNEE);  Surgeon: Jackquelyn Mass, MD;  Location: [facility];  Service: Vascular;  Laterality: Left;   RIGHT/LEFT HEART CATH AND CORONARY ANGIOGRAPHY Bilateral 06/13/2023   Procedure: RIGHT/LEFT HEART CATH AND CORONARY ANGIOGRAPHY;  Surgeon: Antonette Batters, MD;  Location: [facility] INV[facility]IVE [facility];  Service: Cardiovascular;  Laterality: Bilateral;   THYROIDECTOMY Bilateral 11/17/2019   Procedure: TOTAL THYROIDECTOMY;  Surgeon: Mellody Sprout, MD;  Location: [facility] OR[facility];  Service: ENT;  Laterality: Bilateral;   TOTAL ABDOMINAL HYSTERECTOMY N/A    VEIN SURGERY     Endovenous ablation of saphenous vein    SOCIAL HISTORY: Social History   Socioeconomic History   Marital status: Single    Spouse name: Not on file   Number of children: Not on file   Years of education: Not on file   Highest education level: Not on file  Occupational History   Occupation: retired  Tobacco Use   Smoking status: Never    Passive exposure: Never   Smokeless tobacco: Never  Vaping Use   Vaping status: Never Used  Substance and Sexual Activity   Alcohol use: No   Drug use: No   Sexual activity: Not on file  Other Topics Concern   Not on file  Social History Narrative   Lives at home  independently. Has a cane to ambulate occasionally. Continues to drive.   Social Drivers of Corporate investment banker Strain: Low Risk  (04/22/2023)   Received from [facility]   Overall Financial Resource Strain (CARDIA)    Difficulty of Paying Living Expenses: Not hard at all  Food Insecurity: No Food Insecurity (07/04/2023)   Hunger Vital Sign    Worried About Running Out of Food in the Last Year: Never true    Ran Out of Food in the Last Year: Never true  Transportation Needs: No Transportation Needs (07/04/2023)   PRAPARE - Administrator, Civil Service (Medical): No    Lack of Transportation (Non-Medical): No  Physical Activity: Not on file  Stress: Not on file  Social Connections: Socially Isolated (07/04/2023)   Social Connection and Isolation Panel    Frequency of Communication with Friends and Family: More than three times a week  Frequency of Social Gatherings with Friends and Family: More than three times a week    Attends Religious Services: Never    Database administrator or Organizations: No    Attends Banker Meetings: Never    Marital Status: Widowed  Intimate Partner Violence: Not At Risk (07/04/2023)   Humiliation, Afraid, Rape, and Kick questionnaire    Fear of Current or Ex-Partner: No    Emotionally Abused: No    Physically Abused: No    Sexually Abused: No    FAMILY HISTORY: Family History  Problem Relation Age of Onset   Heart Problems Mother    Heart Problems Father    Heart attack Father    Diabetes Brother    Diabetes Brother     ALLERGIES:  is allergic to cefdinir, pregabalin, and sulfa antibiotics.  MEDICATIONS:  Current Outpatient Medications  Medication Sig Dispense Refill   albuterol  (PROVENTIL ) (2.5 MG/3ML) 0.083% nebulizer solution Take 2.5 mg by nebulization in the morning and at bedtime.     albuterol  (VENTOLIN  HFA) 108 (90 Base) MCG/ACT inhaler Inhale 2 puffs into the lungs 2 (two) times daily.      Calcium Citrate-Vitamin D (CALCIUM CITRATE + D3 PO) Take 1 tablet by mouth in the morning and at bedtime.     Cyanocobalamin (VITAMIN B 12 PO) Take 2,500 mcg by mouth in the morning.     dexamethasone  (DECADRON ) 4 MG tablet Take 5 tablets (20 mg total) by mouth daily. Take 2 tabs twice a day starting the day before chemotherapy, 2 tabs at bedtime the night of chemo, then twice a day for 3d. 20 tablet 0   docusate sodium  (COLACE) 100 MG capsule TAKE 1 CAPSULE BY MOUTH TWICE A DAY 60 capsule 0   DULoxetine  (CYMBALTA ) 20 MG capsule Take 40 mg by mouth in the morning.     furosemide  (LASIX ) 20 MG tablet Take 20 mg by mouth in the morning.     isosorbide mononitrate (IMDUR) 60 MG 24 hr tablet Take 60 mg by mouth at bedtime.     levothyroxine  (SYNTHROID ) 112 MCG tablet Take 112 mcg by mouth every other day. TAKE ONE TABLET EVERY OTHER DAY, ALTERNATING WITH 125 MCG DOSE     levothyroxine  (SYNTHROID ) 125 MCG tablet Take 125 mcg by mouth every other day. TAKE ONE TABLET EVERY OTHER DAY, ALTERNATING WITH 112 MCG DOSE     liothyronine  (CYTOMEL ) 5 MCG tablet Take 5 mcg by mouth daily before breakfast.     Magnesium  250 MG TABS Take 200 mg by mouth every evening.     metoprolol tartrate (LOPRESSOR) 25 MG tablet Take 25 mg by mouth 2 (two) times daily.     montelukast (SINGULAIR) 10 MG tablet Take 10 mg by mouth at bedtime.     Multiple Vitamin (MULTIVITAMIN WITH MINERALS) TABS tablet Take 1 tablet by mouth every evening.     OZEMPIC, 0.25 OR 0.5 MG/DOSE, 2 MG/3ML SOPN Inject 0.25 mg into the skin every Monday.     pantoprazole  (PROTONIX ) 40 MG tablet Take 40 mg by mouth daily before breakfast.     predniSONE  (DELTASONE ) 5 MG tablet Take 5 mg by mouth in the morning.     QUEtiapine (SEROQUEL) 25 MG tablet Take 25 mg by mouth at bedtime.     rOPINIRole  (REQUIP ) 2 MG tablet Take 2 mg by mouth in the morning, at noon, and at bedtime.     spironolactone (ALDACTONE) 25 MG tablet Take 12.5 mg by mouth  every  evening.     TRELEGY ELLIPTA 100-62.5-25 MCG/ACT AEPB Inhale 1 puff into the lungs daily.     Turmeric 500 MG CAPS Take 500 mg by mouth every evening.     Vibegron  (GEMTESA ) 75 MG TABS Take 1 tablet (75 mg total) by mouth daily. 90 tablet 3   acetaminophen  (TYLENOL ) 500 MG tablet Take 500 mg by mouth every 6 (six) hours as needed (pain.). (Patient not taking: Reported on 08/08/2023)     [START ON 08/11/2023] oxyCODONE  (OXY IR/ROXICODONE ) 5 MG immediate release tablet Take 1 tablet (5 mg total) by mouth every 6 (six) hours as needed for severe pain (pain score 7-10). 30 tablet 0   No current facility-administered medications for this visit.     PHYSICAL EXAMINATION: ECOG PERFORMANCE STATUS: 1 - Symptomatic but completely ambulatory Vitals:   08/08/23 1209  BP: (!) 116/54  Pulse: 73  Resp: 18  Temp: (!) 97.5 F (36.4 C)  SpO2: 95%   Filed Weights   08/08/23 1209  Weight: 152 lb 3.2 oz (69 kg)    Physical Exam Constitutional:      General: She is not in acute distress.    Comments: Frail appearance female  HENT:     Head: Normocephalic and atraumatic.   Eyes:     General: No scleral icterus.   Cardiovascular:     Rate and Rhythm: Normal rate and regular rhythm.  Pulmonary:     Effort: Pulmonary effort is normal. No respiratory distress.     Breath sounds: No wheezing.  Abdominal:     General: Bowel sounds are normal.     Palpations: Abdomen is soft. There is no mass.     Tenderness: There is no abdominal tenderness.   Musculoskeletal:        General: No deformity. Normal range of motion.     Cervical back: Normal range of motion and neck supple.   Skin:    General: Skin is warm and dry.     Findings: No erythema or rash.   Neurological:     Mental Status: She is alert and oriented to person, place, and time. Mental status is at baseline.   Psychiatric:        Mood and Affect: Mood normal.      LABORATORY DATA:  I have reviewed the data as listed Lab  Results  Component Value Date   WBC 5.9 07/31/2023   HGB 12.3 07/31/2023   HCT 36.9 07/31/2023   MCV 94.9 07/31/2023   PLT 223 07/31/2023   Recent Labs    07/14/23 1226 07/28/23 1459 07/30/23 1405  NA 135 136 136  K 3.5 4.1 4.2  CL 97* 95* 97*  CO2 26 29 28   GLUCOSE 95 113* 103*  BUN 36* 39* 34*  CREATININE 1.35* 1.36* 1.61*  CALCIUM 9.0 8.7* 9.1  GFRNONAA 38* 37* 31*  PROT  --  7.7  --   ALBUMIN  --  3.9  --   AST  --  25  --   ALT  --  22  --   ALKPHOS  --  67  --   BILITOT  --  0.9  --

## 2023-08-09 ENCOUNTER — Other Ambulatory Visit: Payer: Self-pay

## 2023-08-11 ENCOUNTER — Other Ambulatory Visit: Payer: Self-pay | Admitting: Oncology

## 2023-08-11 ENCOUNTER — Other Ambulatory Visit: Payer: Self-pay

## 2023-08-11 ENCOUNTER — Encounter (HOSPITAL_COMMUNITY): Payer: Self-pay | Admitting: Oncology

## 2023-08-11 ENCOUNTER — Telehealth: Payer: Self-pay | Admitting: Oncology

## 2023-08-11 NOTE — Telephone Encounter (Signed)
 Pt daughter is requesting Friday appts for pt.  Can you update the tx dates in IS from 7/7 to 7/11 and update 7/14 to 7/18.  Thank you

## 2023-08-11 NOTE — Progress Notes (Signed)
 Pharmacist Chemotherapy Monitoring - Initial Assessment    Anticipated start date: 08/15/23   The following has been reviewed per standard work regarding the patient's treatment regimen: The patient's diagnosis, treatment plan and drug doses, and organ/hematologic function Lab orders and baseline tests specific to treatment regimen  The treatment plan start date, drug sequencing, and pre-medications Prior authorization status  Patient's documented medication list, including drug-drug interaction screen and prescriptions for anti-emetics and supportive care specific to the treatment regimen The drug concentrations, fluid compatibility, administration routes, and timing of the medications to be used The patient's access for treatment and lifetime cumulative dose history, if applicable  The patient's medication allergies and previous infusion related reactions, if applicable   Changes made to treatment plan:  N/A  Follow up needed:  N/A   Glendora Landsman, PharmD, BCPS Clinical Pharmacist   08/11/2023  1:53 PM

## 2023-08-12 ENCOUNTER — Encounter: Payer: Self-pay | Admitting: Dermatology

## 2023-08-12 ENCOUNTER — Ambulatory Visit: Payer: 59 | Admitting: Dermatology

## 2023-08-12 DIAGNOSIS — L729 Follicular cyst of the skin and subcutaneous tissue, unspecified: Secondary | ICD-10-CM

## 2023-08-12 DIAGNOSIS — L578 Other skin changes due to chronic exposure to nonionizing radiation: Secondary | ICD-10-CM

## 2023-08-12 DIAGNOSIS — Z85828 Personal history of other malignant neoplasm of skin: Secondary | ICD-10-CM

## 2023-08-12 DIAGNOSIS — L859 Epidermal thickening, unspecified: Secondary | ICD-10-CM | POA: Diagnosis not present

## 2023-08-12 DIAGNOSIS — L570 Actinic keratosis: Secondary | ICD-10-CM

## 2023-08-12 DIAGNOSIS — W908XXA Exposure to other nonionizing radiation, initial encounter: Secondary | ICD-10-CM | POA: Diagnosis not present

## 2023-08-12 DIAGNOSIS — L720 Epidermal cyst: Secondary | ICD-10-CM

## 2023-08-12 DIAGNOSIS — L309 Dermatitis, unspecified: Secondary | ICD-10-CM

## 2023-08-12 NOTE — Patient Instructions (Addendum)
Cryotherapy Aftercare  Wash gently with soap and water everyday.   Apply Vaseline and Band-Aid daily until healed.    Recommend daily broad spectrum sunscreen SPF 30+ to sun-exposed areas, reapply every 2 hours as needed. Call for new or changing lesions.  Staying in the shade or wearing long sleeves, sun glasses (UVA+UVB protection) and wide brim hats (4-inch brim around the entire circumference of the hat) are also recommended for sun protection.    Due to recent changes in healthcare laws, you may see results of your pathology and/or laboratory studies on MyChart before the doctors have had a chance to review them. We understand that in some cases there may be results that are confusing or concerning to you. Please understand that not all results are received at the same time and often the doctors may need to interpret multiple results in order to provide you with the best plan of care or course of treatment. Therefore, we ask that you please give [place] 2 business days to thoroughly review all your results before contacting the office for clarification. Should we see a critical lab result, you will be contacted sooner.   If You Need Anything After Your Visit  If you have any questions or concerns for your doctor, please call our main line at [phone] and press option 4 to reach your doctor's medical assistant. If no one answers, please leave a voicemail as directed and we will return your call as soon as possible. Messages left after 4 pm will be answered the following business day.   You may also send [place] a message via MyChart. We typically respond to MyChart messages within 1-2 business days.  For prescription refills, please ask your pharmacy to contact our office. Our fax number is [phone].  If you have an urgent issue when the clinic is closed that cannot wait until the next business day, you can page your doctor at the number below.    Please note that while we do our best to be  available for urgent issues outside of office hours, we are not available 24/7.   If you have an urgent issue and are unable to reach [place], you may choose to seek medical care at your doctor's office, retail clinic, urgent care center, or emergency room.  If you have a medical emergency, please immediately call 911 or go to the emergency department.  Pager Numbers  - Dr. Gwen Pounds: [phone]  - Dr. Roseanne Reno: [phone]  - Dr. Katrinka Blazing: [phone]   In the event of inclement weather, please call our main line at [phone] for an update on the status of any delays or closures.  Dermatology Medication Tips: Please keep the boxes that topical medications come in in order to help keep track of the instructions about where and how to use these. Pharmacies typically print the medication instructions only on the boxes and not directly on the medication tubes.   If your medication is too expensive, please contact our office at [phone] option 4 or send [place] a message through MyChart.   We are unable to tell what your co-pay for medications will be in advance as this is different depending on your insurance coverage. However, we may be able to find a substitute medication at lower cost or fill out paperwork to get insurance to cover a needed medication.   If a prior authorization is required to get your medication covered by your insurance company, please allow [place] 1-2 business days to complete this process.  Drug prices often vary depending on where the prescription is filled and some pharmacies may offer cheaper prices.  The website www.goodrx.com contains coupons for medications through different pharmacies. The prices here do not account for what the cost may be with help from insurance (it may be cheaper with your insurance), but the website can give you the price if you did not use any insurance.  - You can print the associated coupon and take it with your prescription to the pharmacy.   - You may also stop by our office during regular business hours and pick up a GoodRx coupon card.  - If you need your prescription sent electronically to a different pharmacy, notify our office through [facility] or by phone at [phone] option 4.     Si Usted Necesita Algo Despus de Su Visita  Tambin puede enviarnos un mensaje a travs de Clinical cytogeneticist. Por lo general respondemos a los mensajes de MyChart en el transcurso de 1 a 2 das hbiles.  Para renovar recetas, por favor pida a su farmacia que se ponga en contacto con nuestra oficina. Annie Sable de fax es [place] [phone].  Si tiene un asunto urgente cuando la clnica est cerrada y que no puede esperar hasta el siguiente da hbil, puede llamar/localizar a su doctor(a) al nmero que aparece a continuacin.   Por favor, tenga en cuenta que aunque hacemos todo lo posible para estar disponibles para asuntos urgentes fuera del horario de [place], no estamos disponibles las 24 horas del da, los 7 [address]  [address] de la [place].   Si tiene un problema urgente y no puede comunicarse con nosotros, puede optar por buscar atencin mdica  en el consultorio de su doctor(a), en una clnica privada, en un centro de atencin urgente o en una sala de emergencias.  Si tiene Engineer, drilling, por favor llame inmediatamente al 911 o vaya a la sala de emergencias.  Nmeros de bper  - Dr. Gwen Pounds: [phone]  - Dra. Roseanne Reno: [phone]  - Dr. Katrinka Blazing: [phone]   En caso de inclemencias del tiempo, por favor llame a Lacy Duverney principal al [phone] para una actualizacin sobre el [place] de cualquier retraso o cierre.  Consejos para la medicacin en dermatologa: Por favor, guarde las cajas en las que vienen los medicamentos de uso tpico para ayudarle a seguir las instrucciones sobre dnde y cmo usarlos. Las farmacias generalmente imprimen las instrucciones del medicamento slo en las cajas y no directamente en los tubos del  [place].   Si su medicamento es muy caro, por favor, pngase en contacto con Rolm Gala llamando al [phone] y presione la opcin 4 o envenos un mensaje a travs de Clinical cytogeneticist.   No podemos decirle cul ser su copago por los medicamentos por adelantado ya que esto es diferente dependiendo de la cobertura de su seguro. Sin embargo, es posible que podamos encontrar un medicamento sustituto a Audiological scientist un formulario para que el seguro cubra el medicamento que se considera necesario.   Si se requiere una autorizacin previa para que su compaa de seguros [place] su medicamento, por favor permtanos de 1 a 2 das hbiles para completar [address]  Los precios de los medicamentos varan con frecuencia dependiendo del Environmental consultant de dnde se surte la receta y alguna farmacias pueden ofrecer precios ms baratos.  El sitio web www.goodrx.com tiene cupones para medicamentos de Health and safety inspector. Los precios aqu no tienen en cuenta lo que podra costar con la ayuda del seguro (puede ser  ms barato con su seguro), pero el sitio web puede darle el precio si no Visual merchandiser.  - Puede imprimir el cupn correspondiente y llevarlo con su receta a la farmacia.  - Tambin puede pasar por nuestra oficina durante el horario de atencin regular y Education officer, museum una tarjeta de cupones de GoodRx.  - Si necesita que su receta se enve electrnicamente a una farmacia diferente, informe a nuestra oficina a travs de MyChart de [place] o por telfono llamando al [phone] y presione la opcin 4.

## 2023-08-12 NOTE — Progress Notes (Signed)
 Follow-Up Visit   Subjective  [name] is a 89 y.o. female who presents for the following:  6 month Actinic keratosis/ ISK recheck, L upper pretibia x 1, R lat mid back x 1, L temple hairline x 1, R lower calf x 3 (6) . Pt. wants spot checked on nose and left knee.   Hands and feet have improved. Using Aquaphor.   The patient has spots, moles and lesions to be evaluated, some may be new or changing and the patient may have concern these could be cancer.  **Lymphoma recurrence. Treatments will start Friday 08/15/2023.**  Daughter is with patient and contributes to history.   The following portions of the chart were reviewed this encounter and updated as appropriate: medications, allergies, medical history  Review of Systems:  No other skin or systemic complaints except as noted in HPI or Assessment and Plan.  Objective  Well appearing patient in no apparent distress; mood and affect are within normal limits.  A focused examination was performed of the following areas: Face, back, legs  Relevant exam findings are noted in the Assessment and Plan.  nasal dorsum x1, left nasal tipx1, left malar cheekx1 (3) Erythematous thin papules/macules with gritty scale.   Assessment & Plan   EPIDERMAL INCLUSION CYST Exam: firm depressed nodule with punctum at left medial knee.   Benign-appearing. Exam most consistent with an epidermal inclusion cyst. Discussed that a cyst is a benign growth that can grow over time and sometimes get irritated or inflamed. Recommend observation if it is not bothersome. Discussed option of surgical excision to remove it if it is growing, symptomatic, or other changes noted. Please call for new or changing lesions so they can be evaluated.   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 Currently well-controlled.    Hand Dermatitis is a  chronic type of eczema that can come and go on the hands and fingers.  While there is no cure, the rash and symptoms can be managed with topical prescription medications, and for more severe cases, with systemic medications.  Recommend mild soap and routine use of moisturizing cream after handwashing.  Minimize soap/water exposure when possible.      Treatment Plan: Continue tacrolimus  ointment Apply to hands and feet 1-2 times a day until improved Continue clobetasol  ointment once to twice daily as needed to more severe areas and prn flares. Avoid applying to face, groin, and axilla. Use as directed. Long-term use can cause thinning of the skin. Minimize soap/water exposure to hands.  Recommend mild soap and moisturizing cream 1-2 times daily. Pt is using Aquaphor with good result.    HISTORY OF SQUAMOUS CELL CARCINOMA OF THE SKIN. - No evidence of recurrence today- L pretibia. Bx 06/13/2020. - Recommend regular full body skin exams - Recommend daily broad spectrum sunscreen SPF 30+ to sun-exposed areas, reapply every 2 hours as needed.  - Call if any new or changing lesions are noted between office visits  HISTORY OF BASAL CELL CARCINOMA OF THE SKIN  - No evidence of recurrence today- right inferior knee (10/2017) - Recommend regular full body skin exams - Recommend daily broad spectrum sunscreen SPF 30+ to sun-exposed areas, reapply every 2 hours as needed.  - Call if any new or changing lesions are noted between office visits   AK (ACTINIC KERATOSIS) (3) nasal dorsum x1, left nasal tipx1, left malar cheekx1 (3)  Vs ISK   Actinic keratoses are precancerous spots that appear secondary to cumulative UV radiation exposure/sun exposure over time. They are chronic with expected duration over 1 year. A portion of actinic keratoses will progress to squamous cell carcinoma of the skin. It is not possible to reliably predict which spots will progress to skin cancer and so treatment is recommended to  prevent development of skin cancer.  Recommend daily broad spectrum sunscreen SPF 30+ to sun-exposed areas, reapply every 2 hours as needed.  Recommend staying in the shade or wearing long sleeves, sun glasses (UVA+UVB protection) and wide brim hats (4-inch brim around the entire circumference of the hat). Call for new or changing lesions. Destruction of lesion - nasal dorsum x1, left nasal tipx1, left malar cheekx1 (3)  Destruction method: cryotherapy   Informed consent: discussed and consent obtained   Lesion destroyed using liquid nitrogen: Yes   Region frozen until ice ball extended beyond lesion: Yes   Outcome: patient tolerated procedure well with no complications   Post-procedure details: wound care instructions given   Additional details:  Prior to procedure, discussed risks of blister formation, small wound, skin dyspigmentation, or rare scar following cryotherapy. Recommend Vaseline ointment to treated areas while healing.    ACTINIC DAMAGE - chronic, secondary to cumulative UV radiation exposure/sun exposure over time - diffuse scaly erythematous macules with underlying dyspigmentation - Recommend daily broad spectrum sunscreen SPF 30+ to sun-exposed areas, reapply every 2 hours as needed.  - Recommend staying in the shade or wearing long sleeves, sun glasses (UVA+UVB protection) and wide brim hats (4-inch brim around the entire circumference of the hat). - Call for new or changing lesions.    Return in about 6 months (around 02/11/2024) for TBSE.  I, Darcie Easterly, CMA, am acting as scribe for Artemio Larry, MD.   Documentation: I have reviewed the above documentation for accuracy and completeness, and I agree with the above.  Artemio Larry, MD

## 2023-08-13 ENCOUNTER — Other Ambulatory Visit: Payer: Self-pay

## 2023-08-13 ENCOUNTER — Encounter (HOSPITAL_COMMUNITY): Payer: Self-pay | Admitting: Oncology

## 2023-08-15 ENCOUNTER — Inpatient Hospital Stay

## 2023-08-15 VITALS — BP 95/58 | HR 80 | Temp 98.7°F | Resp 20 | Wt 151.6 lb

## 2023-08-15 DIAGNOSIS — Z5112 Encounter for antineoplastic immunotherapy: Secondary | ICD-10-CM | POA: Diagnosis not present

## 2023-08-15 DIAGNOSIS — C8590 Non-Hodgkin lymphoma, unspecified, unspecified site: Secondary | ICD-10-CM

## 2023-08-15 LAB — CMP (CANCER CENTER ONLY)
ALT: 29 U/L (ref 0–44)
AST: 26 U/L (ref 15–41)
Albumin: 3.8 g/dL (ref 3.5–5.0)
Alkaline Phosphatase: 66 U/L (ref 38–126)
Anion gap: 10 (ref 5–15)
BUN: 55 mg/dL — ABNORMAL HIGH (ref 8–23)
CO2: 27 mmol/L (ref 22–32)
Calcium: 8.3 mg/dL — ABNORMAL LOW (ref 8.9–10.3)
Chloride: 95 mmol/L — ABNORMAL LOW (ref 98–111)
Creatinine: 1.37 mg/dL — ABNORMAL HIGH (ref 0.44–1.00)
GFR, Estimated: 37 mL/min — ABNORMAL LOW (ref >60)
Glucose, Bld: 89 mg/dL (ref 70–99)
Potassium: 4.1 mmol/L (ref 3.5–5.1)
Sodium: 132 mmol/L — ABNORMAL LOW (ref 135–145)
Total Bilirubin: 0.9 mg/dL (ref 0.0–1.2)
Total Protein: 6.8 g/dL (ref 6.5–8.1)

## 2023-08-15 LAB — CBC (CANCER CENTER ONLY)
HCT: 39.3 % (ref 36.0–46.0)
Hemoglobin: 12.9 g/dL (ref 12.0–15.0)
MCH: 30.8 pg (ref 26.0–34.0)
MCHC: 32.8 g/dL (ref 30.0–36.0)
MCV: 93.8 fL (ref 80.0–100.0)
Platelet Count: 235 10*3/uL (ref 150–400)
RBC: 4.19 MIL/uL (ref 3.87–5.11)
RDW: 15 % (ref 11.5–15.5)
WBC Count: 9.2 10*3/uL (ref 4.0–10.5)
nRBC: 0 % (ref 0.0–0.2)

## 2023-08-15 MED ORDER — ACETAMINOPHEN 325 MG PO TABS
650.0000 mg | ORAL_TABLET | Freq: Once | ORAL | Status: AC
  Administered 2023-08-15: 650 mg via ORAL
  Filled 2023-08-15: qty 2

## 2023-08-15 MED ORDER — SODIUM CHLORIDE 0.9 % IV SOLN
375.0000 mg/m2 | Freq: Once | INTRAVENOUS | Status: AC
  Administered 2023-08-15: 600 mg via INTRAVENOUS
  Filled 2023-08-15: qty 10

## 2023-08-15 MED ORDER — DIPHENHYDRAMINE HCL 25 MG PO CAPS
50.0000 mg | ORAL_CAPSULE | Freq: Once | ORAL | Status: AC
  Administered 2023-08-15: 50 mg via ORAL
  Filled 2023-08-15: qty 2

## 2023-08-15 MED ORDER — SODIUM CHLORIDE 0.9 % IV SOLN
INTRAVENOUS | Status: DC
  Filled 2023-08-15: qty 250

## 2023-08-15 NOTE — Patient Instructions (Signed)
 Rituximab Injection What is this medication? RITUXIMAB (ri TUX i mab) treats leukemia and lymphoma. It works by blocking a protein that causes cancer cells to grow and multiply. This helps to slow or stop the spread of cancer cells. It may also be used to treat autoimmune conditions, such as arthritis. It works by slowing down an overactive immune system. It is a monoclonal antibody. This medicine may be used for other purposes; ask your health care provider or pharmacist if you have questions. COMMON BRAND NAME(S): RIABNI, Rituxan, RUXIENCE, truxima What should I tell my care team before I take this medication? They need to know if you have any of these conditions: Chest pain Heart disease Immune system problems Infection, such as chickenpox, cold sores, hepatitis B, herpes Irregular heartbeat or rhythm Kidney disease Low blood counts, such as low white cells, platelets, red cells Lung disease Recent or upcoming vaccine An unusual or allergic reaction to rituximab, other medications, foods, dyes, or preservatives Pregnant or trying to get pregnant Breast-feeding How should I use this medication? This medication is injected into a vein. It is given by a care team in a hospital or clinic setting. A special MedGuide will be given to you before each treatment. Be sure to read this information carefully each time. Talk to your care team about the use of this medication in children. While this medication may be prescribed for children as young as 6 months for selected conditions, precautions do apply. Overdosage: If you think you have taken too much of this medicine contact a poison control center or emergency room at once. NOTE: This medicine is only for you. Do not share this medicine with others. What if I miss a dose? Keep appointments for follow-up doses. It is important not to miss your dose. Call your care team if you are unable to keep an appointment. What may interact with this  medication? Do not take this medication with any of the following: Live vaccines This medication may also interact with the following: Cisplatin This list may not describe all possible interactions. Give your health care provider a list of all the medicines, herbs, non-prescription drugs, or dietary supplements you use. Also tell them if you smoke, drink alcohol, or use illegal drugs. Some items may interact with your medicine. What should I watch for while using this medication? Your condition will be monitored carefully while you are receiving this medication. You may need blood work while taking this medication. This medication can cause serious infusion reactions. To reduce the risk your care team may give you other medications to take before receiving this one. Be sure to follow the directions from your care team. This medication may increase your risk of getting an infection. Call your care team for advice if you get a fever, chills, sore throat, or other symptoms of a cold or flu. Do not treat yourself. Try to avoid being around people who are sick. Call your care team if you are around anyone with measles, chickenpox, or if you develop sores or blisters that do not heal properly. Avoid taking medications that contain aspirin, acetaminophen, ibuprofen, naproxen, or ketoprofen unless instructed by your care team. These medications may hide a fever. This medication may cause serious skin reactions. They can happen weeks to months after starting the medication. Contact your care team right away if you notice fevers or flu-like symptoms with a rash. The rash may be red or purple and then turn into blisters or peeling of the skin.  You may also notice a red rash with swelling of the face, lips, or lymph nodes in your neck or under your arms. In some patients, this medication may cause a serious brain infection that may cause death. If you have any problems seeing, thinking, speaking, walking, or  standing, tell your care team right away. If you cannot reach your care team, urgently seek another source of medical care. Talk to your care team if you may be pregnant. Serious birth defects can occur if you take this medication during pregnancy and for 12 months after the last dose. You will need a negative pregnancy test before starting this medication. Contraception is recommended while taking this medication and for 12 months after the last dose. Your care team can help you find the option that works for you. Do not breastfeed while taking this medication and for at least 6 months after the last dose. What side effects may I notice from receiving this medication? Side effects that you should report to your care team as soon as possible: Allergic reactions or angioedema--skin rash, itching or hives, swelling of the face, eyes, lips, tongue, arms, or legs, trouble swallowing or breathing Bowel blockage--stomach cramping, unable to have a bowel movement or pass gas, loss of appetite, vomiting Dizziness, loss of balance or coordination, confusion or trouble speaking Heart attack--pain or tightness in the chest, shoulders, arms, or jaw, nausea, shortness of breath, cold or clammy skin, feeling faint or lightheaded Heart rhythm changes--fast or irregular heartbeat, dizziness, feeling faint or lightheaded, chest pain, trouble breathing Infection--fever, chills, cough, sore throat, wounds that don't heal, pain or trouble when passing urine, general feeling of discomfort or being unwell Infusion reactions--chest pain, shortness of breath or trouble breathing, feeling faint or lightheaded Kidney injury--decrease in the amount of urine, swelling of the ankles, hands, or feet Liver injury--right upper belly pain, loss of appetite, nausea, light-colored stool, dark yellow or brown urine, yellowing skin or eyes, unusual weakness or fatigue Redness, blistering, peeling, or loosening of the skin, including  inside the mouth Stomach pain that is severe, does not go away, or gets worse Tumor lysis syndrome (TLS)--nausea, vomiting, diarrhea, decrease in the amount of urine, dark urine, unusual weakness or fatigue, confusion, muscle pain or cramps, fast or irregular heartbeat, joint pain Side effects that usually do not require medical attention (report to your care team if they continue or are bothersome): Headache Joint pain Nausea Runny or stuffy nose Unusual weakness or fatigue This list may not describe all possible side effects. Call your doctor for medical advice about side effects. You may report side effects to FDA at 1-800-FDA-1088. Where should I keep my medication? This medication is given in a hospital or clinic. It will not be stored at home. NOTE: This sheet is a summary. It may not cover all possible information. If you have questions about this medicine, talk to your doctor, pharmacist, or health care provider.  2024 Elsevier/Gold Standard (2021-07-05 00:00:00)

## 2023-08-18 NOTE — Progress Notes (Signed)
 Rapid Infusion Rituximab  Pharmacist Evaluation  [name] is a 89 y.o. female being treated with rituximab  for non-Hodgkin's lymphoma . This patient may be considered for RIR.   A pharmacist has verified the patient tolerated rituximab  infusions per the [facility] standard infusion protocol without grade 3-4 infusion reactions. The treatment plan will be updated to reflect RIR if the patient qualifies per the checklist below:   Age > 74 years old Yes   Clinically significant cardiovascular disease No   Circulating lymphocyte count < 5000/uL prior to cycle two Yes  Lab Results  Component Value Date   LYMPHSABS 1.1 07/31/2023    Prior documented grade 3-4 infusion reaction to rituximab  No   Prior documented grade 1-2 infusion reaction to rituximab  (If YES, Pharmacist will confirm with Physician if patient is still a candidate for RIR) No   Previous rituximab  infusion within the past 6 months Yes   Treatment Plan updated orders to reflect RIR Yes    [name] does meet the criteria for Rapid Infusion Rituximab . This patient is going to be switched to rapid infusion rituximab .   [name], PharmD, BCPS Clinical Pharmacist   08/18/23 2:20 PM

## 2023-08-21 ENCOUNTER — Ambulatory Visit (INDEPENDENT_AMBULATORY_CARE_PROVIDER_SITE_OTHER): Admitting: Physician Assistant

## 2023-08-21 DIAGNOSIS — N3946 Mixed incontinence: Secondary | ICD-10-CM

## 2023-08-21 NOTE — Progress Notes (Signed)
 PTNS  Session # 36  Health & Social Factors: Started rituximab  for lymphoma last week Caffeine: 0 Alcohol: 0 Daytime voids #per day: 6-7 Night-time voids #per night: 6-7 Urgency: mild Incontinence Episodes #per day: 1 Ankle used: right Treatment Setting: 7 Feeling/ Response: sensory Comments: Patient tolerated well  Performed By: Lucie Hones, PA-C   Follow Up: 1 month

## 2023-08-22 ENCOUNTER — Inpatient Hospital Stay

## 2023-08-22 ENCOUNTER — Inpatient Hospital Stay (HOSPITAL_BASED_OUTPATIENT_CLINIC_OR_DEPARTMENT_OTHER): Admitting: Oncology

## 2023-08-22 ENCOUNTER — Telehealth: Payer: Self-pay

## 2023-08-22 ENCOUNTER — Ambulatory Visit

## 2023-08-22 VITALS — BP 102/48 | HR 61

## 2023-08-22 VITALS — BP 117/73 | HR 80 | Temp 97.3°F | Resp 18 | Wt 152.4 lb

## 2023-08-22 DIAGNOSIS — C8590 Non-Hodgkin lymphoma, unspecified, unspecified site: Secondary | ICD-10-CM

## 2023-08-22 DIAGNOSIS — Z5112 Encounter for antineoplastic immunotherapy: Secondary | ICD-10-CM | POA: Diagnosis not present

## 2023-08-22 DIAGNOSIS — G893 Neoplasm related pain (acute) (chronic): Secondary | ICD-10-CM | POA: Insufficient documentation

## 2023-08-22 DIAGNOSIS — N1832 Chronic kidney disease, stage 3b: Secondary | ICD-10-CM

## 2023-08-22 LAB — CMP (CANCER CENTER ONLY)
ALT: 22 U/L (ref 0–44)
AST: 23 U/L (ref 15–41)
Albumin: 3.8 g/dL (ref 3.5–5.0)
Alkaline Phosphatase: 64 U/L (ref 38–126)
Anion gap: 8 (ref 5–15)
BUN: 46 mg/dL — ABNORMAL HIGH (ref 8–23)
CO2: 29 mmol/L (ref 22–32)
Calcium: 8.5 mg/dL — ABNORMAL LOW (ref 8.9–10.3)
Chloride: 97 mmol/L — ABNORMAL LOW (ref 98–111)
Creatinine: 1.28 mg/dL — ABNORMAL HIGH (ref 0.44–1.00)
GFR, Estimated: 40 mL/min — ABNORMAL LOW (ref >60)
Glucose, Bld: 98 mg/dL (ref 70–99)
Potassium: 4 mmol/L (ref 3.5–5.1)
Sodium: 134 mmol/L — ABNORMAL LOW (ref 135–145)
Total Bilirubin: 0.8 mg/dL (ref 0.0–1.2)
Total Protein: 6.8 g/dL (ref 6.5–8.1)

## 2023-08-22 LAB — CBC (CANCER CENTER ONLY)
HCT: 37.5 % (ref 36.0–46.0)
Hemoglobin: 12.3 g/dL (ref 12.0–15.0)
MCH: 31.2 pg (ref 26.0–34.0)
MCHC: 32.8 g/dL (ref 30.0–36.0)
MCV: 95.2 fL (ref 80.0–100.0)
Platelet Count: 198 10*3/uL (ref 150–400)
RBC: 3.94 MIL/uL (ref 3.87–5.11)
RDW: 14.9 % (ref 11.5–15.5)
WBC Count: 7.8 10*3/uL (ref 4.0–10.5)
nRBC: 0 % (ref 0.0–0.2)

## 2023-08-22 MED ORDER — SODIUM CHLORIDE 0.9 % IV SOLN
375.0000 mg/m2 | Freq: Once | INTRAVENOUS | Status: AC
  Administered 2023-08-22: 600 mg via INTRAVENOUS
  Filled 2023-08-22: qty 10

## 2023-08-22 MED ORDER — SODIUM CHLORIDE 0.9 % IV SOLN
INTRAVENOUS | Status: DC
  Filled 2023-08-22: qty 250

## 2023-08-22 MED ORDER — DIPHENHYDRAMINE HCL 25 MG PO CAPS
50.0000 mg | ORAL_CAPSULE | Freq: Once | ORAL | Status: AC
  Administered 2023-08-22: 50 mg via ORAL
  Filled 2023-08-22: qty 2

## 2023-08-22 MED ORDER — OXYCODONE HCL 5 MG PO TABS
5.0000 mg | ORAL_TABLET | Freq: Four times a day (QID) | ORAL | 0 refills | Status: DC | PRN
Start: 2023-08-22 — End: 2023-12-09

## 2023-08-22 MED ORDER — ACETAMINOPHEN 325 MG PO TABS
650.0000 mg | ORAL_TABLET | Freq: Once | ORAL | Status: AC
  Administered 2023-08-22: 650 mg via ORAL
  Filled 2023-08-22: qty 2

## 2023-08-22 NOTE — Patient Instructions (Signed)
 [facility] - A DEPT OF [facility]  Discharge Instructions: Thank you for choosing [facility] to provide your oncology and hematology care.  If you have a lab appointment with the Cancer Center, please go directly to the Cancer Center and check in at the registration area.  Wear comfortable clothing and clothing appropriate for easy access to any Portacath or PICC line.   We strive to give you quality time with your provider. You may need to reschedule your appointment if you arrive late (15 or more minutes).  Arriving late affects you and other patients whose appointments are after yours.  Also, if you miss three or more appointments without notifying the office, you may be dismissed from the clinic at the provider's discretion.      For prescription refill requests, have your pharmacy contact our office and allow 72 hours for refills to be completed.    Today you received the following chemotherapy and/or immunotherapy agents Ruxience      To help prevent nausea and vomiting after your treatment, we encourage you to take your nausea medication as directed.  BELOW ARE SYMPTOMS THAT SHOULD BE REPORTED IMMEDIATELY: *FEVER GREATER THAN 100.4 F (38 C) OR HIGHER *CHILLS OR SWEATING *NAUSEA AND VOMITING THAT IS NOT CONTROLLED WITH YOUR NAUSEA MEDICATION *UNUSUAL SHORTNESS OF BREATH *UNUSUAL BRUISING OR BLEEDING *URINARY PROBLEMS (pain or burning when urinating, or frequent urination) *BOWEL PROBLEMS (unusual diarrhea, constipation, pain near the anus) TENDERNESS IN MOUTH AND THROAT WITH OR WITHOUT PRESENCE OF ULCERS (sore throat, sores in mouth, or a toothache) UNUSUAL RASH, SWELLING OR PAIN  UNUSUAL VAGINAL DISCHARGE OR ITCHING   Items with * indicate a potential emergency and should be followed up as soon as possible or go to the Emergency Department if any problems should occur.  Please show the CHEMOTHERAPY ALERT CARD or IMMUNOTHERAPY  ALERT CARD at check-in to the Emergency Department and triage nurse.  Should you have questions after your visit or need to cancel or reschedule your appointment, please contact [facility] - A DEPT OF [facility]  [phone] and follow the prompts.  Office hours are 8:00 a.m. to 4:30 p.m. Monday - Friday. Please note that voicemails left after 4:00 p.m. may not be returned until the following business day.  We are closed weekends and major holidays. You have access to a nurse at all times for urgent questions. Please call the main number to the clinic [phone] and follow the prompts.  For any non-urgent questions, you may also contact your provider using MyChart. We now offer e-Visits for anyone 55 and older to request care online for non-urgent symptoms. For details visit [email or web address].   Also download the MyChart app! Go to the app store, search "MyChart", open the app, select [place], and log in with your MyChart username and password.

## 2023-08-22 NOTE — Progress Notes (Signed)
 Pt here for follow up. Pt reports that she had back pain last week after treatment, but it resolved later.

## 2023-08-22 NOTE — Assessment & Plan Note (Signed)
 Encourage oral hydration and avoid nephrotoxins.

## 2023-08-22 NOTE — Progress Notes (Addendum)
 Hematology/Oncology Progress note [phone] [phone]      Patient Care Team: Sadie Manna, MD as PCP - General (Internal Medicine) Babara Call, MD as Consulting Physician (Oncology)  REFERRING PROVIDER: Dr. Sadie CHIEF COMPLAINTS/REASON FOR VISIT:  Follow up for lymphoma.  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 Previously remote history of follicular lymphoma. I discussed with pathology.   lymphoma NGS showed  negative BCL6, CCND1, IgH.BCL2 and MALT1. Overall clinically patient has stage IV non-Hodgkin's lymphoma, low-grade She is symptomatic. She tolerated cycle 1 Rituximab . Abdominal pain/bloating improved.  Recommend Ritxumiab weekly x 3.    Chronic kidney disease (CKD), stage III (moderate) (HCC) Encourage oral hydration and avoid nephrotoxins.    Neoplasm related pain Recommend oxycodone  5mg  Q8 hour PRN.    Orders Placed This Encounter  Procedures   CBC (Cancer Center Only)    Standing Status:   Future    Expected Date:   09/05/2023    Expiration Date:   09/04/2024   CBC (Cancer Center Only)    Standing Status:   Future    Expected Date:   09/12/2023    Expiration Date:   09/11/2024   CMP (Cancer Center only)    Standing Status:   Future    Expected Date:   09/12/2023    Expiration Date:   09/11/2024   CMP (Cancer Center only)    Standing Status:   Future    Expected Date:   10/22/2023    Expiration Date:   01/20/2024   CBC with Differential (Cancer Center Only)    Standing Status:   Future    Expected Date:   10/22/2023    Expiration Date:   01/20/2024   Lactate dehydrogenase    Standing Status:   Future    Expected Date:   10/22/2023    Expiration Date:   01/20/2024   Follow-up 2 months.  All questions were answered. The patient knows to call the clinic with any problems, questions  or concerns. We spent sufficient time to discuss many aspect of care, questions were answered to patient's satisfaction.  Call Babara, MD, PhD [facility] Hematology Oncology 08/22/2023   HISTORY OF PRESENTING ILLNESS:  [name] is a  89 y.o.  female with PMH listed below who was referred to me for evaluation of neck swelling, history of lymphoma. Patient reports remote history of lymphoma many years ago.  She recalls that she received chemotherapy at that time.  Followed up with Dr. Sheryn who retired from our cancer center.  She was recently seen by PCP and has complained about her concerns of bilateral neck swelling for a while. She could not specify when she noticed this change. She reports feeling anxious as her initial symptoms which led to lymphoma diagnosis was neck LN swelling.   obtained patient's previous records in EMR by Dr.Choski. Patient has history of non hodgkin's lymphoma and s/p chemotherapy treatment. Her pathology was not available to me.   # 12/08/2017 US  thryoid done which showed left mid thyroid  nodule, 2.2 cm x 2.1cm x 2.1cm.  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 Afirma GSC benign, ROM 4%.  Patient was previously  referred to endocrinology for further management, and appears that patient never established care there.   09/21/2019, patient was evaluated in emergency room for confusion.  Due to the long wait time, patient left without seeing physician in the  ED.  Patient had chest x-ray and CT head without contrast along with lab work.  Chest x-ray was negative and CT was negative for acute abnormality.  UA reviewed you urinary tract infection.  Patient presents for additional lab work and during waiting, patient was found to be confused and talk to herself.  She was approached by nursing staff and escorted to examining room for evaluation.  Patient was seen by symptom management nurse practitioner Delon.  Lab work also showed AKI with  creatinine increased to 1.5. Patient was treated with supportive care with IV fluids nitrofurantoin  for 7 days for UTI.  Urine culture lateral positive for E. Coli  INTERVAL HISTORY [name] is a 89 y.o. female who has above history reviewed by me today presents for follow up visit for history of lymphoma.  Pathological compression, paraspinal soft tissue mass. Accompanied by daughter [name] who is also patient's power of attorney. Patient reports not feeling good.  She has constant abdominal bloating/hard belly. 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  Oncology History  Non-Hodgkin lymphoma (HCC)  06/05/2023 Imaging   CT abdomen pelvis with contrast showed Narrative & Impression  CLINICAL DATA:  Right-sided abdominal pain, history of non-Hodgkin lymphoma   EXAM: CT ABDOMEN AND PELVIS WITH CONTRAST   TECHNIQUE: Multidetector CT imaging of the abdomen and pelvis was performed using the standard protocol following bolus administration of intravenous contrast.   RADIATION DOSE REDUCTION: This exam was performed according to the departmental dose-optimization program which includes automated exposure control, adjustment of the mA and/or kV according to patient size and/or use of iterative reconstruction technique.   CONTRAST:  80mL OMNIPAQUE  IOHEXOL  300 MG/ML  SOLN   COMPARISON:  04/28/2020   FINDINGS: Lower chest: No acute pleural or parenchymal lung disease.   Hepatobiliary: No focal liver abnormality is seen. No gallstones, gallbladder wall thickening, or biliary dilatation.   Pancreas: Unremarkable. No pancreatic ductal dilatation or surrounding inflammatory changes.   Spleen: Normal in size without focal abnormality.   Adrenals/Urinary Tract: Mild bilateral renal cortical atrophy. No urinary tract calculi or obstructive uropathy. The adrenals and bladder are unremarkable.   Stomach/Bowel: No bowel obstruction or ileus. Diverticulosis of  the descending and sigmoid colon. No evidence of acute diverticulitis. No bowel wall thickening or inflammatory change.   Vascular/Lymphatic: Aortic atherosclerosis. No enlarged abdominal or pelvic lymph nodes.   Reproductive: Status post hysterectomy. No adnexal masses.   Other: No free fluid or free intraperitoneal gas. No abdominal wall hernia.   Musculoskeletal: There is an age-indeterminate T7 compression deformity, with greater than 50% loss of height. Mild paraspinal soft tissue swelling at this location suggests acute to subacute fracture. While there is no significant retropulsion, there does appear to be soft tissue density causing significant central canal stenosis. If further evaluation is desired, MRI may be useful.   No other acute bony abnormalities. Severe spondylosis and facet hypertrophy within the lower lumbar spine, most pronounced at L4-5 with significant central canal stenosis. This is a chronic stable finding. Reconstructed images demonstrate no additional findings.   IMPRESSION: 1. Likely acute to subacute T7 compression fracture, with greater than 50% loss of height. Prominent paraspinal soft tissue swelling is noted, as well as soft tissue density in the central canal likely contributing to significant central canal stenosis. Further evaluation with MRI may be useful. 2. Distal colonic diverticulosis without diverticulitis. 3.  Aortic Atherosclerosis (ICD10-I70.0).     06/16/2023 Imaging   MRI thoracic spine with and without contrast  showed Tumor involving the T6-T9 vertebrae and paraspinal soft tissues with associated severe pathologic T7 compression fracture and epidural tumor resulting in moderate spinal stenosis.     07/04/2023 Initial Diagnosis   Non-Hodgkin lymphoma (HCC)  Patient reports remote history of non-Hodgkin lymphoma which was treated by Dr. Sheryn.  Limited previous records available to me indicates no Ultracaine's lymphoma, mixed  small and large cell type, grade 2/3, stage IV, status post rituximab -CHOP.  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  07/04/2023, patient presented to emergency room due to findings of abnormal CT findings. Paraspinal tumor involving T6/T9 vertebrae with associated severe compression fracture of the T7, moderate spinal stenosis.  Patient was seen by Dr. Clista during the hospitalization.  Patient was recommended to proceed with biopsy by IR outpatient.  07/14/2023 status post IR biopsy of T8 vertebral body Pathology showed atypical lymphoid infiltrate consistent with B-cell lymphoproliferative disorder.Morphologic evaluation of the bone biopsy reveals a monotonous atypical lymphoid infiltrate comprised of small to medium lymphocytes with monocytoid appearance.  Immunohistochemically, the atypical cells are positive for CD20, PAX5, CD79a and BCL2.  CD3 and CD5 highlight background T lymphocytes. CD10 and BCL6 highlight residual germinal centers.  CD138 and mum 1 highlight plasma cells.  TdT, CD34 and cyclin D1 are negative in the atypical cells.  CD43 appears to be positive in a subset of the atypical cells.  Kappa/lambda IHC are  noncontributory.  Overall the findings are consistent with a B-cell     lymphoproliferative disorder, particularly marginal zone lymphoma.  However amore definitive assessment is precluded by lack of clonality on  immunohistochemical staining and lack of separate tissue for flow cytometric analysis.  A lymphoma NGS panel will be performed and results will be reported in an addendum.      07/04/2023 Imaging   CT chest abdomen pelvis with contrast 1. Similar severe compression of T7 with surrounding paraspinal soft tissue thickening, better evaluated on prior MRI. 2. Unchanged 4 mm right apical nodule, likely benign. 2 mm bilateral lower lobe nodules, not definitely seen on prior examination, indeterminate. 3. No evidence of  metastatic disease in the abdomen or pelvis. 4. Aortic Atherosclerosis (ICD10-I70.0). Coronary artery calcifications. Assessment for potential risk factor modification, dietary therapy or pharmacologic therapy may be warranted, if clinically indicated.     07/28/2023 Imaging   PET scan showed 1. Hypermetabolic lymphoma involving abdominal retroperitoneal and right pelvic lymph nodes as well as midthoracic spine (Deauville 5). 2. Hypermetabolic soft tissue thickening deep to the upper abdominal midline ventral wall, possibly due to lymphoma. 3. Punctate right renal stone. 4. Chronic calcific pancreatitis. 5. Aortic atherosclerosis (ICD10-I70.0). Coronary artery calcification.   07/31/2023 Bone Marrow Biopsy   Bone marrow biopsy -  Overall normocellular bone marrow with orderly trilineage hematopoiesis.  PERIPHERAL BLOOD: -  Unremarkable smear review.  Note: It is noted the patient has a remote history of a lymphoma (2002 follicular lymphoma per discussion with Dr. Babara with a recent (07/14/2023) diagnosis of an atypical lymphoid infiltrate consistent with a B-cell lymphoproliferative disorder of the T8 vertebral body.  Lymphocytes are not increased on the aspirate smear slides as well as by immunohistochemical stains of the clot section; however, the clot section on the HE stained slide did highlight a couple small well-circumscribed lymphoid aggregates that were lost on levels and the IHC stains.  These well-circumscribed lymphoid aggregates, however, are within the realm of reactive lymphoid aggregates given their circumscription and age of patient.  Definitive bone marrow involvement  by a lymphoproliferative disorder is not identified morphologically or immunohistochemically.    08/04/2023 Cancer Staging   Staging form: Hodgkin and Non-Hodgkin Lymphoma, AJCC 8th Edition - Clinical stage from 08/04/2023: Stage IV (Follicular lymphoma) - Signed by Babara Call, MD on 08/04/2023 Stage prefix:  Recurrence   08/15/2023 -  Chemotherapy   Patient is on Treatment Plan : NON-HODGKINS LYMPHOMA Rituximab  q7d      Obtained previous medical records. She had left axillary lymph node excisional biopsy in February 2002.  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  04/24/2020, bone biopsy showed involvement of malignant lymphoma.  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 Patient received R-CHOP treatments and radiation.  Today patient is s/p Rituximab  x 1. She tolerated well.  Abdominal pain/bloating is better.  Back pain after treatment.  Weight is stable. Appetite is fair.    Review of Systems  Constitutional:  Positive for fatigue. Negative for appetite change, chills, fever and unexpected weight change.  HENT:   Negative for hearing loss and voice change.   Eyes:  Negative for eye problems.  Respiratory:  Negative for chest tightness and cough.   Cardiovascular:  Negative for chest pain.  Gastrointestinal:  Negative for abdominal distention, abdominal pain and blood in stool.       Bloating  Endocrine: Negative for hot flashes.  Genitourinary:  Negative for difficulty urinating and frequency.   Musculoskeletal:  Negative for arthralgias.  Skin:  Negative for itching and rash.  Neurological:  Negative for extremity weakness.  Hematological:  Negative for adenopathy.  Psychiatric/Behavioral:  Negative for confusion.      MEDICAL HISTORY:  Past Medical History:  Diagnosis Date   Afib (HCC)    a.) CHA2DS2-VASc = 6 (age x 2, sex, CHF, HTN, aortic plaque). b.) rate/rhythm maintained without pharmacological intervention; she is not on oral anticoagulation   Anxiety    Aortic atherosclerosis (HCC)    Arthritis    Basal cell carcinoma 11/11/2017   Right inferior knee. Superficial and nodular patterns.   Bilateral lower extremity edema    Chest pain 07/30/2023   CKD (chronic  kidney disease), stage III (HCC)    Congestive heart failure (CHF) (HCC) 05/31/2014   a.) TTE 05/31/2014: EF >55%, RVE; LAE; triv panvalvular regurg; G1DD. b.) TTE 06/29/2017: EF >55%; RVE; BAE; G1DD. c.) TTE 05/09/2018: EF 55-60%; LAE; mod MR, mild AR/TR. d.) TTE 04/29/2020: EF 60-65%; mi;d-mod MR/TR; RVSP 36.6 mmHg. e.) TTE 08/24/2020: EF >55%; mild LVH; mild MR/TR/PR; sev MAC.   COPD (chronic obstructive pulmonary disease) (HCC)    Coronary artery disease 06/03/2008   a.) LHC 06/03/2008: 40% pLAD-1, 60% pLAD-2, 90% mLAD-1, 30% mLAD-2, 50% mLAD-3 --> PCI placing a 2.0 x 12 mm Mini-Vision BMS x 1 to mLAD-1 lesion. b.) LHC 02/12/10: 50% pLAD, 30% ISR mLAD, 25% dLAD, 25% pLCx, 30% mLCx, 20% pRCA, 20% D1; further intervention deferred opting for medical mgmt.   DDD (degenerative disc disease), lumbar    Depression    Dyspnea 07/30/2023   GERD (gastroesophageal reflux disease)    HOH (hard of hearing)    a.) uses BILATERAL hearing aids   Hurthle cell neoplasm of thyroid     a.) s/p total thyroidectomy 11/17/2019   Hyperlipemia    Hypertension    Hypothyroidism    Lumbar spinal stenosis    Memory loss    Non Hodgkin's lymphoma (HCC) 2002   Squamous cell  carcinoma of skin 06/13/2020   L mid pretibia, biopsy only   Urinary incontinence     SURGICAL HISTORY: Past Surgical History:  Procedure Laterality Date   CORONARY ANGIOPLASTY WITH STENT PLACEMENT Left 06/03/2008   Procedure: CORONARY ANGIOPLASTY WITH STENT PLACEMENT; Location: [facility]; Surgeon(s): Wolm Rhyme, MD (diagnostic) and Margie Lovelace, MD (interventional)   EXCISION OF ABDOMINAL WALL TUMOR Right 05/08/2018   Procedure: EXCISION OF ABDOMINAL WALL-RIGHT;  Surgeon: Rodolph Romano, MD;  Location: [facility];  Service: General;  Laterality: Right;   INCISIONAL HERNIA REPAIR N/A 05/08/2018   Procedure: LAPAROSCOPIC VS. OPEN INCISIONAL HERNIA REPAIR WITH MESH;  Surgeon: Rodolph Romano, MD;  Location: [facility];  Service:  General;  Laterality: N/A;   IR BONE MARROW BIOPSY & ASPIRATION  07/31/2023   IR CT BONE TROCAR/NEEDLE BIOPSY DEEP  07/14/2023   LEFT HEART CATH AND CORONARY ANGIOGRAPHY Left 02/12/2010   Procedure: LEFT HEART CATH AND CORONARY ANGIOGRAPHY; Location: [facility]; Surgeon: Wolm Rhyme, MD   LESION REMOVAL Left 08/22/2021   Procedure: LESION REMOVAL (KNEE);  Surgeon: Jama Cordella MATSU, MD;  Location: [facility];  Service: Vascular;  Laterality: Left;   RIGHT/LEFT HEART CATH AND CORONARY ANGIOGRAPHY Bilateral 06/13/2023   Procedure: RIGHT/LEFT HEART CATH AND CORONARY ANGIOGRAPHY;  Surgeon: Lovelace Cara BIRCH, MD;  Location: [facility];  Service: Cardiovascular;  Laterality: Bilateral;   THYROIDECTOMY Bilateral 11/17/2019   Procedure: TOTAL THYROIDECTOMY;  Surgeon: Edda Mt, MD;  Location: [facility];  Service: ENT;  Laterality: Bilateral;   TOTAL ABDOMINAL HYSTERECTOMY N/A    VEIN SURGERY     Endovenous ablation of saphenous vein    SOCIAL HISTORY: Social History   Socioeconomic History   Marital status: Single    Spouse name: Not on file   Number of children: Not on file   Years of education: Not on file   Highest education level: Not on file  Occupational History   Occupation: retired  Tobacco Use   Smoking status: Never    Passive exposure: Never   Smokeless tobacco: Never  Vaping Use   Vaping status: Never Used  Substance and Sexual Activity   Alcohol use: No   Drug use: No   Sexual activity: Not on file  Other Topics Concern   Not on file  Social History Narrative   Lives at home independently. Has a cane to ambulate occasionally. Continues to drive.   Social Drivers of Corporate investment banker Strain: Low Risk  (04/22/2023)   Received from [facility]   Overall Financial Resource Strain (CARDIA)    Difficulty of Paying Living Expenses: Not hard at all  Food Insecurity: No Food Insecurity (07/04/2023)   Hunger Vital Sign    Worried About  Running Out of Food in the Last Year: Never true    Ran Out of Food in the Last Year: Never true  Transportation Needs: No Transportation Needs (07/04/2023)   PRAPARE - Administrator, Civil Service (Medical): No    Lack of Transportation (Non-Medical): No  Physical Activity: Not on file  Stress: Not on file  Social Connections: Socially Isolated (07/04/2023)   Social Connection and Isolation Panel    Frequency of Communication with Friends and Family: More than three times a week    Frequency of Social Gatherings with Friends and Family: More than three times a week    Attends Religious Services: Never    Database administrator or Organizations: No    Attends [employer]  or Organization Meetings: Never    Marital Status: Widowed  Intimate Partner Violence: Not At Risk (07/04/2023)   Humiliation, Afraid, Rape, and Kick questionnaire    Fear of Current or Ex-Partner: No    Emotionally Abused: No    Physically Abused: No    Sexually Abused: No    FAMILY HISTORY: Family History  Problem Relation Age of Onset   Heart Problems Mother    Heart Problems Father    Heart attack Father    Diabetes Brother    Diabetes Brother     ALLERGIES:  is allergic to cefdinir, pregabalin, and sulfa antibiotics.  MEDICATIONS:  Current Outpatient Medications  Medication Sig Dispense Refill   acetaminophen  (TYLENOL ) 500 MG tablet Take 500 mg by mouth every 6 (six) hours as needed (pain.).     albuterol  (PROVENTIL ) (2.5 MG/3ML) 0.083% nebulizer solution Take 2.5 mg by nebulization in the morning and at bedtime.     albuterol  (VENTOLIN  HFA) 108 (90 Base) MCG/ACT inhaler Inhale 2 puffs into the lungs 2 (two) times daily.     Calcium Citrate-Vitamin D (CALCIUM CITRATE + D3 PO) Take 1 tablet by mouth in the morning and at bedtime.     Cyanocobalamin (VITAMIN B 12 PO) Take 2,500 mcg by mouth in the morning.     docusate sodium  (COLACE) 100 MG capsule TAKE 1 CAPSULE BY MOUTH TWICE A DAY 60 capsule 0    DULoxetine  (CYMBALTA ) 20 MG capsule Take 40 mg by mouth in the morning.     furosemide  (LASIX ) 20 MG tablet Take 20 mg by mouth in the morning.     isosorbide mononitrate (IMDUR) 60 MG 24 hr tablet Take 60 mg by mouth at bedtime.     levothyroxine  (SYNTHROID ) 112 MCG tablet Take 112 mcg by mouth every other day. TAKE ONE TABLET EVERY OTHER DAY, ALTERNATING WITH 125 MCG DOSE     levothyroxine  (SYNTHROID ) 125 MCG tablet Take 125 mcg by mouth every other day. TAKE ONE TABLET EVERY OTHER DAY, ALTERNATING WITH 112 MCG DOSE     liothyronine  (CYTOMEL ) 5 MCG tablet Take 5 mcg by mouth daily before breakfast. (Patient taking differently: Take 2.5 mcg by mouth daily before breakfast.)     Magnesium  250 MG TABS Take 200 mg by mouth every evening.     metoprolol tartrate (LOPRESSOR) 25 MG tablet Take 25 mg by mouth 2 (two) times daily.     montelukast (SINGULAIR) 10 MG tablet Take 10 mg by mouth at bedtime.     Multiple Vitamin (MULTIVITAMIN WITH MINERALS) TABS tablet Take 1 tablet by mouth every evening.     OZEMPIC, 0.25 OR 0.5 MG/DOSE, 2 MG/3ML SOPN Inject 0.25 mg into the skin every Monday.     pantoprazole  (PROTONIX ) 40 MG tablet Take 40 mg by mouth daily before breakfast.     predniSONE  (DELTASONE ) 5 MG tablet Take 5 mg by mouth in the morning.     QUEtiapine (SEROQUEL) 25 MG tablet Take 25 mg by mouth at bedtime.     rOPINIRole  (REQUIP ) 2 MG tablet Take 2 mg by mouth in the morning, at noon, and at bedtime.     spironolactone (ALDACTONE) 25 MG tablet Take 12.5 mg by mouth every evening.     TRELEGY ELLIPTA 100-62.5-25 MCG/ACT AEPB Inhale 1 puff into the lungs daily.     Turmeric 500 MG CAPS Take 500 mg by mouth every evening.     Vibegron  (GEMTESA ) 75 MG TABS Take 1 tablet (75 mg total)  by mouth daily. 90 tablet 3   oxyCODONE  (OXY IR/ROXICODONE ) 5 MG immediate release tablet Take 1 tablet (5 mg total) by mouth every 6 (six) hours as needed for severe pain (pain score 7-10). 30 tablet 0   No current  facility-administered medications for this visit.   Facility-Administered Medications Ordered in Other Visits  Medication Dose Route Frequency Provider Last Rate Last Admin   0.9 %  sodium chloride  infusion   Intravenous Continuous Babara Call, MD 10 mL/hr at 08/22/23 0917 New Bag at 08/22/23 0917     PHYSICAL EXAMINATION: ECOG PERFORMANCE STATUS: 1 - Symptomatic but completely ambulatory Vitals:   08/22/23 0835  BP: 117/73  Pulse: 80  Resp: 18  Temp: (!) 97.3 F (36.3 C)   Filed Weights   08/22/23 0835  Weight: 152 lb 6.4 oz (69.1 kg)    Physical Exam Constitutional:      General: She is not in acute distress.    Comments: Frail appearance female  HENT:     Head: Normocephalic and atraumatic.   Eyes:     General: No scleral icterus.   Cardiovascular:     Rate and Rhythm: Normal rate and regular rhythm.  Pulmonary:     Effort: Pulmonary effort is normal. No respiratory distress.     Breath sounds: No wheezing.  Abdominal:     General: Bowel sounds are normal.     Palpations: Abdomen is soft. There is no mass.     Tenderness: There is no abdominal tenderness.   Musculoskeletal:        General: No deformity. Normal range of motion.     Cervical back: Normal range of motion and neck supple.   Skin:    General: Skin is warm and dry.     Findings: No erythema or rash.   Neurological:     Mental Status: She is alert and oriented to person, place, and time. Mental status is at baseline.   Psychiatric:        Mood and Affect: Mood normal.      LABORATORY DATA:  I have reviewed the data as listed Lab Results  Component Value Date   WBC 7.8 08/22/2023   HGB 12.3 08/22/2023   HCT 37.5 08/22/2023   MCV 95.2 08/22/2023   PLT 198 08/22/2023   Recent Labs    07/28/23 1459 07/30/23 1405 08/15/23 0841 08/22/23 0819  NA 136 136 132* 134*  K 4.1 4.2 4.1 4.0  CL 95* 97* 95* 97*  CO2 29 28 27 29   GLUCOSE 113* 103* 89 98  BUN 39* 34* 55* 46*  CREATININE 1.36*  1.61* 1.37* 1.28*  CALCIUM 8.7* 9.1 8.3* 8.5*  GFRNONAA 37* 31* 37* 40*  PROT 7.7  --  6.8 6.8  ALBUMIN 3.9  --  3.8 3.8  AST 25  --  26 23  ALT 22  --  29 22  ALKPHOS 67  --  66 64  BILITOT 0.9  --  0.9 0.8

## 2023-08-22 NOTE — Assessment & Plan Note (Signed)
 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 Previously remote history of follicular lymphoma. I discussed with pathology.   lymphoma NGS showed  negative BCL6, CCND1, IgH.BCL2 and MALT1. Overall clinically patient has stage IV non-Hodgkin's lymphoma, low-grade She is symptomatic. She tolerated cycle 1 Rituximab . Abdominal pain/bloating improved.  Recommend Ritxumiab weekly x 3.

## 2023-08-22 NOTE — Assessment & Plan Note (Signed)
 Recommend oxycodone  5mg  Q8 hour PRN.

## 2023-08-22 NOTE — Telephone Encounter (Signed)
 Spoke to Dr. Frutoso and she states she will look and addend it on Monday, as she is gone for the day.

## 2023-08-22 NOTE — Telephone Encounter (Signed)
-----   Message from Zelphia Cap sent at 08/22/2023  1:55 PM EDT ----- Please reach out to pathology Dr. Frutoso, ask if she would addendum the diagnosis after reviewing lymphoma NGS.  Also please let her know that pt has a history of follicular lymphoma. Thanks.  Session number [phone] Thanks  zy

## 2023-08-27 ENCOUNTER — Other Ambulatory Visit: Payer: Self-pay | Admitting: Oncology

## 2023-09-01 ENCOUNTER — Inpatient Hospital Stay: Attending: Oncology | Admitting: Nurse Practitioner

## 2023-09-01 ENCOUNTER — Encounter: Payer: Self-pay | Admitting: Nurse Practitioner

## 2023-09-01 ENCOUNTER — Ambulatory Visit

## 2023-09-01 ENCOUNTER — Telehealth: Payer: Self-pay | Admitting: *Deleted

## 2023-09-01 VITALS — BP 108/63 | HR 72 | Temp 98.9°F | Resp 18 | Ht 60.0 in | Wt 155.0 lb

## 2023-09-01 DIAGNOSIS — C8290 Follicular lymphoma, unspecified, unspecified site: Secondary | ICD-10-CM | POA: Insufficient documentation

## 2023-09-01 DIAGNOSIS — B372 Candidiasis of skin and nail: Secondary | ICD-10-CM | POA: Diagnosis not present

## 2023-09-01 DIAGNOSIS — Z5112 Encounter for antineoplastic immunotherapy: Secondary | ICD-10-CM | POA: Insufficient documentation

## 2023-09-01 MED ORDER — NYSTATIN 100000 UNIT/GM EX POWD: 1.0000 | Freq: Three times a day (TID) | CUTANEOUS | 1 refills | Status: AC

## 2023-09-01 NOTE — Telephone Encounter (Signed)
 Returned phone call to patient's daughter. Offered virtual visit or in person visit to evaluate pt's concerns under her breast. Per daughter, pt's intranet is down and she would prefer pt to come in this afternoon. Daughter can bring her here at 3pm today for check.   Msg sent to scheduling to add patient at 3pm today for smc with Lauren

## 2023-09-01 NOTE — Telephone Encounter (Signed)
 Call returned to daughter who stated patient has pain under her left breast since Thursday and now has a rash.  Daughter reports patient started Cipro  on Wednesday for a place on her leg after seeing PCP but she is concerned about the pain she continues to have in breast.

## 2023-09-01 NOTE — Progress Notes (Signed)
 Symptom Management Clinic  [facility] A Department of the [facility]. [facility] [address], [address] [place], [place] [address] [phone] (phone) [phone] (fax)  Patient Care Team: Sadie Manna, MD as PCP - General (Internal Medicine) Babara Call, MD as Consulting Physician (Oncology)   Name of the patient: [name]  [id number]  May 27, 1932   Date of visit: 09/01/23  Diagnosis- Non-Hodgkin Lymphoma  Chief complaint/ Reason for visit- Rash under breast & groin  Heme/Onc History:  Oncology History  Non-Hodgkin lymphoma (HCC)  06/05/2023 Imaging   CT abdomen pelvis with contrast showed Narrative & Impression  CLINICAL DATA:  Right-sided abdominal pain, history of non-Hodgkin lymphoma   EXAM: CT ABDOMEN AND PELVIS WITH CONTRAST   TECHNIQUE: Multidetector CT imaging of the abdomen and pelvis was performed using the standard protocol following bolus administration of intravenous contrast.   RADIATION DOSE REDUCTION: This exam was performed according to the departmental dose-optimization program which includes automated exposure control, adjustment of the mA and/or kV according to patient size and/or use of iterative reconstruction technique.   CONTRAST:  80mL OMNIPAQUE  IOHEXOL  300 MG/ML  SOLN   COMPARISON:  04/28/2020   FINDINGS: Lower chest: No acute pleural or parenchymal lung disease.   Hepatobiliary: No focal liver abnormality is seen. No gallstones, gallbladder wall thickening, or biliary dilatation.   Pancreas: Unremarkable. No pancreatic ductal dilatation or surrounding inflammatory changes.   Spleen: Normal in size without focal abnormality.   Adrenals/Urinary Tract: Mild bilateral renal cortical atrophy. No urinary tract calculi or obstructive uropathy. The adrenals and bladder are unremarkable.   Stomach/Bowel: No bowel obstruction or ileus. Diverticulosis of the descending and  sigmoid colon. No evidence of acute diverticulitis. No bowel wall thickening or inflammatory change.   Vascular/Lymphatic: Aortic atherosclerosis. No enlarged abdominal or pelvic lymph nodes.   Reproductive: Status post hysterectomy. No adnexal masses.   Other: No free fluid or free intraperitoneal gas. No abdominal wall hernia.   Musculoskeletal: There is an age-indeterminate T7 compression deformity, with greater than 50% loss of height. Mild paraspinal soft tissue swelling at this location suggests acute to subacute fracture. While there is no significant retropulsion, there does appear to be soft tissue density causing significant central canal stenosis. If further evaluation is desired, MRI may be useful.   No other acute bony abnormalities. Severe spondylosis and facet hypertrophy within the lower lumbar spine, most pronounced at L4-5 with significant central canal stenosis. This is a chronic stable finding. Reconstructed images demonstrate no additional findings.   IMPRESSION: 1. Likely acute to subacute T7 compression fracture, with greater than 50% loss of height. Prominent paraspinal soft tissue swelling is noted, as well as soft tissue density in the central canal likely contributing to significant central canal stenosis. Further evaluation with MRI may be useful. 2. Distal colonic diverticulosis without diverticulitis. 3.  Aortic Atherosclerosis (ICD10-I70.0).     06/16/2023 Imaging   MRI thoracic spine with and without contrast showed Tumor involving the T6-T9 vertebrae and paraspinal soft tissues with associated severe pathologic T7 compression fracture and epidural tumor resulting in moderate spinal stenosis.     07/04/2023 Initial Diagnosis   Non-Hodgkin lymphoma (HCC)  Patient reports remote history of non-Hodgkin lymphoma which was treated by Dr. Sheryn.  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  07/04/2023, patient presented to emergency room due to findings of abnormal CT findings. Paraspinal tumor involving T6/T9 vertebrae with associated severe compression fracture of the T7, moderate spinal stenosis.  Patient was seen by Dr. Clista during the hospitalization.  Patient was recommended to proceed with biopsy by IR outpatient.  07/14/2023 status post IR biopsy of T8 vertebral body Pathology showed atypical lymphoid infiltrate consistent with B-cell lymphoproliferative disorder.Morphologic evaluation of the bone biopsy reveals a monotonous atypical lymphoid infiltrate comprised of small to medium lymphocytes with monocytoid appearance.  Immunohistochemically, the atypical cells are positive for CD20, PAX5, CD79a and BCL2.  CD3 and CD5 highlight background T lymphocytes. CD10 and BCL6 highlight residual germinal centers.  CD138 and mum 1 highlight plasma cells.  TdT, CD34 and cyclin D1 are negative in the atypical cells.  CD43 appears to be positive in a subset of the atypical cells.  Kappa/lambda IHC are  noncontributory.  Overall the findings are consistent with a B-cell     lymphoproliferative disorder, particularly marginal zone lymphoma.  However amore definitive assessment is precluded by lack of clonality on  immunohistochemical staining and lack of separate tissue for flow cytometric analysis.  A lymphoma NGS panel will be performed and results will be reported in an addendum.      07/04/2023 Imaging   CT chest abdomen pelvis with contrast 1. Similar severe compression of T7 with surrounding paraspinal soft tissue thickening, better evaluated on prior MRI. 2. Unchanged 4 mm right apical nodule, likely benign. 2 mm bilateral lower lobe nodules, not definitely seen on prior examination, indeterminate. 3. No evidence of metastatic disease in the  abdomen or pelvis. 4. Aortic Atherosclerosis (ICD10-I70.0). Coronary artery calcifications. Assessment for potential risk factor modification, dietary therapy or pharmacologic therapy may be warranted, if clinically indicated.     07/28/2023 Imaging   PET scan showed 1. Hypermetabolic lymphoma involving abdominal retroperitoneal and right pelvic lymph nodes as well as midthoracic spine (Deauville 5). 2. Hypermetabolic soft tissue thickening deep to the upper abdominal midline ventral wall, possibly due to lymphoma. 3. Punctate right renal stone. 4. Chronic calcific pancreatitis. 5. Aortic atherosclerosis (ICD10-I70.0). Coronary artery calcification.   07/31/2023 Bone Marrow Biopsy   Bone marrow biopsy -  Overall normocellular bone marrow with orderly trilineage hematopoiesis.  PERIPHERAL BLOOD: -  Unremarkable smear review.  Note: It is noted the patient has a remote history of a lymphoma (2002 follicular lymphoma per discussion with Dr. Babara with a recent (07/14/2023) diagnosis of an atypical lymphoid infiltrate consistent with a B-cell lymphoproliferative disorder of the T8 vertebral body.  Lymphocytes are not increased on the aspirate smear slides as well as by immunohistochemical stains of the clot section; however, the clot section on the HE stained slide did highlight a couple small well-circumscribed lymphoid aggregates that were lost on levels and the IHC stains.  These well-circumscribed lymphoid aggregates, however, are within the realm of reactive lymphoid aggregates given their circumscription and age of patient.  Definitive bone marrow involvement by a lymphoproliferative disorder is not identified morphologically or immunohistochemically.    08/04/2023 Cancer Staging   Staging form: Hodgkin and Non-Hodgkin Lymphoma, AJCC 8th Edition - Clinical stage from 08/04/2023: Stage IV (Follicular lymphoma) - Signed by Babara Call, MD on 08/04/2023 Stage prefix: Recurrence   08/15/2023 -   Chemotherapy   Patient is on Treatment Plan : NON-HODGKINS LYMPHOMA Rituximab  q7d       Interval history- ELY SPRAGG is a 89 y.o. female who presents to symptom management clinic  for complaints of discoloration, irritation, and tenderness under her left breast. Has been present for a few days. Incidentally noted. Does not seem to be worsening.   Review of systems- Review of Systems  Constitutional:  Negative for malaise/fatigue.  Skin:  Positive for rash. Negative for itching.  Neurological:  Negative for dizziness and headaches.    Allergies  Allergen Reactions   Cefdinir Other (See Comments)    Tolerated cefazolin  2020   Pregabalin Other (See Comments)    Vision loss   Sulfa Antibiotics Hives and Rash   Past Medical History:  Diagnosis Date   Afib (HCC)    a.) CHA2DS2-VASc = 6 (age x 2, sex, CHF, HTN, aortic plaque). b.) rate/rhythm maintained without pharmacological intervention; she is not on oral anticoagulation   Anxiety    Aortic atherosclerosis (HCC)    Arthritis    Basal cell carcinoma 11/11/2017   Right inferior knee. Superficial and nodular patterns.   Bilateral lower extremity edema    Chest pain 07/30/2023   CKD (chronic kidney disease), stage III (HCC)    Congestive heart failure (CHF) (HCC) 05/31/2014   a.) TTE 05/31/2014: EF >55%, RVE; LAE; triv panvalvular regurg; G1DD. b.) TTE 06/29/2017: EF >55%; RVE; BAE; G1DD. c.) TTE 05/09/2018: EF 55-60%; LAE; mod MR, mild AR/TR. d.) TTE 04/29/2020: EF 60-65%; mi;d-mod MR/TR; RVSP 36.6 mmHg. e.) TTE 08/24/2020: EF >55%; mild LVH; mild MR/TR/PR; sev MAC.   COPD (chronic obstructive pulmonary disease) (HCC)    Coronary artery disease 06/03/2008   a.) LHC 06/03/2008: 40% pLAD-1, 60% pLAD-2, 90% mLAD-1, 30% mLAD-2, 50% mLAD-3 --> PCI placing a 2.0 x 12 mm Mini-Vision BMS x 1 to mLAD-1 lesion. b.) LHC 02/12/10: 50% pLAD, 30% ISR mLAD, 25% dLAD, 25% pLCx, 30% mLCx, 20% pRCA, 20% D1; further intervention deferred opting for  medical mgmt.   DDD (degenerative disc disease), lumbar    Depression    Dyspnea 07/30/2023   GERD (gastroesophageal reflux disease)    HOH (hard of hearing)    a.) uses BILATERAL hearing aids   Hurthle cell neoplasm of thyroid     a.) s/p total thyroidectomy 11/17/2019   Hyperlipemia    Hypertension    Hypothyroidism    Lumbar spinal stenosis    Memory loss    Non Hodgkin's lymphoma (HCC) 2002   Squamous cell carcinoma of skin 06/13/2020   L mid pretibia, biopsy only   Urinary incontinence    Past Surgical History:  Procedure Laterality Date   CORONARY ANGIOPLASTY WITH STENT PLACEMENT Left 06/03/2008   Procedure: CORONARY ANGIOPLASTY WITH STENT PLACEMENT; Location: [facility]; Surgeon(s): Wolm Rhyme, MD (diagnostic) and Margie Lovelace, MD (interventional)   EXCISION OF ABDOMINAL WALL TUMOR Right 05/08/2018   Procedure: EXCISION OF ABDOMINAL WALL-RIGHT;  Surgeon: Rodolph Romano, MD;  Location: [facility] ORS;  Service: General;  Laterality: Right;   INCISIONAL HERNIA REPAIR N/A 05/08/2018   Procedure: LAPAROSCOPIC VS. OPEN INCISIONAL HERNIA REPAIR WITH MESH;  Surgeon: Rodolph Romano, MD;  Location: [facility] ORS;  Service: General;  Laterality: N/A;   IR BONE MARROW BIOPSY & ASPIRATION  07/31/2023   IR CT BONE TROCAR/NEEDLE BIOPSY DEEP  07/14/2023   LEFT HEART CATH AND CORONARY ANGIOGRAPHY Left 02/12/2010   Procedure: LEFT HEART CATH AND CORONARY ANGIOGRAPHY; Location: [facility]; Surgeon: Wolm Rhyme, MD   LESION REMOVAL Left 08/22/2021   Procedure: LESION REMOVAL (KNEE);  Surgeon: Jama Cordella MATSU, MD;  Location: [facility] ORS;  Service: Vascular;  Laterality: Left;   RIGHT/LEFT HEART CATH AND CORONARY ANGIOGRAPHY  Bilateral 06/13/2023   Procedure: RIGHT/LEFT HEART CATH AND CORONARY ANGIOGRAPHY;  Surgeon: Florencio Cara BIRCH, MD;  Location: [facility] INVASIVE CV LAB;  Service: Cardiovascular;  Laterality: Bilateral;   THYROIDECTOMY Bilateral 11/17/2019   Procedure: TOTAL THYROIDECTOMY;  Surgeon:  Edda Mt, MD;  Location: [facility] ORS;  Service: ENT;  Laterality: Bilateral;   TOTAL ABDOMINAL HYSTERECTOMY N/A    VEIN SURGERY     Endovenous ablation of saphenous vein    Social History   Socioeconomic History   Marital status: Single    Spouse name: Not on file   Number of children: Not on file   Years of education: Not on file   Highest education level: Not on file  Occupational History   Occupation: retired  Tobacco Use   Smoking status: Never    Passive exposure: Never   Smokeless tobacco: Never  Vaping Use   Vaping status: Never Used  Substance and Sexual Activity   Alcohol use: No   Drug use: No   Sexual activity: Not on file  Other Topics Concern   Not on file  Social History Narrative   Lives at home independently. Has a cane to ambulate occasionally. Continues to drive.   Social Drivers of Corporate investment banker Strain: Low Risk  (04/22/2023)   Received from [facility]   Overall Financial Resource Strain (CARDIA)    Difficulty of Paying Living Expenses: Not hard at all  Food Insecurity: No Food Insecurity (07/04/2023)   Hunger Vital Sign    Worried About Running Out of Food in the Last Year: Never true    Ran Out of Food in the Last Year: Never true  Transportation Needs: No Transportation Needs (07/04/2023)   PRAPARE - Administrator, Civil Service (Medical): No    Lack of Transportation (Non-Medical): No  Physical Activity: Not on file  Stress: Not on file  Social Connections: Socially Isolated (07/04/2023)   Social Connection and Isolation Panel    Frequency of Communication with Friends and Family: More than three times a week    Frequency of Social Gatherings with Friends and Family: More than three times a week    Attends Religious Services: Never    Database administrator or Organizations: No    Attends Banker Meetings: Never    Marital Status: Widowed  Intimate Partner Violence: Not At Risk  (07/04/2023)   Humiliation, Afraid, Rape, and Kick questionnaire    Fear of Current or Ex-Partner: No    Emotionally Abused: No    Physically Abused: No    Sexually Abused: No    Family History  Problem Relation Age of Onset   Heart Problems Mother    Heart Problems Father    Heart attack Father    Diabetes Brother    Diabetes Brother      Current Outpatient Medications:    acetaminophen  (TYLENOL ) 500 MG tablet, Take 500 mg by mouth every 6 (six) hours as needed (pain.)., Disp: , Rfl:    albuterol  (PROVENTIL ) (2.5 MG/3ML) 0.083% nebulizer solution, Take 2.5 mg by nebulization in the morning and at bedtime., Disp: , Rfl:    albuterol  (VENTOLIN  HFA) 108 (90 Base) MCG/ACT inhaler, Inhale 2 puffs into the lungs 2 (two) times daily., Disp: , Rfl:    Calcium Citrate-Vitamin D (CALCIUM CITRATE + D3 PO), Take 1 tablet by mouth in the morning and at bedtime., Disp: , Rfl:    cephALEXin  (KEFLEX ) 500 MG capsule, Take  500 mg by mouth 3 (three) times daily., Disp: , Rfl:    Cyanocobalamin (VITAMIN B 12 PO), Take 2,500 mcg by mouth in the morning., Disp: , Rfl:    docusate sodium  (COLACE) 100 MG capsule, TAKE 1 CAPSULE BY MOUTH TWICE A DAY, Disp: 60 capsule, Rfl: 0   DULoxetine  (CYMBALTA ) 20 MG capsule, Take 40 mg by mouth in the morning., Disp: , Rfl:    furosemide  (LASIX ) 20 MG tablet, Take 20 mg by mouth in the morning., Disp: , Rfl:    isosorbide mononitrate (IMDUR) 60 MG 24 hr tablet, Take 60 mg by mouth at bedtime., Disp: , Rfl:    levothyroxine  (SYNTHROID ) 125 MCG tablet, Take 125 mcg by mouth every other day. TAKE ONE TABLET EVERY OTHER DAY, ALTERNATING WITH 112 MCG DOSE, Disp: , Rfl:    liothyronine  (CYTOMEL ) 5 MCG tablet, Take 5 mcg by mouth daily before breakfast., Disp: , Rfl:    Magnesium  250 MG TABS, Take 200 mg by mouth every evening., Disp: , Rfl:    metoprolol tartrate (LOPRESSOR) 25 MG tablet, Take 25 mg by mouth 2 (two) times daily., Disp: , Rfl:    montelukast (SINGULAIR) 10 MG  tablet, Take 10 mg by mouth at bedtime., Disp: , Rfl:    Multiple Vitamin (MULTIVITAMIN WITH MINERALS) TABS tablet, Take 1 tablet by mouth every evening., Disp: , Rfl:    oxyCODONE  (OXY IR/ROXICODONE ) 5 MG immediate release tablet, Take 1 tablet (5 mg total) by mouth every 6 (six) hours as needed for severe pain (pain score 7-10)., Disp: 30 tablet, Rfl: 0   OZEMPIC, 0.25 OR 0.5 MG/DOSE, 2 MG/3ML SOPN, Inject 0.25 mg into the skin every Monday., Disp: , Rfl:    pantoprazole  (PROTONIX ) 40 MG tablet, Take 40 mg by mouth daily before breakfast., Disp: , Rfl:    predniSONE  (DELTASONE ) 5 MG tablet, Take 5 mg by mouth in the morning., Disp: , Rfl:    QUEtiapine (SEROQUEL) 25 MG tablet, Take 25 mg by mouth at bedtime., Disp: , Rfl:    rOPINIRole  (REQUIP ) 2 MG tablet, Take 2 mg by mouth in the morning, at noon, and at bedtime., Disp: , Rfl:    spironolactone (ALDACTONE) 25 MG tablet, Take 12.5 mg by mouth every evening., Disp: , Rfl:    TRELEGY ELLIPTA 100-62.5-25 MCG/ACT AEPB, Inhale 1 puff into the lungs daily., Disp: , Rfl:    Turmeric 500 MG CAPS, Take 500 mg by mouth every evening., Disp: , Rfl:    Vibegron  (GEMTESA ) 75 MG TABS, Take 1 tablet (75 mg total) by mouth daily., Disp: 90 tablet, Rfl: 3   levothyroxine  (SYNTHROID ) 112 MCG tablet, Take 112 mcg by mouth every other day. TAKE ONE TABLET EVERY OTHER DAY, ALTERNATING WITH 125 MCG DOSE, Disp: , Rfl:   Physical exam:  Vitals:   09/01/23 1500 09/01/23 1507  BP: 108/63   Pulse: 72   Resp: 18   Temp: 98.9 F (37.2 C)   TempSrc: Tympanic   Weight:  155 lb (70.3 kg)  Height:  5' (1.524 m)   Physical Exam Constitutional:      Appearance: She is not ill-appearing.  Chest:     Comments: ~3 cm area of moist erythema under breast. Nontender. No wounds.  Groin rash- suspect candidiasis as well. Moist.  Neurological:     Mental Status: She is alert and oriented to person, place, and time.  Psychiatric:        Mood and Affect: Mood normal.  Behavior: Behavior normal.     No results found.  Assessment and plan- Patient is a 89 y.o. 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 Previously remote history of follicular lymphoma. I discussed with pathology.   lymphoma NGS showed  negative BCL6, CCND1, IgH.BCL2 and MALT1. Overall clinically patient has stage IV non-Hodgkin's lymphoma, low-grade. She is symptomatic. She tolerated cycle 1 Rituximab . Abdominal pain/bloating improved. Recommend Ritxumiab weekly x 3. She is currently s/p cycle 2 on 08/22/23.  Rash- suspect fungal etiology. Recommend nystatin  powder applied to skin three times a day. If worsening symptoms, notify clinic for reevaluation. Cleanse twice a day with gentle soap and keep dry.  Drowsiness- post benadryl  with chemo. Plan to decrease dose with next infusion. Updated MD.   Disposition:  RTC if symptoms do not improve or worsen in interim- la   Visit Diagnosis 1. Skin yeast infection    Patient expressed understanding and was in agreement with this plan. She also understands that She can call clinic at any time with any questions, concerns, or complaints.   Thank you for allowing me to participate in the care of this very pleasant patient.   Tinnie Dawn, DNP, AGNP-C, AOCNP [facility] [phone]

## 2023-09-02 ENCOUNTER — Encounter: Payer: Self-pay | Admitting: Oncology

## 2023-09-05 ENCOUNTER — Inpatient Hospital Stay

## 2023-09-05 ENCOUNTER — Other Ambulatory Visit: Payer: Self-pay | Admitting: Oncology

## 2023-09-05 ENCOUNTER — Ambulatory Visit

## 2023-09-05 VITALS — BP 104/53 | HR 59 | Temp 95.0°F | Resp 24

## 2023-09-05 DIAGNOSIS — C8590 Non-Hodgkin lymphoma, unspecified, unspecified site: Secondary | ICD-10-CM

## 2023-09-05 DIAGNOSIS — Z5112 Encounter for antineoplastic immunotherapy: Secondary | ICD-10-CM | POA: Diagnosis not present

## 2023-09-05 DIAGNOSIS — C8290 Follicular lymphoma, unspecified, unspecified site: Secondary | ICD-10-CM | POA: Diagnosis present

## 2023-09-05 LAB — CBC (CANCER CENTER ONLY)
HCT: 37.3 % (ref 36.0–46.0)
Hemoglobin: 12 g/dL (ref 12.0–15.0)
MCH: 30.9 pg (ref 26.0–34.0)
MCHC: 32.2 g/dL (ref 30.0–36.0)
MCV: 96.1 fL (ref 80.0–100.0)
Platelet Count: 249 K/uL (ref 150–400)
RBC: 3.88 MIL/uL (ref 3.87–5.11)
RDW: 14.7 % (ref 11.5–15.5)
WBC Count: 5.9 K/uL (ref 4.0–10.5)
nRBC: 0 % (ref 0.0–0.2)

## 2023-09-05 MED ORDER — SODIUM CHLORIDE 0.9 % IV SOLN
375.0000 mg/m2 | Freq: Once | INTRAVENOUS | Status: AC
  Administered 2023-09-05: 600 mg via INTRAVENOUS
  Filled 2023-09-05: qty 10

## 2023-09-05 MED ORDER — DIPHENHYDRAMINE HCL 25 MG PO CAPS
50.0000 mg | ORAL_CAPSULE | Freq: Once | ORAL | Status: AC
  Administered 2023-09-05: 50 mg via ORAL

## 2023-09-05 MED ORDER — SODIUM CHLORIDE 0.9 % IV SOLN
INTRAVENOUS | Status: DC
Start: 2023-09-05 — End: 2023-09-05
  Filled 2023-09-05: qty 250

## 2023-09-05 MED ORDER — ACETAMINOPHEN 325 MG PO TABS
650.0000 mg | ORAL_TABLET | Freq: Once | ORAL | Status: AC
  Administered 2023-09-05: 650 mg via ORAL

## 2023-09-05 NOTE — Progress Notes (Signed)
 Patient here for third dose of rituximab . Prior to infusion, bp 112/56. After the infusion was completed, bp 93/44 and pt very drowsy. Pt also breathing more labored. MD notified. Pt with known CHF and COPD. Daughter at chairside stated that intermittent labored breathing was her baseline. Per MD, monitored pt for 30 additional minutes. After 30 minutes pt more alert and breathing more regular. BP 104/53. Oxygen remained 93-95% the entirety of the time in the infusion room. Per, MD ok to discharge at this time. Per MD, benadryl  dose also decreased for next infusion since pt remained drowsy three hours post the po benadryl . Pt educated to take scheduled nebulizers at home and utilize prescribed albuterol  as needed. Pt and daughter verified understanding. Pt stable at discharge.

## 2023-09-08 ENCOUNTER — Other Ambulatory Visit

## 2023-09-08 ENCOUNTER — Ambulatory Visit: Admitting: Oncology

## 2023-09-08 ENCOUNTER — Ambulatory Visit

## 2023-09-11 ENCOUNTER — Ambulatory Visit: Admitting: Oncology

## 2023-09-11 ENCOUNTER — Other Ambulatory Visit

## 2023-09-11 ENCOUNTER — Ambulatory Visit

## 2023-09-12 ENCOUNTER — Inpatient Hospital Stay

## 2023-09-12 ENCOUNTER — Ambulatory Visit

## 2023-09-12 VITALS — BP 112/56 | HR 62 | Temp 97.3°F | Resp 18

## 2023-09-12 DIAGNOSIS — Z5112 Encounter for antineoplastic immunotherapy: Secondary | ICD-10-CM | POA: Diagnosis not present

## 2023-09-12 DIAGNOSIS — C8590 Non-Hodgkin lymphoma, unspecified, unspecified site: Secondary | ICD-10-CM

## 2023-09-12 LAB — CMP (CANCER CENTER ONLY)
ALT: 19 U/L (ref 0–44)
AST: 22 U/L (ref 15–41)
Albumin: 4.1 g/dL (ref 3.5–5.0)
Alkaline Phosphatase: 69 U/L (ref 38–126)
Anion gap: 10 (ref 5–15)
BUN: 31 mg/dL — ABNORMAL HIGH (ref 8–23)
CO2: 28 mmol/L (ref 22–32)
Calcium: 8.8 mg/dL — ABNORMAL LOW (ref 8.9–10.3)
Chloride: 96 mmol/L — ABNORMAL LOW (ref 98–111)
Creatinine: 1.15 mg/dL — ABNORMAL HIGH (ref 0.44–1.00)
GFR, Estimated: 46 mL/min — ABNORMAL LOW (ref >60)
Glucose, Bld: 86 mg/dL (ref 70–99)
Potassium: 3.9 mmol/L (ref 3.5–5.1)
Sodium: 134 mmol/L — ABNORMAL LOW (ref 135–145)
Total Bilirubin: 1 mg/dL (ref 0.0–1.2)
Total Protein: 6.9 g/dL (ref 6.5–8.1)

## 2023-09-12 LAB — CBC (CANCER CENTER ONLY)
HCT: 36.8 % (ref 36.0–46.0)
Hemoglobin: 12.2 g/dL (ref 12.0–15.0)
MCH: 31.2 pg (ref 26.0–34.0)
MCHC: 33.2 g/dL (ref 30.0–36.0)
MCV: 94.1 fL (ref 80.0–100.0)
Platelet Count: 247 K/uL (ref 150–400)
RBC: 3.91 MIL/uL (ref 3.87–5.11)
RDW: 15 % (ref 11.5–15.5)
WBC Count: 7.4 K/uL (ref 4.0–10.5)
nRBC: 0 % (ref 0.0–0.2)

## 2023-09-12 MED ORDER — DIPHENHYDRAMINE HCL 25 MG PO CAPS
25.0000 mg | ORAL_CAPSULE | Freq: Once | ORAL | Status: AC
  Administered 2023-09-12: 25 mg via ORAL
  Filled 2023-09-12: qty 1

## 2023-09-12 MED ORDER — SODIUM CHLORIDE 0.9 % IV SOLN
375.0000 mg/m2 | Freq: Once | INTRAVENOUS | Status: AC
  Administered 2023-09-12: 600 mg via INTRAVENOUS
  Filled 2023-09-12: qty 10

## 2023-09-12 MED ORDER — ACETAMINOPHEN 325 MG PO TABS
650.0000 mg | ORAL_TABLET | Freq: Once | ORAL | Status: AC
Start: 2023-09-12 — End: 2023-09-12
  Administered 2023-09-12: 650 mg via ORAL
  Filled 2023-09-12: qty 2

## 2023-09-12 MED ORDER — SODIUM CHLORIDE 0.9 % IV SOLN
INTRAVENOUS | Status: DC
Start: 2023-09-12 — End: 2023-09-12
  Filled 2023-09-12: qty 250

## 2023-09-12 NOTE — Patient Instructions (Addendum)
 [facility] - A DEPT OF [facility]  Discharge Instructions: Thank you for choosing [facility] to provide your oncology and hematology care.  If you have a lab appointment with the Cancer Center, please go directly to the Cancer Center and check in at the registration area.  Wear comfortable clothing and clothing appropriate for easy access to any Portacath or PICC line.   We strive to give you quality time with your provider. You may need to reschedule your appointment if you arrive late (15 or more minutes).  Arriving late affects you and other patients whose appointments are after yours.  Also, if you miss three or more appointments without notifying the office, you may be dismissed from the clinic at the provider's discretion.      For prescription refill requests, have your pharmacy contact our office and allow 72 hours for refills to be completed.    Today you received the following chemotherapy and/or immunotherapy agents Ruxience      To help prevent nausea and vomiting after your treatment, we encourage you to take your nausea medication as directed.  BELOW ARE SYMPTOMS THAT SHOULD BE REPORTED IMMEDIATELY: *FEVER GREATER THAN 100.4 F (38 C) OR HIGHER *CHILLS OR SWEATING *NAUSEA AND VOMITING THAT IS NOT CONTROLLED WITH YOUR NAUSEA MEDICATION *UNUSUAL SHORTNESS OF BREATH *UNUSUAL BRUISING OR BLEEDING *URINARY PROBLEMS (pain or burning when urinating, or frequent urination) *BOWEL PROBLEMS (unusual diarrhea, constipation, pain near the anus) TENDERNESS IN MOUTH AND THROAT WITH OR WITHOUT PRESENCE OF ULCERS (sore throat, sores in mouth, or a toothache) UNUSUAL RASH, SWELLING OR PAIN  UNUSUAL VAGINAL DISCHARGE OR ITCHING   Items with * indicate a potential emergency and should be followed up as soon as possible or go to the Emergency Department if any problems should occur.  Please show the CHEMOTHERAPY ALERT CARD or IMMUNOTHERAPY  ALERT CARD at check-in to the Emergency Department and triage nurse.  Should you have questions after your visit or need to cancel or reschedule your appointment, please contact [facility] - A DEPT OF [facility]  [phone] and follow the prompts.  Office hours are 8:00 a.m. to 4:30 p.m. Monday - Friday. Please note that voicemails left after 4:00 p.m. may not be returned until the following business day.  We are closed weekends and major holidays. You have access to a nurse at all times for urgent questions. Please call the main number to the clinic [phone] and follow the prompts.  For any non-urgent questions, you may also contact your provider using MyChart. We now offer e-Visits for anyone 55 and older to request care online for non-urgent symptoms. For details visit [email or web address].   Also download the MyChart app! Go to the app store, search "MyChart", open the app, select [place], and log in with your MyChart username and password.

## 2023-09-19 ENCOUNTER — Ambulatory Visit: Admitting: Physician Assistant

## 2023-09-19 NOTE — Progress Notes (Signed)
 Cardiac MRI Discharge:   Discharge to: Yes  Home/Outpatient Facility No  Inpatient Bed/ER No Imaging agent adverse reaction noted  Yes The personal belongings were returned to the patient. 12 lead obtained. Reviewed by [facility] physician, ok to discharge.

## 2023-09-22 ENCOUNTER — Ambulatory Visit (INDEPENDENT_AMBULATORY_CARE_PROVIDER_SITE_OTHER): Admitting: Physician Assistant

## 2023-09-22 DIAGNOSIS — N3946 Mixed incontinence: Secondary | ICD-10-CM | POA: Diagnosis not present

## 2023-09-22 NOTE — Progress Notes (Signed)
 PTNS  Session # 37  Health & Social Factors: No change Caffeine: 0 Alcohol: 0 Daytime voids #per day: 7 Night-time voids #per night: 7 Urgency: strong Incontinence Episodes #per day: 2 Ankle used: right Treatment Setting: 8 Feeling/ Response: both Comments: Patient tolerated well.  Performed By: Deundra Furber, PA-C   Follow Up: 1 month

## 2023-10-09 ENCOUNTER — Telehealth: Payer: Self-pay | Admitting: Oncology

## 2023-10-09 ENCOUNTER — Other Ambulatory Visit: Payer: Self-pay | Admitting: Oncology

## 2023-10-09 NOTE — Telephone Encounter (Signed)
 Was returning call from vm - pt daughter just called and said pt is having issues and wants to be seen sooner than her 8/28 appt. Said she is in really bad pain, having swelling, and blurry vision. Asked for a call from clinical at [phone] [facility]

## 2023-10-09 NOTE — Telephone Encounter (Signed)
 Call back made to pt's daughter Scarlette [facility]) for more information, no sudden onset of symptoms. She states that patient's pain is on and off but slowly worsening. She is now having back pain and swelling under her left breast/ rib area along with a rash. She is also having swelling to ankles. She has been having blurry vision for about 1 week. Advised her that ER would be best option for further evaluation, but she states they would rather not go to ER. Appt has been moved up to next week, but advised Scarlette that if pt's pain, swelling or blurry vision worsen to go to ER. She verbalized understanding.

## 2023-10-14 ENCOUNTER — Encounter: Payer: Self-pay | Admitting: Oncology

## 2023-10-14 ENCOUNTER — Inpatient Hospital Stay: Attending: Oncology

## 2023-10-14 ENCOUNTER — Inpatient Hospital Stay (HOSPITAL_BASED_OUTPATIENT_CLINIC_OR_DEPARTMENT_OTHER): Admitting: Oncology

## 2023-10-14 VITALS — BP 97/67 | HR 65 | Temp 97.2°F | Resp 18 | Wt 146.9 lb

## 2023-10-14 DIAGNOSIS — K5900 Constipation, unspecified: Secondary | ICD-10-CM | POA: Insufficient documentation

## 2023-10-14 DIAGNOSIS — C8290 Follicular lymphoma, unspecified, unspecified site: Secondary | ICD-10-CM | POA: Diagnosis present

## 2023-10-14 DIAGNOSIS — R21 Rash and other nonspecific skin eruption: Secondary | ICD-10-CM | POA: Insufficient documentation

## 2023-10-14 DIAGNOSIS — G893 Neoplasm related pain (acute) (chronic): Secondary | ICD-10-CM | POA: Diagnosis not present

## 2023-10-14 DIAGNOSIS — N183 Chronic kidney disease, stage 3 unspecified: Secondary | ICD-10-CM | POA: Insufficient documentation

## 2023-10-14 DIAGNOSIS — K5903 Drug induced constipation: Secondary | ICD-10-CM

## 2023-10-14 DIAGNOSIS — N1832 Chronic kidney disease, stage 3b: Secondary | ICD-10-CM | POA: Diagnosis not present

## 2023-10-14 DIAGNOSIS — G620 Drug-induced polyneuropathy: Secondary | ICD-10-CM | POA: Diagnosis not present

## 2023-10-14 DIAGNOSIS — E039 Hypothyroidism, unspecified: Secondary | ICD-10-CM | POA: Insufficient documentation

## 2023-10-14 DIAGNOSIS — C8590 Non-Hodgkin lymphoma, unspecified, unspecified site: Secondary | ICD-10-CM

## 2023-10-14 DIAGNOSIS — T451X5A Adverse effect of antineoplastic and immunosuppressive drugs, initial encounter: Secondary | ICD-10-CM | POA: Diagnosis not present

## 2023-10-14 DIAGNOSIS — C7951 Secondary malignant neoplasm of bone: Secondary | ICD-10-CM | POA: Diagnosis not present

## 2023-10-14 LAB — CBC WITH DIFFERENTIAL (CANCER CENTER ONLY)
Abs Immature Granulocytes: 0.03 K/uL (ref 0.00–0.07)
Basophils Absolute: 0.1 K/uL (ref 0.0–0.1)
Basophils Relative: 1 %
Eosinophils Absolute: 0.3 K/uL (ref 0.0–0.5)
Eosinophils Relative: 5 %
HCT: 38.7 % (ref 36.0–46.0)
Hemoglobin: 12.8 g/dL (ref 12.0–15.0)
Immature Granulocytes: 0 %
Lymphocytes Relative: 17 %
Lymphs Abs: 1.2 K/uL (ref 0.7–4.0)
MCH: 31.8 pg (ref 26.0–34.0)
MCHC: 33.1 g/dL (ref 30.0–36.0)
MCV: 96 fL (ref 80.0–100.0)
Monocytes Absolute: 0.8 K/uL (ref 0.1–1.0)
Monocytes Relative: 12 %
Neutro Abs: 4.7 K/uL (ref 1.7–7.7)
Neutrophils Relative %: 65 %
Platelet Count: 243 K/uL (ref 150–400)
RBC: 4.03 MIL/uL (ref 3.87–5.11)
RDW: 14.5 % (ref 11.5–15.5)
WBC Count: 7.1 K/uL (ref 4.0–10.5)
nRBC: 0 % (ref 0.0–0.2)

## 2023-10-14 LAB — CMP (CANCER CENTER ONLY)
ALT: 19 U/L (ref 0–44)
AST: 25 U/L (ref 15–41)
Albumin: 4.1 g/dL (ref 3.5–5.0)
Alkaline Phosphatase: 63 U/L (ref 38–126)
Anion gap: 11 (ref 5–15)
BUN: 29 mg/dL — ABNORMAL HIGH (ref 8–23)
CO2: 29 mmol/L (ref 22–32)
Calcium: 9.1 mg/dL (ref 8.9–10.3)
Chloride: 94 mmol/L — ABNORMAL LOW (ref 98–111)
Creatinine: 1.25 mg/dL — ABNORMAL HIGH (ref 0.44–1.00)
GFR, Estimated: 41 mL/min — ABNORMAL LOW (ref >60)
Glucose, Bld: 102 mg/dL — ABNORMAL HIGH (ref 70–99)
Potassium: 4.4 mmol/L (ref 3.5–5.1)
Sodium: 134 mmol/L — ABNORMAL LOW (ref 135–145)
Total Bilirubin: 0.7 mg/dL (ref 0.0–1.2)
Total Protein: 6.9 g/dL (ref 6.5–8.1)

## 2023-10-14 LAB — LACTATE DEHYDROGENASE: LDH: 226 U/L — ABNORMAL HIGH (ref 98–192)

## 2023-10-14 MED ORDER — SENNOSIDES-DOCUSATE SODIUM 8.6-50 MG PO TABS: 2.0000 | ORAL_TABLET | Freq: Every day | ORAL | 1 refills | Status: AC

## 2023-10-14 NOTE — Progress Notes (Signed)
 Hematology/Oncology Progress note [phone] [phone]      Patient Care Team: Sadie Manna, MD as PCP - General (Internal Medicine) Babara Call, MD as Consulting Physician (Oncology)  REFERRING PROVIDER: Dr. Sadie CHIEF COMPLAINTS/REASON FOR VISIT:  Follow up for lymphoma.  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 Previously remote history of follicular lymphoma. I discussed with pathology.   lymphoma NGS showed  negative BCL6, CCND1, IgH.BCL2 and MALT1. Overall clinically patient has stage IV non-Hodgkin's lymphoma, low-grade S/p  Ritxumiab weekly x 4  Clinically no significant improvement.  Repeat PET 3 months after treatments.    Chronic kidney disease (CKD), stage III (moderate) (HCC) Encourage oral hydration and avoid nephrotoxins.    Neoplasm related pain Recommend oxycodone  5mg  Q8 hour PRN.   Cancer, metastatic to bone (HCC) Back pain is worse. Repeat thoracic spine MRI.   Constipation Likely attribute to her abdominal bloating symptoms.  Recommend Senna S 2 tablets daily + Miralax  17g daily PRN  Skin rash Groin rash improved after applying nystatin  rash.  Likely candidiasis. Recommend patient to continue apply nystatin  powder under bilateral breasts.   Orders Placed This Encounter  Procedures   NM PET Image Restag (PS) Skull Base To Thigh    Standing Status:   Future    Expected Date:   12/13/2023    Expiration Date:   10/13/2024    If indicated for the ordered procedure, I authorize the administration of a radiopharmaceutical per Radiology protocol:   Yes    Preferred imaging location?:   [facility]   MR Thoracic Spine W Wo Contrast    Standing Status:   Future    Expected Date:   10/21/2023    Expiration Date:   10/13/2024    GRA to provide read?:   Yes    If indicated for  the ordered procedure, I authorize the administration of contrast media per Radiology protocol:   Yes    What is the patient's sedation requirement?:   No Sedation    Use SRS Protocol?:   No    Does the patient have a pacemaker or implanted devices?:   No    Preferred imaging location?:   [facility] (table limit - 500lbs)   CBC with Differential (Cancer Center Only)    Standing Status:   Future    Expected Date:   12/14/2023    Expiration Date:   03/13/2024   CMP (Cancer Center only)    Standing Status:   Future    Expected Date:   12/14/2023    Expiration Date:   03/13/2024   Lactate dehydrogenase    Standing Status:   Future    Expected Date:   12/14/2023    Expiration Date:   03/13/2024   Follow-up 2 months.  All questions were answered. The patient knows to call the clinic with any problems, questions or concerns. We spent sufficient time to discuss many aspect of care, questions were answered to patient's satisfaction.  Call Babara, MD, PhD [facility] Hematology Oncology 10/14/2023   HISTORY OF PRESENTING ILLNESS:  [name] is a  89 y.o.  female with PMH listed below who was referred to me for evaluation of neck swelling, history of lymphoma. Patient reports remote history of lymphoma many years ago.  She recalls that she received chemotherapy at that time.  Followed up with Dr. Sheryn who retired from our cancer  center.  She was recently seen by PCP and has complained about her concerns of bilateral neck swelling for a while. She could not specify when she noticed this change. She reports feeling anxious as her initial symptoms which led to lymphoma diagnosis was neck LN swelling.   obtained patient's previous records in EMR by Dr.Choski. Patient has history of non hodgkin's lymphoma and s/p chemotherapy treatment. Her pathology was not available to me.   # 12/08/2017 US  thryoid done which showed left mid thyroid  nodule, 2.2 cm x 2.1cm x 2.1cm.  12/30/2017 biopsy showed  follicular neoplasm which is indeterminate. Afirma GSC benign, ROM 4%.  Patient was previously  referred to endocrinology for further management, and appears that patient never established care there.   09/21/2019, patient was evaluated in emergency room for confusion.  Due to the long wait time, patient left without seeing physician in the ED.  Patient had chest x-ray and CT head without contrast along with lab work.  Chest x-ray was negative and CT was negative for acute abnormality.  UA reviewed you urinary tract infection.  Patient presents for additional lab work and during waiting, patient was found to be confused and talk to herself.  She was approached by nursing staff and escorted to examining room for evaluation.  Patient was seen by symptom management nurse practitioner Delon.  Lab work also showed AKI with creatinine increased to 1.5. Patient was treated with supportive care with IV fluids nitrofurantoin  for 7 days for UTI.  Urine culture lateral positive for E. Coli  INTERVAL HISTORY [name] is a 89 y.o. female who has above history reviewed by me today presents for follow up visit for history of lymphoma.  Pathological compression, paraspinal soft tissue mass. Accompanied by daughter [name] who is also patient's power of attorney. Patient reports not feeling good.  She has constant rib cage/abdominal bloating/hard belly. 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 + Skin rash below breasts, blurry vision.   Oncology History  Non-Hodgkin lymphoma (HCC)  06/05/2023 Imaging   CT abdomen pelvis with contrast showed Narrative & Impression  CLINICAL DATA:  Right-sided abdominal pain, history of non-Hodgkin lymphoma   EXAM: CT ABDOMEN AND PELVIS WITH CONTRAST   TECHNIQUE: Multidetector CT imaging of the abdomen and pelvis was performed using the standard protocol following bolus administration of intravenous contrast.   RADIATION DOSE REDUCTION: This exam was  performed according to the departmental dose-optimization program which includes automated exposure control, adjustment of the mA and/or kV according to patient size and/or use of iterative reconstruction technique.   CONTRAST:  80mL OMNIPAQUE  IOHEXOL  300 MG/ML  SOLN   COMPARISON:  04/28/2020   FINDINGS: Lower chest: No acute pleural or parenchymal lung disease.   Hepatobiliary: No focal liver abnormality is seen. No gallstones, gallbladder wall thickening, or biliary dilatation.   Pancreas: Unremarkable. No pancreatic ductal dilatation or surrounding inflammatory changes.   Spleen: Normal in size without focal abnormality.   Adrenals/Urinary Tract: Mild bilateral renal cortical atrophy. No urinary tract calculi or obstructive uropathy. The adrenals and bladder are unremarkable.   Stomach/Bowel: No bowel obstruction or ileus. Diverticulosis of the descending and sigmoid colon. No evidence of acute diverticulitis. No bowel wall thickening or inflammatory change.   Vascular/Lymphatic: Aortic atherosclerosis. No enlarged abdominal or pelvic lymph nodes.   Reproductive: Status post hysterectomy. No adnexal masses.   Other: No free fluid or free intraperitoneal gas. No abdominal wall hernia.   Musculoskeletal: There is an age-indeterminate T7 compression deformity,  with greater than 50% loss of height. Mild paraspinal soft tissue swelling at this location suggests acute to subacute fracture. While there is no significant retropulsion, there does appear to be soft tissue density causing significant central canal stenosis. If further evaluation is desired, MRI may be useful.   No other acute bony abnormalities. Severe spondylosis and facet hypertrophy within the lower lumbar spine, most pronounced at L4-5 with significant central canal stenosis. This is a chronic stable finding. Reconstructed images demonstrate no additional findings.   IMPRESSION: 1. Likely acute to  subacute T7 compression fracture, with greater than 50% loss of height. Prominent paraspinal soft tissue swelling is noted, as well as soft tissue density in the central canal likely contributing to significant central canal stenosis. Further evaluation with MRI may be useful. 2. Distal colonic diverticulosis without diverticulitis. 3.  Aortic Atherosclerosis (ICD10-I70.0).     06/16/2023 Imaging   MRI thoracic spine with and without contrast showed Tumor involving the T6-T9 vertebrae and paraspinal soft tissues with associated severe pathologic T7 compression fracture and epidural tumor resulting in moderate spinal stenosis.     07/04/2023 Initial Diagnosis   Non-Hodgkin lymphoma (HCC)  Patient reports remote history of non-Hodgkin lymphoma which was treated by Dr. Sheryn.  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  07/04/2023, patient presented to emergency room due to findings of abnormal CT findings. Paraspinal tumor involving T6/T9 vertebrae with associated severe compression fracture of the T7, moderate spinal stenosis.  Patient was seen by Dr. Clista during the hospitalization.  Patient was recommended to proceed with biopsy by IR outpatient.  07/14/2023 status post IR biopsy of T8 vertebral body Pathology showed atypical lymphoid infiltrate consistent with B-cell lymphoproliferative disorder.Morphologic evaluation of the bone biopsy reveals a monotonous atypical lymphoid infiltrate comprised of small to medium lymphocytes with monocytoid appearance.  Immunohistochemically, the atypical cells are positive for CD20, PAX5, CD79a and BCL2.  CD3 and CD5 highlight background T lymphocytes. CD10 and BCL6 highlight residual germinal centers.  CD138 and mum 1 highlight plasma cells.  TdT, CD34 and  cyclin D1 are negative in the atypical cells.  CD43 appears to be positive in a subset of the atypical cells.  Kappa/lambda IHC are  noncontributory.  Overall the findings are consistent with a B-cell     lymphoproliferative disorder, particularly marginal zone lymphoma.  However amore definitive assessment is precluded by lack of clonality on  immunohistochemical staining and lack of separate tissue for flow cytometric analysis.  A lymphoma NGS panel will be performed and results will be reported in an addendum.      07/04/2023 Imaging   CT chest abdomen pelvis with contrast 1. Similar severe compression of T7 with surrounding paraspinal soft tissue thickening, better evaluated on prior MRI. 2. Unchanged 4 mm right apical nodule, likely benign. 2 mm bilateral lower lobe nodules, not definitely seen on prior examination, indeterminate. 3. No evidence of metastatic disease in the abdomen or pelvis. 4. Aortic Atherosclerosis (ICD10-I70.0). Coronary artery calcifications. Assessment for potential risk factor modification, dietary therapy or pharmacologic therapy may be warranted, if clinically indicated.     07/28/2023 Imaging   PET scan showed 1. Hypermetabolic lymphoma involving abdominal retroperitoneal and right pelvic lymph nodes as well as midthoracic spine (Deauville 5). 2. Hypermetabolic soft tissue thickening deep to the upper abdominal midline ventral wall, possibly due to  lymphoma. 3. Punctate right renal stone. 4. Chronic calcific pancreatitis. 5. Aortic atherosclerosis (ICD10-I70.0). Coronary artery calcification.   07/31/2023 Bone Marrow Biopsy   Bone marrow biopsy -  Overall normocellular bone marrow with orderly trilineage hematopoiesis.  PERIPHERAL BLOOD: -  Unremarkable smear review.  Note: It is noted the patient has a remote history of a lymphoma (2002 follicular lymphoma per discussion with Dr. Babara with a recent (07/14/2023) diagnosis of an atypical lymphoid infiltrate  consistent with a B-cell lymphoproliferative disorder of the T8 vertebral body.  Lymphocytes are not increased on the aspirate smear slides as well as by immunohistochemical stains of the clot section; however, the clot section on the HE stained slide did highlight a couple small well-circumscribed lymphoid aggregates that were lost on levels and the IHC stains.  These well-circumscribed lymphoid aggregates, however, are within the realm of reactive lymphoid aggregates given their circumscription and age of patient.  Definitive bone marrow involvement by a lymphoproliferative disorder is not identified morphologically or immunohistochemically.    08/04/2023 Cancer Staging   Staging form: Hodgkin and Non-Hodgkin Lymphoma, AJCC 8th Edition - Clinical stage from 08/04/2023: Stage IV (Follicular lymphoma) - Signed by Babara Call, MD on 08/04/2023 Stage prefix: Recurrence   08/15/2023 -  Chemotherapy   Patient is on Treatment Plan : NON-HODGKINS LYMPHOMA Rituximab  q7d      Obtained previous medical records. She had left axillary lymph node excisional biopsy in February 2002.  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  04/24/2020, bone biopsy showed involvement of malignant lymphoma.  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 Patient received R-CHOP treatments and radiation.  Today patient is s/p Rituximab  x 1. She tolerated well.  Abdominal pain/bloating is better.  Back pain after treatment.  Weight is stable. Appetite is fair.  Rash under bilateral breasts   Review of Systems  Constitutional:  Positive for fatigue. Negative for appetite change, chills, fever and unexpected weight change.  HENT:   Negative for hearing loss and voice change.        Blurry vision  Eyes:  Negative for eye problems.  Respiratory:  Negative for chest tightness and cough.   Cardiovascular:  Negative for chest pain.   Gastrointestinal:  Negative for abdominal distention, abdominal pain and blood in stool.       Bloating  Endocrine: Negative for hot flashes.  Genitourinary:  Negative for difficulty urinating and frequency.   Musculoskeletal:  Negative for arthralgias.  Skin:  Positive for rash. Negative for itching.  Neurological:  Negative for extremity weakness.  Hematological:  Negative for adenopathy.  Psychiatric/Behavioral:  Negative for confusion.      MEDICAL HISTORY:  Past Medical History:  Diagnosis Date   Afib (HCC)    a.) CHA2DS2-VASc = 6 (age x 2, sex, CHF, HTN, aortic plaque). b.) rate/rhythm maintained without pharmacological intervention; she is not on oral anticoagulation   Anxiety    Aortic atherosclerosis (HCC)    Arthritis    Basal cell carcinoma 11/11/2017   Right inferior knee. Superficial and nodular patterns.   Bilateral lower extremity edema    Chest pain 07/30/2023   CKD (chronic kidney disease), stage III (HCC)    Congestive heart failure (CHF) (HCC) 05/31/2014   a.) TTE 05/31/2014: EF >55%, RVE; LAE; triv panvalvular regurg; G1DD. b.) TTE 06/29/2017: EF >55%; RVE; BAE; G1DD. c.) TTE 05/09/2018: EF 55-60%; LAE; mod MR, mild AR/TR. d.) TTE 04/29/2020:  EF 60-65%; mi;d-mod MR/TR; RVSP 36.6 mmHg. e.) TTE 08/24/2020: EF >55%; mild LVH; mild MR/TR/PR; sev MAC.   COPD (chronic obstructive pulmonary disease) (HCC)    Coronary artery disease 06/03/2008   a.) LHC 06/03/2008: 40% pLAD-1, 60% pLAD-2, 90% mLAD-1, 30% mLAD-2, 50% mLAD-3 --> PCI placing a 2.0 x 12 mm Mini-Vision BMS x 1 to mLAD-1 lesion. b.) LHC 02/12/10: 50% pLAD, 30% ISR mLAD, 25% dLAD, 25% pLCx, 30% mLCx, 20% pRCA, 20% D1; further intervention deferred opting for medical mgmt.   DDD (degenerative disc disease), lumbar    Depression    Dyspnea 07/30/2023   GERD (gastroesophageal reflux disease)    HOH (hard of hearing)    a.) uses BILATERAL hearing aids   Hurthle cell neoplasm of thyroid     a.) s/p total  thyroidectomy 11/17/2019   Hyperlipemia    Hypertension    Hypothyroidism    Lumbar spinal stenosis    Memory loss    Non Hodgkin's lymphoma (HCC) 2002   Squamous cell carcinoma of skin 06/13/2020   L mid pretibia, biopsy only   Urinary incontinence     SURGICAL HISTORY: Past Surgical History:  Procedure Laterality Date   CORONARY ANGIOPLASTY WITH STENT PLACEMENT Left 06/03/2008   Procedure: CORONARY ANGIOPLASTY WITH STENT PLACEMENT; Location: [facility]; Surgeon(s): Wolm Rhyme, MD (diagnostic) and Margie Lovelace, MD (interventional)   EXCISION OF ABDOMINAL WALL TUMOR Right 05/08/2018   Procedure: EXCISION OF ABDOMINAL WALL-RIGHT;  Surgeon: Rodolph Romano, MD;  Location: [facility] ORS;  Service: General;  Laterality: Right;   INCISIONAL HERNIA REPAIR N/A 05/08/2018   Procedure: LAPAROSCOPIC VS. OPEN INCISIONAL HERNIA REPAIR WITH MESH;  Surgeon: Rodolph Romano, MD;  Location: [facility] OR[facility];  Service: General;  Laterality: N/A;   IR BONE MARROW BIOPSY & ASPIRATION  07/31/2023   IR CT BONE TROCAR/NEEDLE BIOPSY DEEP  07/14/2023   LEFT HEART CATH AND CORONARY ANGIOGRAPHY Left 02/12/2010   Procedure: LEFT HEART CATH AND CORONARY ANGIOGRAPHY; Location: [facility]; Surgeon: Wolm Rhyme, MD   LESION REMOVAL Left 08/22/2021   Procedure: LESION REMOVAL (KNEE);  Surgeon: Jama Cordella MATSU, MD;  Location: [facility] OR[facility];  Service: Vascular;  Laterality: Left;   RIGHT/LEFT HEART CATH AND CORONARY ANGIOGRAPHY Bilateral 06/13/2023   Procedure: RIGHT/LEFT HEART CATH AND CORONARY ANGIOGRAPHY;  Surgeon: Lovelace Cara BIRCH, MD;  Location: [facility] INVASIVE CV [facility];  Service: Cardiovascular;  Laterality: Bilateral;   THYROIDECTOMY Bilateral 11/17/2019   Procedure: TOTAL THYROIDECTOMY;  Surgeon: Edda Mt, MD;  Location: [facility] OR[facility];  Service: ENT;  Laterality: Bilateral;   TOTAL ABDOMINAL HYSTERECTOMY N/A    VEIN SURGERY     Endovenous ablation of saphenous vein    SOCIAL HISTORY: Social History   Socioeconomic  History   Marital status: Single    Spouse name: Not on file   Number of children: Not on file   Years of education: Not on file   Highest education level: Not on file  Occupational History   Occupation: retired  Tobacco Use   Smoking status: Never    Passive exposure: Never   Smokeless tobacco: Never  Vaping Use   Vaping status: Never Used  Substance and Sexual Activity   Alcohol use: No   Drug use: No   Sexual activity: Not on file  Other Topics Concern   Not on file  Social History Narrative   Lives at home independently. Has a cane to ambulate occasionally. Continues to drive.   Social Drivers of Health   Financial Resource Strain: Low Risk  (04/22/2023)  Received from [facility]   Overall Financial Resource Strain (CARDIA)    Difficulty of Paying Living Expenses: Not hard at all  Food Insecurity: No Food Insecurity (07/04/2023)   Hunger Vital Sign    Worried About Running Out of Food in the Last Year: Never true    Ran Out of Food in the Last Year: Never true  Transportation Needs: No Transportation Needs (07/04/2023)   PRAPARE - Administrator, Civil Service (Medical): No    Lack of Transportation (Non-Medical): No  Physical Activity: Not on file  Stress: Not on file  Social Connections: Socially Isolated (07/04/2023)   Social Connection and Isolation Panel    Frequency of Communication with Friends and Family: More than three times a week    Frequency of Social Gatherings with Friends and Family: More than three times a week    Attends Religious Services: Never    Database administrator or Organizations: No    Attends Banker Meetings: Never    Marital Status: Widowed  Intimate Partner Violence: Not At Risk (07/04/2023)   Humiliation, Afraid, Rape, and Kick questionnaire    Fear of Current or Ex-Partner: No    Emotionally Abused: No    Physically Abused: No    Sexually Abused: No    FAMILY HISTORY: Family History   Problem Relation Age of Onset   Heart Problems Mother    Heart Problems Father    Heart attack Father    Diabetes Brother    Diabetes Brother     ALLERGIES:  is allergic to cefdinir, pregabalin, and sulfa antibiotics.  MEDICATIONS:  Current Outpatient Medications  Medication Sig Dispense Refill   acetaminophen  (TYLENOL ) 500 MG tablet Take 500 mg by mouth every 6 (six) hours as needed (pain.).     albuterol  (PROVENTIL ) (2.5 MG/3ML) 0.083% nebulizer solution Take 2.5 mg by nebulization in the morning and at bedtime.     albuterol  (VENTOLIN  HFA) 108 (90 Base) MCG/ACT inhaler Inhale 2 puffs into the lungs 2 (two) times daily.     Calcium Citrate-Vitamin D (CALCIUM CITRATE + D3 PO) Take 1 tablet by mouth in the morning and at bedtime.     Cyanocobalamin (VITAMIN B 12 PO) Take 2,500 mcg by mouth in the morning.     DULoxetine  (CYMBALTA ) 20 MG capsule Take 40 mg by mouth in the morning.     furosemide  (LASIX ) 20 MG tablet Take 20 mg by mouth in the morning.     isosorbide mononitrate (IMDUR) 60 MG 24 hr tablet Take 60 mg by mouth at bedtime.     levothyroxine  (SYNTHROID ) 112 MCG tablet Take 112 mcg by mouth every other day. TAKE ONE TABLET EVERY OTHER DAY, ALTERNATING WITH 125 MCG DOSE     levothyroxine  (SYNTHROID ) 125 MCG tablet Take 125 mcg by mouth every other day. TAKE ONE TABLET EVERY OTHER DAY, ALTERNATING WITH 112 MCG DOSE     liothyronine  (CYTOMEL ) 5 MCG tablet Take 5 mcg by mouth daily before breakfast.     Magnesium  250 MG TABS Take 200 mg by mouth every evening.     metoprolol tartrate (LOPRESSOR) 25 MG tablet Take 25 mg by mouth 2 (two) times daily.     montelukast (SINGULAIR) 10 MG tablet Take 10 mg by mouth at bedtime.     Multiple Vitamin (MULTIVITAMIN WITH MINERALS) TABS tablet Take 1 tablet by mouth every evening.     nystatin  (MYCOSTATIN /NYSTOP ) powder Apply 1 Application topically 3 (  three) times daily. To skin under breasts. 60 g 1   oxyCODONE  (OXY IR/ROXICODONE ) 5 MG  immediate release tablet Take 1 tablet (5 mg total) by mouth every 6 (six) hours as needed for severe pain (pain score 7-10). 30 tablet 0   OZEMPIC, 0.25 OR 0.5 MG/DOSE, 2 MG/3ML SOPN Inject 0.25 mg into the skin every Monday.     pantoprazole  (PROTONIX ) 40 MG tablet Take 40 mg by mouth daily before breakfast.     predniSONE  (DELTASONE ) 5 MG tablet Take 5 mg by mouth in the morning.     QUEtiapine (SEROQUEL) 25 MG tablet Take 25 mg by mouth at bedtime.     rOPINIRole  (REQUIP ) 2 MG tablet Take 2 mg by mouth in the morning, at noon, and at bedtime.     senna-docusate (SENOKOT S) 8.6-50 MG tablet Take 2 tablets by mouth daily. 30 tablet 1   spironolactone (ALDACTONE) 25 MG tablet Take 12.5 mg by mouth every evening.     TRELEGY ELLIPTA 100-62.5-25 MCG/ACT AEPB Inhale 1 puff into the lungs daily.     Turmeric 500 MG CAPS Take 500 mg by mouth every evening.     Vibegron  (GEMTESA ) 75 MG TABS Take 1 tablet (75 mg total) by mouth daily. 90 tablet 3   No current facility-administered medications for this visit.     PHYSICAL EXAMINATION: ECOG PERFORMANCE STATUS: 1 - Symptomatic but completely ambulatory Vitals:   10/14/23 1449  BP: 97/67  Pulse: 65  Resp: 18  Temp: (!) 97.2 F (36.2 C)  SpO2: 99%   Filed Weights   10/14/23 1449  Weight: 146 lb 14.4 oz (66.6 kg)    Physical Exam Constitutional:      General: She is not in acute distress.    Comments: Frail appearance female  HENT:     Head: Normocephalic and atraumatic.  Eyes:     General: No scleral icterus. Cardiovascular:     Rate and Rhythm: Normal rate and regular rhythm.  Pulmonary:     Effort: Pulmonary effort is normal. No respiratory distress.     Breath sounds: No wheezing.  Abdominal:     General: Bowel sounds are normal.     Palpations: Abdomen is soft. There is no mass.     Tenderness: There is no abdominal tenderness.  Musculoskeletal:        General: No deformity. Normal range of motion.     Cervical back:  Normal range of motion and neck supple.  Skin:    General: Skin is warm and dry.     Findings: No erythema or rash.  Neurological:     Mental Status: She is alert and oriented to person, place, and time. Mental status is at baseline.  Psychiatric:        Mood and Affect: Mood normal.      LABORATORY DATA:  I have reviewed the data as listed Lab Results  Component Value Date   WBC 7.1 10/14/2023   HGB 12.8 10/14/2023   HCT 38.7 10/14/2023   MCV 96.0 10/14/2023   PLT 243 10/14/2023   Recent Labs    08/22/23 0819 09/12/23 0804 10/14/23 1437  NA 134* 134* 134*  K 4.0 3.9 4.4  CL 97* 96* 94*  CO2 29 28 29   GLUCOSE 98 86 102*  BUN 46* 31* 29*  CREATININE 1.28* 1.15* 1.25*  CALCIUM 8.5* 8.8* 9.1  GFRNONAA 40* 46* 41*  PROT 6.8 6.9 6.9  ALBUMIN 3.8 4.1 4.1  AST 23  22 25  ALT 22 19 19   ALKPHOS 64 69 63  BILITOT 0.8 1.0 0.7

## 2023-10-14 NOTE — Assessment & Plan Note (Signed)
 Likely attribute to her abdominal bloating symptoms.  Recommend Senna S 2 tablets daily + Miralax  17g daily PRN

## 2023-10-14 NOTE — Assessment & Plan Note (Signed)
 Groin rash improved after applying nystatin  rash.  Likely candidiasis. Recommend patient to continue apply nystatin  powder under bilateral breasts.

## 2023-10-14 NOTE — Assessment & Plan Note (Signed)
 Encourage oral hydration and avoid nephrotoxins.

## 2023-10-14 NOTE — Assessment & Plan Note (Signed)
 Back pain is worse. Repeat thoracic spine MRI.

## 2023-10-14 NOTE — Assessment & Plan Note (Signed)
 Recommend oxycodone  5mg  Q8 hour PRN.

## 2023-10-14 NOTE — Assessment & Plan Note (Addendum)
 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 Previously remote history of follicular lymphoma. I discussed with pathology.   lymphoma NGS showed  negative BCL6, CCND1, IgH.BCL2 and MALT1. Overall clinically patient has stage IV non-Hodgkin's lymphoma, low-grade S/p  Ritxumiab weekly x 4  Clinically no significant improvement.  Repeat PET 3 months after treatments.

## 2023-10-16 ENCOUNTER — Ambulatory Visit
Admission: RE | Admit: 2023-10-16 | Discharge: 2023-10-16 | Disposition: A | Discharge status: Home / Self Care | Source: Ambulatory Visit | Attending: Oncology | Admitting: Oncology

## 2023-10-16 DIAGNOSIS — C8590 Non-Hodgkin lymphoma, unspecified, unspecified site: Secondary | ICD-10-CM | POA: Insufficient documentation

## 2023-10-16 DIAGNOSIS — G893 Neoplasm related pain (acute) (chronic): Secondary | ICD-10-CM | POA: Diagnosis present

## 2023-10-16 MED ORDER — GADOBUTROL 1 MMOL/ML IV SOLN
6.0000 mL | Freq: Once | INTRAVENOUS | Status: AC | PRN
  Administered 2023-10-16: 6 mL via INTRAVENOUS

## 2023-10-20 ENCOUNTER — Ambulatory Visit (INDEPENDENT_AMBULATORY_CARE_PROVIDER_SITE_OTHER): Admitting: Physician Assistant

## 2023-10-20 VITALS — BP 118/59 | HR 74

## 2023-10-20 DIAGNOSIS — N3946 Mixed incontinence: Secondary | ICD-10-CM

## 2023-10-20 NOTE — Progress Notes (Signed)
 PTNS  Session # 38  Health & Social Factors: no change Caffeine: 0 Alcohol: 0 Daytime voids #per day: 8 Night-time voids #per night: 6 Urgency: strong Incontinence Episodes #per day: 1-2 Ankle used: right Treatment Setting: 8 Feeling/ Response: both Comments: Patient tolerated well.  Performed By: Kyoko Elsea, PA-C   Follow Up: 1 month

## 2023-10-21 ENCOUNTER — Encounter: Payer: Self-pay | Admitting: Oncology

## 2023-10-23 ENCOUNTER — Other Ambulatory Visit

## 2023-10-23 ENCOUNTER — Ambulatory Visit: Admitting: Oncology

## 2023-11-03 ENCOUNTER — Ambulatory Visit: Payer: Self-pay | Admitting: Oncology

## 2023-11-03 DIAGNOSIS — C7951 Secondary malignant neoplasm of bone: Secondary | ICD-10-CM

## 2023-11-04 NOTE — Telephone Encounter (Signed)
 Called and spoke to [facility]) and relayed MRI results per Dr. Babara. Informed her that Rad onc referral will be placed and she will be contacted with appt details. PET scan and MD appt will remain the same. [name] verbalized understanding.

## 2023-11-04 NOTE — Telephone Encounter (Signed)
-----   Message from Zelphia Cap sent at 11/03/2023 10:53 PM EDT ----- Please let patient know that her MRI showed mixed picture.  There is compression fracture at T7 and some interval increase of the tumor at T6 and T7.  Some improvement of the tumor seen at T6-T9.  I  recommend patient to establish care with radiation oncology for further evaluation.  She will keep the same appointment with me after the PET scan. ----- Message ----- From: Interface, Rad Results In Sent: 10/27/2023   3:55 PM EDT To: Zelphia Cap, MD

## 2023-11-10 ENCOUNTER — Encounter: Payer: Self-pay | Admitting: Radiation Oncology

## 2023-11-10 ENCOUNTER — Institutional Professional Consult (permissible substitution): Admitting: Radiation Oncology

## 2023-11-10 ENCOUNTER — Ambulatory Visit
Admission: RE | Admit: 2023-11-10 | Discharge: 2023-11-10 | Disposition: A | Discharge status: Home / Self Care | Source: Ambulatory Visit | Attending: Radiation Oncology | Admitting: Radiation Oncology

## 2023-11-10 VITALS — BP 110/67 | HR 60 | Temp 98.0°F | Resp 16

## 2023-11-10 DIAGNOSIS — Z510 Encounter for antineoplastic radiation therapy: Secondary | ICD-10-CM | POA: Diagnosis present

## 2023-11-10 DIAGNOSIS — C801 Malignant (primary) neoplasm, unspecified: Secondary | ICD-10-CM | POA: Diagnosis present

## 2023-11-10 DIAGNOSIS — C7951 Secondary malignant neoplasm of bone: Secondary | ICD-10-CM | POA: Insufficient documentation

## 2023-11-10 NOTE — Consult Note (Signed)
 NEW PATIENT EVALUATION  Name: [name]  MRN: [id number]  Date:   11/10/2023     DOB: 06-Mar-1932   This 89 y.o. female patient presents to the clinic for initial evaluation of thoracic spine involvement of non-Hodgkin's lymphoma.  REFERRING PHYSICIAN: Sadie Manna, MD  CHIEF COMPLAINT:  Chief Complaint  Patient presents with   epidural lesion    DIAGNOSIS: The encounter diagnosis was Cancer, metastatic to bone [facility]).   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  She had a previous history mostly of follicular lymphoma she has been treated with rituximab  although she has been having increasing pain in her upper back.  Recent MRI scan in August showed interval worsening of pathologic compression fracture of T7 with loss of anterior height repeat retropulsion of the posterior wall and moderate focal kyphosis.  She also has mild interval progression of epidural tumor within the spinal canal at T6 and T7 extending along the left lateral and posterior lateral canal causing mild canal stenosis.  She also has known mild improvement in paraspinous tumor previously in the right extending from T6-T9.  She has persistent abnormality of T2 signal through T6-T9 vertebral bodies.  Her last PET CT scan was back in June showing hypermetabolic lymphoma involving abdominal retroperitoneal and right pelvic lymph nodes as well as as mid thoracic spine.  She is able to ambulate although the pain has been persistent and now referred to radiation oncology for consideration of palliative treatment to her thoracic spine.  PLANNED TREATMENT REGIMEN: Palliative radiation therapy to thoracic spine  PAST MEDICAL HISTORY:  has a past medical history of Afib (HCC), Anxiety, Aortic atherosclerosis (HCC), Arthritis, Basal cell carcinoma  (11/11/2017), Bilateral lower extremity edema, Chest pain (07/30/2023), CKD (chronic kidney disease), stage III (HCC), Congestive heart failure (CHF) (HCC) (05/31/2014), COPD (chronic obstructive pulmonary disease) (HCC), Coronary artery disease (06/03/2008), DDD (degenerative disc disease), lumbar, Depression, Dyspnea (07/30/2023), GERD (gastroesophageal reflux disease), HOH (hard of hearing), Hurthle cell neoplasm of thyroid , Hyperlipemia, Hypertension, Hypothyroidism, Lumbar spinal stenosis, Memory loss, Non Hodgkin's lymphoma (HCC) (2002), Squamous cell carcinoma of skin (06/13/2020), and Urinary incontinence.    PAST SURGICAL HISTORY:  Past Surgical History:  Procedure Laterality Date   CORONARY ANGIOPLASTY WITH STENT PLACEMENT Left 06/03/2008   Procedure: CORONARY ANGIOPLASTY WITH STENT PLACEMENT; Location: [facility]; Surgeon(s): Wolm Rhyme, MD (diagnostic) and Margie Lovelace, MD (interventional)   EXCISION OF ABDOMINAL WALL TUMOR Right 05/08/2018   Procedure: EXCISION OF ABDOMINAL WALL-RIGHT;  Surgeon: Rodolph Romano, MD;  Location: [facility] ORS;  Service: General;  Laterality: Right;   INCISIONAL HERNIA REPAIR N/A 05/08/2018   Procedure: LAPAROSCOPIC VS. OPEN INCISIONAL HERNIA REPAIR WITH MESH;  Surgeon: Rodolph Romano, MD;  Location: [facility] ORS;  Service: General;  Laterality: N/A;   IR BONE MARROW BIOPSY & ASPIRATION  07/31/2023   IR CT BONE TROCAR/NEEDLE BIOPSY DEEP  07/14/2023   LEFT HEART CATH AND CORONARY ANGIOGRAPHY Left 02/12/2010   Procedure: LEFT HEART CATH AND CORONARY ANGIOGRAPHY; Location: [facility]; Surgeon: Wolm Rhyme, MD   LESION REMOVAL Left 08/22/2021   Procedure: LESION REMOVAL (KNEE);  Surgeon: Jama Cordella MATSU, MD;  Location: [facility] ORS;  Service: Vascular;  Laterality: Left;   RIGHT/LEFT HEART CATH AND CORONARY ANGIOGRAPHY Bilateral 06/13/2023   Procedure: RIGHT/LEFT HEART CATH AND CORONARY ANGIOGRAPHY;  Surgeon: Lovelace Cara BIRCH, MD;  Location: [facility] INVASIVE CV  LAB;  Service: Cardiovascular;  Laterality: Bilateral;   THYROIDECTOMY Bilateral 11/17/2019   Procedure: TOTAL THYROIDECTOMY;  Surgeon: Edda Mt, MD;  Location: [facility] ORS;  Service: ENT;  Laterality: Bilateral;   TOTAL ABDOMINAL HYSTERECTOMY N/A    VEIN SURGERY     Endovenous ablation of saphenous vein    FAMILY HISTORY: family history includes Diabetes in her brother and brother; Heart Problems in her father and mother; Heart attack in her father.  SOCIAL HISTORY:  reports that she has never smoked. She has never been exposed to tobacco smoke. She has never used smokeless tobacco. She reports that she does not drink alcohol and does not use drugs.  ALLERGIES: Cefdinir, Pregabalin, and Sulfa antibiotics  MEDICATIONS:  Current Outpatient Medications  Medication Sig Dispense Refill   acetaminophen  (TYLENOL ) 500 MG tablet Take 500 mg by mouth every 6 (six) hours as needed (pain.).     albuterol  (PROVENTIL ) (2.5 MG/3ML) 0.083% nebulizer solution Take 2.5 mg by nebulization in the morning and at bedtime.     albuterol  (VENTOLIN  HFA) 108 (90 Base) MCG/ACT inhaler Inhale 2 puffs into the lungs 2 (two) times daily.     Calcium Citrate-Vitamin D (CALCIUM CITRATE + D3 PO) Take 1 tablet by mouth in the morning and at bedtime.     Cyanocobalamin (VITAMIN B 12 PO) Take 2,500 mcg by mouth in the morning.     DULoxetine  (CYMBALTA ) 20 MG capsule Take 40 mg by mouth in the morning.     furosemide  (LASIX ) 20 MG tablet Take 20 mg by mouth in the morning.     isosorbide mononitrate (IMDUR) 60 MG 24 hr tablet Take 60 mg by mouth at bedtime.     levothyroxine  (SYNTHROID ) 112 MCG tablet Take 112 mcg by mouth every other day. TAKE ONE TABLET EVERY OTHER DAY, ALTERNATING WITH 125 MCG DOSE     levothyroxine  (SYNTHROID ) 125 MCG tablet Take 125 mcg by mouth every other day. TAKE ONE TABLET EVERY OTHER DAY, ALTERNATING WITH 112 MCG DOSE     liothyronine  (CYTOMEL ) 5 MCG tablet Take 5 mcg by mouth daily before  breakfast.     Magnesium  250 MG TABS Take 200 mg by mouth every evening.     metoprolol tartrate (LOPRESSOR) 25 MG tablet Take 25 mg by mouth 2 (two) times daily.     montelukast (SINGULAIR) 10 MG tablet Take 10 mg by mouth at bedtime.     Multiple Vitamin (MULTIVITAMIN WITH MINERALS) TABS tablet Take 1 tablet by mouth every evening.     nystatin  (MYCOSTATIN /NYSTOP ) powder Apply 1 Application topically 3 (three) times daily. To skin under breasts. 60 g 1   oxyCODONE  (OXY IR/ROXICODONE ) 5 MG immediate release tablet Take 1 tablet (5 mg total) by mouth every 6 (six) hours as needed for severe pain (pain score 7-10). 30 tablet 0   OZEMPIC, 0.25 OR 0.5 MG/DOSE, 2 MG/3ML SOPN Inject 0.25 mg into the skin every Monday.     pantoprazole  (PROTONIX ) 40 MG tablet Take 40 mg by mouth daily before breakfast.     predniSONE  (DELTASONE ) 5 MG tablet Take 5 mg by mouth in the morning.     QUEtiapine (SEROQUEL) 25 MG tablet Take 25 mg by mouth at bedtime.     rOPINIRole  (REQUIP ) 2 MG tablet Take 2 mg by mouth in the morning, at noon, and at bedtime.     senna-docusate (SENOKOT S) 8.6-50 MG tablet Take 2 tablets by mouth daily. 30 tablet 1   spironolactone (ALDACTONE) 25 MG tablet Take 12.5 mg  by mouth every evening.     TRELEGY ELLIPTA 100-62.5-25 MCG/ACT AEPB Inhale 1 puff into the lungs daily.     Turmeric 500 MG CAPS Take 500 mg by mouth every evening.     Vibegron  (GEMTESA ) 75 MG TABS Take 1 tablet (75 mg total) by mouth daily. 90 tablet 3   No current facility-administered medications for this encounter.    ECOG PERFORMANCE STATUS:  2 - Symptomatic, <50% confined to bed  REVIEW OF SYSTEMS: Patient denies any weight loss, fatigue, weakness, fever, chills or night sweats. Patient denies any loss of vision, blurred vision. Patient denies any ringing  of the ears or hearing loss. No irregular heartbeat. Patient denies heart murmur or history of fainting. Patient denies any chest pain or pain radiating to her  upper extremities. Patient denies any shortness of breath, difficulty breathing at night, cough or hemoptysis. Patient denies any swelling in the lower legs. Patient denies any nausea vomiting, vomiting of blood, or coffee ground material in the vomitus. Patient denies any stomach pain. Patient states has had normal bowel movements no significant constipation or diarrhea. Patient denies any dysuria, hematuria or significant nocturia. Patient denies any problems walking, swelling in the joints or loss of balance. Patient denies any skin changes, loss of hair or loss of weight. Patient denies any excessive worrying or anxiety or significant depression. Patient denies any problems with insomnia. Patient denies excessive thirst, polyuria, polydipsia. Patient denies any swollen glands, patient denies easy bruising or easy bleeding. Patient denies any recent infections, allergies or URI. Patient s visual fields have not changed significantly in recent time.   PHYSICAL EXAM: BP 110/67   Pulse 60   Temp 98 F (36.7 C) (Tympanic)   Resp 16  Motor sensory and DTR levels are equal and symmetric in the upper lower extremities.  No focal neurologic deficits are appreciated.  Deep palpation of her thoracic spine does not elicit pain.  Well-developed well-nourished patient in NAD. HEENT reveals PERLA, EOMI, discs not visualized.  Oral cavity is clear. No oral mucosal lesions are identified. Neck is clear without evidence of cervical or supraclavicular adenopathy. Lungs are clear to A&P. Cardiac examination is essentially unremarkable with regular rate and rhythm without murmur rub or thrill. Abdomen is benign with no organomegaly or masses noted. Motor sensory and DTR levels are equal and symmetric in the upper and lower extremities. Cranial nerves II through XII are grossly intact. Proprioception is intact. No peripheral adenopathy or edema is identified. No motor or sensory levels are noted. Crude visual fields are  within normal range.  LABORATORY DATA: Pathology reports reviewed    RADIOLOGY RESULTS: MRIs CT scans and PET CT scan all reviewed and compatible with above-stated findings.   IMPRESSION: Non-Hodgkin's lymphoma involving the thoracic spine with paraspinal mass in 89 year old female  PLAN: At this time I have recommended palliative ration therapy to her thoracic spine.  Would plan on delivering 30 Gray in 10 fractions.  I would use a PET CT scan for treatment planning purposes.  Risks and benefits of treatment occluding possible dysphagia from radiation esophagitis fatigue alteration of blood counts skin reaction all reviewed in detail with the patient and her daughter.  I have personally set up and ordered CT simulation for later this week.  Patient and daughter both comprehend my recommendations well.  I would like to take this opportunity to thank you for allowing me to participate in the care of your patient.SABRA Marcey Penton, MD

## 2023-11-12 ENCOUNTER — Ambulatory Visit
Admission: RE | Admit: 2023-11-12 | Discharge: 2023-11-12 | Disposition: A | Discharge status: Home / Self Care | Source: Ambulatory Visit | Attending: Radiation Oncology | Admitting: Radiation Oncology

## 2023-11-12 DIAGNOSIS — Z510 Encounter for antineoplastic radiation therapy: Secondary | ICD-10-CM | POA: Diagnosis not present

## 2023-11-13 ENCOUNTER — Institutional Professional Consult (permissible substitution): Admitting: Radiation Oncology

## 2023-11-13 DIAGNOSIS — Z510 Encounter for antineoplastic radiation therapy: Secondary | ICD-10-CM | POA: Diagnosis not present

## 2023-11-18 ENCOUNTER — Ambulatory Visit
Admission: RE | Admit: 2023-11-18 | Discharge: 2023-11-18 | Discharge status: Home / Self Care | Source: Ambulatory Visit | Attending: Radiation Oncology | Admitting: Radiation Oncology

## 2023-11-18 DIAGNOSIS — Z510 Encounter for antineoplastic radiation therapy: Secondary | ICD-10-CM | POA: Diagnosis not present

## 2023-11-19 ENCOUNTER — Ambulatory Visit
Admission: RE | Admit: 2023-11-19 | Discharge: 2023-11-19 | Disposition: A | Discharge status: Home / Self Care | Source: Ambulatory Visit | Attending: Radiation Oncology | Admitting: Radiation Oncology

## 2023-11-19 ENCOUNTER — Other Ambulatory Visit: Payer: Self-pay

## 2023-11-19 DIAGNOSIS — Z510 Encounter for antineoplastic radiation therapy: Secondary | ICD-10-CM | POA: Diagnosis not present

## 2023-11-19 LAB — RAD ONC ARIA SESSION SUMMARY
Course Elapsed Days: 0
Plan Fractions Treated to Date: 1
Plan Prescribed Dose Per Fraction: 3 Gy
Plan Total Fractions Prescribed: 10
Plan Total Prescribed Dose: 30 Gy
Reference Point Dosage Given to Date: 3 Gy
Reference Point Session Dosage Given: 3 Gy
Session Number: 1

## 2023-11-20 ENCOUNTER — Other Ambulatory Visit: Payer: Self-pay

## 2023-11-20 ENCOUNTER — Ambulatory Visit: Admitting: Physician Assistant

## 2023-11-20 ENCOUNTER — Ambulatory Visit
Admission: RE | Admit: 2023-11-20 | Discharge: 2023-11-20 | Disposition: A | Discharge status: Home / Self Care | Source: Ambulatory Visit | Attending: Radiation Oncology | Admitting: Radiation Oncology

## 2023-11-20 DIAGNOSIS — Z510 Encounter for antineoplastic radiation therapy: Secondary | ICD-10-CM | POA: Diagnosis not present

## 2023-11-20 LAB — RAD ONC ARIA SESSION SUMMARY
Course Elapsed Days: 1
Plan Fractions Treated to Date: 2
Plan Prescribed Dose Per Fraction: 3 Gy
Plan Total Fractions Prescribed: 10
Plan Total Prescribed Dose: 30 Gy
Reference Point Dosage Given to Date: 6 Gy
Reference Point Session Dosage Given: 3 Gy
Session Number: 2

## 2023-11-21 ENCOUNTER — Ambulatory Visit
Admission: RE | Admit: 2023-11-21 | Discharge: 2023-11-21 | Disposition: A | Discharge status: Home / Self Care | Source: Ambulatory Visit | Attending: Radiation Oncology | Admitting: Radiation Oncology

## 2023-11-21 ENCOUNTER — Other Ambulatory Visit: Payer: Self-pay

## 2023-11-21 DIAGNOSIS — Z510 Encounter for antineoplastic radiation therapy: Secondary | ICD-10-CM | POA: Diagnosis not present

## 2023-11-21 LAB — RAD ONC ARIA SESSION SUMMARY
Course Elapsed Days: 2
Plan Fractions Treated to Date: 3
Plan Prescribed Dose Per Fraction: 3 Gy
Plan Total Fractions Prescribed: 10
Plan Total Prescribed Dose: 30 Gy
Reference Point Dosage Given to Date: 9 Gy
Reference Point Session Dosage Given: 3 Gy
Session Number: 3

## 2023-11-24 ENCOUNTER — Other Ambulatory Visit: Payer: Self-pay

## 2023-11-24 ENCOUNTER — Ambulatory Visit
Admission: RE | Admit: 2023-11-24 | Discharge: 2023-11-24 | Disposition: A | Source: Ambulatory Visit | Attending: Radiation Oncology | Admitting: Radiation Oncology

## 2023-11-24 DIAGNOSIS — Z510 Encounter for antineoplastic radiation therapy: Secondary | ICD-10-CM | POA: Diagnosis not present

## 2023-11-24 LAB — RAD ONC ARIA SESSION SUMMARY
Course Elapsed Days: 5
Plan Fractions Treated to Date: 4
Plan Prescribed Dose Per Fraction: 3 Gy
Plan Total Fractions Prescribed: 10
Plan Total Prescribed Dose: 30 Gy
Reference Point Dosage Given to Date: 12 Gy
Reference Point Session Dosage Given: 3 Gy
Session Number: 4

## 2023-11-25 ENCOUNTER — Other Ambulatory Visit: Payer: Self-pay

## 2023-11-25 ENCOUNTER — Ambulatory Visit
Admission: RE | Admit: 2023-11-25 | Discharge: 2023-11-25 | Disposition: A | Source: Ambulatory Visit | Attending: Radiation Oncology | Admitting: Radiation Oncology

## 2023-11-25 DIAGNOSIS — Z510 Encounter for antineoplastic radiation therapy: Secondary | ICD-10-CM | POA: Diagnosis not present

## 2023-11-25 LAB — RAD ONC ARIA SESSION SUMMARY
Course Elapsed Days: 6
Plan Fractions Treated to Date: 5
Plan Prescribed Dose Per Fraction: 3 Gy
Plan Total Fractions Prescribed: 10
Plan Total Prescribed Dose: 30 Gy
Reference Point Dosage Given to Date: 15 Gy
Reference Point Session Dosage Given: 3 Gy
Session Number: 5

## 2023-11-26 ENCOUNTER — Ambulatory Visit
Admission: RE | Admit: 2023-11-26 | Discharge: 2023-11-26 | Disposition: A | Discharge status: Home / Self Care | Source: Ambulatory Visit | Attending: Radiation Oncology | Admitting: Radiation Oncology

## 2023-11-26 ENCOUNTER — Other Ambulatory Visit: Payer: Self-pay

## 2023-11-26 DIAGNOSIS — C801 Malignant (primary) neoplasm, unspecified: Secondary | ICD-10-CM | POA: Insufficient documentation

## 2023-11-26 DIAGNOSIS — Z510 Encounter for antineoplastic radiation therapy: Secondary | ICD-10-CM | POA: Diagnosis present

## 2023-11-26 DIAGNOSIS — C7951 Secondary malignant neoplasm of bone: Secondary | ICD-10-CM | POA: Insufficient documentation

## 2023-11-26 LAB — RAD ONC ARIA SESSION SUMMARY
Course Elapsed Days: 7
Plan Fractions Treated to Date: 6
Plan Prescribed Dose Per Fraction: 3 Gy
Plan Total Fractions Prescribed: 10
Plan Total Prescribed Dose: 30 Gy
Reference Point Dosage Given to Date: 18 Gy
Reference Point Session Dosage Given: 3 Gy
Session Number: 6

## 2023-11-27 ENCOUNTER — Other Ambulatory Visit: Payer: Self-pay

## 2023-11-27 ENCOUNTER — Ambulatory Visit
Admission: RE | Admit: 2023-11-27 | Discharge: 2023-11-27 | Disposition: A | Source: Ambulatory Visit | Attending: Radiation Oncology | Admitting: Radiation Oncology

## 2023-11-27 DIAGNOSIS — Z510 Encounter for antineoplastic radiation therapy: Secondary | ICD-10-CM | POA: Diagnosis not present

## 2023-11-27 LAB — RAD ONC ARIA SESSION SUMMARY
Course Elapsed Days: 8
Plan Fractions Treated to Date: 7
Plan Prescribed Dose Per Fraction: 3 Gy
Plan Total Fractions Prescribed: 10
Plan Total Prescribed Dose: 30 Gy
Reference Point Dosage Given to Date: 21 Gy
Reference Point Session Dosage Given: 3 Gy
Session Number: 7

## 2023-11-28 ENCOUNTER — Ambulatory Visit
Admission: RE | Admit: 2023-11-28 | Discharge: 2023-11-28 | Disposition: A | Discharge status: Home / Self Care | Source: Ambulatory Visit | Attending: Radiation Oncology | Admitting: Radiation Oncology

## 2023-11-28 ENCOUNTER — Other Ambulatory Visit: Payer: Self-pay

## 2023-11-28 DIAGNOSIS — Z510 Encounter for antineoplastic radiation therapy: Secondary | ICD-10-CM | POA: Diagnosis not present

## 2023-11-28 LAB — RAD ONC ARIA SESSION SUMMARY
Course Elapsed Days: 9
Plan Fractions Treated to Date: 8
Plan Prescribed Dose Per Fraction: 3 Gy
Plan Total Fractions Prescribed: 10
Plan Total Prescribed Dose: 30 Gy
Reference Point Dosage Given to Date: 24 Gy
Reference Point Session Dosage Given: 3 Gy
Session Number: 8

## 2023-12-01 ENCOUNTER — Ambulatory Visit
Admission: RE | Admit: 2023-12-01 | Discharge: 2023-12-01 | Disposition: A | Source: Ambulatory Visit | Attending: Radiation Oncology | Admitting: Radiation Oncology

## 2023-12-01 ENCOUNTER — Other Ambulatory Visit: Payer: Self-pay

## 2023-12-01 DIAGNOSIS — Z510 Encounter for antineoplastic radiation therapy: Secondary | ICD-10-CM | POA: Diagnosis not present

## 2023-12-01 LAB — RAD ONC ARIA SESSION SUMMARY
Course Elapsed Days: 12
Plan Fractions Treated to Date: 9
Plan Prescribed Dose Per Fraction: 3 Gy
Plan Total Fractions Prescribed: 10
Plan Total Prescribed Dose: 30 Gy
Reference Point Dosage Given to Date: 27 Gy
Reference Point Session Dosage Given: 3 Gy
Session Number: 9

## 2023-12-02 ENCOUNTER — Other Ambulatory Visit: Payer: Self-pay

## 2023-12-02 ENCOUNTER — Ambulatory Visit
Admission: RE | Admit: 2023-12-02 | Discharge: 2023-12-02 | Disposition: A | Source: Ambulatory Visit | Attending: Radiation Oncology | Admitting: Radiation Oncology

## 2023-12-02 ENCOUNTER — Other Ambulatory Visit: Payer: Self-pay | Admitting: *Deleted

## 2023-12-02 DIAGNOSIS — Z510 Encounter for antineoplastic radiation therapy: Secondary | ICD-10-CM | POA: Diagnosis not present

## 2023-12-02 LAB — RAD ONC ARIA SESSION SUMMARY
Course Elapsed Days: 13
Plan Fractions Treated to Date: 10
Plan Prescribed Dose Per Fraction: 3 Gy
Plan Total Fractions Prescribed: 10
Plan Total Prescribed Dose: 30 Gy
Reference Point Dosage Given to Date: 30 Gy
Reference Point Session Dosage Given: 3 Gy
Session Number: 10

## 2023-12-02 MED ORDER — SUCRALFATE 1 G PO TABS: 1.0000 g | ORAL_TABLET | Freq: Three times a day (TID) | ORAL | 2 refills | Status: AC

## 2023-12-05 NOTE — Radiation Completion Notes (Signed)
 Patient Name: [name], [name] MRN: [id number] Date of Birth: Dec 28, 1932 Referring Physician: TAMRA LEVENTHAL, M.D. Date of Service: 2023-12-05 Radiation Oncologist: Marcey Penton, M.D. 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  She had a previous history mostly of follicular lymphoma she has been treated with rituximab  although she has been having increasing pain in her upper back.  Recent MRI scan in August showed interval worsening of pathologic compression fracture of T7 with loss of anterior height repeat retropulsion of the posterior wall and moderate focal kyphosis.  She also has mild interval progression of epidural tumor within the spinal canal at T6 and T7 extending along the left lateral and posterior lateral canal causing mild canal stenosis.  She also has known mild improvement in paraspinous tumor previously in the right extending from T6-T9.  She has persistent abnormality of T2 signal through T6-T9 vertebral bodies.  Her last PET CT scan was back in June showing hypermetabolic lymphoma involving abdominal retroperitoneal and right pelvic lymph nodes as well as as mid thoracic spine.  She is able to ambulate although the pain has been persistent and now referred to radiation oncology for consideration of palliative treatment to her thoracic spine.      ==========DELIVERED PLANS==========  First Treatment Date: 2023-11-19 Last Treatment Date: 2023-12-02   Plan Name: Spine_T Site: Thoracic Spine Technique: 3D Mode: Photon Dose Per Fraction: 3 Gy Prescribed Dose (Delivered / Prescribed): 30 Gy / 30  Gy Prescribed Fxs (Delivered / Prescribed): 10 / 10     ==========ON TREATMENT VISIT DATES========== 2023-11-25, 2023-12-02     ==========UPCOMING VISITS========== 02/03/2024 [facility] SKIN CTR OFFICE VISIT Jackquline Sawyer, MD  01/12/2024 [facility] RAD ONCOLOGY FOLLOW UP 30 Penton Marcey, MD  12/22/2023 [facility] UROLOGY [facility] OFFICE VISIT Maurine Lukes, PA-C  12/19/2023 [facility] MED ONC EST PT Babara Call, MD  12/19/2023 [facility] MED ONC LAB [facility]  12/12/2023 [facility]-PET CT NM PET WB & SB TO MID THIGH [facility]-PET CT1        ==========APPENDIX - ON TREATMENT VISIT NOTES==========   See weekly On Treatment Notes in Epic for details in the Media tab (listed as Progress notes on the On Treatment Visit Dates listed above).

## 2023-12-06 ENCOUNTER — Emergency Department

## 2023-12-06 ENCOUNTER — Other Ambulatory Visit: Payer: Self-pay

## 2023-12-06 ENCOUNTER — Emergency Department: Admission: EM | Admit: 2023-12-06 | Discharge: 2023-12-07 | Disposition: A | Discharge status: Home / Self Care

## 2023-12-06 DIAGNOSIS — C8590 Non-Hodgkin lymphoma, unspecified, unspecified site: Secondary | ICD-10-CM | POA: Diagnosis not present

## 2023-12-06 DIAGNOSIS — R112 Nausea with vomiting, unspecified: Secondary | ICD-10-CM | POA: Insufficient documentation

## 2023-12-06 DIAGNOSIS — R0682 Tachypnea, not elsewhere classified: Secondary | ICD-10-CM | POA: Diagnosis not present

## 2023-12-06 DIAGNOSIS — I509 Heart failure, unspecified: Secondary | ICD-10-CM | POA: Insufficient documentation

## 2023-12-06 DIAGNOSIS — I700 Atherosclerosis of aorta: Secondary | ICD-10-CM | POA: Diagnosis not present

## 2023-12-06 DIAGNOSIS — J449 Chronic obstructive pulmonary disease, unspecified: Secondary | ICD-10-CM | POA: Insufficient documentation

## 2023-12-06 DIAGNOSIS — J9811 Atelectasis: Secondary | ICD-10-CM | POA: Insufficient documentation

## 2023-12-06 DIAGNOSIS — N189 Chronic kidney disease, unspecified: Secondary | ICD-10-CM | POA: Diagnosis not present

## 2023-12-06 DIAGNOSIS — I2510 Atherosclerotic heart disease of native coronary artery without angina pectoris: Secondary | ICD-10-CM | POA: Diagnosis not present

## 2023-12-06 DIAGNOSIS — R058 Other specified cough: Secondary | ICD-10-CM | POA: Insufficient documentation

## 2023-12-06 DIAGNOSIS — N281 Cyst of kidney, acquired: Secondary | ICD-10-CM | POA: Diagnosis not present

## 2023-12-06 DIAGNOSIS — R911 Solitary pulmonary nodule: Secondary | ICD-10-CM | POA: Diagnosis not present

## 2023-12-06 DIAGNOSIS — Z8572 Personal history of non-Hodgkin lymphomas: Secondary | ICD-10-CM | POA: Insufficient documentation

## 2023-12-06 DIAGNOSIS — R079 Chest pain, unspecified: Secondary | ICD-10-CM | POA: Insufficient documentation

## 2023-12-06 LAB — RESP PANEL BY RT-PCR (RSV, FLU A&B, COVID)  RVPGX2
Influenza A by PCR: NEGATIVE
Influenza B by PCR: NEGATIVE
Resp Syncytial Virus by PCR: NEGATIVE
SARS Coronavirus 2 by RT PCR: NEGATIVE

## 2023-12-06 LAB — CBC WITH DIFFERENTIAL/PLATELET
Abs Immature Granulocytes: 0.03 K/uL (ref 0.00–0.07)
Basophils Absolute: 0 K/uL (ref 0.0–0.1)
Basophils Relative: 0 %
Eosinophils Absolute: 0.1 K/uL (ref 0.0–0.5)
Eosinophils Relative: 1 %
HCT: 43.2 % (ref 36.0–46.0)
Hemoglobin: 14.4 g/dL (ref 12.0–15.0)
Immature Granulocytes: 0 %
Lymphocytes Relative: 6 %
Lymphs Abs: 0.4 K/uL — ABNORMAL LOW (ref 0.7–4.0)
MCH: 31.6 pg (ref 26.0–34.0)
MCHC: 33.3 g/dL (ref 30.0–36.0)
MCV: 94.9 fL (ref 80.0–100.0)
Monocytes Absolute: 0.7 K/uL (ref 0.1–1.0)
Monocytes Relative: 11 %
Neutro Abs: 5.7 K/uL (ref 1.7–7.7)
Neutrophils Relative %: 82 %
Platelets: 229 K/uL (ref 150–400)
RBC: 4.55 MIL/uL (ref 3.87–5.11)
RDW: 14.7 % (ref 11.5–15.5)
WBC: 7 K/uL (ref 4.0–10.5)
nRBC: 0 % (ref 0.0–0.2)

## 2023-12-06 LAB — TROPONIN I (HIGH SENSITIVITY)
Troponin I (High Sensitivity): 12 ng/L (ref <18)
Troponin I (High Sensitivity): 14 ng/L (ref <18)

## 2023-12-06 LAB — COMPREHENSIVE METABOLIC PANEL WITH GFR
ALT: 19 U/L (ref 0–44)
AST: 32 U/L (ref 15–41)
Albumin: 4.4 g/dL (ref 3.5–5.0)
Alkaline Phosphatase: 66 U/L (ref 38–126)
Anion gap: 15 (ref 5–15)
BUN: 34 mg/dL — ABNORMAL HIGH (ref 8–23)
CO2: 27 mmol/L (ref 22–32)
Calcium: 9.1 mg/dL (ref 8.9–10.3)
Chloride: 94 mmol/L — ABNORMAL LOW (ref 98–111)
Creatinine, Ser: 1.46 mg/dL — ABNORMAL HIGH (ref 0.44–1.00)
GFR, Estimated: 34 mL/min — ABNORMAL LOW (ref >60)
Glucose, Bld: 102 mg/dL — ABNORMAL HIGH (ref 70–99)
Potassium: 4.2 mmol/L (ref 3.5–5.1)
Sodium: 136 mmol/L (ref 135–145)
Total Bilirubin: 1 mg/dL (ref 0.0–1.2)
Total Protein: 7.7 g/dL (ref 6.5–8.1)

## 2023-12-06 LAB — LIPASE, BLOOD: Lipase: 33 U/L (ref 11–51)

## 2023-12-06 LAB — BRAIN NATRIURETIC PEPTIDE: B Natriuretic Peptide: 147.4 pg/mL — ABNORMAL HIGH (ref 0.0–100.0)

## 2023-12-06 MED ORDER — SODIUM CHLORIDE 0.9 % IV BOLUS
500.0000 mL | Freq: Once | INTRAVENOUS | Status: AC
  Administered 2023-12-06: 500 mL via INTRAVENOUS

## 2023-12-06 MED ORDER — IOHEXOL 350 MG/ML SOLN
75.0000 mL | Freq: Once | INTRAVENOUS | Status: AC | PRN
  Administered 2023-12-06: 75 mL via INTRAVENOUS

## 2023-12-06 MED ORDER — FENTANYL CITRATE (PF) 50 MCG/ML IJ SOSY
25.0000 ug | PREFILLED_SYRINGE | Freq: Once | INTRAMUSCULAR | Status: AC
  Administered 2023-12-06: 25 ug via INTRAVENOUS
  Filled 2023-12-06: qty 1

## 2023-12-06 MED ORDER — ONDANSETRON HCL 4 MG/2ML IJ SOLN
4.0000 mg | Freq: Once | INTRAMUSCULAR | Status: AC
Start: 2023-12-06 — End: 2023-12-06
  Administered 2023-12-06: 4 mg via INTRAVENOUS
  Filled 2023-12-06: qty 2

## 2023-12-06 MED ORDER — FAMOTIDINE IN NACL 20-0.9 MG/50ML-% IV SOLN
20.0000 mg | Freq: Once | INTRAVENOUS | Status: AC
  Administered 2023-12-06: 20 mg via INTRAVENOUS
  Filled 2023-12-06: qty 50

## 2023-12-06 MED ORDER — IPRATROPIUM-ALBUTEROL 0.5-2.5 (3) MG/3ML IN SOLN
3.0000 mL | Freq: Once | RESPIRATORY_TRACT | Status: AC
  Administered 2023-12-06: 3 mL via RESPIRATORY_TRACT
  Filled 2023-12-06: qty 3

## 2023-12-06 MED ORDER — ONDANSETRON 4 MG PO TBDP: 4.0000 mg | ORAL_TABLET | Freq: Three times a day (TID) | ORAL | 0 refills | Status: AC | PRN

## 2023-12-06 NOTE — ED Triage Notes (Signed)
 Pt presents for chest pain. Pt states this has been happening for 3 days. Endorsing mildly productive cough which relieves the pain. Denies known fevers. Pt grunting and stating  I just can't get it all the way up. O2 sat 99% Managing secretions and speaking in clear sentences without difficulty

## 2023-12-06 NOTE — ED Provider Notes (Signed)
 [facility] Provider Note    Event Date/Time   First MD Initiated Contact with Patient 12/06/23 1909     (approximate)   History   Chest Pain  Pt presents for chest pain. Pt states this has been happening for 3 days. Endorsing mildly productive cough which relieves the pain. Denies known fevers. Pt grunting and stating  I just can't get it all the way up. O2 sat 99% Managing secretions and speaking in clear sentences without difficulty   HPI [name] is a 89 y.o. female PMH COPD, CKD, non-Hodgkin lymphoma, CAD, CHF presents for evaluation of chest pain, regurgitation - Patient appears to be both vomiting and spitting up saliva and mucus throughout my eval.  Grunting complaining of discomfort that starts from her epigastrium and runs up through her sternum.  Initially says she has been coughing but then tells me she has not been coughing.  Denies lower abdominal pain.  Says this has been ongoing intermittently over the past 3 days. - Denies any foreign body sensation in throat/upper chest       Physical Exam   Triage Vital Signs: BP 129/75 (BP Location: Right Arm)   Pulse 75   Temp 98 F (36.7 C) (Oral)   Resp (!) 24   SpO2 98%    Most recent vital signs: Vitals:   12/06/23 1921  BP: 129/75  Pulse: 75  Resp: (!) 24  Temp: 98 F (36.7 C)  SpO2: 98%     General: Awake, standing next to bed with vomit bag, appears uncomfortable, grunting, small clear vomitus followed by spitting out clear mucus during my eval CV:  Good peripheral perfusion. RRR, RP 2+ Resp:  Mild tachypnea that appears to be due to discomfort, CTAB Abd:  No distention.  Mild tenderness to palpation in epigastrium    ED Results / Procedures / Treatments   Labs (all labs ordered are listed, but only abnormal results are displayed) Labs Reviewed - No data to display   EKG  See ED course below.   RADIOLOGY *** {USE THE WORD INTERPRETED!! You MUST document  your own interpretation of imaging, as well as the fact that you reviewed the radiologist's report!:1}   PROCEDURES:  Critical Care performed: {CriticalCareYesNo:19197::Yes, see critical care procedure note(s),No}  Procedures   MEDICATIONS ORDERED IN ED: Medications - No data to display   IMPRESSION / MDM / ASSESSMENT AND PLAN / ED COURSE  I reviewed the triage vital signs and the nursing notes.                              DDX/MDM/AP: Differential diagnosis includes, but is not limited to, gastritis, viral syndrome including influenza or COVID-19, consider ACS, do not clinically suspect PE/pneumothorax/aortic dissection, consider esophageal foreign body though history does not clearly indicate this.  Consider some component of aspiration or underlying pneumonia.  Plan: - Labs  -EKG - Chest x-ray - Zofran , Pepcid , small dose of fentanyl , DuoNeb - Likely CT chest and CT abdomen pelvis  Patient's presentation is most consistent with acute presentation with potential threat to life or bodily function.  The patient is on the cardiac monitor to evaluate for evidence of arrhythmia and/or significant heart rate changes.  ED course below. ***  Clinical Course as of 12/06/23 2020  Sat Dec 06, 2023  1923 Ecg = sinus rhythm, rate 79, no gross ST elevation or depression, no significant repolarization abnormality,  normal axis, normal intervals.  No clear evidence of ischemia no arrhythmia on my interpretation. [MM]  2020 [facility] wnl [MM]    Clinical Course User Index [MM] Clarine Ozell LABOR, MD     FINAL CLINICAL IMPRESSION(S) / ED DIAGNOSES   Final diagnoses:  None     Rx / DC Orders   ED Discharge Orders     None        Note:  This document was prepared using Dragon voice recognition software and may include unintentional dictation errors.

## 2023-12-06 NOTE — Discharge Instructions (Signed)
 Your evaluation in the emergency department was overall reassuring.  Your nausea and vomiting improved with treatment, I have prescribed you a nausea medication to use as needed for any ongoing discomfort.  Please do follow-up closely with your primary care provider for reevaluation, and return to the emergency department with any new or worsening symptoms.

## 2023-12-07 NOTE — ED Notes (Signed)
 Pt discharged to home, instructions and medications reviewed.  Pt verbalized understanding, no questions at this time.

## 2023-12-09 ENCOUNTER — Inpatient Hospital Stay

## 2023-12-09 ENCOUNTER — Other Ambulatory Visit: Payer: Self-pay

## 2023-12-09 ENCOUNTER — Inpatient Hospital Stay: Attending: Oncology | Admitting: Hospice and Palliative Medicine

## 2023-12-09 ENCOUNTER — Other Ambulatory Visit: Payer: Self-pay | Admitting: *Deleted

## 2023-12-09 ENCOUNTER — Telehealth: Payer: Self-pay | Admitting: *Deleted

## 2023-12-09 ENCOUNTER — Encounter: Payer: Self-pay | Admitting: Hospice and Palliative Medicine

## 2023-12-09 VITALS — BP 102/75 | HR 80 | Temp 97.5°F | Resp 18 | Ht 60.0 in | Wt 147.5 lb

## 2023-12-09 DIAGNOSIS — N183 Chronic kidney disease, stage 3 unspecified: Secondary | ICD-10-CM | POA: Insufficient documentation

## 2023-12-09 DIAGNOSIS — K209 Esophagitis, unspecified without bleeding: Secondary | ICD-10-CM

## 2023-12-09 DIAGNOSIS — C8280 Other types of follicular lymphoma, unspecified site: Secondary | ICD-10-CM | POA: Diagnosis present

## 2023-12-09 DIAGNOSIS — C7951 Secondary malignant neoplasm of bone: Secondary | ICD-10-CM

## 2023-12-09 DIAGNOSIS — K208 Other esophagitis without bleeding: Secondary | ICD-10-CM | POA: Insufficient documentation

## 2023-12-09 DIAGNOSIS — I130 Hypertensive heart and chronic kidney disease with heart failure and stage 1 through stage 4 chronic kidney disease, or unspecified chronic kidney disease: Secondary | ICD-10-CM | POA: Insufficient documentation

## 2023-12-09 DIAGNOSIS — E860 Dehydration: Secondary | ICD-10-CM

## 2023-12-09 DIAGNOSIS — C8590 Non-Hodgkin lymphoma, unspecified, unspecified site: Secondary | ICD-10-CM

## 2023-12-09 DIAGNOSIS — R5383 Other fatigue: Secondary | ICD-10-CM | POA: Insufficient documentation

## 2023-12-09 LAB — CMP (CANCER CENTER ONLY)
ALT: 17 U/L (ref 0–44)
AST: 25 U/L (ref 15–41)
Albumin: 3.8 g/dL (ref 3.5–5.0)
Alkaline Phosphatase: 57 U/L (ref 38–126)
Anion gap: 10 (ref 5–15)
BUN: 30 mg/dL — ABNORMAL HIGH (ref 8–23)
CO2: 27 mmol/L (ref 22–32)
Calcium: 8.5 mg/dL — ABNORMAL LOW (ref 8.9–10.3)
Chloride: 96 mmol/L — ABNORMAL LOW (ref 98–111)
Creatinine: 1.4 mg/dL — ABNORMAL HIGH (ref 0.44–1.00)
GFR, Estimated: 36 mL/min — ABNORMAL LOW (ref >60)
Glucose, Bld: 100 mg/dL — ABNORMAL HIGH (ref 70–99)
Potassium: 3.6 mmol/L (ref 3.5–5.1)
Sodium: 133 mmol/L — ABNORMAL LOW (ref 135–145)
Total Bilirubin: 1.1 mg/dL (ref 0.0–1.2)
Total Protein: 6.3 g/dL — ABNORMAL LOW (ref 6.5–8.1)

## 2023-12-09 LAB — CBC WITH DIFFERENTIAL (CANCER CENTER ONLY)
Abs Immature Granulocytes: 0.02 K/uL (ref 0.00–0.07)
Basophils Absolute: 0 K/uL (ref 0.0–0.1)
Basophils Relative: 0 %
Eosinophils Absolute: 0.2 K/uL (ref 0.0–0.5)
Eosinophils Relative: 3 %
HCT: 36.1 % (ref 36.0–46.0)
Hemoglobin: 12.5 g/dL (ref 12.0–15.0)
Immature Granulocytes: 0 %
Lymphocytes Relative: 9 %
Lymphs Abs: 0.5 K/uL — ABNORMAL LOW (ref 0.7–4.0)
MCH: 32.1 pg (ref 26.0–34.0)
MCHC: 34.6 g/dL (ref 30.0–36.0)
MCV: 92.8 fL (ref 80.0–100.0)
Monocytes Absolute: 0.8 K/uL (ref 0.1–1.0)
Monocytes Relative: 14 %
Neutro Abs: 4.4 K/uL (ref 1.7–7.7)
Neutrophils Relative %: 74 %
Platelet Count: 210 K/uL (ref 150–400)
RBC: 3.89 MIL/uL (ref 3.87–5.11)
RDW: 14.6 % (ref 11.5–15.5)
WBC Count: 6 K/uL (ref 4.0–10.5)
nRBC: 0 % (ref 0.0–0.2)

## 2023-12-09 MED ORDER — OXYCODONE HCL 5 MG PO TABS: 5.0000 mg | ORAL_TABLET | Freq: Four times a day (QID) | ORAL | 0 refills | Status: AC | PRN

## 2023-12-09 MED ORDER — PANTOPRAZOLE SODIUM 40 MG PO TBEC: 40.0000 mg | DELAYED_RELEASE_TABLET | Freq: Two times a day (BID) | ORAL | 1 refills | Status: DC

## 2023-12-09 MED ORDER — SODIUM CHLORIDE 0.9 % IV SOLN
INTRAVENOUS | Status: DC
  Filled 2023-12-09 (×2): qty 250

## 2023-12-09 NOTE — Telephone Encounter (Signed)
 Spoke with daughter. She can have patient here by 1pm to see Josh today.

## 2023-12-09 NOTE — Progress Notes (Signed)
 Symptom Management [facility] Telephone:([phone]) [phone] [phone]  Patient Care Team: Sadie Manna, MD as PCP - General (Internal Medicine) Babara Call, MD as Consulting Physician (Oncology)   NAME OF PATIENT: [name]  [id number]  Jul 31, 1932   DATE OF VISIT: 12/09/23  REASON FOR CONSULT: ELAYJAH CHANEY is a 89 y.o. female with multiple medical problems including stage IV non-Hodgkin's lymphoma status post rituximab  and XRT.   INTERVAL HISTORY: Patient completed XRT to the thoracic spine on 12/02/2023.  She presented to emergency department on 12/06/2023 with chest pain, nausea and vomiting.  She had a reassuring workup and symptoms were thought secondary to treatment effects from chemo and radiation.  Patient presents [facility] today with complaint of continued pain and discomfort when she eats and drinks.  She describes feeling that she has food they get stuck in her esophagus/stomach.  She describes occasional nausea and vomiting.  Appetite is poor.  Daughter is giving her half a tablet of Carafate 3 times daily.  Patient is out of pain medications.  Denies recent fevers or illnesses. Denies urinary complaints. Patient offers no further specific complaints today.  PAST MEDICAL HISTORY: Past Medical History:  Diagnosis Date   Afib (HCC)    a.) CHA2DS2-VASc = 6 (age x 2, sex, CHF, HTN, aortic plaque). b.) rate/rhythm maintained without pharmacological intervention; she is not on oral anticoagulation   Anxiety    Aortic atherosclerosis    Arthritis    Basal cell carcinoma 11/11/2017   Right inferior knee. Superficial and nodular patterns.   Bilateral lower extremity edema    Chest pain 07/30/2023   CKD (chronic kidney disease), stage III (HCC)    Congestive heart failure (CHF) (HCC) 05/31/2014   a.) TTE 05/31/2014: EF >55%, RVE; LAE; triv panvalvular regurg; G1DD. b.) TTE 06/29/2017: EF >55%; RVE; BAE; G1DD. c.) TTE  05/09/2018: EF 55-60%; LAE; mod MR, mild AR/TR. d.) TTE 04/29/2020: EF 60-65%; mi;d-mod MR/TR; RVSP 36.6 mmHg. e.) TTE 08/24/2020: EF >55%; mild LVH; mild MR/TR/PR; sev MAC.   COPD (chronic obstructive pulmonary disease) (HCC)    Coronary artery disease 06/03/2008   a.) LHC 06/03/2008: 40% pLAD-1, 60% pLAD-2, 90% mLAD-1, 30% mLAD-2, 50% mLAD-3 --> PCI placing a 2.0 x 12 mm Mini-Vision BMS x 1 to mLAD-1 lesion. b.) LHC 02/12/10: 50% pLAD, 30% ISR mLAD, 25% dLAD, 25% pLCx, 30% mLCx, 20% pRCA, 20% D1; further intervention deferred opting for medical mgmt.   DDD (degenerative disc disease), lumbar    Depression    Dyspnea 07/30/2023   GERD (gastroesophageal reflux disease)    HOH (hard of hearing)    a.) uses BILATERAL hearing aids   Hurthle cell neoplasm of thyroid     a.) s/p total thyroidectomy 11/17/2019   Hyperlipemia    Hypertension    Hypothyroidism    Lumbar spinal stenosis    Memory loss    Non Hodgkin's lymphoma (HCC) 2002   Squamous cell carcinoma of skin 06/13/2020   L mid pretibia, biopsy only   Urinary incontinence     PAST SURGICAL HISTORY:  Past Surgical History:  Procedure Laterality Date   CORONARY ANGIOPLASTY WITH STENT PLACEMENT Left 06/03/2008   Procedure: CORONARY ANGIOPLASTY WITH STENT PLACEMENT; Location: [facility]; Surgeon(s): Wolm Rhyme, MD (diagnostic) and Margie Lovelace, MD (interventional)   EXCISION OF ABDOMINAL WALL TUMOR Right 05/08/2018   Procedure: EXCISION OF ABDOMINAL WALL-RIGHT;  Surgeon: Rodolph Romano, MD;  Location: [facility] ORS;  Service: General;  Laterality: Right;  INCISIONAL HERNIA REPAIR N/A 05/08/2018   Procedure: LAPAROSCOPIC VS. OPEN INCISIONAL HERNIA REPAIR WITH MESH;  Surgeon: Rodolph Romano, MD;  Location: [facility] ORS;  Service: General;  Laterality: N/A;   IR BONE MARROW BIOPSY & ASPIRATION  07/31/2023   IR CT BONE TROCAR/NEEDLE BIOPSY DEEP  07/14/2023   LEFT HEART CATH AND CORONARY ANGIOGRAPHY Left 02/12/2010   Procedure: LEFT  HEART CATH AND CORONARY ANGIOGRAPHY; Location: [facility]; Surgeon: Wolm Rhyme, MD   LESION REMOVAL Left 08/22/2021   Procedure: LESION REMOVAL (KNEE);  Surgeon: Jama Cordella MATSU, MD;  Location: [facility] ORS;  Service: Vascular;  Laterality: Left;   RIGHT/LEFT HEART CATH AND CORONARY ANGIOGRAPHY Bilateral 06/13/2023   Procedure: RIGHT/LEFT HEART CATH AND CORONARY ANGIOGRAPHY;  Surgeon: Florencio Cara BIRCH, MD;  Location: [facility] INVASIVE CV LAB;  Service: Cardiovascular;  Laterality: Bilateral;   THYROIDECTOMY Bilateral 11/17/2019   Procedure: TOTAL THYROIDECTOMY;  Surgeon: Edda Mt, MD;  Location: [facility] ORS;  Service: ENT;  Laterality: Bilateral;   TOTAL ABDOMINAL HYSTERECTOMY N/A    VEIN SURGERY     Endovenous ablation of saphenous vein    HEMATOLOGY/ONCOLOGY HISTORY:  Oncology History  Non-Hodgkin lymphoma (HCC)  06/05/2023 Imaging   CT abdomen pelvis with contrast showed Narrative & Impression  CLINICAL DATA:  Right-sided abdominal pain, history of non-Hodgkin lymphoma   EXAM: CT ABDOMEN AND PELVIS WITH CONTRAST   TECHNIQUE: Multidetector CT imaging of the abdomen and pelvis was performed using the standard protocol following bolus administration of intravenous contrast.   RADIATION DOSE REDUCTION: This exam was performed according to the departmental dose-optimization program which includes automated exposure control, adjustment of the mA and/or kV according to patient size and/or use of iterative reconstruction technique.   CONTRAST:  80mL OMNIPAQUE  IOHEXOL  300 MG/ML  SOLN   COMPARISON:  04/28/2020   FINDINGS: Lower chest: No acute pleural or parenchymal lung disease.   Hepatobiliary: No focal liver abnormality is seen. No gallstones, gallbladder wall thickening, or biliary dilatation.   Pancreas: Unremarkable. No pancreatic ductal dilatation or surrounding inflammatory changes.   Spleen: Normal in size without focal abnormality.   Adrenals/Urinary Tract: Mild  bilateral renal cortical atrophy. No urinary tract calculi or obstructive uropathy. The adrenals and bladder are unremarkable.   Stomach/Bowel: No bowel obstruction or ileus. Diverticulosis of the descending and sigmoid colon. No evidence of acute diverticulitis. No bowel wall thickening or inflammatory change.   Vascular/Lymphatic: Aortic atherosclerosis. No enlarged abdominal or pelvic lymph nodes.   Reproductive: Status post hysterectomy. No adnexal masses.   Other: No free fluid or free intraperitoneal gas. No abdominal wall hernia.   Musculoskeletal: There is an age-indeterminate T7 compression deformity, with greater than 50% loss of height. Mild paraspinal soft tissue swelling at this location suggests acute to subacute fracture. While there is no significant retropulsion, there does appear to be soft tissue density causing significant central canal stenosis. If further evaluation is desired, MRI may be useful.   No other acute bony abnormalities. Severe spondylosis and facet hypertrophy within the lower lumbar spine, most pronounced at L4-5 with significant central canal stenosis. This is a chronic stable finding. Reconstructed images demonstrate no additional findings.   IMPRESSION: 1. Likely acute to subacute T7 compression fracture, with greater than 50% loss of height. Prominent paraspinal soft tissue swelling is noted, as well as soft tissue density in the central canal likely contributing to significant central canal stenosis. Further evaluation with MRI may be useful. 2. Distal colonic diverticulosis without diverticulitis. 3.  Aortic Atherosclerosis (ICD10-I70.0).  06/16/2023 Imaging   MRI thoracic spine with and without contrast showed Tumor involving the T6-T9 vertebrae and paraspinal soft tissues with associated severe pathologic T7 compression fracture and epidural tumor resulting in moderate spinal stenosis.     07/04/2023 Initial Diagnosis    Non-Hodgkin lymphoma (HCC)  Patient reports remote history of non-Hodgkin lymphoma which was treated by Dr. Sheryn.  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  07/04/2023, patient presented to emergency room due to findings of abnormal CT findings. Paraspinal tumor involving T6/T9 vertebrae with associated severe compression fracture of the T7, moderate spinal stenosis.  Patient was seen by Dr. Clista during the hospitalization.  Patient was recommended to proceed with biopsy by IR outpatient.  07/14/2023 status post IR biopsy of T8 vertebral body Pathology showed atypical lymphoid infiltrate consistent with B-cell lymphoproliferative disorder.Morphologic evaluation of the bone biopsy reveals a monotonous atypical lymphoid infiltrate comprised of small to medium lymphocytes with monocytoid appearance.  Immunohistochemically, the atypical cells are positive for CD20, PAX5, CD79a and BCL2.  CD3 and CD5 highlight background T lymphocytes. CD10 and BCL6 highlight residual germinal centers.  CD138 and mum 1 highlight plasma cells.  TdT, CD34 and cyclin D1 are negative in the atypical cells.  CD43 appears to be positive in a subset of the atypical cells.  Kappa/lambda IHC are  noncontributory.  Overall the findings are consistent with a B-cell     lymphoproliferative disorder, particularly marginal zone lymphoma.  However amore definitive assessment is precluded by lack of clonality on  immunohistochemical staining and lack of separate tissue for flow cytometric analysis.  A lymphoma NGS panel will be performed and results will be reported in an addendum.      07/04/2023 Imaging   CT chest abdomen pelvis with contrast 1. Similar severe compression of T7 with surrounding paraspinal soft tissue thickening,  better evaluated on prior MRI. 2. Unchanged 4 mm right apical nodule, likely benign. 2 mm bilateral lower lobe nodules, not definitely seen on prior examination, indeterminate. 3. No evidence of metastatic disease in the abdomen or pelvis. 4. Aortic Atherosclerosis (ICD10-I70.0). Coronary artery calcifications. Assessment for potential risk factor modification, dietary therapy or pharmacologic therapy may be warranted, if clinically indicated.     07/28/2023 Imaging   PET scan showed 1. Hypermetabolic lymphoma involving abdominal retroperitoneal and right pelvic lymph nodes as well as midthoracic spine (Deauville 5). 2. Hypermetabolic soft tissue thickening deep to the upper abdominal midline ventral wall, possibly due to lymphoma. 3. Punctate right renal stone. 4. Chronic calcific pancreatitis. 5. Aortic atherosclerosis (ICD10-I70.0). Coronary artery calcification.   07/31/2023 Bone Marrow Biopsy   Bone marrow biopsy -  Overall normocellular bone marrow with orderly trilineage hematopoiesis.  PERIPHERAL BLOOD: -  Unremarkable smear review.  Note: It is noted the patient has a remote history of a lymphoma (2002 follicular lymphoma per discussion with Dr. Babara with a recent (07/14/2023) diagnosis of an atypical lymphoid infiltrate consistent with a B-cell lymphoproliferative disorder of the T8 vertebral body.  Lymphocytes are not increased on the aspirate smear slides as well as by immunohistochemical stains of the clot section; however, the clot section on the HE stained slide did highlight a couple small well-circumscribed lymphoid aggregates that were lost on levels and the IHC stains.  These well-circumscribed lymphoid aggregates, however, are within the realm of reactive lymphoid aggregates given  their circumscription and age of patient.  Definitive bone marrow involvement by a lymphoproliferative disorder is not identified morphologically or immunohistochemically.    08/04/2023 Cancer  Staging   Staging form: Hodgkin and Non-Hodgkin Lymphoma, AJCC 8th Edition - Clinical stage from 08/04/2023: Stage IV (Follicular lymphoma) - Signed by Babara Call, MD on 08/04/2023 Stage prefix: Recurrence   08/15/2023 -  Chemotherapy   Patient is on Treatment Plan : NON-HODGKINS LYMPHOMA Rituximab  q7d       ALLERGIES:  is allergic to cefdinir, pregabalin, and sulfa antibiotics.  MEDICATIONS:  Current Outpatient Medications  Medication Sig Dispense Refill   acetaminophen  (TYLENOL ) 500 MG tablet Take 500 mg by mouth every 6 (six) hours as needed (pain.).     Calcium Citrate-Vitamin D (CALCIUM CITRATE + D3 PO) Take 1 tablet by mouth in the morning and at bedtime.     Cyanocobalamin (VITAMIN B 12 PO) Take 2,500 mcg by mouth in the morning.     DULoxetine  (CYMBALTA ) 20 MG capsule Take 40 mg by mouth in the morning.     furosemide  (LASIX ) 20 MG tablet Take 20 mg by mouth in the morning.     isosorbide mononitrate (IMDUR) 60 MG 24 hr tablet Take 60 mg by mouth at bedtime.     levothyroxine  (SYNTHROID ) 112 MCG tablet Take 112 mcg by mouth every other day. TAKE ONE TABLET EVERY OTHER DAY, ALTERNATING WITH 125 MCG DOSE     levothyroxine  (SYNTHROID ) 125 MCG tablet Take 125 mcg by mouth every other day. TAKE ONE TABLET EVERY OTHER DAY, ALTERNATING WITH 112 MCG DOSE     liothyronine  (CYTOMEL ) 5 MCG tablet Take 5 mcg by mouth daily before breakfast.     Magnesium  250 MG TABS Take 200 mg by mouth every evening.     metoprolol tartrate (LOPRESSOR) 25 MG tablet Take 25 mg by mouth 2 (two) times daily.     montelukast (SINGULAIR) 10 MG tablet Take 10 mg by mouth at bedtime.     Multiple Vitamin (MULTIVITAMIN WITH MINERALS) TABS tablet Take 1 tablet by mouth every evening.     nystatin  (MYCOSTATIN /NYSTOP ) powder Apply 1 Application topically 3 (three) times daily. To skin under breasts. 60 g 1   oxyCODONE  (OXY IR/ROXICODONE ) 5 MG immediate release tablet Take 1 tablet (5 mg total) by mouth every 6 (six) hours as  needed for severe pain (pain score 7-10). 30 tablet 0   OZEMPIC, 0.25 OR 0.5 MG/DOSE, 2 MG/3ML SOPN Inject 0.25 mg into the skin every Monday.     pantoprazole  (PROTONIX ) 40 MG tablet Take 40 mg by mouth daily before breakfast.     predniSONE  (DELTASONE ) 5 MG tablet Take 5 mg by mouth in the morning.     QUEtiapine (SEROQUEL) 25 MG tablet Take 25 mg by mouth at bedtime.     rOPINIRole  (REQUIP ) 2 MG tablet Take 2 mg by mouth in the morning, at noon, and at bedtime.     senna-docusate (SENOKOT S) 8.6-50 MG tablet Take 2 tablets by mouth daily. 30 tablet 1   spironolactone (ALDACTONE) 25 MG tablet Take 12.5 mg by mouth every evening.     sucralfate (CARAFATE) 1 g tablet Take 1 tablet (1 g total) by mouth 3 (three) times daily before meals. 90 tablet 2   Turmeric 500 MG CAPS Take 500 mg by mouth every evening.     Vibegron  (GEMTESA ) 75 MG TABS Take 1 tablet (75 mg total) by mouth daily. 90 tablet 3   albuterol  (PROVENTIL ) (  2.5 MG/3ML) 0.083% nebulizer solution Take 2.5 mg by nebulization in the morning and at bedtime. (Patient not taking: Reported on 12/09/2023)     albuterol  (VENTOLIN  HFA) 108 (90 Base) MCG/ACT inhaler Inhale 2 puffs into the lungs 2 (two) times daily. (Patient not taking: Reported on 12/09/2023)     ondansetron  (ZOFRAN -ODT) 4 MG disintegrating tablet Take 1 tablet (4 mg total) by mouth every 8 (eight) hours as needed for nausea or vomiting. (Patient not taking: Reported on 12/09/2023) 20 tablet 0   TRELEGY ELLIPTA 100-62.5-25 MCG/ACT AEPB Inhale 1 puff into the lungs daily. (Patient not taking: Reported on 12/09/2023)     No current facility-administered medications for this visit.    VITAL SIGNS: BP 102/75   Pulse 80   Temp (!) 97.5 F (36.4 C) (Oral)   Resp 18   Ht 5' (1.524 m)   Wt 147 lb 8 oz (66.9 kg)   SpO2 92%   BMI 28.81 kg/m  Filed Weights   12/09/23 1322  Weight: 147 lb 8 oz (66.9 kg)    Estimated body mass index is 28.81 kg/m as calculated from the  following:   Height as of this encounter: 5' (1.524 m).   Weight as of this encounter: 147 lb 8 oz (66.9 kg).  LABS: CBC:    Component Value Date/Time   WBC 6.0 12/09/2023 1301   WBC 7.0 12/06/2023 1956   HGB 12.5 12/09/2023 1301   HGB 12.4 06/21/2014 0503   HCT 36.1 12/09/2023 1301   HCT 37.2 06/21/2014 0503   PLT 210 12/09/2023 1301   PLT 244 06/21/2014 0503   MCV 92.8 12/09/2023 1301   MCV 93 06/21/2014 0503   NEUTROABS 4.4 12/09/2023 1301   NEUTROABS 2.8 06/21/2014 0503   LYMPHSABS 0.5 (L) 12/09/2023 1301   LYMPHSABS 2.7 06/21/2014 0503   MONOABS 0.8 12/09/2023 1301   MONOABS 0.8 06/21/2014 0503   EOSABS 0.2 12/09/2023 1301   EOSABS 0.3 06/21/2014 0503   BASOSABS 0.0 12/09/2023 1301   BASOSABS 0.0 06/21/2014 0503   Comprehensive Metabolic Panel:    Component Value Date/Time   NA 133 (L) 12/09/2023 1300   NA 139 01/30/2015 0932   NA 138 06/21/2014 0503   K 3.6 12/09/2023 1300   K 3.9 06/21/2014 0503   CL 96 (L) 12/09/2023 1300   CL 107 06/21/2014 0503   CO2 27 12/09/2023 1300   CO2 27 06/21/2014 0503   BUN 30 (H) 12/09/2023 1300   BUN 17 01/30/2015 0932   BUN 21 (H) 06/21/2014 0503   CREATININE 1.40 (H) 12/09/2023 1300   CREATININE 1.22 (H) 06/21/2014 0503   GLUCOSE 100 (H) 12/09/2023 1300   GLUCOSE 100 (H) 06/21/2014 0503   CALCIUM 8.5 (L) 12/09/2023 1300   CALCIUM 8.6 (L) 06/21/2014 0503   AST 25 12/09/2023 1300   ALT 17 12/09/2023 1300   ALT 13 (L) 06/19/2014 0456   ALKPHOS 57 12/09/2023 1300   ALKPHOS 62 06/19/2014 0456   BILITOT 1.1 12/09/2023 1300   PROT 6.3 (L) 12/09/2023 1300   PROT 7.1 06/19/2014 0456   ALBUMIN 3.8 12/09/2023 1300   ALBUMIN 3.6 06/19/2014 0456    RADIOGRAPHIC STUDIES: CT ABDOMEN PELVIS W CONTRAST Result Date: 12/06/2023 CLINICAL DATA:  Chest and abdominal pain for several days EXAM: CT ANGIOGRAPHY CHEST CT ABDOMEN AND PELVIS WITH CONTRAST TECHNIQUE: Multidetector CT imaging of the chest was performed using the standard  protocol during bolus administration of intravenous contrast. Multiplanar CT image reconstructions and MIPs  were obtained to evaluate the vascular anatomy. Multidetector CT imaging of the abdomen and pelvis was performed using the standard protocol during bolus administration of intravenous contrast. RADIATION DOSE REDUCTION: This exam was performed according to the departmental dose-optimization program which includes automated exposure control, adjustment of the mA and/or kV according to patient size and/or use of iterative reconstruction technique. CONTRAST:  75mL OMNIPAQUE  IOHEXOL  350 MG/ML SOLN COMPARISON:  07/04/2023 FINDINGS: CTA CHEST FINDINGS Cardiovascular: Thoracic aorta shows atherosclerotic calcifications without aneurysmal dilatation or dissection. Heart is not significantly enlarged in size. Coronary calcifications are noted. Pulmonary artery shows a normal branching pattern without intraluminal filling defect. Mediastinum/Nodes: Thoracic inlet is within normal limits. No hilar or mediastinal adenopathy is noted. The esophagus shows diffuse wall thickening which may be related to reflux. Lungs/Pleura: Lungs are well aerated bilaterally. Stable right upper lobe pulmonary nodule is noted. No other nodules are seen. No focal infiltrate or sizable effusion is seen. Musculoskeletal: Degenerative changes of the thoracic spine are seen. No acute rib abnormality is noted. Vertebra plana is noted at T7. Associated T6 inferior endplate irregularity is noted as well. No acute bony abnormality is seen. Review of the MIP images confirms the above findings. CT ABDOMEN and PELVIS FINDINGS Hepatobiliary: No focal liver abnormality is seen. No gallstones, gallbladder wall thickening, or biliary dilatation. Calcifications are again noted along the periphery of the liver likely related to the peritoneum. Pancreas: Unremarkable. No pancreatic ductal dilatation or surrounding inflammatory changes. Spleen: Normal in size  without focal abnormality. Adrenals/Urinary Tract: Adrenal glands are within normal limits. Kidneys demonstrate a normal enhancement pattern bilaterally. Simple renal cysts are noted and stable. No follow-up is recommended. The bladder is well distended. Stomach/Bowel: Scattered fecal material is noted throughout the colon without obstructive change. The appendix is not well visualized. No inflammatory changes to suggest appendicitis are noted. Small bowel and stomach are within normal limits. Vascular/Lymphatic: Aortic atherosclerosis. No enlarged abdominal or pelvic lymph nodes. Reproductive: Status post hysterectomy. No adnexal masses. Other: No abdominal wall hernia or abnormality. No abdominopelvic ascites. Musculoskeletal: Degenerative changes of lumbar spine are noted. Review of the MIP images confirms the above findings. IMPRESSION: CTA of the chest: No evidence of pulmonary embolism. Stable right upper lobe pulmonary nodule. This showed no increased activity on recent PET-CT and is felt to be benign in etiology. Stable T7 compression fracture. CT of the abdomen and pelvis: No acute abnormality noted. Chronic changes similar to that seen on the prior exam. Electronically Signed   By: Oneil Devonshire M.D.   On: 12/06/2023 21:34   CT Angio Chest PE W and/or Wo Contrast Result Date: 12/06/2023 CLINICAL DATA:  Chest and abdominal pain for several days EXAM: CT ANGIOGRAPHY CHEST CT ABDOMEN AND PELVIS WITH CONTRAST TECHNIQUE: Multidetector CT imaging of the chest was performed using the standard protocol during bolus administration of intravenous contrast. Multiplanar CT image reconstructions and MIPs were obtained to evaluate the vascular anatomy. Multidetector CT imaging of the abdomen and pelvis was performed using the standard protocol during bolus administration of intravenous contrast. RADIATION DOSE REDUCTION: This exam was performed according to the departmental dose-optimization program which includes  automated exposure control, adjustment of the mA and/or kV according to patient size and/or use of iterative reconstruction technique. CONTRAST:  75mL OMNIPAQUE  IOHEXOL  350 MG/ML SOLN COMPARISON:  07/04/2023 FINDINGS: CTA CHEST FINDINGS Cardiovascular: Thoracic aorta shows atherosclerotic calcifications without aneurysmal dilatation or dissection. Heart is not significantly enlarged in size. Coronary calcifications are noted. Pulmonary artery shows a  normal branching pattern without intraluminal filling defect. Mediastinum/Nodes: Thoracic inlet is within normal limits. No hilar or mediastinal adenopathy is noted. The esophagus shows diffuse wall thickening which may be related to reflux. Lungs/Pleura: Lungs are well aerated bilaterally. Stable right upper lobe pulmonary nodule is noted. No other nodules are seen. No focal infiltrate or sizable effusion is seen. Musculoskeletal: Degenerative changes of the thoracic spine are seen. No acute rib abnormality is noted. Vertebra plana is noted at T7. Associated T6 inferior endplate irregularity is noted as well. No acute bony abnormality is seen. Review of the MIP images confirms the above findings. CT ABDOMEN and PELVIS FINDINGS Hepatobiliary: No focal liver abnormality is seen. No gallstones, gallbladder wall thickening, or biliary dilatation. Calcifications are again noted along the periphery of the liver likely related to the peritoneum. Pancreas: Unremarkable. No pancreatic ductal dilatation or surrounding inflammatory changes. Spleen: Normal in size without focal abnormality. Adrenals/Urinary Tract: Adrenal glands are within normal limits. Kidneys demonstrate a normal enhancement pattern bilaterally. Simple renal cysts are noted and stable. No follow-up is recommended. The bladder is well distended. Stomach/Bowel: Scattered fecal material is noted throughout the colon without obstructive change. The appendix is not well visualized. No inflammatory changes to suggest  appendicitis are noted. Small bowel and stomach are within normal limits. Vascular/Lymphatic: Aortic atherosclerosis. No enlarged abdominal or pelvic lymph nodes. Reproductive: Status post hysterectomy. No adnexal masses. Other: No abdominal wall hernia or abnormality. No abdominopelvic ascites. Musculoskeletal: Degenerative changes of lumbar spine are noted. Review of the MIP images confirms the above findings. IMPRESSION: CTA of the chest: No evidence of pulmonary embolism. Stable right upper lobe pulmonary nodule. This showed no increased activity on recent PET-CT and is felt to be benign in etiology. Stable T7 compression fracture. CT of the abdomen and pelvis: No acute abnormality noted. Chronic changes similar to that seen on the prior exam. Electronically Signed   By: Oneil Devonshire M.D.   On: 12/06/2023 21:34   DG Chest Portable 1 View Result Date: 12/06/2023 CLINICAL DATA:  Chest pain for 3 days, initial encounter EXAM: PORTABLE CHEST 1 VIEW COMPARISON:  07/30/2023 FINDINGS: Cardiac shadow is stable. Aortic calcifications are noted. Mild interstitial changes are again seen and stable. Mild left basilar atelectasis is seen. Old rib fractures are noted on the left. IMPRESSION: Mild left basilar atelectasis. Electronically Signed   By: Oneil Devonshire M.D.   On: 12/06/2023 20:27    PERFORMANCE STATUS (ECOG) : 2 - Symptomatic, <50% confined to bed  Review of Systems Unless otherwise noted, a complete review of systems is negative.  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  Patient only taking half a tablet of Carafate 3 times daily.  Will increase to a full tablet.  Will also increase pantoprazole  to twice daily dosing.  Will refill oxycodone .  Will proceed with IV fluids today.  If no improvement, discussed possible referral to GI for luminal evaluation.  RTC next week   Patient expressed understanding and was in agreement with this plan. She also understands that She can call clinic at any time with any questions, concerns, or complaints.   Thank  you for allowing me to participate in the care of this very pleasant patient.   Time Total: 25 minutes  Visit consisted of counseling and education dealing with the complex and emotionally intense issues of symptom management in the setting of serious illness.Greater than 50%  of this time was spent counseling and coordinating care related to the above assessment and plan.  Signed by: Fonda Mower, PhD, NP-C

## 2023-12-09 NOTE — Telephone Encounter (Signed)
 Radiation received a voice mail message from [place] stating Patient finished radiation last week on 10/7. Since then she has had increasing indigestion like pain in her chest, she is unable to eat, and has been spitting up clear thick mucous. She was seen in the ED this weekend and released. Can she be seen today or tomorrow for evaluation and treatment if necessary?  [name] will arrange an appt for her.

## 2023-12-12 ENCOUNTER — Ambulatory Visit
Admission: RE | Admit: 2023-12-12 | Discharge: 2023-12-12 | Disposition: A | Discharge status: Home / Self Care | Source: Ambulatory Visit | Attending: Oncology | Admitting: Oncology

## 2023-12-12 DIAGNOSIS — N200 Calculus of kidney: Secondary | ICD-10-CM | POA: Diagnosis not present

## 2023-12-12 DIAGNOSIS — I517 Cardiomegaly: Secondary | ICD-10-CM | POA: Diagnosis not present

## 2023-12-12 DIAGNOSIS — I700 Atherosclerosis of aorta: Secondary | ICD-10-CM | POA: Insufficient documentation

## 2023-12-12 DIAGNOSIS — M8588 Other specified disorders of bone density and structure, other site: Secondary | ICD-10-CM | POA: Insufficient documentation

## 2023-12-12 DIAGNOSIS — C8590 Non-Hodgkin lymphoma, unspecified, unspecified site: Secondary | ICD-10-CM | POA: Diagnosis present

## 2023-12-12 DIAGNOSIS — I2510 Atherosclerotic heart disease of native coronary artery without angina pectoris: Secondary | ICD-10-CM | POA: Diagnosis not present

## 2023-12-12 LAB — GLUCOSE, CAPILLARY: Glucose-Capillary: 69 mg/dL — ABNORMAL LOW (ref 70–99)

## 2023-12-12 MED ORDER — FLUDEOXYGLUCOSE F - 18 (FDG) INJECTION
8.1400 | Freq: Once | INTRAVENOUS | Status: AC | PRN
  Administered 2023-12-12: 8.14 via INTRAVENOUS

## 2023-12-16 ENCOUNTER — Other Ambulatory Visit: Payer: Self-pay | Admitting: Hospice and Palliative Medicine

## 2023-12-18 ENCOUNTER — Encounter: Payer: Self-pay | Admitting: Oncology

## 2023-12-19 ENCOUNTER — Inpatient Hospital Stay (HOSPITAL_BASED_OUTPATIENT_CLINIC_OR_DEPARTMENT_OTHER): Admitting: Oncology

## 2023-12-19 ENCOUNTER — Inpatient Hospital Stay

## 2023-12-19 ENCOUNTER — Encounter: Payer: Self-pay | Admitting: Oncology

## 2023-12-19 ENCOUNTER — Institutional Professional Consult (permissible substitution): Admitting: Radiation Oncology

## 2023-12-19 VITALS — BP 109/65 | HR 77 | Temp 97.9°F | Resp 18 | Wt 143.0 lb

## 2023-12-19 DIAGNOSIS — C8590 Non-Hodgkin lymphoma, unspecified, unspecified site: Secondary | ICD-10-CM | POA: Diagnosis not present

## 2023-12-19 DIAGNOSIS — R5383 Other fatigue: Secondary | ICD-10-CM | POA: Diagnosis not present

## 2023-12-19 DIAGNOSIS — N1832 Chronic kidney disease, stage 3b: Secondary | ICD-10-CM | POA: Diagnosis not present

## 2023-12-19 DIAGNOSIS — K208 Other esophagitis without bleeding: Secondary | ICD-10-CM

## 2023-12-19 DIAGNOSIS — C8280 Other types of follicular lymphoma, unspecified site: Secondary | ICD-10-CM | POA: Diagnosis not present

## 2023-12-19 LAB — CBC WITH DIFFERENTIAL (CANCER CENTER ONLY)
Abs Immature Granulocytes: 0.03 K/uL (ref 0.00–0.07)
Basophils Absolute: 0 K/uL (ref 0.0–0.1)
Basophils Relative: 1 %
Eosinophils Absolute: 0.3 K/uL (ref 0.0–0.5)
Eosinophils Relative: 5 %
HCT: 39.4 % (ref 36.0–46.0)
Hemoglobin: 13.2 g/dL (ref 12.0–15.0)
Immature Granulocytes: 1 %
Lymphocytes Relative: 12 %
Lymphs Abs: 0.7 K/uL (ref 0.7–4.0)
MCH: 31.5 pg (ref 26.0–34.0)
MCHC: 33.5 g/dL (ref 30.0–36.0)
MCV: 94 fL (ref 80.0–100.0)
Monocytes Absolute: 1 K/uL (ref 0.1–1.0)
Monocytes Relative: 16 %
Neutro Abs: 4 K/uL (ref 1.7–7.7)
Neutrophils Relative %: 65 %
Platelet Count: 230 K/uL (ref 150–400)
RBC: 4.19 MIL/uL (ref 3.87–5.11)
RDW: 14.8 % (ref 11.5–15.5)
WBC Count: 6 K/uL (ref 4.0–10.5)
nRBC: 0 % (ref 0.0–0.2)

## 2023-12-19 LAB — CMP (CANCER CENTER ONLY)
ALT: 21 U/L (ref 0–44)
AST: 28 U/L (ref 15–41)
Albumin: 4.1 g/dL (ref 3.5–5.0)
Alkaline Phosphatase: 62 U/L (ref 38–126)
Anion gap: 13 (ref 5–15)
BUN: 33 mg/dL — ABNORMAL HIGH (ref 8–23)
CO2: 28 mmol/L (ref 22–32)
Calcium: 8.4 mg/dL — ABNORMAL LOW (ref 8.9–10.3)
Chloride: 90 mmol/L — ABNORMAL LOW (ref 98–111)
Creatinine: 1.55 mg/dL — ABNORMAL HIGH (ref 0.44–1.00)
GFR, Estimated: 32 mL/min — ABNORMAL LOW (ref >60)
Glucose, Bld: 108 mg/dL — ABNORMAL HIGH (ref 70–99)
Potassium: 3.7 mmol/L (ref 3.5–5.1)
Sodium: 131 mmol/L — ABNORMAL LOW (ref 135–145)
Total Bilirubin: 0.9 mg/dL (ref 0.0–1.2)
Total Protein: 6.6 g/dL (ref 6.5–8.1)

## 2023-12-19 LAB — LACTATE DEHYDROGENASE: LDH: 238 U/L — ABNORMAL HIGH (ref 98–192)

## 2023-12-19 NOTE — Assessment & Plan Note (Signed)
 Improved. On soft diet

## 2023-12-19 NOTE — Progress Notes (Addendum)
 Hematology/Oncology Progress note [phone] [phone]      Patient Care Team: Sadie Manna, MD as PCP - General (Internal Medicine) Babara Call, MD as Consulting Physician (Oncology)  REFERRING PROVIDER: Dr. Sadie CHIEF COMPLAINTS/REASON FOR VISIT:  Follow up for lymphoma.  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 Previously remote history of follicular lymphoma. I discussed with pathology.   lymphoma NGS showed  negative BCL6, CCND1, IgH.BCL2 and MALT1. Overall clinically patient has stage IV non-Hodgkin's lymphoma, low-grade S/p  Ritxumiab weekly x 4  PET showed treatment response. Residual hypermetabolic 5 mm midline omental nodule  Recommend observation. Repeat PET in 4 months.  Will discuss with RadOnc to see if feasible additional RT  Chronic kidney disease (CKD), stage III (moderate) (HCC) Encourage oral hydration and avoid nephrotoxins.    Radiation esophagitis Improved. On soft diet  Fatigue Multifactorial. Suspect partially from underlying mood disorder/depression.  Will follow-up with primary care provider.   No orders of the defined types were placed in this encounter.  Follow-up 4 months.  All questions were answered. The patient knows to call the clinic with any problems, questions or concerns. We spent sufficient time to discuss many aspect of care, questions were answered to patient's satisfaction.  Call Babara, MD, PhD [facility] Hematology Oncology 12/19/2023   HISTORY OF PRESENTING ILLNESS:  [name] is a  89 y.o.  female with PMH listed below who was referred to me for evaluation of neck swelling, history of lymphoma. Patient reports remote history of lymphoma many years ago.  She recalls that she received chemotherapy at that time.  Followed up with Dr. Sheryn who retired  from our cancer center.  She was recently seen by PCP and has complained about her concerns of bilateral neck swelling for a while. She could not specify when she noticed this change. She reports feeling anxious as her initial symptoms which led to lymphoma diagnosis was neck LN swelling.   obtained patient's previous records in EMR by Dr.Choski. Patient has history of non hodgkin's lymphoma and s/p chemotherapy treatment. Her pathology was not available to me.   # 12/08/2017 US  thryoid done which showed left mid thyroid  nodule, 2.2 cm x 2.1cm x 2.1cm.  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 Afirma GSC benign, ROM 4%.  Patient was previously  referred to endocrinology for further management, and appears that patient never established care there.   09/21/2019, patient was evaluated in emergency room for confusion.  Due to the long wait time, patient left without seeing physician in the ED.  Patient had chest x-ray and CT head without contrast along with lab work.  Chest x-ray was negative and CT was negative for acute abnormality.  UA reviewed you urinary tract infection.  Patient presents for additional lab work and during waiting, patient was found to be confused and talk to herself.  She was approached by nursing staff and escorted to examining room for evaluation.  Patient was seen by symptom management nurse practitioner Delon.  Lab work also showed AKI with creatinine increased to 1.5. Patient was treated with supportive care with IV fluids nitrofurantoin  for 7 days for UTI.  Urine culture lateral positive for E. Coli  INTERVAL HISTORY [name] is a 89 y.o. female who has above history reviewed by me today presents for follow up visit for history of lymphoma.  Pathological compression, paraspinal soft tissue  mass. Accompanied by daughter [name] who is also patient's power of attorney.  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  Oncology History   Non-Hodgkin lymphoma (HCC)  06/05/2023 Imaging   CT abdomen pelvis with contrast showed Narrative & Impression  CLINICAL DATA:  Right-sided abdominal pain, history of non-Hodgkin lymphoma   EXAM: CT ABDOMEN AND PELVIS WITH CONTRAST   TECHNIQUE: Multidetector CT imaging of the abdomen and pelvis was performed using the standard protocol following bolus administration of intravenous contrast.   RADIATION DOSE REDUCTION: This exam was performed according to the departmental dose-optimization program which includes automated exposure control, adjustment of the mA and/or kV according to patient size and/or use of iterative reconstruction technique.   CONTRAST:  80mL OMNIPAQUE  IOHEXOL  300 MG/ML  SOLN   COMPARISON:  04/28/2020   FINDINGS: Lower chest: No acute pleural or parenchymal lung disease.   Hepatobiliary: No focal liver abnormality is seen. No gallstones, gallbladder wall thickening, or biliary dilatation.   Pancreas: Unremarkable. No pancreatic ductal dilatation or surrounding inflammatory changes.   Spleen: Normal in size without focal abnormality.   Adrenals/Urinary Tract: Mild bilateral renal cortical atrophy. No urinary tract calculi or obstructive uropathy. The adrenals and bladder are unremarkable.   Stomach/Bowel: No bowel obstruction or ileus. Diverticulosis of the descending and sigmoid colon. No evidence of acute diverticulitis. No bowel wall thickening or inflammatory change.   Vascular/Lymphatic: Aortic atherosclerosis. No enlarged abdominal or pelvic lymph nodes.   Reproductive: Status post hysterectomy. No adnexal masses.   Other: No free fluid or free intraperitoneal gas. No abdominal wall hernia.   Musculoskeletal: There is an age-indeterminate T7 compression deformity, with greater than 50% loss of height. Mild paraspinal soft tissue swelling at this location suggests acute to subacute fracture. While there is no significant retropulsion,  there does appear to be soft tissue density causing significant central canal stenosis. If further evaluation is desired, MRI may be useful.   No other acute bony abnormalities. Severe spondylosis and facet hypertrophy within the lower lumbar spine, most pronounced at L4-5 with significant central canal stenosis. This is a chronic stable finding. Reconstructed images demonstrate no additional findings.   IMPRESSION: 1. Likely acute to subacute T7 compression fracture, with greater than 50% loss of height. Prominent paraspinal soft tissue swelling is noted, as well as soft tissue density in the central canal likely contributing to significant central canal stenosis. Further evaluation with MRI may be useful. 2. Distal colonic diverticulosis without diverticulitis. 3.  Aortic Atherosclerosis (ICD10-I70.0).     06/16/2023 Imaging   MRI thoracic spine with and without contrast showed Tumor involving the T6-T9 vertebrae and paraspinal soft tissues with associated severe pathologic T7 compression fracture and epidural tumor resulting in moderate spinal stenosis.     07/04/2023 Initial Diagnosis   Non-Hodgkin lymphoma (HCC)  Patient reports remote history of non-Hodgkin lymphoma which was treated by Dr. Sheryn.  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  07/04/2023, patient presented to emergency room due to findings of abnormal CT findings. Paraspinal tumor involving T6/T9 vertebrae with associated severe compression fracture of the T7, moderate spinal stenosis.  Patient was seen by Dr. Clista during the hospitalization.  Patient was recommended to proceed with biopsy by IR outpatient.  07/14/2023 status post IR biopsy of T8 vertebral body Pathology showed atypical lymphoid infiltrate  consistent with B-cell lymphoproliferative  disorder.Morphologic evaluation of the bone biopsy reveals a monotonous atypical lymphoid infiltrate comprised of small to medium lymphocytes with monocytoid appearance.  Immunohistochemically, the atypical cells are positive for CD20, PAX5, CD79a and BCL2.  CD3 and CD5 highlight background T lymphocytes. CD10 and BCL6 highlight residual germinal centers.  CD138 and mum 1 highlight plasma cells.  TdT, CD34 and cyclin D1 are negative in the atypical cells.  CD43 appears to be positive in a subset of the atypical cells.  Kappa/lambda IHC are  noncontributory.  Overall the findings are consistent with a B-cell     lymphoproliferative disorder, particularly marginal zone lymphoma.  However amore definitive assessment is precluded by lack of clonality on  immunohistochemical staining and lack of separate tissue for flow cytometric analysis.  A lymphoma NGS panel will be performed and results will be reported in an addendum.      07/04/2023 Imaging   CT chest abdomen pelvis with contrast 1. Similar severe compression of T7 with surrounding paraspinal soft tissue thickening, better evaluated on prior MRI. 2. Unchanged 4 mm right apical nodule, likely benign. 2 mm bilateral lower lobe nodules, not definitely seen on prior examination, indeterminate. 3. No evidence of metastatic disease in the abdomen or pelvis. 4. Aortic Atherosclerosis (ICD10-I70.0). Coronary artery calcifications. Assessment for potential risk factor modification, dietary therapy or pharmacologic therapy may be warranted, if clinically indicated.     07/28/2023 Imaging   PET scan showed 1. Hypermetabolic lymphoma involving abdominal retroperitoneal and right pelvic lymph nodes as well as midthoracic spine (Deauville 5). 2. Hypermetabolic soft tissue thickening deep to the upper abdominal midline ventral wall, possibly due to lymphoma. 3. Punctate right renal stone. 4. Chronic calcific  pancreatitis. 5. Aortic atherosclerosis (ICD10-I70.0). Coronary artery calcification.   07/31/2023 Bone Marrow Biopsy   Bone marrow biopsy -  Overall normocellular bone marrow with orderly trilineage hematopoiesis.  PERIPHERAL BLOOD: -  Unremarkable smear review.  Note: It is noted the patient has a remote history of a lymphoma (2002 follicular lymphoma per discussion with Dr. Babara with a recent (07/14/2023) diagnosis of an atypical lymphoid infiltrate consistent with a B-cell lymphoproliferative disorder of the T8 vertebral body.  Lymphocytes are not increased on the aspirate smear slides as well as by immunohistochemical stains of the clot section; however, the clot section on the HE stained slide did highlight a couple small well-circumscribed lymphoid aggregates that were lost on levels and the IHC stains.  These well-circumscribed lymphoid aggregates, however, are within the realm of reactive lymphoid aggregates given their circumscription and age of patient.  Definitive bone marrow involvement by a lymphoproliferative disorder is not identified morphologically or immunohistochemically.    08/04/2023 Cancer Staging   Staging form: Hodgkin and Non-Hodgkin Lymphoma, AJCC 8th Edition - Clinical stage from 08/04/2023: Stage IV (Follicular lymphoma) - Signed by Babara Call, MD on 08/04/2023 Stage prefix: Recurrence   08/15/2023 -  Chemotherapy   Patient is on Treatment Plan : NON-HODGKINS LYMPHOMA Rituximab  q7d     12/15/2023 Imaging   PET scan showed 1. Residual but decreased hypermetabolism within a ventral omental nodule. Otherwise, no additional evidence of FDG avid lymphoma. 2. Foci of hypermetabolism along the lower thoracic spine and a posterior right rib without CT correlates. Recommend attention on follow-up. 3. Punctate right renal stone. 4. Aortic atherosclerosis (ICD10-I70.0). Coronary artery calcification.    Obtained previous medical records. She had left axillary lymph node  excisional biopsy in February 2002.  Pathology showed follicular lymphoma, WHO grade 2/3 mixed small cleaved  and large cell.  04/24/2020, bone biopsy showed involvement of malignant lymphoma.  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 Patient received R-CHOP treatments and radiation.   INTERVAL HISTORY KENLEI SAFI is a 89 y.o. female who has above history reviewed by me today presents for follow up visit for recurrent follicular lymphoma.  Patient reports not feeling well. Continue to have fatigue.  She feels hard belly. S/p radiation to paraspinal mass. 12/06/2023, ED visit due to chest pain/nausea, spitting up.  ED workup, EKG sinus rhythm.  CTA chest showed no evidence of pulmonary embolism.  Suspect due to radiation esophagitis from radiation.  Symptoms are better.  She is currently taking soft food.  Review of Systems  Constitutional:  Positive for fatigue. Negative for appetite change, chills, fever and unexpected weight change.  HENT:   Negative for hearing loss and voice change.        Blurry vision  Eyes:  Negative for eye problems.  Respiratory:  Negative for chest tightness and cough.   Cardiovascular:  Negative for chest pain.  Gastrointestinal:  Negative for abdominal distention, abdominal pain and blood in stool.       Bloating  Endocrine: Negative for hot flashes.  Genitourinary:  Negative for difficulty urinating and frequency.   Musculoskeletal:  Negative for arthralgias.  Skin:  Negative for itching.  Neurological:  Negative for extremity weakness.  Hematological:  Negative for adenopathy.  Psychiatric/Behavioral:  Negative for confusion.      MEDICAL HISTORY:  Past Medical History:  Diagnosis Date   Afib (HCC)    a.) CHA2DS2-VASc = 6 (age x 2, sex, CHF, HTN, aortic plaque). b.) rate/rhythm maintained without pharmacological intervention; she is not on oral anticoagulation   Anxiety    Aortic  atherosclerosis    Arthritis    Basal cell carcinoma 11/11/2017   Right inferior knee. Superficial and nodular patterns.   Bilateral lower extremity edema    Chest pain 07/30/2023   CKD (chronic kidney disease), stage III (HCC)    Congestive heart failure (CHF) (HCC) 05/31/2014   a.) TTE 05/31/2014: EF >55%, RVE; LAE; triv panvalvular regurg; G1DD. b.) TTE 06/29/2017: EF >55%; RVE; BAE; G1DD. c.) TTE 05/09/2018: EF 55-60%; LAE; mod MR, mild AR/TR. d.) TTE 04/29/2020: EF 60-65%; mi;d-mod MR/TR; RVSP 36.6 mmHg. e.) TTE 08/24/2020: EF >55%; mild LVH; mild MR/TR/PR; sev MAC.   COPD (chronic obstructive pulmonary disease) (HCC)    Coronary artery disease 06/03/2008   a.) LHC 06/03/2008: 40% pLAD-1, 60% pLAD-2, 90% mLAD-1, 30% mLAD-2, 50% mLAD-3 --> PCI placing a 2.0 x 12 mm Mini-Vision BMS x 1 to mLAD-1 lesion. b.) LHC 02/12/10: 50% pLAD, 30% ISR mLAD, 25% dLAD, 25% pLCx, 30% mLCx, 20% pRCA, 20% D1; further intervention deferred opting for medical mgmt.   DDD (degenerative disc disease), lumbar    Depression    Dyspnea 07/30/2023   GERD (gastroesophageal reflux disease)    HOH (hard of hearing)    a.) uses BILATERAL hearing aids   Hurthle cell neoplasm of thyroid     a.) s/p total thyroidectomy 11/17/2019   Hyperlipemia    Hypertension    Hypothyroidism    Lumbar spinal stenosis    Memory loss    Non Hodgkin's lymphoma (HCC) 2002   Squamous cell carcinoma of skin 06/13/2020   L mid pretibia, biopsy only   Urinary incontinence     SURGICAL HISTORY: Past Surgical History:  Procedure Laterality Date  CORONARY ANGIOPLASTY WITH STENT PLACEMENT Left 06/03/2008   Procedure: CORONARY ANGIOPLASTY WITH STENT PLACEMENT; Location: [facility]; Surgeon(s): Wolm Rhyme, MD (diagnostic) and Margie Lovelace, MD (interventional)   EXCISION OF ABDOMINAL WALL TUMOR Right 05/08/2018   Procedure: EXCISION OF ABDOMINAL WALL-RIGHT;  Surgeon: Rodolph Romano, MD;  Location: [facility] ORS;  Service: General;   Laterality: Right;   INCISIONAL HERNIA REPAIR N/A 05/08/2018   Procedure: LAPAROSCOPIC VS. OPEN INCISIONAL HERNIA REPAIR WITH MESH;  Surgeon: Rodolph Romano, MD;  Location: [facility] ORS;  Service: General;  Laterality: N/A;   IR BONE MARROW BIOPSY & ASPIRATION  07/31/2023   IR CT BONE TROCAR/NEEDLE BIOPSY DEEP  07/14/2023   LEFT HEART CATH AND CORONARY ANGIOGRAPHY Left 02/12/2010   Procedure: LEFT HEART CATH AND CORONARY ANGIOGRAPHY; Location: [facility]; Surgeon: Wolm Rhyme, MD   LESION REMOVAL Left 08/22/2021   Procedure: LESION REMOVAL (KNEE);  Surgeon: Jama Cordella MATSU, MD;  Location: [facility] OR[facility];  Service: Vascular;  Laterality: Left;   RIGHT/LEFT HEART CATH AND CORONARY ANGIOGRAPHY Bilateral 06/13/2023   Procedure: RIGHT/LEFT HEART CATH AND CORONARY ANGIOGRAPHY;  Surgeon: Lovelace Cara BIRCH, MD;  Location: [facility] INV[facility]IVE CV [facility];  Service: Cardiovascular;  Laterality: Bilateral;   THYROIDECTOMY Bilateral 11/17/2019   Procedure: TOTAL THYROIDECTOMY;  Surgeon: Edda Mt, MD;  Location: [facility] OR[facility];  Service: ENT;  Laterality: Bilateral;   TOTAL ABDOMINAL HYSTERECTOMY N/A    VEIN SURGERY     Endovenous ablation of saphenous vein    SOCIAL HISTORY: Social History   Socioeconomic History   Marital status: Single    Spouse name: Not on file   Number of children: Not on file   Years of education: Not on file   Highest education level: Not on file  Occupational History   Occupation: retired  Tobacco Use   Smoking status: Never    Passive exposure: Never   Smokeless tobacco: Never  Vaping Use   Vaping status: Never Used  Substance and Sexual Activity   Alcohol use: No   Drug use: No   Sexual activity: Not on file  Other Topics Concern   Not on file  Social History Narrative   Lives at home independently. Has a cane to ambulate occasionally. Continues to drive.   Social Drivers of Corporate investment banker Strain: Low Risk  (04/22/2023)   Received from [facility]   Overall Financial Resource Strain (CARDIA)    Difficulty of Paying Living Expenses: Not hard at all  Food Insecurity: No Food Insecurity (07/04/2023)   Hunger Vital Sign    Worried About Running Out of Food in the Last Year: Never true    Ran Out of Food in the Last Year: Never true  Transportation Needs: No Transportation Needs (07/04/2023)   PRAPARE - Administrator, Civil Service (Medical): No    Lack of Transportation (Non-Medical): No  Physical Activity: Not on file  Stress: Not on file  Social Connections: Socially Isolated (07/04/2023)   Social Connection and Isolation Panel    Frequency of Communication with Friends and Family: More than three times a week    Frequency of Social Gatherings with Friends and Family: More than three times a week    Attends Religious Services: Never    Database administrator or Organizations: No    Attends Banker Meetings: Never    Marital Status: Widowed  Intimate Partner Violence: Not At Risk (07/04/2023)   Humiliation, Afraid, Rape, and Kick questionnaire    Fear  of Current or Ex-Partner: No    Emotionally Abused: No    Physically Abused: No    Sexually Abused: No    FAMILY HISTORY: Family History  Problem Relation Age of Onset   Heart Problems Mother    Heart Problems Father    Heart attack Father    Diabetes Brother    Diabetes Brother     ALLERGIES:  is allergic to cefdinir, pregabalin, and sulfa antibiotics.  MEDICATIONS:  Current Outpatient Medications  Medication Sig Dispense Refill   acetaminophen  (TYLENOL ) 500 MG tablet Take 500 mg by mouth every 6 (six) hours as needed (pain.).     albuterol  (PROVENTIL ) (2.5 MG/3ML) 0.083% nebulizer solution Take 2.5 mg by nebulization in the morning and at bedtime.     albuterol  (VENTOLIN  HFA) 108 (90 Base) MCG/ACT inhaler Inhale 2 puffs into the lungs 2 (two) times daily.     Calcium Citrate-Vitamin D (CALCIUM CITRATE + D3 PO) Take 1 tablet by mouth in the  morning and at bedtime.     Cyanocobalamin (VITAMIN B 12 PO) Take 2,500 mcg by mouth in the morning.     DULoxetine  (CYMBALTA ) 20 MG capsule Take 40 mg by mouth in the morning.     furosemide  (LASIX ) 20 MG tablet Take 20 mg by mouth in the morning.     isosorbide mononitrate (IMDUR) 60 MG 24 hr tablet Take 60 mg by mouth at bedtime.     levothyroxine  (SYNTHROID ) 112 MCG tablet Take 112 mcg by mouth every other day. TAKE ONE TABLET EVERY OTHER DAY, ALTERNATING WITH 125 MCG DOSE     levothyroxine  (SYNTHROID ) 125 MCG tablet Take 125 mcg by mouth every other day. TAKE ONE TABLET EVERY OTHER DAY, ALTERNATING WITH 112 MCG DOSE     liothyronine  (CYTOMEL ) 5 MCG tablet Take 5 mcg by mouth daily before breakfast.     Magnesium  250 MG TABS Take 200 mg by mouth every evening.     metoprolol tartrate (LOPRESSOR) 25 MG tablet Take 25 mg by mouth 2 (two) times daily.     montelukast (SINGULAIR) 10 MG tablet Take 10 mg by mouth at bedtime.     Multiple Vitamin (MULTIVITAMIN WITH MINERALS) TABS tablet Take 1 tablet by mouth every evening.     nystatin  (MYCOSTATIN /NYSTOP ) powder Apply 1 Application topically 3 (three) times daily. To skin under breasts. 60 g 1   ondansetron  (ZOFRAN -ODT) 4 MG disintegrating tablet Take 1 tablet (4 mg total) by mouth every 8 (eight) hours as needed for nausea or vomiting. 20 tablet 0   oxyCODONE  (OXY IR/ROXICODONE ) 5 MG immediate release tablet Take 1 tablet (5 mg total) by mouth every 6 (six) hours as needed for severe pain (pain score 7-10). 45 tablet 0   OZEMPIC, 0.25 OR 0.5 MG/DOSE, 2 MG/3ML SOPN Inject 0.25 mg into the skin every Monday.     pantoprazole  (PROTONIX ) 40 MG tablet TAKE 1 TABLET BY MOUTH TWICE A DAY 180 tablet 0   predniSONE  (DELTASONE ) 5 MG tablet Take 5 mg by mouth in the morning.     QUEtiapine (SEROQUEL) 25 MG tablet Take 25 mg by mouth at bedtime.     rOPINIRole  (REQUIP ) 2 MG tablet Take 2 mg by mouth in the morning, at noon, and at bedtime.     senna-docusate  (SENOKOT S) 8.6-50 MG tablet Take 2 tablets by mouth daily. 30 tablet 1   spironolactone (ALDACTONE) 25 MG tablet Take 12.5 mg by mouth every evening.     sucralfate (  CARAFATE) 1 g tablet Take 1 tablet (1 g total) by mouth 3 (three) times daily before meals. 90 tablet 2   TRELEGY ELLIPTA 100-62.5-25 MCG/ACT AEPB Inhale 1 puff into the lungs daily.     Turmeric 500 MG CAPS Take 500 mg by mouth every evening.     Vibegron  (GEMTESA ) 75 MG TABS Take 1 tablet (75 mg total) by mouth daily. 90 tablet 3   No current facility-administered medications for this visit.     PHYSICAL EXAMINATION: ECOG PERFORMANCE STATUS: 1 - Symptomatic but completely ambulatory Vitals:   12/19/23 1202 12/19/23 1205  BP: (!) 121/103 109/65  Pulse:  77  Resp: 18   Temp: 97.9 F (36.6 C)    Filed Weights   12/19/23 1202  Weight: 143 lb (64.9 kg)    Physical Exam Constitutional:      General: She is not in acute distress.    Comments: Frail appearance female  HENT:     Head: Normocephalic and atraumatic.  Eyes:     General: No scleral icterus. Cardiovascular:     Rate and Rhythm: Normal rate and regular rhythm.  Pulmonary:     Effort: Pulmonary effort is normal. No respiratory distress.     Breath sounds: No wheezing.  Abdominal:     General: Bowel sounds are normal.     Palpations: Abdomen is soft.     Tenderness: There is no abdominal tenderness.  Musculoskeletal:        General: No deformity. Normal range of motion.     Cervical back: Normal range of motion and neck supple.  Skin:    General: Skin is warm and dry.     Findings: No erythema or rash.  Neurological:     Mental Status: She is alert and oriented to person, place, and time. Mental status is at baseline.      LABORATORY DATA:  I have reviewed the data as listed Lab Results  Component Value Date   WBC 6.0 12/19/2023   HGB 13.2 12/19/2023   HCT 39.4 12/19/2023   MCV 94.0 12/19/2023   PLT 230 12/19/2023   Recent Labs     12/06/23 1956 12/09/23 1300 12/19/23 1142  NA 136 133* 131*  K 4.2 3.6 3.7  CL 94* 96* 90*  CO2 27 27 28   GLUCOSE 102* 100* 108*  BUN 34* 30* 33*  CREATININE 1.46* 1.40* 1.55*  CALCIUM 9.1 8.5* 8.4*  GFRNONAA 34* 36* 32*  PROT 7.7 6.3* 6.6  ALBUMIN 4.4 3.8 4.1  AST 32 25 28  ALT 19 17 21   ALKPHOS 66 57 62  BILITOT 1.0 1.1 0.9

## 2023-12-19 NOTE — Assessment & Plan Note (Signed)
 Multifactorial. Suspect partially from underlying mood disorder/depression.  Will follow-up with primary care provider.

## 2023-12-19 NOTE — Progress Notes (Signed)
 Patient here for follow up. She reports that she had been having trouble swallowing due to radiation

## 2023-12-19 NOTE — Assessment & Plan Note (Signed)
 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 Previously remote history of follicular lymphoma. I discussed with pathology.   lymphoma NGS showed  negative BCL6, CCND1, IgH.BCL2 and MALT1. Overall clinically patient has stage IV non-Hodgkin's lymphoma, low-grade S/p  Ritxumiab weekly x 4  PET showed treatment response. Residual hypermetabolic 5 mm midline omental nodule  Recommend observation. Repeat PET in 4 months.  Will discuss with RadOnc to see if feasible additional RT

## 2023-12-19 NOTE — Assessment & Plan Note (Signed)
 Encourage oral hydration and avoid nephrotoxins.

## 2023-12-22 ENCOUNTER — Encounter: Payer: Self-pay | Admitting: Physician Assistant

## 2023-12-22 ENCOUNTER — Telehealth: Payer: Self-pay

## 2023-12-22 ENCOUNTER — Ambulatory Visit (INDEPENDENT_AMBULATORY_CARE_PROVIDER_SITE_OTHER): Admitting: Physician Assistant

## 2023-12-22 VITALS — BP 144/85 | HR 75 | Ht <= 58 in | Wt 142.4 lb

## 2023-12-22 DIAGNOSIS — N3946 Mixed incontinence: Secondary | ICD-10-CM

## 2023-12-22 DIAGNOSIS — C8590 Non-Hodgkin lymphoma, unspecified, unspecified site: Secondary | ICD-10-CM

## 2023-12-22 NOTE — Telephone Encounter (Signed)
 Spoke to pt's daughter [name] and informed her of MD follow up plan. She would like a call with appt details  4 month: PET scan a few days prior to Lab/MD (cbc,cmp, ldh)

## 2023-12-22 NOTE — Progress Notes (Signed)
 PTNS  Session # 39  Health & Social Factors: just completed radiation treatments Caffeine: 0 Alcohol: 0 Daytime voids #per day: 8-9 Night-time voids #per night: 7 Urgency: strong Incontinence Episodes #per day: 0 Ankle used: right Treatment Setting: 4 Feeling/ Response: both Comments:  Patient tolerated well.  Performed By: Tiari Andringa, PA-C   Follow Up: 1 month

## 2023-12-22 NOTE — Telephone Encounter (Signed)
-----   Message from Zelphia Cap sent at 12/21/2023 10:32 AM EDT ----- Please let patient and daughter know that I have discussed with Dr. Lenn, recommend observation, no RT for now  Recommend repeat PET in 4 months  prior to lab MD cbc cmp LDH>  Thank you .    zy

## 2023-12-23 ENCOUNTER — Other Ambulatory Visit: Payer: Self-pay

## 2024-01-08 ENCOUNTER — Ambulatory Visit: Admitting: Radiation Oncology
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
## 2024-01-12 ENCOUNTER — Ambulatory Visit
Admission: RE | Admit: 2024-01-12 | Discharge: 2024-01-12 | Disposition: A | Discharge status: Home / Self Care | Source: Ambulatory Visit | Attending: Radiation Oncology | Admitting: Radiation Oncology

## 2024-01-12 ENCOUNTER — Encounter: Payer: Self-pay | Admitting: Radiation Oncology

## 2024-01-12 VITALS — BP 122/91 | HR 81 | Temp 98.0°F | Resp 15 | Ht <= 58 in

## 2024-01-12 DIAGNOSIS — C8589 Other specified types of non-Hodgkin lymphoma, extranodal and solid organ sites: Secondary | ICD-10-CM | POA: Diagnosis present

## 2024-01-12 DIAGNOSIS — Z923 Personal history of irradiation: Secondary | ICD-10-CM | POA: Diagnosis not present

## 2024-01-12 DIAGNOSIS — C7951 Secondary malignant neoplasm of bone: Secondary | ICD-10-CM

## 2024-01-12 NOTE — Progress Notes (Signed)
 Radiation Oncology Follow up Note  Name: [name]   Date:   01/12/2024 MRN:  [id number] DOB: 11/13/1932    This 89 y.o. female presents to the clinic today for 1 month follow-up status post radiation therapy to her thoracic spine for involvement of non-Hodgkin's lymphoma.  REFERRING PROVIDER: Sadie Manna, MD  HPI: Patient is an 89 year old female now out 1 month having completed palliative radiation therapy to her thoracic spine for involvement of B-cell lymphoproliferative disorder possibly marginal zone lymphoma.  She is seen today in routine follow-up she had difficulty with radiation esophagitis after her initial course of radiation to her thoracic spine which has resolved.  She is currently under observation.  Her last PET CT scan done.  1017 showed a residual but decreased hypermetabolic activity within the ventral omental nodule otherwise no additional evidence of FDG avid lymphoma.  She is fairly asymptomatic at this time.  She states her back is sore but pain is improved.  The omental nodule I discussed with Dr. Babara would continue to observe that at this time.  COMPLICATIONS OF TREATMENT: none  FOLLOW UP COMPLIANCE: keeps appointments   PHYSICAL EXAM:  BP (!) 122/91   Pulse 81   Temp 98 F (36.7 C)   Resp 15   Ht 4' 9 (1.448 m)   BMI 30.82 kg/m  Wheelchair-bound elderly female in NAD.  Lungs are clear the palpation of her spine does not elicit pain.  Motor and sensory levels appear equal symmetric in the lower extremities.  RADIOLOGY RESULTS: PET scan reviewed compatible with above-stated findings  PLAN: At this time I will continue to observe the patient.  She has a PET/CT in February I will see her about a week after that.  Should the omental area continue to enlarge may offer short course of radiation therapy at that time.  Patient comprehends her recommendations well.  I would like to take this opportunity to thank you for allowing me to participate in the  care of your patient.SABRA Marcey Penton, MD

## 2024-01-13 ENCOUNTER — Other Ambulatory Visit: Payer: Self-pay

## 2024-01-19 ENCOUNTER — Ambulatory Visit (INDEPENDENT_AMBULATORY_CARE_PROVIDER_SITE_OTHER): Admitting: Physician Assistant

## 2024-01-19 ENCOUNTER — Encounter: Payer: Self-pay | Admitting: Physician Assistant

## 2024-01-19 VITALS — BP 117/81 | HR 82

## 2024-01-19 DIAGNOSIS — N3946 Mixed incontinence: Secondary | ICD-10-CM | POA: Diagnosis not present

## 2024-01-19 NOTE — Progress Notes (Signed)
 PTNS  Session # 40  Health & Social Factors: no change Caffeine: 0 Alcohol: 0 Daytime voids #per day: 7 Night-time voids #per night: 4 Urgency: strong Incontinence Episodes #per day: 2-3 Ankle used: right Treatment Setting: 6 Feeling/ Response: toe flex Comments: Patient tolerated well.  Performed By: Lunette Tapp, PA-C   Follow Up: 1 month

## 2024-01-21 ENCOUNTER — Ambulatory Visit: Admitting: Physician Assistant

## 2024-02-03 ENCOUNTER — Ambulatory Visit: Admitting: Dermatology

## 2024-02-03 DIAGNOSIS — D1801 Hemangioma of skin and subcutaneous tissue: Secondary | ICD-10-CM

## 2024-02-03 DIAGNOSIS — L820 Inflamed seborrheic keratosis: Secondary | ICD-10-CM | POA: Diagnosis not present

## 2024-02-03 DIAGNOSIS — Z1283 Encounter for screening for malignant neoplasm of skin: Secondary | ICD-10-CM

## 2024-02-03 DIAGNOSIS — L570 Actinic keratosis: Secondary | ICD-10-CM

## 2024-02-03 DIAGNOSIS — D239 Other benign neoplasm of skin, unspecified: Secondary | ICD-10-CM

## 2024-02-03 DIAGNOSIS — Z85828 Personal history of other malignant neoplasm of skin: Secondary | ICD-10-CM

## 2024-02-03 DIAGNOSIS — D229 Melanocytic nevi, unspecified: Secondary | ICD-10-CM

## 2024-02-03 DIAGNOSIS — I872 Venous insufficiency (chronic) (peripheral): Secondary | ICD-10-CM

## 2024-02-03 DIAGNOSIS — L821 Other seborrheic keratosis: Secondary | ICD-10-CM

## 2024-02-03 DIAGNOSIS — D692 Other nonthrombocytopenic purpura: Secondary | ICD-10-CM

## 2024-02-03 DIAGNOSIS — L814 Other melanin hyperpigmentation: Secondary | ICD-10-CM

## 2024-02-03 DIAGNOSIS — L578 Other skin changes due to chronic exposure to nonionizing radiation: Secondary | ICD-10-CM

## 2024-02-03 MED ORDER — TRIAMCINOLONE ACETONIDE 0.1 % EX CREA: TOPICAL_CREAM | CUTANEOUS | 1 refills | Status: AC

## 2024-02-03 NOTE — Patient Instructions (Signed)

## 2024-02-03 NOTE — Progress Notes (Signed)
 Follow-Up Visit   Subjective  [name] is a 89 y.o. female who presents for the following: Skin Cancer Screening and Full Body Skin Exam  The patient presents for Total-Body Skin Exam (TBSE) for skin cancer screening and mole check. The patient has spots, moles and lesions to be evaluated, some may be new or changing and the patient may have concern these could be cancer.  She has several irritated spots on shoulders.    The following portions of the chart were reviewed this encounter and updated as appropriate: medications, allergies, medical history  Review of Systems:  No other skin or systemic complaints except as noted in HPI or Assessment and Plan.  Objective  Well appearing patient in no apparent distress; mood and affect are within normal limits.  A full examination was performed including scalp, head, eyes, ears, nose, lips, neck, chest, axillae, abdomen, back, buttocks, bilateral upper extremities, bilateral lower extremities, hands, feet, fingers, toes, fingernails, and toenails. All findings within normal limits unless otherwise noted below.   Relevant physical exam findings are noted in the Assessment and Plan.  R med shoulder x 2, L top of shoulder x 1 (3) Erythematous stuck-on, waxy papule left malar cheekx1 Erythematous thin papules/macules with gritty scale.   Assessment & Plan   SKIN CANCER SCREENING PERFORMED TODAY.  ACTINIC DAMAGE - Chronic condition, secondary to cumulative UV/sun exposure - diffuse scaly erythematous macules with underlying dyspigmentation - Recommend daily broad spectrum sunscreen SPF 30+ to sun-exposed areas, reapply every 2 hours as needed.  - Staying in the shade or wearing long sleeves, sun glasses (UVA+UVB protection) and wide brim hats (4-inch brim around the entire circumference of the hat) are also recommended for sun protection.  - Call for new or changing lesions.  LENTIGINES, SEBORRHEIC KERATOSES, HEMANGIOMAS - Benign  normal skin lesions - Benign-appearing - Call for any changes  MELANOCYTIC NEVI - Tan-brown and/or pink-flesh-colored symmetric macules and papules - Benign appearing on exam today - Observation - Call clinic for new or changing moles - Recommend daily use of broad spectrum spf 30+ sunscreen to sun-exposed areas.   HISTORY OF BASAL CELL CARCINOMA OF THE SKIN - No evidence of recurrence today - Recommend regular full body skin exams - Recommend daily broad spectrum sunscreen SPF 30+ to sun-exposed areas, reapply every 2 hours as needed.  - Call if any new or changing lesions are noted between office visits  HISTORY OF SQUAMOUS CELL CARCINOMA OF THE SKIN - No evidence of recurrence today - No lymphadenopathy - Recommend regular full body skin exams - Recommend daily broad spectrum sunscreen SPF 30+ to sun-exposed areas, reapply every 2 hours as needed.  - Call if any new or changing lesions are noted between office visits  STASIS DERMATITIS Exam: Erythema and pitting edema of the lower legs with associated varicosities, pt c/o itching  Chronic and persistent condition with duration or expected duration over one year. Condition is symptomatic/ bothersome to patient. Not currently at goal.   Stasis in the legs causes chronic leg swelling, which may result in itchy or painful rashes, skin discoloration, skin texture changes, and sometimes ulceration.  Recommend daily graduated compression hose/stockings- easiest to put on first thing in morning, remove at bedtime.  Elevate legs as much as possible. Avoid salt/sodium rich foods.  Treatment Plan: Continue compression socks daily. Keep feet elevated when sitting.   Start TMC 0.1% Cream Apply to rash on lower legs BID until improved, no longer than 2 weeks.  Avoid face, groin, axilla.  Caution skin atrophy with long-term use.   Topical steroids (such as triamcinolone , fluocinolone, fluocinonide, mometasone, clobetasol , halobetasol,  betamethasone, hydrocortisone) can cause thinning and lightening of the skin if they are used for too long in the same area. Your physician has selected the right strength medicine for your problem and area affected on the body. Please use your medication only as directed by your physician to prevent side effects.    Scar from inflamed cyst vs Dermatofibroma Exam: 1.5 x 1.0 cm pink tan firm depressed nodule L med knee, h/o inflamed cyst  Treatment Plan: A dermatofibroma is a benign growth possibly related to trauma, such as an insect bite, cut from shaving, or inflamed acne-type bump.  Treatment options to remove include shave or excision with resulting scar and risk of recurrence.  Since benign-appearing and not bothersome, will observe for now.    Callus/corn of the right 2nd toe -  Reassured benign, recommend OTC padding to prevent friction/rubbing.  Purpura - Chronic; persistent and recurrent.  Treatable, but not curable. - Violaceous macules and patches - Benign - Related to trauma, age, sun damage and/or use of blood thinners, chronic use of topical and/or oral steroids - Observe - Can use OTC arnica containing moisturizer such as Dermend Bruise Formula if desired - Call for worsening or other concerns INFLAMED SEBORRHEIC KERATOSIS (3) R med shoulder x 2, L top of shoulder x 1 (3) Symptomatic, irritating, patient would like treated. Destruction of lesion - R med shoulder x 2, L top of shoulder x 1 (3)  Destruction method: cryotherapy   Informed consent: discussed and consent obtained   Timeout:  patient name, date of birth, surgical site, and procedure verified Lesion destroyed using liquid nitrogen: Yes   Region frozen until ice ball extended beyond lesion: Yes   Cryo cycles: 1 or 2. Outcome: patient tolerated procedure well with no complications   Post-procedure details: wound care instructions given   Additional details:  Prior to procedure, discussed risks of blister  formation, small wound, skin dyspigmentation, or rare scar following cryotherapy. Recommend Vaseline ointment to treated areas while healing.   AK (ACTINIC KERATOSIS) left malar cheekx1 Vs ISK, second treatment  Actinic keratoses are precancerous spots that appear secondary to cumulative UV radiation exposure/sun exposure over time. They are chronic with expected duration over 1 year. A portion of actinic keratoses will progress to squamous cell carcinoma of the skin. It is not possible to reliably predict which spots will progress to skin cancer and so treatment is recommended to prevent development of skin cancer.  Recommend daily broad spectrum sunscreen SPF 30+ to sun-exposed areas, reapply every 2 hours as needed.  Recommend staying in the shade or wearing long sleeves, sun glasses (UVA+UVB protection) and wide brim hats (4-inch brim around the entire circumference of the hat). Call for new or changing lesions. Destruction of lesion - left malar cheekx1  Destruction method: cryotherapy   Informed consent: discussed and consent obtained   Lesion destroyed using liquid nitrogen: Yes   Region frozen until ice ball extended beyond lesion: Yes   Outcome: patient tolerated procedure well with no complications   Post-procedure details: wound care instructions given   Additional details:  Prior to procedure, discussed risks of blister formation, small wound, skin dyspigmentation, or rare scar following cryotherapy. Recommend Vaseline ointment to treated areas while healing.     Return in about 1 year (around 02/02/2025) for TBSE - hx of SCC and BCC.  LILLETTE Rosina Mayans, CMA, am acting as scribe for Rexene Rattler, MD .   Documentation: I have reviewed the above documentation for accuracy and completeness, and I agree with the above.  Rexene Rattler, MD

## 2024-02-04 ENCOUNTER — Other Ambulatory Visit: Payer: Self-pay

## 2024-02-11 ENCOUNTER — Ambulatory Visit: Admitting: Physician Assistant

## 2024-02-11 DIAGNOSIS — N3946 Mixed incontinence: Secondary | ICD-10-CM

## 2024-02-11 NOTE — Progress Notes (Signed)
 PTNS  Session # 41  Health & Social Factors: no change Caffeine: 1 Alcohol: 0 Daytime voids #per day: 8 Night-time voids #per night: 6 Urgency: strong Incontinence Episodes #per day: <1 Ankle used: right Treatment Setting: 9 Feeling/ Response: sensory Comments: Patient tolerated well.  Performed By: Nyrie Sigal, PA-C   Follow Up: Return in about 4 weeks (around 03/10/2024) for PTNS maintenance.

## 2024-03-12 ENCOUNTER — Ambulatory Visit
Admission: RE | Admit: 2024-03-12 | Discharge: 2024-03-12 | Disposition: A | Discharge status: Home / Self Care | Source: Ambulatory Visit | Attending: Physician Assistant | Admitting: Physician Assistant

## 2024-03-12 ENCOUNTER — Other Ambulatory Visit: Payer: Self-pay | Admitting: Physician Assistant

## 2024-03-12 DIAGNOSIS — S0990XA Unspecified injury of head, initial encounter: Secondary | ICD-10-CM
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
## 2024-03-15 ENCOUNTER — Ambulatory Visit: Admitting: Physician Assistant

## 2024-03-22 ENCOUNTER — Ambulatory Visit: Admitting: Physician Assistant

## 2024-04-12 ENCOUNTER — Ambulatory Visit: Admitting: Physician Assistant

## 2024-04-23 ENCOUNTER — Other Ambulatory Visit

## 2024-05-03 ENCOUNTER — Inpatient Hospital Stay

## 2024-05-03 ENCOUNTER — Inpatient Hospital Stay: Admitting: Oncology

## 2024-05-05 ENCOUNTER — Ambulatory Visit: Admitting: Radiation Oncology

## 2024-08-24 ENCOUNTER — Encounter: Admitting: Dermatology
